# Patient Record
Sex: Male | Born: 1940 | Race: White | Hispanic: No | Smoking: Current every day smoker
Health system: Southern US, Community
[De-identification: ages and names within clinical notes are randomized; demographics above are authoritative.]

## PROBLEM LIST (undated history)

## (undated) DIAGNOSIS — K5790 Diverticulosis of intestine, part unspecified, without perforation or abscess without bleeding: Secondary | ICD-10-CM

## (undated) DIAGNOSIS — I10 Essential (primary) hypertension: Secondary | ICD-10-CM

## (undated) DIAGNOSIS — I509 Heart failure, unspecified: Secondary | ICD-10-CM

## (undated) DIAGNOSIS — I714 Abdominal aortic aneurysm, without rupture: Secondary | ICD-10-CM

## (undated) DIAGNOSIS — I2510 Atherosclerotic heart disease of native coronary artery without angina pectoris: Secondary | ICD-10-CM

## (undated) DIAGNOSIS — E785 Hyperlipidemia, unspecified: Secondary | ICD-10-CM

## (undated) DIAGNOSIS — J439 Emphysema, unspecified: Secondary | ICD-10-CM

## (undated) DIAGNOSIS — J1282 Pneumonia due to coronavirus disease 2019: Secondary | ICD-10-CM

## (undated) DIAGNOSIS — G4733 Obstructive sleep apnea (adult) (pediatric): Secondary | ICD-10-CM

## (undated) DIAGNOSIS — N281 Cyst of kidney, acquired: Secondary | ICD-10-CM

## (undated) DIAGNOSIS — S81802A Unspecified open wound, left lower leg, initial encounter: Secondary | ICD-10-CM

## (undated) DIAGNOSIS — I700 Atherosclerosis of aorta: Secondary | ICD-10-CM

## (undated) DIAGNOSIS — I4891 Unspecified atrial fibrillation: Secondary | ICD-10-CM

## (undated) DIAGNOSIS — I739 Peripheral vascular disease, unspecified: Secondary | ICD-10-CM

## (undated) DIAGNOSIS — J449 Chronic obstructive pulmonary disease, unspecified: Secondary | ICD-10-CM

## (undated) DIAGNOSIS — K760 Fatty (change of) liver, not elsewhere classified: Secondary | ICD-10-CM

## (undated) DIAGNOSIS — I517 Cardiomegaly: Secondary | ICD-10-CM

## (undated) DIAGNOSIS — N189 Chronic kidney disease, unspecified: Secondary | ICD-10-CM

## (undated) DIAGNOSIS — E669 Obesity, unspecified: Secondary | ICD-10-CM

## (undated) DIAGNOSIS — I7143 Infrarenal abdominal aortic aneurysm, without rupture: Secondary | ICD-10-CM

## (undated) DIAGNOSIS — C801 Malignant (primary) neoplasm, unspecified: Secondary | ICD-10-CM

## (undated) DIAGNOSIS — Z7901 Long term (current) use of anticoagulants: Secondary | ICD-10-CM

## (undated) HISTORY — DX: Unspecified atrial fibrillation: I48.91

## (undated) HISTORY — PX: APPENDECTOMY: SHX54

## (undated) HISTORY — DX: Heart failure, unspecified: I50.9

## (undated) HISTORY — DX: Obstructive sleep apnea (adult) (pediatric): G47.33

## (undated) HISTORY — DX: Essential (primary) hypertension: I10

## (undated) HISTORY — DX: Pneumonia due to coronavirus disease 2019: J12.82

## (undated) HISTORY — DX: Emphysema, unspecified: J43.9

## (undated) HISTORY — DX: Obesity, unspecified: E66.9

## (undated) HISTORY — DX: Unspecified open wound, left lower leg, initial encounter: S81.802A

## (undated) HISTORY — PX: OTHER SURGICAL HISTORY: SHX169

---

## 2014-08-02 ENCOUNTER — Ambulatory Visit: Payer: Self-pay | Admitting: Podiatry

## 2014-08-04 ENCOUNTER — Ambulatory Visit (INDEPENDENT_AMBULATORY_CARE_PROVIDER_SITE_OTHER): Payer: Medicare Other

## 2014-08-04 ENCOUNTER — Ambulatory Visit (INDEPENDENT_AMBULATORY_CARE_PROVIDER_SITE_OTHER): Payer: Medicare Other | Admitting: Podiatry

## 2014-08-04 ENCOUNTER — Encounter: Payer: Self-pay | Admitting: Podiatry

## 2014-08-04 VITALS — BP 132/61 | HR 70 | Resp 18 | Ht 74.0 in | Wt 300.0 lb

## 2014-08-04 DIAGNOSIS — M2041 Other hammer toe(s) (acquired), right foot: Secondary | ICD-10-CM

## 2014-08-04 DIAGNOSIS — Q828 Other specified congenital malformations of skin: Secondary | ICD-10-CM

## 2014-08-04 DIAGNOSIS — M2040 Other hammer toe(s) (acquired), unspecified foot: Secondary | ICD-10-CM

## 2014-08-04 NOTE — Progress Notes (Signed)
   Subjective:    Patient ID: Stanley Marks, male    DOB: 07/05/1941, 73 y.o.   MRN: [id number]  HPI Comments: Callus on the bottom of the right foot, it has been there for years. It is has been cut on several occasions. It is very sore and would like it cut out for good . Right plantar 4th met.       Review of Systems  Cardiovascular: Positive for leg swelling.  All other systems reviewed and are negative.      Objective:   Physical Exam: I have reviewed his past medical history medications allergies surgeries social history and review of systems. Pulses are strongly palpable bilateral. Neurologic sensorium is intact per since once the monofilament. Bilateral pitting edema is noted. Deep tendon reflexes are intact and muscle strength is 5 over 5 dorsiflexors plantar flexors inverters everters all intrinsic musculature is intact. Orthopedic evaluation demonstrates all joints distal to the ankle a full range of motion without crepitation. All flexible hammertoe deformities bilateral. Radiographic evaluation doesn't demonstrate any type of osseous abnormalities here. Cutaneous evaluation does demonstrate a reactive porokeratosis sub-fourth metatarsal head right foot. Whitley painful palpation this is not verrucoid nature.        Assessment & Plan:  Assessment: Porokeratosis solitary right foot.  Plan: Manual debridement as well as chemical debridement followup with him in 6 weeks

## 2014-08-24 DIAGNOSIS — C4491 Basal cell carcinoma of skin, unspecified: Secondary | ICD-10-CM

## 2014-08-24 HISTORY — DX: Basal cell carcinoma of skin, unspecified: C44.91

## 2014-09-15 ENCOUNTER — Encounter: Payer: Self-pay | Admitting: Podiatry

## 2014-09-15 ENCOUNTER — Ambulatory Visit (INDEPENDENT_AMBULATORY_CARE_PROVIDER_SITE_OTHER): Payer: Medicare Other | Admitting: Podiatry

## 2014-09-15 VITALS — BP 120/60 | HR 70 | Resp 16

## 2014-09-15 DIAGNOSIS — D2370 Other benign neoplasm of skin of unspecified lower limb, including hip: Secondary | ICD-10-CM

## 2014-09-15 DIAGNOSIS — Q828 Other specified congenital malformations of skin: Secondary | ICD-10-CM

## 2014-09-15 NOTE — Progress Notes (Signed)
He presents today for followup of a porokeratotic lesion plantar aspect of the right foot.  Objective: Vital signs are stable he is alert and oriented x3. Pulses are palpable. Porokeratotic lesion plantar aspect fourth metatarsal head of the right foot.  Assessment: Porokeratosis right foot.  Plan: Mechanical debridement and then we applied salicylic acid under occlusion for chemical debridement I will followup with him in a few weeks.

## 2014-11-15 ENCOUNTER — Ambulatory Visit (INDEPENDENT_AMBULATORY_CARE_PROVIDER_SITE_OTHER): Payer: Medicare Other | Admitting: Podiatry

## 2014-11-15 VITALS — BP 96/63 | HR 100 | Resp 16

## 2014-11-15 DIAGNOSIS — Q828 Other specified congenital malformations of skin: Secondary | ICD-10-CM

## 2014-11-15 DIAGNOSIS — D239 Other benign neoplasm of skin, unspecified: Secondary | ICD-10-CM

## 2014-11-15 NOTE — Progress Notes (Signed)
He presents today with chief complaint of painful lesion plantar aspect of the right foot. He states this seems to be getting much better.  Objective: Vital signs are stable he is alert and oriented 3. No erythema or edema cellulitis drainage or odor. Reactive hyperkeratosis of third metatarsophalangeal joint appears to be resolving nicely. No signs of infection.  Assessment: Well-healing porokeratotic lesion plantar aspect right foot.  Plan: Debrided reactive hyperkeratosis mechanically today also applied under occlusion salicylic acid to be left on for 3 days without washing off and then wash off thoroughly should signs of infection develop he will notify us immediately otherwise I will follow-up with him in 6-8 weeks.

## 2014-12-08 DIAGNOSIS — Z85828 Personal history of other malignant neoplasm of skin: Secondary | ICD-10-CM | POA: Insufficient documentation

## 2015-01-17 ENCOUNTER — Encounter: Payer: Self-pay | Admitting: Podiatry

## 2015-01-17 ENCOUNTER — Ambulatory Visit (INDEPENDENT_AMBULATORY_CARE_PROVIDER_SITE_OTHER): Payer: Medicare Other | Admitting: Podiatry

## 2015-01-17 VITALS — BP 144/86 | HR 70 | Resp 12

## 2015-01-17 DIAGNOSIS — Q828 Other specified congenital malformations of skin: Secondary | ICD-10-CM | POA: Diagnosis not present

## 2015-01-17 DIAGNOSIS — M2041 Other hammer toe(s) (acquired), right foot: Secondary | ICD-10-CM | POA: Diagnosis not present

## 2015-01-17 DIAGNOSIS — C4492 Squamous cell carcinoma of skin, unspecified: Secondary | ICD-10-CM

## 2015-01-17 HISTORY — DX: Squamous cell carcinoma of skin, unspecified: C44.92

## 2015-01-17 NOTE — Progress Notes (Signed)
He presents today for follow-up of the painful porokeratosis plantar aspect of the forefoot right. He states it is doing much better.  Objective: Vital signs are stable he is alert and oriented 3. Pulses are palpable. Porokeratotic lesion appears to be healing appropriately. Mild reactive hyperkeratosis.  Assessment: Porokeratosis plantar aspect right foot.  Plan: Debridement of reactive hyperkeratosis with debridement as well as chemical destruction he was given both oral and written home-going instructions for the care of this chemical application I will follow-up with him in a few months if necessary.

## 2015-08-06 ENCOUNTER — Emergency Department
Admission: EM | Admit: 2015-08-06 | Discharge: 2015-08-07 | Disposition: A | Discharge status: Home / Self Care | Payer: Medicare Other | Attending: Emergency Medicine | Admitting: Emergency Medicine

## 2015-08-06 ENCOUNTER — Encounter: Payer: Self-pay | Admitting: Emergency Medicine

## 2015-08-06 DIAGNOSIS — I10 Essential (primary) hypertension: Secondary | ICD-10-CM | POA: Insufficient documentation

## 2015-08-06 DIAGNOSIS — R2243 Localized swelling, mass and lump, lower limb, bilateral: Secondary | ICD-10-CM | POA: Diagnosis present

## 2015-08-06 DIAGNOSIS — R238 Other skin changes: Secondary | ICD-10-CM

## 2015-08-06 DIAGNOSIS — E119 Type 2 diabetes mellitus without complications: Secondary | ICD-10-CM | POA: Insufficient documentation

## 2015-08-06 DIAGNOSIS — L909 Atrophic disorder of skin, unspecified: Secondary | ICD-10-CM

## 2015-08-06 DIAGNOSIS — Z720 Tobacco use: Secondary | ICD-10-CM | POA: Insufficient documentation

## 2015-08-06 DIAGNOSIS — Z79899 Other long term (current) drug therapy: Secondary | ICD-10-CM | POA: Diagnosis not present

## 2015-08-06 DIAGNOSIS — I878 Other specified disorders of veins: Secondary | ICD-10-CM | POA: Insufficient documentation

## 2015-08-06 HISTORY — DX: Peripheral vascular disease, unspecified: I73.9

## 2015-08-06 NOTE — ED Notes (Signed)
Patient presents to the ED with left leg pain and weeping blisters on lower left leg.  Patient has a dressing covering blisters at this time.  Patient denies any injury to left leg.

## 2015-08-07 ENCOUNTER — Encounter: Payer: Self-pay | Admitting: Emergency Medicine

## 2015-08-07 DIAGNOSIS — I878 Other specified disorders of veins: Secondary | ICD-10-CM | POA: Diagnosis not present

## 2015-08-07 LAB — CBC WITH DIFFERENTIAL/PLATELET
BASOS ABS: 0.1 10*3/uL (ref 0–0.1)
Basophils Relative: 1 %
EOS PCT: 2 %
Eosinophils Absolute: 0.2 10*3/uL (ref 0–0.7)
HCT: 45.8 % (ref 40.0–52.0)
Hemoglobin: 15.1 g/dL (ref 13.0–18.0)
LYMPHS ABS: 1.5 10*3/uL (ref 1.0–3.6)
LYMPHS PCT: 15 %
MCH: 28.7 pg (ref 26.0–34.0)
MCHC: 32.9 g/dL (ref 32.0–36.0)
MCV: 87.3 fL (ref 80.0–100.0)
MONO ABS: 0.5 10*3/uL (ref 0.2–1.0)
Monocytes Relative: 6 %
Neutro Abs: 7.5 10*3/uL — ABNORMAL HIGH (ref 1.4–6.5)
Neutrophils Relative %: 76 %
PLATELETS: 181 10*3/uL (ref 150–440)
RBC: 5.25 MIL/uL (ref 4.40–5.90)
RDW: 16 % — AB (ref 11.5–14.5)
WBC: 9.8 10*3/uL (ref 3.8–10.6)

## 2015-08-07 LAB — COMPREHENSIVE METABOLIC PANEL
ALBUMIN: 3.4 g/dL — AB (ref 3.5–5.0)
ALT: 23 U/L (ref 17–63)
AST: 23 U/L (ref 15–41)
Alkaline Phosphatase: 65 U/L (ref 38–126)
Anion gap: 9 (ref 5–15)
BILIRUBIN TOTAL: 0.7 mg/dL (ref 0.3–1.2)
BUN: 18 mg/dL (ref 6–20)
CO2: 29 mmol/L (ref 22–32)
CREATININE: 1.17 mg/dL (ref 0.61–1.24)
Calcium: 8.9 mg/dL (ref 8.9–10.3)
Chloride: 104 mmol/L (ref 101–111)
GFR calc Af Amer: >60 mL/min (ref >60)
GLUCOSE: 115 mg/dL — AB (ref 65–99)
POTASSIUM: 2.8 mmol/L — AB (ref 3.5–5.1)
Sodium: 142 mmol/L (ref 135–145)
TOTAL PROTEIN: 6.6 g/dL (ref 6.5–8.1)

## 2015-08-07 MED ORDER — POTASSIUM CHLORIDE CRYS ER 20 MEQ PO TBCR
EXTENDED_RELEASE_TABLET | ORAL | Status: AC
  Administered 2015-08-07: 40 meq via ORAL
  Filled 2015-08-07: qty 2

## 2015-08-07 MED ORDER — ZINC OXIDE 12.8 % EX OINT
TOPICAL_OINTMENT | CUTANEOUS | Status: DC | PRN
  Filled 2015-08-07: qty 56.7

## 2015-08-07 MED ORDER — POTASSIUM CHLORIDE CRYS ER 20 MEQ PO TBCR
40.0000 meq | EXTENDED_RELEASE_TABLET | Freq: Once | ORAL | Status: AC
  Administered 2015-08-07: 40 meq via ORAL

## 2015-08-07 NOTE — ED Notes (Signed)
Pt and spouse verbalize understanding of una boot care and follow up.

## 2015-08-07 NOTE — ED Provider Notes (Signed)
[facility] Emergency Department Provider Note     ____________________________________________  Time seen: Approximately 12:55 AM  I have reviewed the triage vital signs and the nursing notes.   HISTORY  Chief Complaint Leg Pain    HPI Stanley Marks is a 74 y.o. male who reports his legs have been swelling for many years. Left leg began to have skin breakdown oozing couple days ago. His wife wrapped it up after covering with Vaseline. He reports is somewhat painful. He's been raising his legs. But he's run out of his Prilosec lactone and Bumex. Does not have anybody to get them refilled. Patient sees Dr. Brynda Greathouse and will follow-up with Dr. Brynda Greathouse.   Past Medical History  Diagnosis Date  . Hypertension   . Emphysema of lung   . Diabetes mellitus without complication   . PVD (peripheral vascular disease)     There are no active problems to display for this patient.   Past Surgical History  Procedure Laterality Date  . Arm surgery    . Appendectomy      Current Outpatient Rx  Name  Route  Sig  Dispense  Refill  . lisinopril (PRINIVIL,ZESTRIL) 10 MG tablet   Oral   Take 10 mg by mouth daily.         . metolazone (ZAROXOLYN) 5 MG tablet   Oral   Take 5 mg by mouth daily.         Marland Kitchen spironolactone (ALDACTONE) 25 MG tablet   Oral   Take 25 mg by mouth daily.         . bumetanide (BUMEX) 1 MG tablet   Oral   Take 1 mg by mouth daily.           Allergies Review of patient's allergies indicates no known allergies.  History reviewed. No pertinent family history.  Social History Social History  Substance Use Topics  . Smoking status: Current Every Day Smoker -- 0.00 packs/day  . Smokeless tobacco: Never Used  . Alcohol Use: No    Review of Systems Constitutional: No fever/chills Eyes: No visual changes. ENT: No sore throat. Cardiovascular: Denies chest pain. Respiratory: Denies shortness of breath. Gastrointestinal: No  abdominal pain.  No nausea, no vomiting.  No diarrhea.  No constipation. Genitourinary: Negative for dysuria. Musculoskeletal: Negative for back pain. Skin: Venous stasis changes in both legs Neurological: Negative for headaches, focal weakness or numbness.  10-point ROS otherwise negative.  ____________________________________________   PHYSICAL EXAM:  VITAL SIGNS: ED Triage Vitals  Enc Vitals Group     BP 08/06/15 2311 114/66 mmHg     Pulse Rate 08/06/15 2311 74     Resp 08/06/15 2311 24     Temp 08/06/15 2311 97.8 F (36.6 C)     Temp Source 08/06/15 2311 Oral     SpO2 08/06/15 2311 96 %     Weight 08/06/15 2311 320 lb (145.151 kg)     Height 08/06/15 2311 6\' 1"  (1.854 m)     Head Cir --      Peak Flow --      Pain Score 08/06/15 2312 4     Pain Loc --      Pain Edu? --      Excl. in [place]? --     Constitutional: Alert and oriented. Well appearing and in no acute distress. Eyes: Conjunctivae are normal. PERRL. EOMI. Head: Atraumatic. Nose: No congestion/rhinnorhea. Mouth/Throat: Mucous membranes are moist.  Oropharynx non-erythematous. Neck: No stridor. }Cardiovascular:  Normal rate, regular rhythm. Grossly normal heart sounds.  Good peripheral circulation. Respiratory: Normal respiratory effort.  No retractions. Lungs CTAB. Gastrointestinal: Soft and nontender. No distention. No abdominal bruits. No CVA tenderness. Musculoskeletal: No lower extremity tenderness nor edema.  No joint effusions. Neurologic:  Normal speech and language. No gross focal neurologic deficits are appreciated. No gait instability. Skin:  Venous stasis changes in both legs. There is breakdown of the skin with oozing of serous serous fluid from multiple spots on the left leg. There is no cellulitis leg is not warm or or redder than the other leg. Psychiatric: Mood and affect are normal. Speech and behavior are normal.  ____________________________________________   LABS (all labs ordered are  listed, but only abnormal results are displayed)  Labs Reviewed  COMPREHENSIVE METABOLIC PANEL - Abnormal; Notable for the following:    Potassium 2.8 (*)    Glucose, Bld 115 (*)    Albumin 3.4 (*)    All other components within normal limits  CBC WITH DIFFERENTIAL/PLATELET - Abnormal; Notable for the following:    RDW 16.0 (*)    Neutro Abs 7.5 (*)    All other components within normal limits   ____________________________________________  EKG   ____________________________________________  RADIOLOGY   ____________________________________________   PROCEDURES  Zinc oxide and Unna boot applied to the patient's left leg by the nurse and covered with a Ace wrap. Patient will continue to keep the leg elevated. Patient given potassium for his low potassium and his blood work. His medications were refilled. ____________________________________________   INITIAL IMPRESSION / ASSESSMENT AND PLAN / ED COURSE  Pertinent labs & imaging results that were available during my care of the patient were reviewed by me and considered in my medical decision making (see chart for details).   ____________________________________________   FINAL CLINICAL IMPRESSION(S) / ED DIAGNOSES  Final diagnoses:  Skin breakdown      Nena Polio, MD 08/07/15 [phone]

## 2015-08-07 NOTE — Discharge Instructions (Signed)
Continue to keep your leg elevated. Wear the [employer]. Follow-up with Dr. Brynda Greathouse and approximately one week. Never to come back if she develops a fever or worse leg pain. Workup there is any foul odor from the leg under the [employer]. I did not see any infection at the present time but is possible that this could develop. Start taking your Bumex and spironolactone. Be sure to follow-up with Dr. Brynda Greathouse for these as well. Her potassium was low today. Times the Bumex can give you a low potassium. This parenteral lactone exposed to help with this but it has not at present. I would eat some oranges or bananas for the next couple days to make sure he potassium stays up. Alternatively you could drink a candidate for 2-3 days of the green low-sodium V8. This has a good bit of potassium in it. I would not do this for more than 3 days. I would make sure that she follow with Dr. Brynda Greathouse and make sure that he checks potassium make sure it has not gone to high or too low. Please feel free to show him this note so he understands what I done.

## 2015-08-17 ENCOUNTER — Encounter: Payer: Medicare Other | Attending: Surgery | Admitting: Surgery

## 2015-08-17 DIAGNOSIS — I872 Venous insufficiency (chronic) (peripheral): Secondary | ICD-10-CM | POA: Diagnosis not present

## 2015-08-17 DIAGNOSIS — L97221 Non-pressure chronic ulcer of left calf limited to breakdown of skin: Secondary | ICD-10-CM | POA: Diagnosis present

## 2015-08-17 DIAGNOSIS — J449 Chronic obstructive pulmonary disease, unspecified: Secondary | ICD-10-CM | POA: Diagnosis not present

## 2015-08-17 DIAGNOSIS — I83222 Varicose veins of left lower extremity with both ulcer of calf and inflammation: Secondary | ICD-10-CM | POA: Diagnosis not present

## 2015-08-17 DIAGNOSIS — F17200 Nicotine dependence, unspecified, uncomplicated: Secondary | ICD-10-CM | POA: Diagnosis not present

## 2015-08-17 DIAGNOSIS — M868X6 Other osteomyelitis, lower leg: Secondary | ICD-10-CM | POA: Diagnosis not present

## 2015-08-18 NOTE — Progress Notes (Signed)
Stanley Marks, Stanley Marks ([id number]) Visit Report for 08/17/2015 Chief Complaint Document Details Patient Name: Stanley Marks, Stanley Marks Date of Service: 08/17/2015 10:15 AM Medical Record Number: [id number] Patient Account Number: [id number] Date of Birth/Sex: 1941/12/07 (74 y.o. Male) Treating RN: Cornell Barman Primary Care Physician: Nicky Pugh Other Clinician: Referring Physician: Nicky Pugh Treating Physician/Extender: BURNS III, Charlean Sanfilippo in Treatment: 0 Information Obtained from: Patient Chief Complaint Left calf ulcerations. Electronic Signature(s) Signed: 08/17/2015 4:02:17 PM By: Loletha Grayer MD Entered By: Loletha Grayer on 08/17/2015 13:41:13 Stanley Marks ([id number]) -------------------------------------------------------------------------------- HPI Details Patient Name: Stanley Marks Date of Service: 08/17/2015 10:15 AM Medical Record Number: [id number] Patient Account Number: [id number] Date of Birth/Sex: [birth year] (74 y.o. Male) Treating RN: Cornell Barman Primary Care Physician: Nicky Pugh Other Clinician: Referring Physician: Nicky Pugh Treating Physician/Extender: BURNS III, WALTER Weeks in Treatment: 0 History of Present Illness HPI Description: Pleasant 74 year old with history of chronic venous insufficiency and hypertension. No History of diabetes or peripheral arterial disease. He says that he stopped taking his "fluid pill" recently so that he wouldn't have to urinate as often. He subsequently developed bilateral lower extremity swelling and left calf ulcerations in late August 2016. Status post bilateral lower extremity endovenous ablation over 5 years ago in [place]. He does not regularly wear compression stockings. Denies any history of DVT. He is ambulating per his baseline. No claudication or rest pain. Not performing any dressing changes. No antibiotics. No fever or chills. Minimal drainage. Electronic Signature(s) Signed: 08/17/2015 4:02:17 PM By: Loletha Grayer MD Entered By: Loletha Grayer on 08/17/2015 13:43:54 Stanley Marks ([id number]) -------------------------------------------------------------------------------- Physical Exam Details Patient Name: Stanley Marks Date of Service: 08/17/2015 10:15 AM Medical Record Number: [id number] Patient Account Number: [id number] Date of Birth/Sex: [birth year] (74 y.o. Male) Treating RN: Cornell Barman Primary Care Physician: Nicky Pugh Other Clinician: Referring Physician: Nicky Pugh Treating Physician/Extender: BURNS III, WALTER Weeks in Treatment: 0 Constitutional . Pulse regular. Respirations normal and unlabored. Afebrile. Marland Kitchen Respiratory WNL. No retractions.. Cardiovascular Pedal Pulses WNL. Integumentary (Hair, Skin) .Marland Kitchen Neurological Sensation normal to touch, pin,and vibration. Psychiatric Judgement and insight Intact.. Oriented times 3.. No evidence of depression, anxiety, or agitation.. Notes Left calf ulcerations. Full-thickness. Biofilm wiped free with gauze. Healthy underlying, bleeding granulation tissue. No debridement required. Minimal surrounding erythema. 1+ pitting edema. 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 Palpable DP bilaterally. Left ABI not obtained secondary to painful ulcerations. Right ABI 1.37. Electronic Signature(s) Signed: 08/17/2015 4:02:17 PM By: Loletha Grayer MD Entered By: Loletha Grayer on 08/17/2015 13:45:26 Stanley Marks ([id number]) -------------------------------------------------------------------------------- Physician Orders Details Patient Name: Stanley Marks Date of Service: 08/17/2015 10:15 AM Medical Record Number: [id number] Patient Account Number: [id number] Date of Birth/Sex: Jun 18, 1941 (74 y.o. Male) Treating RN: Cornell Barman Primary Care Physician: Nicky Pugh Other Clinician: Referring Physician: Nicky Pugh Treating Physician/Extender: BURNS III, Charlean Sanfilippo in Treatment: 0 Verbal / Phone Orders:  Yes Clinician: Cornell Barman Read Back and Verified: Yes Diagnosis Coding Wound Cleansing Wound #1 Left,Anterior Lower Leg o Clean wound with Normal Saline. Wound #2 Left,Posterior Lower Leg o Clean wound with Normal Saline. Anesthetic Wound #1 Left,Anterior Lower Leg o Topical Lidocaine 4% cream applied to wound bed prior to debridement Wound #2 Left,Posterior Lower Leg o Topical Lidocaine 4% cream applied to wound bed prior to debridement Primary Wound Dressing Wound #1 Left,Anterior Lower Leg o Prisma Ag Wound #2 Left,Posterior Lower Leg o Prisma Ag Secondary Dressing Wound #1 Left,Anterior Lower Leg o ABD pad Wound #2 Left,Posterior Lower Leg o  ABD pad Dressing Change Frequency Wound #1 Left,Anterior Lower Leg o Change dressing every week Wound #2 Left,Posterior Lower Leg o Change dressing every week o Other: - Nurse visit Friday or Monday Stanley Marks, Stanley Marks ([id number]) Follow-up Appointments Wound #1 Left,Anterior Lower Leg o Return Appointment in 1 week. Wound #2 Left,Posterior Lower Leg o Return Appointment in 1 week. Edema Control Wound #1 Left,Anterior Lower Leg o 2 Layer Lite Compression System - Left Lower Extremity Wound #2 Left,Posterior Lower Leg o 2 Layer Lite Compression System - Left Lower Extremity Electronic Signature(s) Signed: 08/17/2015 4:02:17 PM By: Loletha Grayer MD Signed: 08/17/2015 4:22:57 PM By: Gretta Cool RN, BSN, Kim RN, BSN Entered By: Gretta Cool, RN, BSN, Kim on 08/17/2015 11:12:41 Stanley Marks ([id number]) -------------------------------------------------------------------------------- Problem List Details Patient Name: Stanley Marks Date of Service: 08/17/2015 10:15 AM Medical Record Number: [id number] Patient Account Number: [id number] Date of Birth/Sex: [birth year] (74 y.o. 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 Male) Treating RN: Cornell Barman Primary Care Physician: Nicky Pugh Other Clinician: Referring Physician: Nicky Pugh Treating Physician/Extender: BURNS III, Charlean Sanfilippo in Treatment: 0 Subjective Chief Complaint Information obtained from Patient Left calf ulcerations. History of Present Illness (HPI) Pleasant 74 year old with history of chronic venous insufficiency and hypertension. No History of diabetes or peripheral arterial disease. He says that he stopped taking his "fluid pill" recently so that he wouldn't have to urinate as often. He subsequently developed bilateral lower extremity swelling and left calf ulcerations in late August 2016. Status post bilateral lower extremity endovenous ablation over 5 years ago in [place]. He does not regularly wear compression stockings. Denies any history of DVT. He is ambulating per his baseline. No claudication or rest pain. Not performing any dressing changes. No antibiotics. No fever or chills. Minimal drainage. Wound History Patient presents with 2 open wounds that have been present for approximately August 27. Patient has been treating wounds in the  following manner: vasaline, peroxide. Laboratory tests have not been performed in the last month. Patient reportedly has not tested positive for an antibiotic resistant organism. Patient reportedly has not tested positive for osteomyelitis. Patient reportedly has had testing performed to evaluate circulation in the legs. Patient History Information obtained from Patient. Allergies No Known Drug Allergies Family History Heart Disease - Mother, Siblings, Hypertension - Mother, Siblings, No family history of Cancer, Diabetes, Kidney Disease, Lung Disease, Seizures, Stroke, Thyroid Problems. Social History Current every day smoker, Marital Status - Married, Alcohol Use - Never, Drug Use - No History, Caffeine Use - Daily. Medical History HIAWATHA, MERRIOTT ([id number]) Eyes Patient has history of Cataracts Denies history of Glaucoma, Optic Neuritis Ear/Nose/Mouth/Throat Denies history of Chronic sinus problems/congestion Hematologic/Lymphatic Denies history of Anemia, Hemophilia, Human Immunodeficiency Virus, Lymphedema, Sickle Cell Disease Respiratory Patient has history of Chronic Obstructive Pulmonary Disease (COPD) Denies history of Aspiration, Asthma, Pneumothorax, Sleep Apnea, Tuberculosis Cardiovascular Patient has history of Peripheral Venous  Disease Denies history of Angina, Arrhythmia, Congestive Heart Failure, Coronary Artery Disease, Deep Vein Thrombosis, Hypertension, Hypotension, Myocardial Infarction, Peripheral Arterial Disease, Phlebitis, Vasculitis Gastrointestinal Denies history of Cirrhosis , Colitis, Crohn s Endocrine Denies history of Type I Diabetes Genitourinary Denies history of End Stage Renal Disease Immunological Denies history of Lupus Erythematosus, Raynaud s, Scleroderma Integumentary (Skin) Denies history of History of Burn, History of pressure wounds Musculoskeletal Patient has history of Osteomyelitis - right shin Denies history of Gout, Rheumatoid  Arthritis, Osteoarthritis Neurologic Denies history of Dementia, Neuropathy, Quadriplegia, Paraplegia, Seizure Disorder Oncologic Denies history of Received Chemotherapy, Received Radiation Psychiatric Denies history of Anorexia/bulimia, Confinement Anxiety Medical And Surgical History Notes Constitutional Symptoms (General Health) Fluid swelling; HTN medicarion in past; appendicitis; hepatitis Oncologic Skin Cancer Review of Systems (ROS) Constitutional Symptoms (General Health) The patient has no complaints or symptoms. Eyes The patient has no complaints or symptoms. Ear/Nose/Mouth/Throat The patient has no complaints or symptoms. Hematologic/Lymphatic The patient has no complaints or symptoms. Respiratory Stanley Marks, Stanley Marks ([id number]) Complains or has symptoms of Shortness of Breath. Cardiovascular The patient has no complaints or symptoms. Gastrointestinal The patient has no complaints or symptoms. Endocrine The patient has no complaints or symptoms. Genitourinary The patient has no complaints or symptoms. Immunological The patient has no complaints or symptoms. Integumentary (Skin) Complains or has symptoms of Wounds, Bleeding or bruising tendency, Swelling. Denies complaints or symptoms of Breakdown. Musculoskeletal The patient has no complaints or symptoms. Neurologic The patient has no complaints or symptoms. Oncologic The patient has no complaints or symptoms. Psychiatric The patient has no complaints or symptoms. Objective Constitutional Pulse regular. Respirations normal and unlabored. Afebrile. Vitals Time Taken: 10:24 AM, Height: 73 in, Source: Stated, Weight: 314.5 lbs, Source: Measured, BMI: 41.5, Temperature: 97.7 F, Pulse: 64 bpm, Respiratory Rate: 18 breaths/min, Blood Pressure: 146/77 mmHg. Respiratory WNL. No retractions.. Cardiovascular Pedal Pulses WNL. Neurological Sensation normal to touch, pin,and vibration. Psychiatric Judgement and  insight Intact.. Oriented times 3.. No evidence of depression, anxiety, or agitation.Stanley Marks, Stanley Marks ([id number]) General Notes: Left calf ulcerations. Full-thickness. Biofilm wiped free with gauze. Healthy underlying, bleeding granulation tissue. No debridement required. Minimal surrounding erythema. 1+ pitting edema. 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 Palpable DP bilaterally. Left ABI not obtained secondary to painful ulcerations. Right ABI 1.37. Integumentary (Hair, Skin) Wound #1 status is Open. Original cause of wound was Blister. The wound is located on the Left,Anterior Lower Leg. The wound measures 3cm length x 4cm width x 0.1cm depth; 9.425cm^2 area and 0.942cm^3 volume. The wound is limited to skin breakdown. There is a small amount of serous drainage noted. The wound margin is indistinct and nonvisible. There is medium (34-66%) red granulation within the wound bed. There is a small (1-33%) amount of necrotic tissue within the wound bed including Eschar. The periwound skin appearance exhibited: Moist. The periwound skin appearance did not exhibit: Callus, Crepitus, Excoriation, Fluctuance, Friable, Induration, Localized Edema, Rash, Scarring, Dry/Scaly, Maceration, Atrophie Blanche, Cyanosis, Ecchymosis, Hemosiderin Staining, Mottled, Pallor, Rubor, Erythema. Wound #2 status is Open. Original cause of wound was Blister. The wound is located on the Left,Posterior Lower Leg. The wound measures 4.5cm length x 3.5cm width x 0.1cm depth; 12.37cm^2 area and 1.237cm^3 volume. The wound is limited to skin breakdown. There is a large amount of serous drainage noted. The wound margin is indistinct and nonvisible. There is small (1-33%) red granulation within the wound bed. There is no necrotic tissue within the wound bed. The periwound skin appearance exhibited: Moist. The  periwound skin appearance did not exhibit: Callus, Crepitus, Excoriation, Fluctuance,  Friable, Induration, Localized Edema, Rash, Scarring, Dry/Scaly, Maceration, Atrophie Blanche, Cyanosis, Ecchymosis, Hemosiderin Staining, Mottled, Pallor, Rubor, Erythema. 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 Plan Wound Cleansing: Wound #1 Left,Anterior Lower Leg: Stanley Marks, Stanley Marks ([id number]) Clean wound with Normal Saline. Wound #2 Left,Posterior Lower Leg: Clean wound with Normal Saline. Anesthetic: Wound #1 Left,Anterior Lower Leg: Topical Lidocaine 4% cream applied to wound bed prior to debridement Wound #2 Left,Posterior Lower Leg: Topical Lidocaine 4% cream applied to wound bed prior to debridement Primary Wound Dressing: Wound #1 Left,Anterior Lower Leg: Prisma Ag Wound #2 Left,Posterior Lower Leg: Prisma Ag Secondary Dressing: Wound #1 Left,Anterior Lower Leg: ABD pad Wound #2 Left,Posterior Lower Leg: ABD pad Dressing Change Frequency: Wound #1 Left,Anterior Lower Leg: Change dressing every week Wound #2 Left,Posterior Lower Leg: Change dressing every week Other: - Nurse visit Friday or Monday Follow-up Appointments: Wound #1 Left,Anterior Lower Leg: Return Appointment in 1 week. Wound #2 Left,Posterior Lower Leg: Return Appointment in 1 week. Edema Control: Wound #1 Left,Anterior Lower Leg: 2 Layer Lite Compression System - Left Lower Extremity Wound #2 Left,Posterior Lower Leg: 2 Layer Lite Compression System - Left Lower Extremity Prisma. 2 layer Coban light compression bandage. Return to clinic in 3-4 days. Electronic Signature(s) Signed: 08/17/2015 4:02:17 PM By: Loletha Grayer MD Entered By: Loletha Grayer on 08/17/2015 13:47:35 Stanley Marks ([id number]) -------------------------------------------------------------------------------- ROS/PFSH  Details Patient Name: Stanley Marks Date of Service: 08/17/2015 10:15 AM Medical Record Number: [id number] Patient Account Number: [id number] Date of Birth/Sex: 02-06-1941 (74 y.o. Male) Treating RN: Cornell Barman Primary Care Physician: Nicky Pugh Other Clinician: Referring Physician: Nicky Pugh Treating Physician/Extender: BURNS III, WALTER Weeks in Treatment: 0 Label Progress Note Print Version as History and Physical for this encounter Information Obtained From Patient Wound History Do you currently have one or more open woundso Yes How many open wounds do you currently haveo 2 Approximately how long have you had your woundso August 27 How have you been treating your wound(s) until nowo vasaline, peroxide Has your wound(s) ever healed and then re-openedo No Have you had any lab work done in the past montho No Have you tested positive for an antibiotic resistant organism (MRSA, VRE)o No Have you tested positive for osteomyelitis (bone infection)o No Have you had any tests for circulation on your legso Yes Who ordered the testo 6-7 years ago Where was the test doneo [place] Respiratory Complaints and Symptoms: Positive for: Shortness of Breath Medical History: Positive for: Chronic Obstructive Pulmonary Disease (COPD) Negative for: Aspiration; Asthma; Pneumothorax; Sleep Apnea; Tuberculosis Gastrointestinal Complaints and Symptoms: No Complaints or Symptoms Complaints and Symptoms: Negative for: Frequent diarrhea; Nausea; Vomiting Medical History: Negative for: Cirrhosis ; Colitis; Crohnos Endocrine Complaints and Symptoms: No Complaints or Symptoms Complaints and Symptoms: Stanley Marks, Stanley Marks ([id number]) Negative for: Hepatitis; Thyroid disease; Polydypsia (Excessive Thirst) Medical History: Negative for: Type I Diabetes Genitourinary Complaints and Symptoms: No Complaints or Symptoms Complaints and Symptoms: Negative for: Kidney failure/ Dialysis;  Incontinence/dribbling Medical History: Negative for: End Stage Renal Disease Immunological Complaints and Symptoms: No Complaints or Symptoms Complaints and Symptoms: Negative for: Hives; Itching Medical History: Negative for: Lupus Erythematosus; Raynaudos; Scleroderma Integumentary (Skin) Complaints and Symptoms: Positive for: Wounds; Bleeding or bruising tendency; Swelling Negative for: Breakdown Medical History: Negative for: History of Burn; History of  pressure wounds Constitutional Symptoms (General Health) Complaints and Symptoms: No Complaints or Symptoms Medical History: Past Medical History Notes: Fluid swelling; HTN medicarion in past; appendicitis; hepatitis Eyes Complaints and Symptoms: No Complaints or Symptoms Medical History: Positive for: Cataracts Stanley Marks, Stanley Marks ([id number]) Negative for: Glaucoma; Optic Neuritis Ear/Nose/Mouth/Throat Complaints and Symptoms: No Complaints or Symptoms Medical History: Negative for: Chronic sinus problems/congestion Hematologic/Lymphatic Complaints and Symptoms: No Complaints or Symptoms Medical History: Negative for: Anemia; Hemophilia; Human Immunodeficiency Virus; Lymphedema; Sickle Cell Disease Cardiovascular Complaints and Symptoms: No Complaints or Symptoms Medical History: Positive for: Peripheral Venous Disease Negative for: Angina; Arrhythmia; Congestive Heart Failure; Coronary Artery Disease; Deep Vein Thrombosis; Hypertension; Hypotension; Myocardial Infarction; Peripheral Arterial Disease; Phlebitis; Vasculitis Musculoskeletal Complaints and Symptoms: No Complaints or Symptoms Medical History: Positive for: Osteomyelitis - right shin Negative for: Gout; Rheumatoid Arthritis; Osteoarthritis Neurologic Complaints and Symptoms: No Complaints or Symptoms Medical History: Negative for: Dementia; Neuropathy; Quadriplegia; Paraplegia; Seizure Disorder Oncologic Complaints and Symptoms: No Complaints or  Symptoms Medical HistoryMarland Kitchen Stanley Marks, Stanley Marks ([id number]) Negative for: Received Chemotherapy; Received Radiation Past Medical History Notes: Skin Cancer Psychiatric Complaints and Symptoms: No Complaints or Symptoms Medical History: Negative for: Anorexia/bulimia; Confinement Anxiety HBO Extended History Items Eyes: Cataracts Family and Social History Cancer: No; Diabetes: No; Heart Disease: Yes - Mother, Siblings; Hypertension: Yes - Mother, Siblings; Kidney Disease: No; Lung Disease: No; Seizures: No; Stroke: No; Thyroid Problems: No; Current every day smoker; Marital Status - Married; Alcohol Use: Never; Drug Use: No History; Caffeine Use: Daily; Advanced Directives: No; Patient does not want information on Advanced Directives; Do not resuscitate: No; Living Will: No; Medical Power of Attorney: No Physician Affirmation I have reviewed and agree with the above information. Electronic Signature(s) Signed: 08/17/2015 4:02:17 PM By: Loletha Grayer MD Signed: 08/17/2015 4:22:57 PM By: Gretta Cool RN, BSN, Kim RN, BSN Entered By: Loletha Grayer on 08/17/2015 13:47:17 Stanley Marks ([id number]) -------------------------------------------------------------------------------- SuperBill Details Patient Name: Stanley Marks Date of Service: 08/17/2015 Medical Record Number: [id number] Patient Account Number: [id number] Date of Birth/Sex: Oct 16, 1941 (74 y.o. 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 I87.2 Venous insufficiency (chronic) (peripheral) Facility Procedures CPT4: Description Modifier Quantity Code [id number] - WOUND CARE VISIT-LEV 3 EST PT 1 CPT4: [id number] (Facility Use Only) [id number] - [facility] LWR LT 1 LEG Physician Procedures CPT4: Description Modifier Quantity Code [id number] WC PHYS LEVEL 3 o NEW PT 1 ICD-10 Description Diagnosis 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

## 2015-08-18 NOTE — Progress Notes (Signed)
DONTEA, CORLEW ([id number]) Visit Report for 08/17/2015 Abuse/Suicide Risk Screen Details Patient Name: Stanley Marks, Stanley Marks Date of Service: 08/17/2015 10:15 AM Medical Record Number: [id number] Patient Account Number: [id number] Date of Birth/Sex: [birth year] (74 y.o. Male) Treating RN: Cornell Barman Primary Care Physician: Nicky Pugh Other Clinician: Referring Physician: Nicky Pugh Treating Physician/Extender: BURNS III, WALTER Weeks in Treatment: 0 Abuse/Suicide Risk Screen Items Answer ABUSE/SUICIDE RISK SCREEN: Has anyone close to you tried to hurt or harm you recentlyo No Do you feel uncomfortable with anyone in your familyo No Has anyone forced you do things that you didnot want to doo No Do you have any thoughts of harming yourselfo No Patient displays signs or symptoms of abuse and/or neglect. No Electronic Signature(s) Signed: 08/17/2015 4:22:57 PM By: Gretta Cool, RN, BSN, Kim RN, BSN Entered By: Gretta Cool, RN, BSN, Kim on 08/17/2015 10:46:32 Stanley Marks ([id number]) -------------------------------------------------------------------------------- Activities of Daily Living Details Patient Name: Stanley Marks Date of Service: 08/17/2015 10:15 AM Medical Record Number: [id number] Patient Account Number: [id number] Date of Birth/Sex: 1941-03-08 (74 y.o. Male) Treating RN: Cornell Barman Primary Care Physician: Nicky Pugh Other Clinician: Referring Physician: Nicky Pugh Treating Physician/Extender: BURNS III, WALTER Weeks in Treatment: 0 Activities of Daily Living Items Answer Activities of Daily Living (Please select one for each item) Drive Automobile Not Able Take Medications Completely Able Use Telephone Completely Able Care for Appearance Completely Able Use Toilet Completely Able Bath / Shower Completely Able Dress Self Completely Able Feed Self Completely Able Walk Completely Able Get In / Out Bed Completely Able Housework Completely Able Prepare Meals Completely  [place] for Self Completely Able Electronic Signature(s) Signed: 08/17/2015 4:22:57 PM By: Gretta Cool, RN, BSN, Kim RN, BSN Entered By: Gretta Cool, RN, BSN, Kim on 08/17/2015 10:46:50 Stanley Marks ([id number]) -------------------------------------------------------------------------------- Education Assessment Details Patient Name: Stanley Marks Date of Service: 08/17/2015 10:15 AM Medical Record Number: [id number] Patient Account Number: [id number] Date of Birth/Sex: 1941/11/25 (74 y.o. Male) Treating RN: Cornell Barman Primary Care Physician: Nicky Pugh Other Clinician: Referring Physician: Nicky Pugh Treating Physician/Extender: BURNS III, Charlean Sanfilippo in Treatment: 0 Learning Preferences/Education Level/Primary Language Highest Education Level: High School Preferred Language: English Cognitive Barrier Assessment/Beliefs Language Barrier: No Translator Needed: No Memory Deficit: No Emotional Barrier: No Cultural/Religious Beliefs Affecting Medical No Care: Physical Barrier Assessment Impaired Vision: No Impaired Hearing: No Decreased Hand dexterity: No Knowledge/Comprehension Assessment Knowledge Level: High Comprehension Level: High Ability to understand written High instructions: Ability to understand verbal High instructions: Motivation Assessment Anxiety Level: Calm Cooperation: Cooperative Education Importance: Acknowledges Need Interest in Health Problems: Asks Questions Perception: Coherent Willingness to Engage in Self- High Management Activities: Readiness to Engage in Self- High Management Activities: Electronic Signature(s) Signed: 08/17/2015 4:22:57 PM By: Gretta Cool, RN, BSN, Kim RN, BSN [place], [place] ([id number]) Entered By: Gretta Cool, RN, BSN, Kim on 08/17/2015 10:47:23 Stanley Marks ([id number]) -------------------------------------------------------------------------------- Fall Risk Assessment Details Patient Name: Stanley Marks Date of Service: 08/17/2015 10:15 AM Medical Record Number: [id number] Patient Account Number: [id number] Date of Birth/Sex: [birth year] (74 y.o. Male) Treating RN: Cornell Barman Primary Care Physician: Nicky Pugh Other Clinician: Referring Physician: Nicky Pugh Treating Physician/Extender: BURNS III, Charlean Sanfilippo in Treatment: 0 Fall Risk Assessment Items FALL RISK ASSESSMENT: History of falling - immediate or within 3 months 0 No Secondary diagnosis 0 No Ambulatory aid None/bed rest/wheelchair/nurse 0 Yes Crutches/cane/walker 0 No Furniture 0 No IV Access/Saline Lock 0 No Gait/Training Normal/bed rest/immobile 0 Yes Weak 0 No Impaired 0 No Mental Status Oriented to own ability 0 Yes  Electronic Signature(s) Signed: 08/17/2015 4:22:57 PM By: Gretta Cool, RN, BSN, Kim RN, BSN Entered By: Gretta Cool, RN, BSN, Kim on 08/17/2015 10:47:37 Stanley Marks ([id number]) -------------------------------------------------------------------------------- Nutrition Risk Assessment Details Patient Name: Stanley Marks Date of Service: 08/17/2015 10:15 AM Medical Record Number: [id number] Patient Account Number: [id number] Date of Birth/Sex: 12-09-1941 (74 y.o. Male) Treating RN: Cornell Barman Primary Care Physician: Nicky Pugh Other Clinician: Referring Physician: Nicky Pugh Treating Physician/Extender: BURNS III, WALTER Weeks in Treatment: 0 Height (in): 73 Weight (lbs): 314.5 Body Mass Index (BMI): 41.5 Nutrition Risk Assessment Items NUTRITION RISK SCREEN: I have an illness or condition that made me change the kind and/or 0 No amount of food I eat I eat fewer than two meals per day 0 No I eat few fruits and vegetables, or milk products 0 No I have three or more drinks of beer, liquor or wine almost every day 0 No I have tooth or mouth problems that make it hard for me to eat 0 No I don't always have enough money to buy the food I need 0 No I eat alone most of the time 0 No I take  three or more different prescribed or over-the-counter drugs a 0 No day Without wanting to, I have lost or gained 10 pounds in the last six 0 No months I am not always physically able to shop, cook and/or feed myself 0 No Nutrition Protocols Good Risk Protocol 0 No interventions needed Moderate Risk Protocol Electronic Signature(s) Signed: 08/17/2015 4:22:57 PM By: Gretta Cool, RN, BSN, Kim RN, BSN Entered By: Gretta Cool, RN, BSN, Kim on 08/17/2015 10:47:45

## 2015-08-18 NOTE — Progress Notes (Signed)
Stanley Marks, Stanley Marks ([id number]) Visit Report for 08/17/2015 Allergy List Details Patient Name: Stanley Marks, Stanley Marks Date of Service: 08/17/2015 10:15 AM Medical Record Number: [id number] Patient Account Number: [id number] Date of Birth/Sex: 14-Oct-1941 (74 y.o. Male) Treating RN: Stanley Marks Primary Care Physician: Stanley Marks Other Clinician: Referring Physician: Nicky Marks Treating Physician/Extender: Stanley Marks in Treatment: 0 Allergies Active Allergies No Known Drug Allergies Allergy Notes Electronic Signature(s) Signed: 08/17/2015 4:22:57 PM By: Stanley Cool, RN, BSN, Kim RN, BSN Entered By: Stanley Cool, RN, BSN, Stanley Marks on 08/17/2015 10:40:49 Stanley Marks ([id number]) -------------------------------------------------------------------------------- Arrival Information Details Patient Name: Stanley Marks Date of Service: 08/17/2015 10:15 AM Medical Record Number: [id number] Patient Account Number: [id number] Date of Birth/Sex: Aug 21, 1941 (74 y.o. Male) Treating RN: Stanley Marks Primary Care Physician: Stanley Marks Other Clinician: Referring Physician: Nicky Marks Treating Physician/Extender: Stanley Marks in Treatment: 0 Visit Information Patient Arrived: Ambulatory Arrival Time: 10:21 Accompanied By: self Transfer Assistance: None Patient Identification Verified: Yes Secondary Verification Process Yes Completed: Patient Has Alerts: Yes Patient Alerts: Patient on Blood Thinner Electronic Signature(s) Signed: 08/17/2015 4:22:57 PM By: Stanley Cool, RN, BSN, Kim RN, BSN Entered By: Stanley Cool, RN, BSN, Stanley Marks on 08/17/2015 10:24:02 Stanley Marks ([id number]) -------------------------------------------------------------------------------- Clinic Level of Care Assessment Details Patient Name: Stanley Marks Date of Service: 08/17/2015 10:15 AM Medical Record Number: [id number] Patient Account Number: [id number] Date of Birth/Sex: 1941-09-17 (74 y.o. Male) Treating RN: Stanley Marks Primary Care  Physician: Stanley Marks Other Clinician: Referring Physician: Nicky Marks Treating Physician/Extender: Stanley Marks in Treatment: 0 Clinic Level of Care Assessment Items TOOL 1 Quantity Score []  - Use when EandM and Procedure is performed on INITIAL visit 0 ASSESSMENTS - Nursing Assessment / Reassessment X - General Physical Exam (combine w/ comprehensive assessment (listed just 1 20 below) when performed on new pt. evals) X - Comprehensive Assessment (HX, ROS, Risk Assessments, Wounds Hx, etc.) 1 25 ASSESSMENTS - Wound and Skin Assessment / Reassessment []  - Dermatologic / Skin Assessment (not related to wound area) 0 ASSESSMENTS - Ostomy and/or Continence Assessment and Care []  - Incontinence Assessment and Management 0 []  - Ostomy Care Assessment and Management (repouching, etc.) 0 PROCESS - Coordination of Care X - Simple Patient / Family Education for ongoing care 1 15 []  - Complex (extensive) Patient / Family Education for ongoing care 0 X - Staff obtains Programmer, systems, Records, Test Results / Process Orders 1 10 []  - Staff telephones HHA, Nursing Homes / Clarify orders / etc 0 []  - Routine Transfer to another Facility (non-emergent condition) 0 []  - Routine Hospital Admission (non-emergent condition) 0 X - New Admissions / Biomedical engineer / Ordering NPWT, Apligraf, etc. 1 15 []  - Emergency Hospital Admission (emergent condition) 0 PROCESS - Special Needs []  - Pediatric / Minor Patient Management 0 []  - Isolation Patient Management 0 Stanley Marks ([id number]) []  - Hearing / Language / Visual special needs 0 []  - Assessment of Community assistance (transportation, D/C planning, etc.) 0 []  - Additional assistance / Altered mentation 0 []  - Support Surface(s) Assessment (bed, cushion, seat, etc.) 0 INTERVENTIONS - Miscellaneous []  - External ear exam 0 []  - Patient Transfer (multiple staff / Civil Service fast streamer / Similar devices) 0 []  - Simple Staple / Suture  removal (25 or less) 0 []  - Complex Staple / Suture removal (26 or more) 0 []  - Hypo/Hyperglycemic Management (do not check if billed separately) 0 X - Ankle / Brachial Index (ABI) - do not check if billed separately 1 15 Has the patient been seen  at the hospital within the last three years: Yes Total Score: 100 Level Of Care: New/Established - Level 3 Electronic Signature(s) Signed: 08/17/2015 4:22:57 PM By: Stanley Cool, RN, BSN, Kim RN, BSN Entered By: Stanley Cool, RN, BSN, Stanley Marks on 08/17/2015 16:14:46 Stanley Marks ([id number]) -------------------------------------------------------------------------------- Encounter Discharge Information Details Patient Name: Stanley Marks Date of Service: 08/17/2015 10:15 AM Medical Record Number: [id number] Patient Account Number: [id number] Date of Birth/Sex: 15-Apr-1941 (74 y.o. Male) Treating RN: Stanley Marks Primary Care Physician: Stanley Marks Other Clinician: Referring Physician: Nicky Marks Treating Physician/Extender: Stanley Marks in Treatment: 0 Encounter Discharge Information Items Discharge Pain Level: 0 Discharge Condition: Stable Ambulatory Status: Ambulatory Discharge Destination: Home Transportation: Private Auto Accompanied By: self Schedule Follow-up Appointment: Yes Medication Reconciliation completed and provided to Patient/Care Yes Stanley Marks: Provided on Clinical Summary of Care: 08/17/2015 Form Type Recipient Paper Patient JB Electronic Signature(s) Signed: 08/17/2015 4:22:57 PM By: Stanley Cool, RN, BSN, Kim RN, BSN Previous Signature: 08/17/2015 11:11:22 AM Version By: Stanley Marks Entered By: Stanley Cool RN, BSN, Stanley Marks on 08/17/2015 11:19:40 Stanley Marks ([id number]) -------------------------------------------------------------------------------- Lower Extremity Assessment Details Patient Name: Stanley Marks Date of Service: 08/17/2015 10:15 AM Medical Record Number: [id number] Patient Account Number: [id number] Date of Birth/Sex:  [birth year] (74 y.o. Male) Treating RN: Stanley Marks Primary Care Physician: Stanley Marks Other Clinician: Referring Physician: Nicky Marks Treating Physician/Extender: Stanley Marks in Treatment: 0 Edema Assessment Assessed: [Left: No] [Right: No] E[Left: dema] [Right: :] Calf Left: Right: Point of Measurement: 38 cm From Medial Instep 46.2 cm cm Ankle Left: Right: Point of Measurement: 15 cm From Medial Instep 27.6 cm cm Vascular Assessment Pulses: Posterior Tibial Palpable: [Left:Yes] [Right:Yes] Dorsalis Pedis Palpable: [Left:Yes] [Right:Yes] Extremity colors, hair growth, and conditions: Extremity Color: [Left:Hyperpigmented] [Right:Hyperpigmented] Hair Growth on Extremity: [Left:No] [Right:No] Temperature of Extremity: [Left:Warm] [Right:Warm] Capillary Refill: [Left:< 3 seconds] [Right:< 3 seconds] Blood Pressure: Brachial: [Right:130] Dorsalis Pedis: [Left:Dorsalis Pedis: 176] Ankle: Posterior Tibial: [Left:Posterior Tibial: 675] [Right:1.37] Toe Nail Assessment Left: Right: Thick: Yes Yes Discolored: Yes Yes Deformed: No No Improper Length and Hygiene: No No Electronic Signature(sMEER, REINDL ([id number]) Signed: 08/17/2015 4:22:57 PM By: Stanley Cool, RN, BSN, Kim RN, BSN Entered By: Stanley Cool, RN, BSN, Stanley Marks on 08/17/2015 10:36:53 Stanley Marks ([id number]) -------------------------------------------------------------------------------- Multi Wound Chart Details Patient Name: Stanley Marks Date of Service: 08/17/2015 10:15 AM Medical Record Number: [id number] Patient Account Number: [id number] Date of Birth/Sex: Sep 09, 1941 (74 y.o. Male) Treating RN: Stanley Marks Primary Care Physician: Stanley Marks Other Clinician: Referring Physician: Nicky Marks Treating Physician/Extender: Stanley III, WALTER Weeks in Treatment: 0 Vital Signs Height(in): 73 Pulse(bpm): 64 Weight(lbs): 314.5 Blood Pressure 146/77 (mmHg): Body Mass Index(BMI): 41 Temperature(F):  97.7 Respiratory Rate 18 (breaths/min): Photos: [1:No Photos] [2:No Photos] [N/A:N/A] Wound Location: [1:Left Lower Leg - Anterior] [2:Left Lower Leg - Posterior] [N/A:N/A] Wounding Event: [1:Blister] [2:Blister] [N/A:N/A] Primary Etiology: [1:Venous Leg Ulcer] [2:Venous Leg Ulcer] [N/A:N/A] Date Acquired: [1:08/06/2015] [2:08/06/2015] [N/A:N/A] Weeks of Treatment: [1:0] [2:0] [N/A:N/A] Wound Status: [1:Open] [2:Open] [N/A:N/A] Measurements L x W x D 3x4x0.1 [2:4.5x3.5x0.1] [N/A:N/A] (cm) Area (cm) : [1:9.425] [2:12.37] [N/A:N/A] Volume (cm) : [1:0.942] [2:1.237] [N/A:N/A] % Reduction in Area: [1:0.00%] [2:-12.50%] [N/A:N/A] % Reduction in Volume: 0.00% [2:-12.50%] [N/A:N/A] Classification: [1:Partial Thickness] [2:Partial Thickness] [N/A:N/A] Exudate Amount: [1:Small] [2:Large] [N/A:N/A] Exudate Type: [1:Serous] [2:Serous] [N/A:N/A] Exudate Color: [1:amber] [2:amber] [N/A:N/A] Wound Margin: [1:Indistinct, nonvisible] [2:Indistinct, nonvisible] [N/A:N/A] Granulation Amount: [1:Medium (34-66%)] [2:Small (1-33%)] [N/A:N/A] Granulation Quality: [1:Red] [2:Red] [N/A:N/A] Necrotic Amount: [1:Small (1-33%)] [2:None Present (0%)] [N/A:N/A] Necrotic Tissue: [1:Eschar] [2:N/A] [N/A:N/A] Exposed Structures: [1:Fascia: No  Fat: No Tendon: No Muscle: No Joint: No Bone: No Limited to Skin Breakdown] [2:Fascia: No Fat: No Tendon: No Muscle: No Joint: No Bone: No Limited to Skin Breakdown] [N/A:N/A] Periwound Skin Texture: Edema: No Edema: No N/A Excoriation: No Excoriation: No Induration: No Induration: No Callus: No Callus: No Crepitus: No Crepitus: No Fluctuance: No Fluctuance: No Friable: No Friable: No Rash: No Rash: No Scarring: No Scarring: No Periwound Skin Moist: Yes Moist: Yes N/A Moisture: Maceration: No Maceration: No Dry/Scaly: No Dry/Scaly: No Periwound Skin Color: Atrophie Blanche: No Atrophie Blanche: No N/A Cyanosis: No Cyanosis: No Ecchymosis:  No Ecchymosis: No Erythema: No Erythema: No Hemosiderin Staining: No Hemosiderin Staining: No Mottled: No Mottled: No Pallor: No Pallor: No Rubor: No Rubor: No Tenderness on No No N/A Palpation: Wound Preparation: Ulcer Cleansing: Ulcer Cleansing: N/A Rinsed/Irrigated with Rinsed/Irrigated with Saline Saline Topical Anesthetic Topical Anesthetic Applied: Other: lidociane Applied: None 4% Treatment Notes Electronic Signature(s) Signed: 08/17/2015 4:22:57 PM By: Stanley Cool, RN, BSN, Kim RN, BSN Entered By: Stanley Cool, RN, BSN, Stanley Marks on 08/17/2015 10:48:56 Stanley Marks ([id number]) -------------------------------------------------------------------------------- Multi-Disciplinary Care Plan Details Patient Name: Stanley Marks Date of Service: 08/17/2015 10:15 AM Medical Record Number: [id number] Patient Account Number: [id number] Date of Birth/Sex: [birth year] (74 y.o. Male) Treating RN: Stanley Marks Primary Care Physician: Stanley Marks Other Clinician: Referring Physician: Nicky Marks Treating Physician/Extender: Stanley Marks in Treatment: 0 Active Inactive Orientation to the Wound Care Program Nursing Diagnoses: Knowledge deficit related to the wound healing center program Goals: Patient/caregiver will verbalize understanding of the [place] Program Date Initiated: 08/17/2015 Goal Status: Active Interventions: Provide education on orientation to the wound center Notes: Venous Leg Ulcer Nursing Diagnoses: Actual venous Insuffiency (use after diagnosis is confirmed) Goals: Patient will maintain optimal edema control Date Initiated: 08/17/2015 Goal Status: Active Interventions: Assess peripheral edema status every visit. Notes: Wound/Skin Impairment Nursing Diagnoses: Knowledge deficit related to smoking impact on wound healing Goals: Patient will demonstrate a reduced rate of smoking or cessation of smoking Date Initiated: 08/17/2015 ALIEU, FINNIGAN  ([id number]) Goal Status: Active Ulcer/skin breakdown will heal within 14 weeks Date Initiated: 08/17/2015 Goal Status: Active Interventions: Provide education on smoking Treatment Activities: Skin care regimen initiated : 08/17/2015 Notes: Electronic Signature(s) Signed: 08/17/2015 4:22:57 PM By: Stanley Cool, RN, BSN, Kim RN, BSN Entered By: Stanley Cool, RN, BSN, Stanley Marks on 08/17/2015 10:48:46 Stanley Marks ([id number]) -------------------------------------------------------------------------------- Pain Assessment Details Patient Name: Stanley Marks Date of Service: 08/17/2015 10:15 AM Medical Record Number: [id number] Patient Account Number: [id number] Date of Birth/Sex: 1940-12-25 (74 y.o. Male) Treating RN: Stanley Marks Primary Care Physician: Stanley Marks Other Clinician: Referring Physician: Nicky Marks Treating Physician/Extender: Stanley III, WALTER Weeks in Treatment: 0 Active Problems Location of Pain Severity and Description of Pain Patient Has Paino No Site Locations Pain Management and Medication Current Pain Management: Electronic Signature(s) Signed: 08/17/2015 4:22:57 PM By: Stanley Cool, RN, BSN, Kim RN, BSN Entered By: Stanley Cool, RN, BSN, Stanley Marks on 08/17/2015 10:24:10 Stanley Marks ([id number]) -------------------------------------------------------------------------------- Patient/Caregiver Education Details Patient Name: Stanley Marks Date of Service: 08/17/2015 10:15 AM Medical Record Number: [id number] Patient Account Number: [id number] Date of Birth/Gender: 1941/07/24 (74 y.o. Male) Treating RN: Stanley Marks Primary Care Physician: Stanley Marks Other Clinician: Referring Physician: Nicky Marks Treating Physician/Extender: Stanley Marks in Treatment: 0 Education Assessment Education Provided To: Patient Education Topics Provided Venous: Handouts: Controlling Swelling with Multilayered Compression Wraps Methods: Demonstration, Explain/Verbal Responses: State content  correctly Wound/Skin Impairment: Handouts: Caring for Your Ulcer Electronic Signature(s) Signed: 08/17/2015 4:22:57 PM By: Stanley Cool,  RN, BSN, Leisure centre manager, BSN Entered By: Stanley Cool, RN, BSN, Stanley Marks on 08/17/2015 11:20:01 Stanley Marks ([id number]) -------------------------------------------------------------------------------- Wound Assessment Details Patient Name: Stanley Marks Date of Service: 08/17/2015 10:15 AM Medical Record Number: [id number] Patient Account Number: [id number] Date of Birth/Sex: 1941/04/21 (74 y.o. Male) Treating RN: Stanley Marks Primary Care Physician: Stanley Marks Other Clinician: Referring Physician: Nicky Marks Treating Physician/Extender: Stanley III, WALTER Weeks in Treatment: 0 Wound Status Wound Number: 1 Primary Etiology: Venous Leg Ulcer Wound Location: Left Lower Leg - Anterior Wound Status: Open Wounding Event: Blister Date Acquired: 08/06/2015 Weeks Of Treatment: 0 Clustered Wound: No Photos Photo Uploaded By: Stanley Cool, RN, BSN, Stanley Marks on 08/17/2015 14:00:28 Wound Measurements Length: (cm) 3 Width: (cm) 4 Depth: (cm) 0.1 Area: (cm) 9.425 Volume: (cm) 0.942 % Reduction in Area: 0% % Reduction in Volume: 0% Wound Description Classification: Partial Thickness Wound Margin: Indistinct, nonvisible Exudate Amount: Small Exudate Type: Serous Exudate Color: amber Wound Bed Granulation Amount: Medium (34-66%) Exposed Structure Granulation Quality: Red Fascia Exposed: No Necrotic Amount: Small (1-33%) Fat Layer Exposed: No Necrotic Quality: Eschar Tendon Exposed: No RAHMON, HEIGL ([id number]) Muscle Exposed: No Joint Exposed: No Bone Exposed: No Limited to Skin Breakdown Periwound Skin Texture Texture Color No Abnormalities Noted: No No Abnormalities Noted: No Callus: No Atrophie Blanche: No Crepitus: No Cyanosis: No Excoriation: No Ecchymosis: No Fluctuance: No Erythema: No Friable: No Hemosiderin Staining: No Induration: No Mottled:  No Localized Edema: No Pallor: No Rash: No Rubor: No Scarring: No Moisture No Abnormalities Noted: No Dry / Scaly: No Maceration: No Moist: Yes Wound Preparation Ulcer Cleansing: Rinsed/Irrigated with Saline Topical Anesthetic Applied: Other: lidociane 4%, Treatment Notes Wound #1 (Left, Anterior Lower Leg) 1. Cleansed with: Clean wound with Normal Saline 2. Anesthetic Topical Lidocaine 4% cream to wound bed prior to debridement 4. Dressing Applied: Prisma Ag 5. Secondary Dressing Applied ABD Pad 7. Secured with 2 Layer Lite Compression System - Left Lower Extremity Electronic Signature(s) Signed: 08/17/2015 4:22:57 PM By: Stanley Cool, RN, BSN, Kim RN, BSN Entered By: Stanley Cool, RN, BSN, Stanley Marks on 08/17/2015 10:38:37 Stanley Marks ([id number]) -------------------------------------------------------------------------------- Wound Assessment Details Patient Name: Stanley Marks Date of Service: 08/17/2015 10:15 AM Medical Record Number: [id number] Patient Account Number: [id number] Date of Birth/Sex: [birth year] (74 y.o. Male) Treating RN: Stanley Marks Primary Care Physician: Stanley Marks Other Clinician: Referring Physician: Nicky Marks Treating Physician/Extender: Stanley III, WALTER Weeks in Treatment: 0 Wound Status Wound Number: 2 Primary Etiology: Venous Leg Ulcer Wound Location: Left Lower Leg - Posterior Wound Status: Open Wounding Event: Blister Date Acquired: 08/06/2015 Weeks Of Treatment: 0 Clustered Wound: No Photos Photo Uploaded By: Stanley Cool, RN, BSN, Stanley Marks on 08/17/2015 14:00:28 Wound Measurements Length: (cm) 4.5 Width: (cm) 3.5 Depth: (cm) 0.1 Area: (cm) 12.37 Volume: (cm) 1.237 % Reduction in Area: -12.5% % Reduction in Volume: -12.5% Wound Description Classification: Partial Thickness Wound Margin: Indistinct, nonvisible Exudate Amount: Large Exudate Type: Serous Exudate Color: amber Wound Bed Granulation Amount: Small (1-33%) Exposed  Structure Granulation Quality: Red Fascia Exposed: No Necrotic Amount: None Present (0%) Fat Layer Exposed: No Tendon Exposed: No Fulginiti, Kwaku ([id number]) Muscle Exposed: No Joint Exposed: No Bone Exposed: No Limited to Skin Breakdown Periwound Skin Texture Texture Color No Abnormalities Noted: No No Abnormalities Noted: No Callus: No Atrophie Blanche: No Crepitus: No Cyanosis: No Excoriation: No Ecchymosis: No Fluctuance: No Erythema: No Friable: No Hemosiderin Staining: No Induration: No Mottled: No Localized Edema: No Pallor: No Rash: No Rubor: No Scarring: No Moisture No Abnormalities Noted: No Dry / Scaly: No  Maceration: No Moist: Yes Wound Preparation Ulcer Cleansing: Rinsed/Irrigated with Saline Topical Anesthetic Applied: None Treatment Notes Wound #2 (Left, Posterior Lower Leg) 1. Cleansed with: Clean wound with Normal Saline 2. Anesthetic Topical Lidocaine 4% cream to wound bed prior to debridement 4. Dressing Applied: Prisma Ag 5. Secondary Dressing Applied ABD Pad 7. Secured with 2 Layer Lite Compression System - Left Lower Extremity Electronic Signature(s) Signed: 08/17/2015 4:22:57 PM By: Stanley Cool, RN, BSN, Kim RN, BSN Entered By: Stanley Cool, RN, BSN, Stanley Marks on 08/17/2015 10:40:35 Stanley Marks ([id number]) -------------------------------------------------------------------------------- [place] Details Patient Name: Stanley Marks Date of Service: 08/17/2015 10:15 AM Medical Record Number: [id number] Patient Account Number: [id number] Date of Birth/Sex: 07-08-1941 (74 y.o. Male) Treating RN: Stanley Marks Primary Care Physician: Stanley Marks Other Clinician: Referring Physician: Nicky Marks Treating Physician/Extender: Stanley III, WALTER Weeks in Treatment: 0 Vital Signs Time Taken: 10:24 Temperature (F): 97.7 Height (in): 73 Pulse (bpm): 64 Source: Stated Respiratory Rate (breaths/min): 18 Weight (lbs): 314.5 Blood Pressure (mmHg):  146/77 Source: Measured Reference Range: 80 - 120 mg / dl Body Mass Index (BMI): 41.5 Electronic Signature(s) Signed: 08/17/2015 4:22:57 PM By: Stanley Cool, RN, BSN, Kim RN, BSN Entered By: Stanley Cool, RN, BSN, Stanley Marks on 08/17/2015 10:25:37

## 2015-08-19 DIAGNOSIS — L97221 Non-pressure chronic ulcer of left calf limited to breakdown of skin: Secondary | ICD-10-CM | POA: Diagnosis not present

## 2015-08-20 NOTE — Progress Notes (Signed)
Stanley Marks ([id number]) Visit Report for 08/19/2015 Arrival Information Details Patient Name: Stanley Marks, Stanley Marks Date of Service: 08/19/2015 3:15 PM Medical Record Number: [id number] Patient Account Number: [id number] Date of Birth/Sex: Apr 10, 1941 (74 y.o. Male) Treating RN: Baruch Gouty, RN, BSN, Velva Harman Primary Care Physician: Nicky Pugh Other Clinician: Referring Physician: Nicky Pugh Treating Physician/Extender: Frann Rider in Treatment: 0 Visit Information History Since Last Visit Any new allergies or adverse reactions: No Patient Arrived: Ambulatory Had a fall or experienced change in No Arrival Time: 15:15 activities of daily living that may affect Accompanied By: wife risk of falls: Transfer Assistance: None Signs or symptoms of abuse/neglect since last No Patient Identification Verified: Yes visito Secondary Verification Process Yes Hospitalized since last visit: No Completed: Has Dressing in Place as Prescribed: Yes Patient Has Alerts: Yes Has Compression in Place as Prescribed: Yes Patient Alerts: Patient on Blood Pain Present Now: No Thinner Electronic Signature(s) Signed: 08/19/2015 3:51:56 PM By: Regan Lemming BSN, RN Previous Signature: 08/19/2015 3:26:27 PM Version By: Regan Lemming BSN, RN Entered By: Regan Lemming on 08/19/2015 15:51:56 Stanley Marks ([id number]) -------------------------------------------------------------------------------- Encounter Discharge Information Details Patient Name: Stanley Marks Date of Service: 08/19/2015 3:15 PM Medical Record Number: [id number] Patient Account Number: [id number] Date of Birth/Sex: 1941-08-27 (74 y.o. Male) Treating RN: Baruch Gouty, RN, BSN, Velva Harman Primary Care Physician: Nicky Pugh Other Clinician: Referring Physician: Nicky Pugh Treating Physician/Extender: Frann Rider in Treatment: 0 Encounter Discharge Information Items Discharge Pain Level: 0 Discharge Condition: Stable Ambulatory Status:  Ambulatory Discharge Destination: Home Private Transportation: Auto Accompanied By: wife Schedule Follow-up Appointment: No Medication Reconciliation completed and No provided to Patient/Care Ninah Moccio: Clinical Summary of Care: Electronic Signature(s) Signed: 08/19/2015 3:27:23 PM By: Regan Lemming BSN, RN Entered By: Regan Lemming on 08/19/2015 15:27:23 Stanley Marks ([id number]) -------------------------------------------------------------------------------- Patient/Caregiver Education Details Patient Name: Stanley Marks Date of Service: 08/19/2015 3:15 PM Medical Record Number: [id number] Patient Account Number: [id number] Date of Birth/Gender: [birth year] (74 y.o. Male) Treating RN: Baruch Gouty, RN, BSN, Velva Harman Primary Care Physician: Nicky Pugh Other Clinician: Referring Physician: Nicky Pugh Treating Physician/Extender: Frann Rider in Treatment: 0 Education Assessment Education Provided To: Patient Education Topics Provided Smoking and Wound Healing: Methods: Explain/Verbal Responses: State content correctly Welcome To [facility]: Methods: Explain/Verbal Responses: State content correctly Electronic Signature(s) Signed: 08/19/2015 3:27:10 PM By: Regan Lemming BSN, RN Entered By: Regan Lemming on 08/19/2015 15:27:10

## 2015-08-24 ENCOUNTER — Encounter: Payer: Medicare Other | Admitting: Surgery

## 2015-08-24 DIAGNOSIS — L97221 Non-pressure chronic ulcer of left calf limited to breakdown of skin: Secondary | ICD-10-CM | POA: Diagnosis not present

## 2015-08-25 NOTE — Progress Notes (Signed)
OLLIN, HOCHMUTH ([id number]) Visit Report for 08/24/2015 Arrival Information Details Patient Name: Stanley Marks, Stanley Marks Date of Service: 08/24/2015 11:30 AM Medical Record Number: [id number] Patient Account Number: [id number] Date of Birth/Sex: 28-Dec-1940 (74 y.o. Male) Treating RN: Montey Hora Primary Care Physician: Nicky Pugh Other Clinician: Referring Physician: Nicky Pugh Treating Physician/Extender: BURNS III, Charlean Sanfilippo in Treatment: 1 Visit Information History Since Last Visit Added or deleted any medications: No Patient Arrived: Ambulatory Any new allergies or adverse reactions: No Arrival Time: 12:01 Had a fall or experienced change in No Accompanied By: self activities of daily living that may affect Transfer Assistance: None risk of falls: Patient Identification Verified: Yes Signs or symptoms of abuse/neglect since last No Secondary Verification Process Yes visito Completed: Hospitalized since last visit: No Patient Has Alerts: Yes Pain Present Now: No Patient Alerts: Patient on Blood Thinner Electronic Signature(s) Signed: 08/24/2015 5:43:43 PM By: Montey Hora Entered By: Montey Hora on 08/24/2015 12:01:34 Stanley Marks ([id number]) -------------------------------------------------------------------------------- Encounter Discharge Information Details Patient Name: Stanley Marks Date of Service: 08/24/2015 11:30 AM Medical Record Number: [id number] Patient Account Number: [id number] Date of Birth/Sex: [birth year] (74 y.o. Male) Treating RN: Montey Hora Primary Care Physician: Nicky Pugh Other Clinician: Referring Physician: Nicky Pugh Treating Physician/Extender: BURNS III, Charlean Sanfilippo in Treatment: 1 Encounter Discharge Information Items Discharge Pain Level: 0 Discharge Condition: Stable Ambulatory Status: Ambulatory Discharge Destination: Home Private Transportation: Auto Accompanied By: self Schedule Follow-up Appointment:  Yes Medication Reconciliation completed and No provided to Patient/Care Neno [place]: Clinical Summary of Care: Electronic Signature(s) Signed: 08/24/2015 5:43:43 PM By: Montey Hora Entered By: Montey Hora on 08/24/2015 12:17:06 Stanley Marks ([id number]) -------------------------------------------------------------------------------- Lower Extremity Assessment Details Patient Name: Stanley Marks Date of Service: 08/24/2015 11:30 AM Medical Record Number: [id number] Patient Account Number: [id number] Date of Birth/Sex: 10-10-1941 (74 y.o. Male) Treating RN: Montey Hora Primary Care Physician: Nicky Pugh Other Clinician: Referring Physician: Nicky Pugh Treating Physician/Extender: BURNS III, WALTER Weeks in Treatment: 1 Edema Assessment Assessed: [Left: No] [Right: No] E[Left: dema] [Right: :] Calf Left: Right: Point of Measurement: 38 cm From Medial Instep 44.2 cm cm Ankle Left: Right: Point of Measurement: 15 cm From Medial Instep 28 cm cm Vascular Assessment Pulses: Posterior Tibial Dorsalis Pedis Palpable: [Left:Yes] Extremity colors, hair growth, and conditions: Extremity Color: [Left:Hyperpigmented] Hair Growth on Extremity: [Left:No] Temperature of Extremity: [Left:Warm] Capillary Refill: [Left:< 3 seconds] Toe Nail Assessment Left: Right: Thick: Yes Discolored: Yes Deformed: No Improper Length and Hygiene: No Electronic Signature(s) Signed: 08/24/2015 5:43:43 PM By: Montey Hora Entered By: Montey Hora on 08/24/2015 12:07:29 Stanley Marks ([id number]) -------------------------------------------------------------------------------- Multi Wound Chart Details Patient Name: Stanley Marks Date of Service: 08/24/2015 11:30 AM Medical Record Number: [id number] Patient Account Number: [id number] Date of Birth/Sex: Jan 20, 1941 (74 y.o. Male) Treating RN: Montey Hora Primary Care Physician: Nicky Pugh Other Clinician: Referring Physician: Nicky Pugh Treating Physician/Extender: BURNS III, WALTER Weeks in Treatment: 1 Vital Signs Height(in): 73 Pulse(bpm): 40 Weight(lbs): 314.5 Blood Pressure 153/84 (mmHg): Body Mass Index(BMI): 41 Temperature(F): 98.1 Respiratory Rate 18 (breaths/min): Photos: [1:No Photos] [2:No Photos] [N/A:N/A] Wound Location: [1:Left Lower Leg - Anterior] [2:Left, Posterior Lower Leg] [N/A:N/A] Wounding Event: [1:Blister] [2:Blister] [N/A:N/A] Primary Etiology: [1:Venous Leg Ulcer] [2:Venous Leg Ulcer] [N/A:N/A] Comorbid History: [1:Cataracts, Chronic Obstructive Pulmonary Disease (COPD), Peripheral Venous Disease, Osteomyelitis] [2:N/A] [N/A:N/A] Date Acquired: [1:08/06/2015] [2:08/06/2015] [N/A:N/A] Weeks of Treatment: [1:1] [2:1] [N/A:N/A] Wound Status: [1:Open] [2:Open] [N/A:N/A] Measurements L x W x D 1.1x1x0.1 [2:0x0x0] [N/A:N/A] (cm) Area (cm) : [1:0.864] [2:0] [N/A:N/A] Volume (cm) : [1:0.086] [2:0] [N/A:N/A] %  Reduction in Area: [1:90.80%] [2:100.00%] [N/A:N/A] % Reduction in Volume: 90.90% [2:100.00%] [N/A:N/A] Classification: [1:Partial Thickness] [2:Partial Thickness] [N/A:N/A] Exudate Amount: [1:Small] [2:N/A] [N/A:N/A] Exudate Type: [1:Serous] [2:N/A] [N/A:N/A] Exudate Color: [1:amber] [2:N/A] [N/A:N/A] Wound Margin: [1:Indistinct, nonvisible] [2:N/A] [N/A:N/A] Granulation Amount: [1:Large (67-100%)] [2:N/A] [N/A:N/A] Granulation Quality: [1:Red] [2:N/A] [N/A:N/A] Necrotic Amount: [1:None Present (0%)] [2:N/A] [N/A:N/A] Exposed Structures: [1:Fascia: No Fat: No Tendon: No Muscle: No] [2:N/A] [N/A:N/A] Joint: No Bone: No Limited to Skin Breakdown Epithelialization: Small (1-33%) N/A N/A Periwound Skin Texture: Edema: No No Abnormalities Noted N/A Excoriation: No Induration: No Callus: No Crepitus: No Fluctuance: No Friable: No Rash: No Scarring: No Periwound Skin Moist: Yes No Abnormalities Noted N/A Moisture: Maceration: No Dry/Scaly: No Periwound Skin Color:  Atrophie Blanche: No No Abnormalities Noted N/A Cyanosis: No Ecchymosis: No Erythema: No Hemosiderin Staining: No Mottled: No Pallor: No Rubor: No Tenderness on Yes No N/A Palpation: Wound Preparation: Ulcer Cleansing: Other: N/A N/A soap and water Topical Anesthetic Applied: None Treatment Notes Electronic Signature(s) Signed: 08/24/2015 5:43:43 PM By: Montey Hora Entered By: Montey Hora on 08/24/2015 12:15:32 Stanley Marks ([id number]) -------------------------------------------------------------------------------- Multi-Disciplinary Care Plan Details Patient Name: Stanley Marks Date of Service: 08/24/2015 11:30 AM Medical Record Number: [id number] Patient Account Number: [id number] Date of Birth/Sex: 1941/11/18 (74 y.o. Male) Treating RN: Montey Hora Primary Care Physician: Nicky Pugh Other Clinician: Referring Physician: Nicky Pugh Treating Physician/Extender: BURNS III, WALTER Weeks in Treatment: 1 Active Inactive Orientation to the Wound Care Program Nursing Diagnoses: Knowledge deficit related to the wound healing center program Goals: Patient/caregiver will verbalize understanding of the [facility] [place] [facility] Date Initiated: 08/17/2015 Goal Status: Active Interventions: Provide education on orientation to the wound center Notes: Venous Leg Ulcer Nursing Diagnoses: Actual venous Insuffiency (use after diagnosis is confirmed) Goals: Patient will maintain optimal edema control Date Initiated: 08/17/2015 Goal Status: Active Interventions: Assess peripheral edema status every visit. Notes: Wound/Skin Impairment Nursing Diagnoses: Knowledge deficit related to smoking impact on wound healing Goals: Patient will demonstrate a reduced rate of smoking or cessation of smoking Date Initiated: 08/17/2015 CAYDAN, MCTAVISH ([id number]) Goal Status: Active Ulcer/skin breakdown will heal within 14 weeks Date Initiated: 08/17/2015 Goal Status:  Active Interventions: Provide education on smoking Treatment Activities: Skin care regimen initiated : 08/24/2015 Notes: Electronic Signature(s) Signed: 08/24/2015 5:43:43 PM By: Montey Hora Entered By: Montey Hora on 08/24/2015 12:15:20 Stanley Marks ([id number]) -------------------------------------------------------------------------------- Patient/Caregiver Education Details Patient Name: Stanley Marks Date of Service: 08/24/2015 11:30 AM Medical Record Number: [id number] Patient Account Number: [id number] Date of Birth/Gender: 01/17/1941 (74 y.o. Male) Treating RN: Montey Hora Primary Care Physician: Nicky Pugh Other Clinician: Referring Physician: Nicky Pugh Treating Physician/Extender: BURNS III, Charlean Sanfilippo in Treatment: 1 Education Assessment Education Provided To: Patient Education Topics Provided Venous: Handouts: Controlling Swelling with Multilayered Compression Wraps Methods: Printed Responses: State content correctly Electronic Signature(s) Signed: 08/24/2015 5:43:43 PM By: Montey Hora Entered By: Montey Hora on 08/24/2015 12:17:26 Stanley Marks ([id number]) -------------------------------------------------------------------------------- Wound Assessment Details Patient Name: Stanley Marks Date of Service: 08/24/2015 11:30 AM Medical Record Number: [id number] Patient Account Number: [id number] Date of Birth/Sex: 07-06-1941 (74 y.o. Male) Treating RN: Montey Hora Primary Care Physician: Nicky Pugh Other Clinician: Referring Physician: Nicky Pugh Treating Physician/Extender: BURNS III, WALTER Weeks in Treatment: 1 Wound Status Wound Number: 1 Primary Venous Leg Ulcer Etiology: Wound Location: Left Lower Leg - Anterior Wound Open Wounding Event: Blister Status: Date Acquired: 08/06/2015 Comorbid Cataracts, Chronic Obstructive Weeks Of Treatment: 1 History: Pulmonary Disease (COPD), Peripheral Clustered Wound: No Venous  Disease, Osteomyelitis Photos Photo  Uploaded By: Montey Hora on 08/24/2015 12:28:23 Wound Measurements Length: (cm) 1.1 Width: (cm) 1 Depth: (cm) 0.1 Area: (cm) 0.864 Volume: (cm) 0.086 % Reduction in Area: 90.8% % Reduction in Volume: 90.9% Epithelialization: Small (1-33%) Tunneling: No Undermining: No Wound Description Classification: Partial Thickness Wound Margin: Indistinct, nonvisible Exudate Amount: Small Exudate Type: Serous Exudate Color: amber Wound Bed Granulation Amount: Large (67-100%) Exposed Structure Granulation Quality: Red Fascia Exposed: No Necrotic Amount: None Present (0%) Fat Layer Exposed: No Tendon Exposed: No DOC, MANDALA ([id number]) Muscle Exposed: No Joint Exposed: No Bone Exposed: No Limited to Skin Breakdown Periwound Skin Texture Texture Color No Abnormalities Noted: No No Abnormalities Noted: No Callus: No Atrophie Blanche: No Crepitus: No Cyanosis: No Excoriation: No Ecchymosis: No Fluctuance: No Erythema: No Friable: No Hemosiderin Staining: No Induration: No Mottled: No Localized Edema: No Pallor: No Rash: No Rubor: No Scarring: No Temperature / Pain Moisture Tenderness on Palpation: Yes No Abnormalities Noted: No Dry / Scaly: No Maceration: No Moist: Yes Wound Preparation Ulcer Cleansing: Other: soap and water, Topical Anesthetic Applied: None Treatment Notes Wound #1 (Left, Anterior Lower Leg) 1. Cleansed with: Cleanse wound with antibacterial soap and water 2. Anesthetic Topical Lidocaine 4% cream to wound bed prior to debridement 4. Dressing Applied: Prisma Ag 5. Secondary Dressing Applied ABD Pad 7. Secured with 2 Layer Lite Compression System - Left Lower Extremity Electronic Signature(s) Signed: 08/24/2015 5:43:43 PM By: Montey Hora Entered By: Montey Hora on 08/24/2015 12:15:11 Stanley Marks  ([id number]) -------------------------------------------------------------------------------- Wound Assessment Details Patient Name: Stanley Marks Date of Service: 08/24/2015 11:30 AM Medical Record Number: [id number] Patient Account Number: [id number] Date of Birth/Sex: Aug 28, 1941 (74 y.o. Male) Treating RN: Montey Hora Primary Care Physician: Nicky Pugh Other Clinician: Referring Physician: Nicky Pugh Treating Physician/Extender: BURNS III, WALTER Weeks in Treatment: 1 Wound Status Wound Number: 2 Primary Etiology: Venous Leg Ulcer Wound Location: Left, Posterior Lower Leg Wound Status: Open Wounding Event: Blister Date Acquired: 08/06/2015 Weeks Of Treatment: 1 Clustered Wound: No Photos Photo Uploaded By: Montey Hora on 08/24/2015 12:28:24 Wound Measurements Length: (cm) 0 % Reduction Width: (cm) 0 % Reduction Depth: (cm) 0 Area: (cm) 0 Volume: (cm) 0 in Area: 100% in Volume: 100% Wound Description Classification: Partial Thickness Periwound Skin Texture Texture Color No Abnormalities Noted: No No Abnormalities Noted: No Moisture No Abnormalities Noted: No Electronic Signature(s) Signed: 08/24/2015 5:43:43 PM By: Josetta Huddle ([id number]) Entered By: Montey Hora on 08/24/2015 12:14:26 Stanley Marks ([id number]) -------------------------------------------------------------------------------- Vitals Details Patient Name: Stanley Marks Date of Service: 08/24/2015 11:30 AM Medical Record Number: [id number] Patient Account Number: [id number] Date of Birth/Sex: [birth year] (74 y.o. Male) Treating RN: Montey Hora Primary Care Physician: Nicky Pugh Other Clinician: Referring Physician: Nicky Pugh Treating Physician/Extender: BURNS III, WALTER Weeks in Treatment: 1 Vital Signs Time Taken: 12:03 Temperature (F): 98.1 Height (in): 73 Pulse (bpm): 40 Weight (lbs): 314.5 Respiratory Rate (breaths/min): 18 Body Mass Index  (BMI): 41.5 Blood Pressure (mmHg): 153/84 Reference Range: 80 - 120 mg / dl Electronic Signature(s) Signed: 08/24/2015 5:43:43 PM By: Montey Hora Entered By: Montey Hora on 08/24/2015 12:04:09

## 2015-08-25 NOTE — Progress Notes (Signed)
TOMOKI, LUCKEN ([id number]) Visit Report for 08/24/2015 Chief Complaint Document Details Patient Name: Stanley Marks, Stanley Marks Date of Service: 08/24/2015 11:30 AM Medical Record Number: [id number] Patient Account Number: [id number] Date of Birth/Sex: 29-Aug-1941 (74 y.o. Male) Treating RN: Baruch Gouty, RN, BSN, Velva Harman Primary Care Physician: Nicky Pugh Other Clinician: Referring Physician: Nicky Pugh Treating Physician/Extender: BURNS III, Charlean Sanfilippo in Treatment: 1 Information Obtained from: Patient Chief Complaint Left calf ulcerations. Electronic Signature(s) Signed: 08/24/2015 4:56:52 PM By: Loletha Grayer MD Entered By: Loletha Grayer on 08/24/2015 13:04:56 Stanley Marks ([id number]) -------------------------------------------------------------------------------- HPI Details Patient Name: Stanley Marks Date of Service: 08/24/2015 11:30 AM Medical Record Number: [id number] Patient Account Number: [id number] Date of Birth/Sex: 1941/08/21 (74 y.o. Male) Treating RN: Baruch Gouty, RN, BSN, Velva Harman Primary Care Physician: Nicky Pugh Other Clinician: Referring Physician: Nicky Pugh Treating Physician/Extender: BURNS III, WALTER Weeks in Treatment: 1 History of Present Illness HPI Description: Pleasant 74 year old with history of chronic venous insufficiency and hypertension. No History of diabetes or peripheral arterial disease. He says that he stopped taking his "fluid pill" recently so that he wouldn't have to urinate as often. He subsequently developed bilateral lower extremity swelling and left calf ulcerations in late August 2016. Status post bilateral lower extremity endovenous ablation over 5 years ago in [place]. He does not regularly wear compression stockings. Denies any history of DVT. He is ambulating per his baseline. No claudication or rest pain. Tolerating 3 layer compression bandage with Prisma. He returns to clinic for follow-up and is without complaints. No significant  pain. No fever or chills. Minimal drainage. Electronic Signature(s) Signed: 08/24/2015 4:56:52 PM By: Loletha Grayer MD Entered By: Loletha Grayer on 08/24/2015 13:05:52 Stanley Marks ([id number]) -------------------------------------------------------------------------------- Physical Exam Details Patient Name: Stanley Marks Date of Service: 08/24/2015 11:30 AM Medical Record Number: [id number] Patient Account Number: [id number] Date of Birth/Sex: 06-14-1941 (74 y.o. Male) Treating RN: Baruch Gouty, RN, BSN, Velva Harman Primary Care Physician: Nicky Pugh Other Clinician: Referring Physician: Nicky Pugh Treating Physician/Extender: BURNS III, WALTER Weeks in Treatment: 1 Constitutional . Pulse regular. Respirations normal and unlabored. Afebrile. Marland Kitchen Respiratory WNL. No retractions.. Cardiovascular Pedal Pulses WNL. Integumentary (Hair, Skin) .Marland Kitchen Neurological Sensation normal to touch, pin,and vibration. Psychiatric Judgement and insight Intact.. Oriented times 3.. No evidence of depression, anxiety, or agitation.. Notes Left anterior calf ulceration. Full-thickness. Biofilm wiped free with gauze. Healthy underlying, bleeding granulation tissue. No debridement required. Minimal surrounding erythema. 1+ pitting edema. 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 Palpable DP bilaterally. Left ABI not obtained secondary to painful ulcerations. Right ABI 1.37. Left posterior calf ulceration is healed. Electronic Signature(s) Signed: 08/24/2015 4:56:52 PM By: Loletha Grayer MD Entered By: Loletha Grayer on 08/24/2015 13:06:56 Stanley Marks ([id number]) -------------------------------------------------------------------------------- Physician Orders Details Patient Name: Stanley Marks Date of Service: 08/24/2015 11:30 AM Medical Record Number: [id number] Patient Account Number: [id number] Date of Birth/Sex: 24-Dec-1940 (74 y.o. Male) Treating RN: Montey Hora Primary  Care Physician: Nicky Pugh Other Clinician: Referring Physician: Nicky Pugh Treating Physician/Extender: BURNS III, WALTER Weeks in Treatment: 1 Verbal / Phone Orders: No Diagnosis Coding Wound Cleansing Wound #1 Left,Anterior Lower Leg o Clean wound with Normal Saline. Anesthetic Wound #1 Left,Anterior Lower Leg o Topical Lidocaine 4% cream applied to wound bed prior to debridement Primary Wound Dressing Wound #1 Left,Anterior Lower Leg o Prisma Ag Secondary Dressing Wound #1 Left,Anterior Lower Leg o ABD pad Dressing Change Frequency Wound #1 Left,Anterior Lower Leg o Change dressing every week Follow-up Appointments Wound #1 Left,Anterior Lower Leg o Return Appointment in  1 week. Edema Control Wound #1 Left,Anterior Lower Leg o 2 Layer Lite Compression System - Left Lower Extremity Electronic Signature(s) Signed: 08/24/2015 4:56:52 PM By: Loletha Grayer MD Signed: 08/24/2015 5:43:43 PM By: Montey Hora Entered By: Montey Hora on 08/24/2015 12:16:02 Stanley Marks ([id number]) Stanley Marks, Stanley Marks ([id number]) -------------------------------------------------------------------------------- Problem List Details Patient Name: Stanley Marks Date of Service: 08/24/2015 11:30 AM Medical Record Number: [id number] Patient Account Number: [id number] Date of Birth/Sex: Jul 27, 1941 (74 y.o. 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 Male) Treating RN: Baruch Gouty, RN, BSN, Velva Harman Primary Care Physician: Nicky Pugh Other Clinician: Referring Physician: Nicky Pugh Treating Physician/Extender: BURNS III, Charlean Sanfilippo in Treatment: 1 Subjective Chief Complaint Information obtained from Patient Left calf ulcerations. History of Present Illness (HPI) Pleasant 74 year old with history of chronic venous insufficiency and hypertension. No History of diabetes or peripheral arterial disease. He says that he stopped taking his "fluid pill" recently so that he wouldn't have to urinate as often. He subsequently developed bilateral lower extremity swelling and left calf ulcerations in late August 2016. Status post bilateral lower extremity endovenous ablation over 5 years ago in [place]. He does not regularly wear compression stockings. Denies any history of DVT. He is ambulating per his baseline. No claudication or rest pain. Tolerating 3 layer compression bandage with Prisma. He returns to clinic for follow-up and is without complaints. No significant pain. No fever or chills. Minimal drainage. Objective Constitutional Pulse regular. Respirations normal and unlabored. Afebrile. Vitals Time Taken: 12:03 PM, Height: 73 in, Weight: 314.5 lbs, BMI: 41.5, Temperature: 98.1 F, Pulse: 40 bpm, Respiratory Rate: 18 breaths/min, Blood Pressure: 153/84 mmHg. Respiratory WNL. No retractions.. Cardiovascular Pedal Pulses WNL. Neurological Sensation normal to touch, pin,and vibration. Stanley Marks, Stanley Marks ([id number]) Psychiatric Judgement and insight Intact.. Oriented times 3.. No evidence of depression,  anxiety, or agitation.. General Notes: Left anterior calf ulceration. Full-thickness. Biofilm wiped free with gauze. Healthy underlying, bleeding granulation tissue. No debridement required. Minimal surrounding erythema. 1+ pitting edema. 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 Palpable DP bilaterally. Left ABI not obtained secondary to painful ulcerations. Right ABI 1.37. Left posterior calf ulceration is healed. Integumentary (Hair, Skin) Wound #1 status is Open. Original cause of wound was Blister. The wound is located on the Left,Anterior Lower Leg. The wound measures 1.1cm length x 1cm width x 0.1cm depth; 0.864cm^2 area and 0.086cm^3 volume. The wound is limited to skin breakdown. There is no tunneling or undermining noted. There is a small amount of serous drainage noted. The wound margin is indistinct and nonvisible. There is large (67-100%) red granulation within the wound bed. There is no necrotic tissue within the wound bed. The periwound skin appearance exhibited: Moist. The periwound skin appearance did not exhibit: Callus, Crepitus, Excoriation, Fluctuance, Friable, Induration, Localized Edema, Rash, Scarring, Dry/Scaly, Maceration, Atrophie Blanche, Cyanosis, Ecchymosis, Hemosiderin Staining, Mottled, Pallor, Rubor, Erythema. The periwound has tenderness on palpation.  Wound #2 status is Open. Original cause of wound was Blister. The wound is located on the Left,Posterior Lower Leg. The wound measures 0cm length x 0cm width x 0cm depth; 0cm^2 area and 0cm^3 volume. 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 Plan Wound Cleansing: Wound #1 Left,Anterior Lower Leg: Clean wound with Normal Saline. Stanley Marks, Stanley Marks ([id number]) Anesthetic: Wound #1 Left,Anterior Lower Leg: Topical Lidocaine 4% cream  applied to wound bed prior to debridement Primary Wound Dressing: Wound #1 Left,Anterior Lower Leg: Prisma Ag Secondary Dressing: Wound #1 Left,Anterior Lower Leg: ABD pad Dressing Change Frequency: Wound #1 Left,Anterior Lower Leg: Change dressing every week Follow-up Appointments: Wound #1 Left,Anterior Lower Leg: Return Appointment in 1 week. Edema Control: Wound #1 Left,Anterior Lower Leg: 2 Layer Lite Compression System - Left Lower Extremity Prisma. 3 layer elastic compression bandage. Electronic Signature(s) Signed: 08/24/2015 4:56:52 PM By: Loletha Grayer MD Entered By: Loletha Grayer on 08/24/2015 13:07:19 Stanley Marks ([id number]) -------------------------------------------------------------------------------- SuperBill Details Patient Name: Stanley Marks Date of Service: 08/24/2015 Medical Record Number: [id number] Patient Account Number: [id number] Date of Birth/Sex: 1941-04-16 (74 y.o. 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

## 2015-08-31 ENCOUNTER — Encounter: Payer: Medicare Other | Admitting: Surgery

## 2015-08-31 DIAGNOSIS — L97221 Non-pressure chronic ulcer of left calf limited to breakdown of skin: Secondary | ICD-10-CM | POA: Diagnosis not present

## 2015-09-01 NOTE — Progress Notes (Signed)
NEPHI, SAVAGE ([id number]) Visit Report for 08/31/2015 Arrival Information Details Patient Name: Stanley, Marks Date of Service: 08/31/2015 10:15 AM Medical Record Number: [id number] Patient Account Number: [id number] Date of Birth/Sex: 11-Jan-1941 (74 y.o. Male) Treating RN: Cornell Barman Primary Care Physician: Nicky Pugh Other Clinician: Referring Physician: Nicky Pugh Treating Physician/Extender: BURNS III, Charlean Sanfilippo in Treatment: 2 Visit Information History Since Last Visit Added or deleted any medications: No Patient Arrived: Ambulatory Any new allergies or adverse reactions: No Arrival Time: 10:14 Had a fall or experienced change in No Accompanied By: wife activities of daily living that may affect Transfer Assistance: None risk of falls: Patient Identification Verified: Yes Signs or symptoms of abuse/neglect since last No Secondary Verification Process Yes visito Completed: Hospitalized since last visit: No Patient Has Alerts: Yes Has Dressing in Place as Prescribed: Yes Patient Alerts: Patient on Blood Pain Present Now: No Thinner Electronic Signature(s) Signed: 08/31/2015 5:10:46 PM By: Gretta Cool, RN, BSN, Kim RN, BSN Entered By: Gretta Cool, RN, BSN, Kim on 08/31/2015 10:14:27 Stanley Marks ([id number]) -------------------------------------------------------------------------------- Clinic Level of Care Assessment Details Patient Name: Stanley Marks Date of Service: 08/31/2015 10:15 AM Medical Record Number: [id number] Patient Account Number: [id number] Date of Birth/Sex: 02-24-1941 (74 y.o. Male) Treating RN: Baruch Gouty, RN, BSN, Administrator, sports Primary Care Physician: Nicky Pugh Other Clinician: Referring Physician: Nicky Pugh Treating Physician/Extender: BURNS III, WALTER Weeks in Treatment: 2 Clinic Level of Care Assessment Items TOOL 4 Quantity Score []  - Use when only an EandM is performed on FOLLOW-UP visit 0 ASSESSMENTS - Nursing Assessment / Reassessment X -  Reassessment of Co-morbidities (includes updates in patient status) 1 10 X - Reassessment of Adherence to Treatment Plan 1 5 ASSESSMENTS - Wound and Skin Assessment / Reassessment X - Simple Wound Assessment / Reassessment - one wound 1 5 []  - Complex Wound Assessment / Reassessment - multiple wounds 0 []  - Dermatologic / Skin Assessment (not related to wound area) 0 ASSESSMENTS - Focused Assessment []  - Circumferential Edema Measurements - multi extremities 0 []  - Nutritional Assessment / Counseling / Intervention 0 X - Lower Extremity Assessment (monofilament, tuning fork, pulses) 1 5 []  - Peripheral Arterial Disease Assessment (using hand held doppler) 0 ASSESSMENTS - Ostomy and/or Continence Assessment and Care []  - Incontinence Assessment and Management 0 []  - Ostomy Care Assessment and Management (repouching, etc.) 0 PROCESS - Coordination of Care X - Simple Patient / Family Education for ongoing care 1 15 []  - Complex (extensive) Patient / Family Education for ongoing care 0 []  - Staff obtains Programmer, systems, Records, Test Results / Process Orders 0 []  - Staff telephones HHA, Nursing Homes / Clarify orders / etc 0 []  - Routine Transfer to another Facility (non-emergent condition) 0 Stanley Marks, Stanley Marks ([id number]) []  - Routine Hospital Admission (non-emergent condition) 0 []  - New Admissions / Biomedical engineer / Ordering NPWT, Apligraf, etc. 0 []  - Emergency Hospital Admission (emergent condition) 0 []  - Simple Discharge Coordination 0 []  - Complex (extensive) Discharge Coordination 0 PROCESS - Special Needs []  - Pediatric / Minor Patient Management 0 []  - Isolation Patient Management 0 []  - Hearing / Language / Visual special needs 0 []  - Assessment of Community assistance (transportation, D/C planning, etc.) 0 []  - Additional assistance / Altered mentation 0 []  - Support Surface(s) Assessment (bed, cushion, seat, etc.) 0 INTERVENTIONS - Wound Cleansing / Measurement X - Simple  Wound Cleansing - one wound 1 5 []  - Complex Wound Cleansing - multiple wounds 0 X - Wound Imaging (photographs - any number of  wounds) 1 5 []  - Wound Tracing (instead of photographs) 0 X - Simple Wound Measurement - one wound 1 5 []  - Complex Wound Measurement - multiple wounds 0 INTERVENTIONS - Wound Dressings X - Small Wound Dressing one or multiple wounds 1 10 []  - Medium Wound Dressing one or multiple wounds 0 []  - Large Wound Dressing one or multiple wounds 0 []  - Application of Medications - topical 0 []  - Application of Medications - injection 0 INTERVENTIONS - Miscellaneous []  - External ear exam 0 Stanley Marks, Stanley Marks ([id number]) []  - Specimen Collection (cultures, biopsies, blood, body fluids, etc.) 0 []  - Specimen(s) / Culture(s) sent or taken to Lab for analysis 0 []  - Patient Transfer (multiple staff / Harrel Lemon Lift / Similar devices) 0 []  - Simple Staple / Suture removal (25 or less) 0 []  - Complex Staple / Suture removal (26 or more) 0 []  - Hypo / Hyperglycemic Management (close monitor of Blood Glucose) 0 []  - Ankle / Brachial Index (ABI) - do not check if billed separately 0 X - Vital Signs 1 5 Has the patient been seen at the hospital within the last three years: Yes Total Score: 70 Level Of Care: New/Established - Level 2 Electronic Signature(s) Signed: 08/31/2015 10:42:09 AM By: Regan Lemming BSN, RN Entered By: Regan Lemming on 08/31/2015 10:42:09 Stanley Marks ([id number]) -------------------------------------------------------------------------------- Encounter Discharge Information Details Patient Name: Stanley Marks Date of Service: 08/31/2015 10:15 AM Medical Record Number: [id number] Patient Account Number: [id number] Date of Birth/Sex: 1941/01/13 (74 y.o. Male) Treating RN: Cornell Barman Primary Care Physician: Nicky Pugh Other Clinician: Referring Physician: Nicky Pugh Treating Physician/Extender: BURNS III, Charlean Sanfilippo in Treatment: 2 Encounter Discharge  Information Items Discharge Pain Level: 0 Discharge Condition: Stable Ambulatory Status: Ambulatory Discharge Destination: Home Transportation: Private Auto Accompanied By: wife Schedule Follow-up Appointment: Yes Medication Reconciliation completed and provided to Patient/Care Yes Provider: Provided on Clinical Summary of Care: 08/31/2015 Form Type Recipient Paper Patient JB Electronic Signature(s) Signed: 08/31/2015 10:43:02 AM By: Ruthine Dose Entered By: Ruthine Dose on 08/31/2015 10:43:02 Stanley Marks ([id number]) -------------------------------------------------------------------------------- General Visit Notes Details Patient Name: Stanley Marks Date of Service: 08/31/2015 10:15 AM Medical Record Number: [id number] Patient Account Number: [id number] Date of Birth/Sex: [birth year] (74 y.o. Male) Treating RN: Cornell Barman Primary Care Physician: Nicky Pugh Other Clinician: Referring Physician: Nicky Pugh Treating Physician/Extender: BURNS III, WALTER Weeks in Treatment: 2 Notes Patient cam in today with two cuts in his wraps at the ankle. He states it was too tight. Patient instructed in the future to unwrap the outer layer of wrap instead of cutting holes in the wrap. Patient states he understands and agrees. Electronic Signature(s) Signed: 08/31/2015 5:10:46 PM By: Gretta Cool, RN, BSN, Kim RN, BSN Entered By: Gretta Cool, RN, BSN, Kim on 08/31/2015 10:52:55 Stanley Marks ([id number]) -------------------------------------------------------------------------------- Lower Extremity Assessment Details Patient Name: Stanley Marks Date of Service: 08/31/2015 10:15 AM Medical Record Number: [id number] Patient Account Number: [id number] Date of Birth/Sex: May 14, 1941 (74 y.o. Male) Treating RN: Cornell Barman Primary Care Physician: Nicky Pugh Other Clinician: Referring Physician: Nicky Pugh Treating Physician/Extender: BURNS III, WALTER Weeks in Treatment: 2 Edema  Assessment Assessed: [Left: No] [Right: No] E[Left: dema] [Right: :] Calf Left: Right: Point of Measurement: 38 cm From Medial Instep 44 cm cm Ankle Left: Right: Point of Measurement: 15 cm From Medial Instep 27.5 cm cm Vascular Assessment Pulses: Posterior Tibial Dorsalis Pedis Palpable: [Left:Yes] Extremity colors, hair growth, and conditions: Extremity Color: [Left:Hyperpigmented] Hair Growth on Extremity: [Left:No] Temperature of Extremity: [Left:Warm] Capillary Refill: [  Left:< 3 seconds] Toe Nail Assessment Left: Right: Thick: Yes Discolored: Yes Deformed: No Improper Length and Hygiene: No Electronic Signature(s) Signed: 08/31/2015 5:10:46 PM By: Gretta Cool, RN, BSN, Kim RN, BSN Entered By: Gretta Cool, RN, BSN, Kim on 08/31/2015 10:21:59 Stanley Marks ([id number]) -------------------------------------------------------------------------------- Multi Wound Chart Details Patient Name: Stanley Marks Date of Service: 08/31/2015 10:15 AM Medical Record Number: [id number] Patient Account Number: [id number] Date of Birth/Sex: December 04, 1941 (74 y.o. Male) Treating RN: Baruch Gouty, RN, BSN, Velva Harman Primary Care Physician: Nicky Pugh Other Clinician: Referring Physician: Nicky Pugh Treating Physician/Extender: BURNS III, WALTER Weeks in Treatment: 2 Vital Signs Height(in): 73 Pulse(bpm): 63 Weight(lbs): 314.5 Blood Pressure 137/78 (mmHg): Body Mass Index(BMI): 41 Temperature(F): 97.5 Respiratory Rate 18 (breaths/min): Photos: [1:No Photos] [N/A:N/A] Wound Location: [1:Left Lower Leg - Anterior] [N/A:N/A] Wounding Event: [1:Blister] [N/A:N/A] Primary Etiology: [1:Venous Leg Ulcer] [N/A:N/A] Comorbid History: [1:Cataracts, Chronic Obstructive Pulmonary Disease (COPD), Peripheral Venous Disease, Osteomyelitis] [N/A:N/A] Date Acquired: [1:08/06/2015] [N/A:N/A] Weeks of Treatment: [1:2] [N/A:N/A] Wound Status: [1:Open] [N/A:N/A] Measurements L x W x D 1x0.6x0.1 [N/A:N/A] (cm) Area  (cm) : [1:0.471] [N/A:N/A] Volume (cm) : [1:0.047] [N/A:N/A] % Reduction in Area: [1:95.00%] [N/A:N/A] % Reduction in Volume: 95.00% [N/A:N/A] Classification: [1:Partial Thickness] [N/A:N/A] Exudate Amount: [1:Small] [N/A:N/A] Exudate Type: [1:Serous] [N/A:N/A] Exudate Color: [1:amber] [N/A:N/A] Wound Margin: [1:Indistinct, nonvisible] [N/A:N/A] Granulation Amount: [1:None Present (0%)] [N/A:N/A] Necrotic Amount: [1:Large (67-100%)] [N/A:N/A] Necrotic Tissue: [1:Eschar] [N/A:N/A] Exposed Structures: [1:Fascia: No Fat: No Tendon: No Muscle: No] [N/A:N/A] Joint: No Bone: No Limited to Skin Breakdown Epithelialization: Small (1-33%) N/A N/A Periwound Skin Texture: Edema: No N/A N/A Excoriation: No Induration: No Callus: No Crepitus: No Fluctuance: No Friable: No Rash: No Scarring: No Periwound Skin Dry/Scaly: Yes N/A N/A Moisture: Maceration: No Moist: No Periwound Skin Color: Atrophie Blanche: No N/A N/A Cyanosis: No Ecchymosis: No Erythema: No Hemosiderin Staining: No Mottled: No Pallor: No Rubor: No Tenderness on Yes N/A N/A Palpation: Wound Preparation: Ulcer Cleansing: Other: N/A N/A soap and water Topical Anesthetic Applied: Other: lidocaine 4% Treatment Notes Electronic Signature(s) Signed: 08/31/2015 10:28:06 AM By: Regan Lemming BSN, RN Entered By: Regan Lemming on 08/31/2015 10:28:06 Stanley Marks ([id number]) -------------------------------------------------------------------------------- Long Details Patient Name: Stanley Marks Date of Service: 08/31/2015 10:15 AM Medical Record Number: [id number] Patient Account Number: [id number] Date of Birth/Sex: [birth year] (74 y.o. Male) Treating RN: Baruch Gouty, RN, BSN, Velva Harman Primary Care Physician: Nicky Pugh Other Clinician: Referring Physician: Nicky Pugh Treating Physician/Extender: BURNS III, Charlean Sanfilippo in Treatment: 2 Active Inactive Orientation to the Wound Care  Program Nursing Diagnoses: Knowledge deficit related to the wound healing center program Goals: Patient/caregiver will verbalize understanding of the [place] Program Date Initiated: 08/17/2015 Goal Status: Active Interventions: Provide education on orientation to the wound center Notes: Venous Leg Ulcer Nursing Diagnoses: Actual venous Insuffiency (use after diagnosis is confirmed) Goals: Patient will maintain optimal edema control Date Initiated: 08/17/2015 Goal Status: Active Interventions: Assess peripheral edema status every visit. Notes: Wound/Skin Impairment Nursing Diagnoses: Knowledge deficit related to smoking impact on wound healing Goals: Patient will demonstrate a reduced rate of smoking or cessation of smoking Date Initiated: 08/17/2015 Stanley Marks, Stanley Marks ([id number]) Goal Status: Active Ulcer/skin breakdown will heal within 14 weeks Date Initiated: 08/17/2015 Goal Status: Active Interventions: Provide education on smoking Treatment Activities: Skin care regimen initiated : 08/31/2015 Notes: Electronic Signature(s) Signed: 08/31/2015 10:27:58 AM By: Regan Lemming BSN, RN Entered By: Regan Lemming on 08/31/2015 10:27:57 Stanley Marks ([id number]) -------------------------------------------------------------------------------- Pain Assessment Details Patient Name: Stanley Marks Date of Service: 08/31/2015 10:15 AM Medical Record  Number: [id number] Patient Account Number: [id number] Date of Birth/Sex: 02/02/1941 (74 y.o. Male) Treating RN: Cornell Barman Primary Care Physician: Nicky Pugh Other Clinician: Referring Physician: Nicky Pugh Treating Physician/Extender: BURNS III, Charlean Sanfilippo in Treatment: 2 Active Problems Location of Pain Severity and Description of Pain Patient Has Paino Yes Site Locations Pain Management and Medication Current Pain Management: Notes pain in ankle Electronic Signature(s) Signed: 08/31/2015 5:10:46 PM By: Gretta Cool, RN, BSN,  Kim RN, BSN Entered By: Gretta Cool, RN, BSN, Kim on 08/31/2015 10:14:58 Stanley Marks ([id number]) -------------------------------------------------------------------------------- Patient/Caregiver Education Details Patient Name: Stanley Marks Date of Service: 08/31/2015 10:15 AM Medical Record Number: [id number] Patient Account Number: [id number] Date of Birth/Gender: 03-11-1941 (74 y.o. Male) Treating RN: Cornell Barman Primary Care Physician: Nicky Pugh Other Clinician: Referring Physician: Nicky Pugh Treating Physician/Extender: BURNS III, Charlean Sanfilippo in Treatment: 2 Education Assessment Education Provided To: Patient Education Topics Provided Wound/Skin Impairment: Handouts: Caring for Your Ulcer, Other: wound care as prescribed Methods: Demonstration, Explain/Verbal Responses: State content correctly Electronic Signature(s) Signed: 08/31/2015 5:10:46 PM By: Gretta Cool, RN, BSN, Kim RN, BSN Entered By: Gretta Cool, RN, BSN, Kim on 08/31/2015 10:41:46 Stanley Marks ([id number]) -------------------------------------------------------------------------------- Wound Assessment Details Patient Name: Stanley Marks Date of Service: 08/31/2015 10:15 AM Medical Record Number: [id number] Patient Account Number: [id number] Date of Birth/Sex: 1941/03/18 (74 y.o. Male) Treating RN: Cornell Barman Primary Care Physician: Nicky Pugh Other Clinician: Referring Physician: Nicky Pugh Treating Physician/Extender: BURNS III, WALTER Weeks in Treatment: 2 Wound Status Wound Number: 1 Primary Venous Leg Ulcer Etiology: Wound Location: Left Lower Leg - Anterior Wound Open Wounding Event: Blister Status: Date Acquired: 08/06/2015 Comorbid Cataracts, Chronic Obstructive Weeks Of Treatment: 2 History: Pulmonary Disease (COPD), Peripheral Clustered Wound: No Venous Disease, Osteomyelitis Photos Photo Uploaded By: Gretta Cool, RN, BSN, Kim on 08/31/2015 11:26:15 Wound Measurements Length: (cm) 1 Width:  (cm) 0.6 Depth: (cm) 0.1 Area: (cm) 0.471 Volume: (cm) 0.047 % Reduction in Area: 95% % Reduction in Volume: 95% Epithelialization: Small (1-33%) Wound Description Classification: Partial Thickness Wound Margin: Indistinct, nonvisible Exudate Amount: Small Exudate Type: Serous Exudate Color: amber Wound Bed Granulation Amount: None Present (0%) Exposed Structure Necrotic Amount: Large (67-100%) Fascia Exposed: No Necrotic Quality: Eschar Fat Layer Exposed: No Tendon Exposed: No Stanley Marks, Stanley Marks ([id number]) Muscle Exposed: No Joint Exposed: No Bone Exposed: No Limited to Skin Breakdown Periwound Skin Texture Texture Color No Abnormalities Noted: No No Abnormalities Noted: No Callus: No Atrophie Blanche: No Crepitus: No Cyanosis: No Excoriation: No Ecchymosis: No Fluctuance: No Erythema: No Friable: No Hemosiderin Staining: No Induration: No Mottled: No Localized Edema: No Pallor: No Rash: No Rubor: No Scarring: No Temperature / Pain Moisture Tenderness on Palpation: Yes No Abnormalities Noted: No Dry / Scaly: Yes Maceration: No Moist: No Wound Preparation Ulcer Cleansing: Other: soap and water, Topical Anesthetic Applied: Other: lidocaine 4%, Treatment Notes Wound #1 (Left, Anterior Lower Leg) 1. Cleansed with: Clean wound with Normal Saline 2. Anesthetic Topical Lidocaine 4% cream to wound bed prior to debridement 4. Dressing Applied: Other dressing (specify in notes) 7. Secured with Tubigrip Notes Neosporin, adhesive bandage, tubigrip Electronic Signature(s) Signed: 08/31/2015 5:10:46 PM By: Gretta Cool, RN, BSN, Kim RN, BSN Entered By: Gretta Cool, RN, BSN, Kim on 08/31/2015 10:22:46 Stanley Marks ([id number]) -------------------------------------------------------------------------------- Vitals Details Patient Name: Stanley Marks Date of Service: 08/31/2015 10:15 AM Medical Record Number: [id number] Patient Account Number: [id number] Date of  Birth/Sex: Dec 07, 1941 (74 y.o. Male) Treating RN: Cornell Barman Primary Care Physician: Nicky Pugh Other Clinician: Referring Physician: Nicky Pugh Treating Physician/Extender: BURNS III, Charlean Sanfilippo  in Treatment: 2 Vital Signs Time Taken: 10:14 Temperature (F): 97.5 Height (in): 73 Pulse (bpm): 63 Weight (lbs): 314.5 Respiratory Rate (breaths/min): 18 Body Mass Index (BMI): 41.5 Blood Pressure (mmHg): 137/78 Reference Range: 80 - 120 mg / dl Electronic Signature(s) Signed: 08/31/2015 5:10:46 PM By: Gretta Cool, RN, BSN, Kim RN, BSN Entered By: Gretta Cool, RN, BSN, Kim on 08/31/2015 10:15:15

## 2015-09-01 NOTE — Progress Notes (Signed)
Stanley Marks, Stanley Marks ([id number]) Visit Report for 08/31/2015 Chief Complaint Document Details Patient Name: MACALLAN, ORD Date of Service: 08/31/2015 10:15 AM Medical Record Number: [id number] Patient Account Number: [id number] Date of Birth/Sex: [birth year] (74 y.o. Male) Treating RN: Cornell Barman Primary Care Physician: Nicky Pugh Other Clinician: Referring Physician: Nicky Pugh Treating Physician/Extender: BURNS III, Charlean Sanfilippo in Treatment: 2 Information Obtained from: Patient Chief Complaint Left calf ulcerations. Electronic Signature(s) Signed: 08/31/2015 2:28:31 PM By: Loletha Grayer MD Entered By: Loletha Grayer on 08/31/2015 11:27:26 Stanley Marks ([id number]) -------------------------------------------------------------------------------- HPI Details Patient Name: Stanley Marks Date of Service: 08/31/2015 10:15 AM Medical Record Number: [id number] Patient Account Number: [id number] Date of Birth/Sex: 06-24-1941 (74 y.o. Male) Treating RN: Cornell Barman Primary Care Physician: Nicky Pugh Other Clinician: Referring Physician: Nicky Pugh Treating Physician/Extender: BURNS III, Semaj Coburn Weeks in Treatment: 2 History of Present Illness HPI Description: Pleasant 74 year old with history of chronic venous insufficiency and hypertension. No History of diabetes or peripheral arterial disease. He says that he stopped taking his "fluid pill" recently so that he wouldn't have to urinate as often. He subsequently developed bilateral lower extremity swelling and left calf ulcerations in late August 2016. Status post bilateral lower extremity endovenous ablation over 5 years ago in [place]. He does not regularly wear compression stockings. Denies any history of DVT. He is ambulating per his baseline. No claudication or rest pain. Tolerating 3 layer compression bandage with Prisma. He returns to clinic for follow-up and is without complaints. No significant pain. No fever or  chills. Minimal drainage. Does not want to continue wearing compression bandages. Electronic Signature(s) Signed: 08/31/2015 2:28:31 PM By: Loletha Grayer MD Entered By: Loletha Grayer on 08/31/2015 11:28:19 Stanley Marks ([id number]) -------------------------------------------------------------------------------- Physical Exam Details Patient Name: Stanley Marks Date of Service: 08/31/2015 10:15 AM Medical Record Number: [id number] Patient Account Number: [id number] Date of Birth/Sex: 1941/05/15 (74 y.o. Male) Treating RN: Cornell Barman Primary Care Physician: Nicky Pugh Other Clinician: Referring Physician: Nicky Pugh Treating Physician/Extender: BURNS III, Reginold Beale Weeks in Treatment: 2 Constitutional . Pulse regular. Respirations normal and unlabored. Afebrile. . Notes Left anterior calf ulceration improved. Almost healed. Superficial dry adherent scab. No drainage. No debridement performed. No cellulitis. 1+ pitting edema. 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 Palpable DP bilaterally. Left ABI not obtained secondary to painful ulcerations. Right ABI 1.37. Left posterior calf ulceration is healed. Electronic Signature(s) Signed: 08/31/2015 2:28:31 PM By: Loletha Grayer MD Entered By: Loletha Grayer on 08/31/2015 11:29:41 Stanley Marks ([id number]) -------------------------------------------------------------------------------- Physician Orders Details Patient Name: Stanley Marks Date of Service: 08/31/2015 10:15 AM Medical Record Number: [id number] Patient Account Number: [id number] Date of Birth/Sex: 03/27/1941 (74 y.o. Male) Treating RN: Baruch Gouty, RN, BSN, Velva Harman Primary Care Physician: Nicky Pugh Other Clinician: Referring Physician: Nicky Pugh Treating Physician/Extender: BURNS III, Charlean Sanfilippo in Treatment: 2 Verbal / Phone Orders: Yes Clinician: Afful, RN, BSN, Rita Read Back and Verified: Yes Diagnosis Coding Wound Cleansing Wound  #1 Left,Anterior Lower Leg o Clean wound with Normal Saline. Anesthetic Wound #1 Left,Anterior Lower Leg o Topical Lidocaine 4% cream applied to wound bed prior to debridement Primary Wound Dressing Wound #1 Left,Anterior Lower Leg o Other: - Neosporin Secondary Dressing Wound #1 Left,Anterior Lower Leg o Gauze and Kerlix/Conform Dressing Change Frequency Wound #1 Left,Anterior Lower Leg o Change dressing every week Follow-up Appointments Wound #1 Left,Anterior Lower Leg o Return Appointment in 1 week. Edema Control Wound #1 Left,Anterior Lower Leg o Tubigrip o Other: - order Juzo Electronic Signature(s) Signed: 08/31/2015 2:28:31  PM By: Loletha Grayer MD Signed: 08/31/2015 4:45:20 PM By: Regan Lemming BSN, RN Entered By: Regan Lemming on 08/31/2015 10:33:43 Stanley Marks, Stanley Marks ([id number]) Stanley Marks, Stanley Marks ([id number]) -------------------------------------------------------------------------------- Problem List Details Patient Name: Stanley Marks Date of Service: 08/31/2015 10:15 AM Medical Record Number: [id number] Patient Account Number: [id number] Date of Birth/Sex: [birth year] (74 y.o. 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 Male) Treating RN: Cornell Barman Primary Care Physician: Nicky Pugh Other Clinician: Referring Physician: Nicky Pugh Treating Physician/Extender: BURNS III, Charlean Sanfilippo in Treatment: 2 Subjective Chief Complaint Information obtained from Patient Left calf ulcerations. History of Present Illness (HPI) Pleasant 73 year old with history of chronic venous insufficiency and hypertension. No History of diabetes or peripheral arterial disease. He says that he stopped taking his "fluid pill" recently so that he wouldn't have to urinate as often. He subsequently developed bilateral lower extremity swelling and left calf ulcerations in late August 2016. Status post bilateral lower extremity endovenous ablation over 5 years ago in [place]. He does not regularly wear compression stockings. Denies any history of DVT. He is ambulating per his baseline. No claudication or rest pain. Tolerating 3 layer compression bandage with Prisma. He returns to clinic for follow-up and is without complaints. No significant pain. No fever or chills. Minimal drainage. Does not want to continue wearing compression bandages. Objective Constitutional Pulse regular. Respirations normal and unlabored. Afebrile. Vitals Time Taken: 10:14 AM, Height: 73 in, Weight: 314.5 lbs, BMI: 41.5, Temperature: 97.5 F, Pulse: 63 bpm, Respiratory Rate: 18 breaths/min, Blood Pressure: 137/78 mmHg. General Notes: Left anterior calf ulceration improved. Almost healed. Superficial dry adherent scab. No drainage. No debridement performed. No cellulitis. 1+ pitting edema. 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 Palpable DP bilaterally. Left ABI not obtained secondary to painful ulcerations. Right ABI 1.37. Left posterior calf ulceration is healed. Integumentary (Hair, Skin) Wound #1 status is Open.  Original cause of wound was Blister. The wound is located on the Stanley Marks, Stanley Marks ([id number]) Lower Leg. The wound measures 1cm length x 0.6cm width x 0.1cm depth; 0.471cm^2 area and 0.047cm^3 volume. The wound is limited to skin breakdown. There is a small amount of serous drainage noted. The wound margin is indistinct and nonvisible. There is no granulation within the wound bed. There is a large (67-100%) amount of necrotic tissue within the wound bed including Eschar. The periwound skin appearance exhibited: Dry/Scaly. The periwound skin appearance did not exhibit: Callus, Crepitus, Excoriation, Fluctuance, Friable, Induration, Localized Edema, Rash, Scarring, Maceration, Moist, Atrophie Blanche, Cyanosis, Ecchymosis, Hemosiderin Staining, Mottled, Pallor, Rubor, Erythema. The periwound has tenderness on palpation. 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 Plan Wound Cleansing: Wound #1 Left,Anterior Lower Leg: Clean wound with Normal Saline. Anesthetic: Wound #1 Left,Anterior Lower Leg: Topical Lidocaine 4% cream applied  to wound bed prior to debridement Primary Wound Dressing: Wound #1 Left,Anterior Lower Leg: Other: - Neosporin Secondary Dressing: Wound #1 Left,Anterior Lower Leg: Gauze and Kerlix/Conform Dressing Change Frequency: Wound #1 Left,Anterior Lower Leg: Change dressing every week Follow-up Appointments: Wound #1 Left,Anterior Lower Leg: Return Appointment in 1 week. Edema Control: Wound #1 Left,Anterior Lower Leg: Stanley Marks, Stanley Marks ([id number]) Tubigrip Other: - order Juzo Neosporin. Patient wishes to discontinue compression bandages. Says that he cannot tolerate compression stockings. He was given a Tubigrip for edema control. We will order him a juxtalite compression  garment. Electronic Signature(s) Signed: 08/31/2015 2:28:31 PM By: Loletha Grayer MD Entered By: Loletha Grayer on 08/31/2015 11:30:30 Stanley Marks ([id number]) -------------------------------------------------------------------------------- SuperBill Details Patient Name: Stanley Marks Date of Service: 08/31/2015 Medical Record Number: [id number] Patient Account Number: [id number] Date of Birth/Sex: 28-Sep-1941 (74 y.o. 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

## 2015-09-07 ENCOUNTER — Encounter: Payer: Medicare Other | Admitting: Surgery

## 2015-09-07 DIAGNOSIS — L97221 Non-pressure chronic ulcer of left calf limited to breakdown of skin: Secondary | ICD-10-CM | POA: Diagnosis not present

## 2015-09-08 NOTE — Progress Notes (Signed)
Stanley Marks, Stanley Marks ([id number]) Visit Report for 09/07/2015 Chief Complaint Document Details Patient Name: Stanley Marks, Stanley Marks Date of Service: 09/07/2015 10:00 AM Medical Record Number: [id number] Patient Account Number: [id number] Date of Birth/Sex: 1941-03-23 (74 y.o. Male) Treating RN: Montey Hora Primary Care Physician: Nicky Pugh Other Clinician: Referring Physician: Nicky Pugh Treating Physician/Extender: BURNS III, Charlean Sanfilippo in Treatment: 3 Information Obtained from: Patient Chief Complaint Left calf ulcerations. Electronic Signature(s) Signed: 09/07/2015 3:24:31 PM By: Loletha Grayer MD Entered By: Loletha Grayer on 09/07/2015 13:32:12 Stanley Marks ([id number]) -------------------------------------------------------------------------------- HPI Details Patient Name: Stanley Marks Date of Service: 09/07/2015 10:00 AM Medical Record Number: [id number] Patient Account Number: [id number] Date of Birth/Sex: June 09, 1941 (74 y.o. Male) Treating RN: Montey Hora Primary Care Physician: Nicky Pugh Other Clinician: Referring Physician: Nicky Pugh Treating Physician/Extender: BURNS III, WALTER Weeks in Treatment: 3 History of Present Illness HPI Description: Pleasant 74 year old with history of chronic venous insufficiency and hypertension. No History of diabetes or peripheral arterial disease. He says that he stopped taking his "fluid pill" so that he wouldn't have to urinate as often. He subsequently developed bilateral lower extremity swelling and left calf ulcerations in late August 2016. Status post bilateral lower extremity endovenous ablation over 5 years ago in [place]. He does not regularly wear compression stockings. Denies any history of DVT. He is ambulating per his baseline. No claudication or rest pain. Tolerated 3 layer compression bandage with Prisma with significant improvement. Recently declined compression bandages and has been using a Tubigrip  for edema control. He returns to clinic for follow-up and is without complaints. No significant pain. No fever or chills. No drainage. Electronic Signature(s) Signed: 09/07/2015 3:24:31 PM By: Loletha Grayer MD Entered By: Loletha Grayer on 09/07/2015 13:33:31 Stanley Marks ([id number]) -------------------------------------------------------------------------------- Physical Exam Details Patient Name: Stanley Marks Date of Service: 09/07/2015 10:00 AM Medical Record Number: [id number] Patient Account Number: [id number] Date of Birth/Sex: 1941-06-07 (74 y.o. Male) Treating RN: Montey Hora Primary Care Physician: Nicky Pugh Other Clinician: Referring Physician: Nicky Pugh Treating Physician/Extender: BURNS III, WALTER Weeks in Treatment: 3 Constitutional . Pulse regular. Respirations normal and unlabored. Afebrile. . Notes Left calf ulcerations completely re-epithelialized. No drainage. No evidence for infection. 1+ pitting edema. Multiple varicosities. Palpable DP. Electronic Signature(s) Signed: 09/07/2015 3:24:31 PM By: Loletha Grayer MD Entered By: Loletha Grayer on 09/07/2015 13:34:09 Stanley Marks ([id number]) -------------------------------------------------------------------------------- Physician Orders Details Patient Name: Stanley Marks Date of Service: 09/07/2015 10:00 AM Medical Record Number: [id number] Patient Account Number: [id number] Date of Birth/Sex: 07-14-1941 (74 y.o. Male) Treating RN: Baruch Gouty, RN, BSN, Velva Harman Primary Care Physician: Nicky Pugh Other Clinician: Referring Physician: Nicky Pugh Treating Physician/Extender: BURNS III, Charlean Sanfilippo in Treatment: 3 Verbal / Phone Orders: Yes Clinician: Afful, RN, BSN, Velva Harman Read Back and Verified: Yes Diagnosis Coding Discharge From [facility] o Discharge from [place] Signature(s) Signed: 09/07/2015 3:24:31 PM By: Loletha Grayer  MD Signed: 09/07/2015 4:41:51 PM By: Regan Lemming BSN, RN Entered By: Regan Lemming on 09/07/2015 10:31:41 Stanley Marks ([id number]) -------------------------------------------------------------------------------- Problem List Details Patient Name: Stanley Marks Date of Service: 09/07/2015 10:00 AM Medical Record Number: [id number] Patient Account Number: [id number] Date of Birth/Sex: 01/29/1941 (74 y.o. 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 Male) Treating RN: Montey Hora Primary Care Physician: Nicky Pugh Other Clinician: Referring Physician: Nicky Pugh Treating Physician/Extender: BURNS III, Charlean Sanfilippo in Treatment: 3 Subjective Chief Complaint Information obtained from Patient Left calf ulcerations. History of Present Illness (HPI) Pleasant 74 year old with history of chronic venous insufficiency and hypertension. No History of diabetes or peripheral arterial disease. He says that he stopped taking his "fluid pill" so that he wouldn't have to urinate as often. He subsequently developed bilateral lower extremity  swelling and left calf ulcerations in late August 2016. Status post bilateral lower extremity endovenous ablation over 5 years ago in [place]. He does not regularly wear compression stockings. Denies any history of DVT. He is ambulating per his baseline. No claudication or rest pain. Tolerated 3 layer compression bandage with Prisma with significant improvement. Recently declined compression bandages and has been using a Tubigrip for edema control. He returns to clinic for follow-up and is without complaints. No significant pain. No fever or chills. No drainage. Objective Constitutional Pulse regular. Respirations normal and unlabored. Afebrile. Vitals Time Taken: 10:22 AM, Height: 73 in, Weight: 314.5 lbs, BMI: 41.5, Temperature: 98.1 F, Pulse: 54 bpm, Respiratory Rate: 20 breaths/min, Blood Pressure: 155/80 mmHg. General Notes: Left calf ulcerations completely re-epithelialized. No drainage. No evidence for infection. 1+ pitting edema. Multiple varicosities. Palpable DP. Integumentary (Hair, Skin) Wound #1 status is Open. Original cause of wound was Blister. The wound is located on the [place], Jasyn ([id number]) Lower Leg. The wound measures 0cm length x 0cm width x 0cm depth; 0cm^2 area and 0cm^3 volume. 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 Plan Discharge From [facility]: Discharge from [facility] - Completed Treatment I recommended that he continue to use compression stockings or Tubigrip for edema control on a daily basis. Frequent leg elevation and ambulation also recommended. I encouraged him to call with any questions or concerns. Otherwise, we will plan on seeing him back in the wound clinic on a when necessary basis. Electronic Signature(s) Signed: 09/07/2015 3:24:31 PM  By: Loletha Grayer MD Entered By: Loletha Grayer on 09/07/2015 13:35:42 Stanley Marks ([id number]) -------------------------------------------------------------------------------- SuperBill Details Patient Name: Stanley Marks Date of Service: 09/07/2015 Medical Record Number: [id number] Patient Account Number: [id number] Date of Birth/Sex: 1941/06/17 (74 y.o. 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

## 2015-09-08 NOTE — Progress Notes (Signed)
KYRIAKOS, BABLER ([id number]) Visit Report for 09/07/2015 Arrival Information Details Patient Name: Stanley Marks, Stanley Marks Date of Service: 09/07/2015 10:00 AM Medical Record Number: [id number] Patient Account Number: [id number] Date of Birth/Sex: 05-06-1941 (74 y.o. Male) Treating RN: Montey Hora Primary Care Physician: Nicky Pugh Other Clinician: Referring Physician: Nicky Pugh Treating Physician/Extender: BURNS III, Charlean Sanfilippo in Treatment: 3 Visit Information History Since Last Visit Added or deleted any medications: No Patient Arrived: Ambulatory Any new allergies or adverse reactions: No Arrival Time: 10:21 Had a fall or experienced change in No Accompanied By: self activities of daily living that may affect Transfer Assistance: None risk of falls: Patient Identification Verified: Yes Signs or symptoms of abuse/neglect since last No Secondary Verification Process Yes visito Completed: Hospitalized since last visit: No Patient Has Alerts: Yes Pain Present Now: No Patient Alerts: Patient on Blood Thinner Electronic Signature(s) Signed: 09/07/2015 4:49:25 PM By: Montey Hora Entered By: Montey Hora on 09/07/2015 10:22:49 Stanley Marks ([id number]) -------------------------------------------------------------------------------- Clinic Level of Care Assessment Details Patient Name: Stanley Marks Date of Service: 09/07/2015 10:00 AM Medical Record Number: [id number] Patient Account Number: [id number] Date of Birth/Sex: 06/06/1941 (74 y.o. Male) Treating RN: Baruch Gouty, RN, BSN, [place] Primary Care Physician: Nicky Pugh Other Clinician: Referring Physician: Nicky Pugh Treating Physician/Extender: BURNS III, WALTER Weeks in Treatment: 3 Clinic Level of Care Assessment Items TOOL 4 Quantity Score []  - Use when only an EandM is performed on FOLLOW-UP visit 0 ASSESSMENTS - Nursing Assessment / Reassessment X - Reassessment of Co-morbidities (includes updates in patient  status) 1 10 X - Reassessment of Adherence to Treatment Plan 1 5 ASSESSMENTS - Wound and Skin Assessment / Reassessment []  - Simple Wound Assessment / Reassessment - one wound 0 []  - Complex Wound Assessment / Reassessment - multiple wounds 0 []  - Dermatologic / Skin Assessment (not related to wound area) 0 ASSESSMENTS - Focused Assessment []  - Circumferential Edema Measurements - multi extremities 0 []  - Nutritional Assessment / Counseling / Intervention 0 X - Lower Extremity Assessment (monofilament, tuning fork, pulses) 1 5 []  - Peripheral Arterial Disease Assessment (using hand held doppler) 0 ASSESSMENTS - Ostomy and/or Continence Assessment and Care []  - Incontinence Assessment and Management 0 []  - Ostomy Care Assessment and Management (repouching, etc.) 0 PROCESS - Coordination of Care X - Simple Patient / Family Education for ongoing care 1 15 []  - Complex (extensive) Patient / Family Education for ongoing care 0 []  - Staff obtains Programmer, systems, Records, Test Results / Process Orders 0 []  - Staff telephones HHA, Nursing Homes / Clarify orders / etc 0 []  - Routine Transfer to another Facility (non-emergent condition) 0 Stanley Marks, Stanley Marks ([id number]) []  - Routine Hospital Admission (non-emergent condition) 0 []  - New Admissions / Biomedical engineer / Ordering NPWT, Apligraf, etc. 0 []  - Emergency Hospital Admission (emergent condition) 0 X - Simple Discharge Coordination 1 10 []  - Complex (extensive) Discharge Coordination 0 PROCESS - Special Needs []  - Pediatric / Minor Patient Management 0 []  - Isolation Patient Management 0 []  - Hearing / Language / Visual special needs 0 []  - Assessment of Community assistance (transportation, D/C planning, etc.) 0 []  - Additional assistance / Altered mentation 0 []  - Support Surface(s) Assessment (bed, cushion, seat, etc.) 0 INTERVENTIONS - Wound Cleansing / Measurement []  - Simple Wound Cleansing - one wound 0 []  - Complex Wound  Cleansing - multiple wounds 0 X - Wound Imaging (photographs - any number of wounds) 1 5 []  - Wound Tracing (instead of photographs) 0 []  - Simple  Wound Measurement - one wound 0 []  - Complex Wound Measurement - multiple wounds 0 INTERVENTIONS - Wound Dressings []  - Small Wound Dressing one or multiple wounds 0 []  - Medium Wound Dressing one or multiple wounds 0 []  - Large Wound Dressing one or multiple wounds 0 []  - Application of Medications - topical 0 []  - Application of Medications - injection 0 INTERVENTIONS - Miscellaneous []  - External ear exam 0 Stanley Marks, Stanley Marks ([id number]) []  - Specimen Collection (cultures, biopsies, blood, body fluids, etc.) 0 []  - Specimen(s) / Culture(s) sent or taken to Lab for analysis 0 []  - Patient Transfer (multiple staff / Harrel Lemon Lift / Similar devices) 0 []  - Simple Staple / Suture removal (25 or less) 0 []  - Complex Staple / Suture removal (26 or more) 0 []  - Hypo / Hyperglycemic Management (close monitor of Blood Glucose) 0 []  - Ankle / Brachial Index (ABI) - do not check if billed separately 0 X - Vital Signs 1 5 Has the patient been seen at the hospital within the last three years: Yes Total Score: 55 Level Of Care: New/Established - Level 2 Electronic Signature(s) Signed: 09/07/2015 11:00:39 AM By: Regan Lemming BSN, RN Entered By: Regan Lemming on 09/07/2015 11:00:38 Stanley Marks ([id number]) -------------------------------------------------------------------------------- Encounter Discharge Information Details Patient Name: Stanley Marks Date of Service: 09/07/2015 10:00 AM Medical Record Number: [id number] Patient Account Number: [id number] Date of Birth/Sex: 1941/01/15 (74 y.o. Male) Treating RN: Montey Hora Primary Care Physician: Nicky Pugh Other Clinician: Referring Physician: Nicky Pugh Treating Physician/Extender: BURNS III, Charlean Sanfilippo in Treatment: 3 Encounter Discharge Information Items Discharge Pain Level:  0 Discharge Condition: Stable Ambulatory Status: Ambulatory Discharge Destination: Home Transportation: Private Auto Accompanied By: self Schedule Follow-up Appointment: No Medication Reconciliation completed and provided to Patient/Care No Provider: Provided on Clinical Summary of Care: 09/07/2015 Form Type Recipient Paper Patient JB Electronic Signature(s) Signed: 09/07/2015 10:36:29 AM By: Ruthine Dose Entered By: Ruthine Dose on 09/07/2015 10:36:28 Stanley Marks ([id number]) -------------------------------------------------------------------------------- Lower Extremity Assessment Details Patient Name: Stanley Marks Date of Service: 09/07/2015 10:00 AM Medical Record Number: [id number] Patient Account Number: [id number] Date of Birth/Sex: November 05, 1941 (74 y.o. Male) Treating RN: Montey Hora Primary Care Physician: Nicky Pugh Other Clinician: Referring Physician: Nicky Pugh Treating Physician/Extender: BURNS III, WALTER Weeks in Treatment: 3 Edema Assessment Assessed: [Left: No] [Right: No] Edema: [Left: Ye] [Right: s] Calf Left: Right: Point of Measurement: 38 cm From Medial Instep 46 cm cm Ankle Left: Right: Point of Measurement: 15 cm From Medial Instep 30.2 cm cm Vascular Assessment Pulses: Posterior Tibial Dorsalis Pedis Palpable: [Left:Yes] Extremity colors, hair growth, and conditions: Extremity Color: [Left:Hyperpigmented] Hair Growth on Extremity: [Left:No] Temperature of Extremity: [Left:Warm] Capillary Refill: [Left:< 3 seconds] Toe Nail Assessment Left: Right: Thick: Yes Discolored: Yes Deformed: No Improper Length and Hygiene: No Electronic Signature(s) Signed: 09/07/2015 4:49:25 PM By: Montey Hora Entered By: Montey Hora on 09/07/2015 10:26:47 Stanley Marks ([id number]) -------------------------------------------------------------------------------- Multi-Disciplinary Care Plan Details Patient Name: Stanley Marks Date of  Service: 09/07/2015 10:00 AM Medical Record Number: [id number] Patient Account Number: [id number] Date of Birth/Sex: 04-Jun-1941 (74 y.o. Male) Treating RN: Baruch Gouty, RN, BSN, Velva Harman Primary Care Physician: Nicky Pugh Other Clinician: Referring Physician: Nicky Pugh Treating Physician/Extender: BURNS III, Charlean Sanfilippo in Treatment: 3 Active Inactive Electronic Signature(s) Signed: 09/07/2015 4:41:51 PM By: Regan Lemming BSN, RN Entered By: Regan Lemming on 09/07/2015 10:30:31 Stanley Marks ([id number]) -------------------------------------------------------------------------------- Patient/Caregiver Education Details Patient Name: Stanley Marks Date of Service: 09/07/2015 10:00 AM Medical Record Number: [id number] Patient Account Number: [id number]  Date of Birth/Gender: [birth year] (74 y.o. Male) Treating RN: Montey Hora Primary Care Physician: Nicky Pugh Other Clinician: Referring Physician: Nicky Pugh Treating Physician/Extender: BURNS III, Charlean Sanfilippo in Treatment: 3 Education Assessment Education Provided To: Patient Education Topics Provided Venous: Handouts: Other: continue with compression hose Methods: Explain/Verbal Responses: State content correctly Electronic Signature(s) Signed: 09/07/2015 4:49:25 PM By: Montey Hora Entered By: Montey Hora on 09/07/2015 10:35:29 Stanley Marks ([id number]) -------------------------------------------------------------------------------- Wound Assessment Details Patient Name: Stanley Marks Date of Service: 09/07/2015 10:00 AM Medical Record Number: [id number] Patient Account Number: [id number] Date of Birth/Sex: [birth year] (74 y.o. Male) Treating RN: Montey Hora Primary Care Physician: Nicky Pugh Other Clinician: Referring Physician: Nicky Pugh Treating Physician/Extender: BURNS III, WALTER Weeks in Treatment: 3 Wound Status Wound Number: 1 Primary Etiology: Venous Leg Ulcer Wound Location: Left, Anterior  Lower Leg Wound Status: Open Wounding Event: Blister Date Acquired: 08/06/2015 Weeks Of Treatment: 3 Clustered Wound: No Photos Photo Uploaded By: Montey Hora on 09/07/2015 10:43:15 Wound Measurements Length: (cm) 0 % Reduction i Width: (cm) 0 % Reduction i Depth: (cm) 0 Area: (cm) 0 Volume: (cm) 0 n Area: 100% n Volume: 100% Wound Description Classification: Partial Thickness Periwound Skin Texture Texture Color No Abnormalities Noted: No No Abnormalities Noted: No Moisture No Abnormalities Noted: No Electronic Signature(s) Signed: 09/07/2015 4:49:25 PM By: Josetta Huddle ([id number]) Entered By: Montey Hora on 09/07/2015 10:27:00 Stanley Marks ([id number]) -------------------------------------------------------------------------------- Vitals Details Patient Name: Stanley Marks Date of Service: 09/07/2015 10:00 AM Medical Record Number: [id number] Patient Account Number: [id number] Date of Birth/Sex: July 25, 1941 (74 y.o. Male) Treating RN: Montey Hora Primary Care Physician: Nicky Pugh Other Clinician: Referring Physician: Nicky Pugh Treating Physician/Extender: BURNS III, WALTER Weeks in Treatment: 3 Vital Signs Time Taken: 10:22 Temperature (F): 98.1 Height (in): 73 Pulse (bpm): 54 Weight (lbs): 314.5 Respiratory Rate (breaths/min): 20 Body Mass Index (BMI): 41.5 Blood Pressure (mmHg): 155/80 Reference Range: 80 - 120 mg / dl Electronic Signature(s) Signed: 09/07/2015 4:49:25 PM By: Montey Hora Entered By: Montey Hora on 09/07/2015 10:24:35

## 2016-03-01 DIAGNOSIS — G4733 Obstructive sleep apnea (adult) (pediatric): Secondary | ICD-10-CM | POA: Insufficient documentation

## 2016-04-05 ENCOUNTER — Ambulatory Visit: Admission: RE | Admit: 2016-04-05 | Payer: Medicare Other | Source: Ambulatory Visit | Admitting: Gastroenterology

## 2016-04-05 ENCOUNTER — Encounter: Admission: RE | Payer: Self-pay | Source: Ambulatory Visit

## 2016-04-05 SURGERY — COLONOSCOPY WITH PROPOFOL
Anesthesia: General

## 2016-11-20 ENCOUNTER — Ambulatory Visit: Payer: Medicare Other | Admitting: Anesthesiology

## 2016-11-20 ENCOUNTER — Ambulatory Visit
Admission: RE | Admit: 2016-11-20 | Discharge: 2016-11-20 | Disposition: A | Discharge status: Home / Self Care | Payer: Medicare Other | Source: Ambulatory Visit | Attending: Cardiovascular Disease | Admitting: Cardiovascular Disease

## 2016-11-20 ENCOUNTER — Encounter
Admission: RE | Disposition: A | Discharge status: Home / Self Care | Payer: Self-pay | Source: Ambulatory Visit | Attending: Cardiovascular Disease

## 2016-11-20 ENCOUNTER — Encounter: Payer: Self-pay | Admitting: *Deleted

## 2016-11-20 DIAGNOSIS — I2510 Atherosclerotic heart disease of native coronary artery without angina pectoris: Secondary | ICD-10-CM | POA: Diagnosis not present

## 2016-11-20 DIAGNOSIS — I110 Hypertensive heart disease with heart failure: Secondary | ICD-10-CM | POA: Diagnosis not present

## 2016-11-20 DIAGNOSIS — J449 Chronic obstructive pulmonary disease, unspecified: Secondary | ICD-10-CM | POA: Diagnosis not present

## 2016-11-20 DIAGNOSIS — I359 Nonrheumatic aortic valve disorder, unspecified: Secondary | ICD-10-CM | POA: Insufficient documentation

## 2016-11-20 DIAGNOSIS — R001 Bradycardia, unspecified: Secondary | ICD-10-CM | POA: Insufficient documentation

## 2016-11-20 DIAGNOSIS — I509 Heart failure, unspecified: Secondary | ICD-10-CM | POA: Diagnosis not present

## 2016-11-20 DIAGNOSIS — Z8249 Family history of ischemic heart disease and other diseases of the circulatory system: Secondary | ICD-10-CM | POA: Diagnosis not present

## 2016-11-20 DIAGNOSIS — I739 Peripheral vascular disease, unspecified: Secondary | ICD-10-CM | POA: Diagnosis not present

## 2016-11-20 DIAGNOSIS — Z79899 Other long term (current) drug therapy: Secondary | ICD-10-CM | POA: Insufficient documentation

## 2016-11-20 DIAGNOSIS — F17210 Nicotine dependence, cigarettes, uncomplicated: Secondary | ICD-10-CM | POA: Diagnosis not present

## 2016-11-20 DIAGNOSIS — Z7901 Long term (current) use of anticoagulants: Secondary | ICD-10-CM | POA: Insufficient documentation

## 2016-11-20 DIAGNOSIS — I4891 Unspecified atrial fibrillation: Secondary | ICD-10-CM | POA: Diagnosis not present

## 2016-11-20 DIAGNOSIS — Z7951 Long term (current) use of inhaled steroids: Secondary | ICD-10-CM | POA: Insufficient documentation

## 2016-11-20 DIAGNOSIS — G4733 Obstructive sleep apnea (adult) (pediatric): Secondary | ICD-10-CM | POA: Diagnosis not present

## 2016-11-20 HISTORY — PX: ELECTROPHYSIOLOGIC STUDY: SHX172A

## 2016-11-20 HISTORY — DX: Malignant (primary) neoplasm, unspecified: C80.1

## 2016-11-20 SURGERY — CARDIOVERSION (CATH LAB)
Anesthesia: General

## 2016-11-20 MED ORDER — SODIUM CHLORIDE 0.9 % IV SOLN
INTRAVENOUS | Status: DC
Start: 2016-11-20 — End: 2016-11-20
  Administered 2016-11-20: 07:00:00 via INTRAVENOUS

## 2016-11-20 MED ORDER — LIDOCAINE HCL (CARDIAC) 20 MG/ML IV SOLN
INTRAVENOUS | Status: DC | PRN
  Administered 2016-11-20: 20 mg via INTRAVENOUS

## 2016-11-20 MED ORDER — FENTANYL CITRATE (PF) 100 MCG/2ML IJ SOLN
INTRAMUSCULAR | Status: DC | PRN
  Administered 2016-11-20: 50 ug via INTRAVENOUS

## 2016-11-20 MED ORDER — MIDAZOLAM HCL 2 MG/2ML IJ SOLN
INTRAMUSCULAR | Status: DC | PRN
  Administered 2016-11-20: 1 mg via INTRAVENOUS

## 2016-11-20 MED ORDER — PROPOFOL 10 MG/ML IV BOLUS
INTRAVENOUS | Status: DC | PRN
  Administered 2016-11-20: 40 mg via INTRAVENOUS

## 2016-11-20 NOTE — Anesthesia Procedure Notes (Signed)
Date/Time: 11/20/2016 7:30 AM Performed by: Allean Found Pre-anesthesia Checklist: Patient identified, Emergency Drugs available, Suction available, Patient being monitored and Timeout performed Patient Re-evaluated:Patient Re-evaluated prior to inductionOxygen Delivery Method: Nasal cannula Preoxygenation: Pre-oxygenation with 100% oxygen Intubation Type: IV induction

## 2016-11-20 NOTE — Transfer of Care (Signed)
Immediate Anesthesia Transfer of Care Note  Patient: Stanley Marks  Procedure(s) Performed: Procedure(s): CARDIOVERSION (N/A)  Patient Location: PACU and Cath Lab  Anesthesia Type:General  Level of Consciousness: awake  Airway & Oxygen Therapy: Patient Spontanous Breathing and Patient connected to nasal cannula oxygen  Post-op Assessment: Report given to RN and Post -op Vital signs reviewed and stable  Post vital signs: Reviewed and stable  Last Vitals:  Vitals:   11/20/16 0649  Temp: 36.6 C  11/20/2016  0748 116/76 51 16   Last Pain:  Vitals:   11/20/16 0649  TempSrc: Oral         Complications: No apparent anesthesia complications

## 2016-11-20 NOTE — Discharge Instructions (Signed)
Electrical Cardioversion, Care After °This sheet gives you information about how to care for yourself after your procedure. Your health care provider may also give you more specific instructions. If you have problems or questions, contact your health care provider. °What can I expect after the procedure? °After the procedure, it is common to have: °· Some redness on the skin where the shocks were given. °Follow these instructions at home: °· Do not drive for 24 hours if you were given a medicine to help you relax (sedative). °· Take over-the-counter and prescription medicines only as told by your health care provider. °· Ask your health care provider how to check your pulse. Check it often. °· Rest for 48 hours after the procedure or as told by your health care provider. °· Avoid or limit your caffeine use as told by your health care provider. °Contact a health care provider if: °· You feel like your heart is beating too quickly or your pulse is not regular. °· You have a serious muscle cramp that does not go away. °Get help right away if: °· You have discomfort in your chest. °· You are dizzy or you feel faint. °· You have trouble breathing or you are short of breath. °· Your speech is slurred. °· You have trouble moving an arm or leg on one side of your body. °· Your fingers or toes turn cold or blue. °This information is not intended to replace advice given to you by your health care provider. Make sure you discuss any questions you have with your health care provider. °Document Released: 09/16/2013 Document Revised: 06/29/2016 Document Reviewed: 06/01/2016 °Elsevier Interactive Patient Education © 2017 [employer] ° °

## 2016-11-20 NOTE — Op Note (Signed)
  NAMELucah Marks   MRN: [id number] DOB:  May 13, 1941   ADMIT DATE: 11/20/2016  Procedure: Electrical Cardioversion Indications:  Atrial Fibrillation  Procedure Details:    Time Out: Verified patient identification, verified procedure, site/side was marked, verified correct patient position, special equipment/implants available, medications/allergies/relevent history reviewed, required imaging and test results available.    Patient placed on cardiac monitor, pulse oximetry, supplemental oxygen as necessary.  Sedation given:  Pacer pads placed   Cardioverted . Patient converted with 200 J successfully Cardioverted at 200 J  Evaluation: Findings: Post procedure EKG shows: Sinus bradycardia Complications: None Patient did well.     Stanley Marks, M.D. [facility]   11/20/2016 8:08 AM

## 2016-11-20 NOTE — Anesthesia Preprocedure Evaluation (Signed)
Anesthesia Evaluation  Patient identified by MRN, date of birth, ID band Patient awake    Reviewed: Allergy & Precautions, NPO status , Patient's Chart, lab work & pertinent test results  History of Anesthesia Complications Negative for: history of anesthetic complications  Airway Mallampati: III       Dental  (+) Chipped, Missing, Poor Dentition   Pulmonary COPD, Current Smoker,           Cardiovascular hypertension, Pt. on medications and Pt. on home beta blockers + Peripheral Vascular Disease  + dysrhythmias Atrial Fibrillation      Neuro/Psych negative neurological ROS     GI/Hepatic negative GI ROS, Neg liver ROS,   Endo/Other  negative endocrine ROS  Renal/GU negative Renal ROS     Musculoskeletal   Abdominal   Peds  Hematology   Anesthesia Other Findings   Reproductive/Obstetrics                             Anesthesia Physical Anesthesia Plan  ASA: III  Anesthesia Plan: General   Post-op Pain Management:    Induction: Intravenous  Airway Management Planned: Nasal Cannula  Additional Equipment:   Intra-op Plan:   Post-operative Plan:   Informed Consent: I have reviewed the patients History and Physical, chart, labs and discussed the procedure including the risks, benefits and alternatives for the proposed anesthesia with the patient or authorized representative who has indicated his/her understanding and acceptance.     Plan Discussed with:   Anesthesia Plan Comments:         Anesthesia Quick Evaluation

## 2016-11-20 NOTE — Anesthesia Postprocedure Evaluation (Signed)
Anesthesia Post Note  Patient: Stanley Marks  Procedure(s) Performed: Procedure(s) (LRB): CARDIOVERSION (N/A)  Patient location during evaluation: Other Anesthesia Type: General Level of consciousness: awake and alert Pain management: pain level controlled Vital Signs Assessment: post-procedure vital signs reviewed and stable Respiratory status: spontaneous breathing and respiratory function stable Cardiovascular status: stable Anesthetic complications: no    Last Vitals:  Vitals:   11/20/16 0805 11/20/16 0817  BP:  104/64  Pulse: (!) 55 (!) 49  Resp: 19 (!) 23  Temp:      Last Pain:  Vitals:   11/20/16 0649  TempSrc: Oral                 KEPHART,WILLIAM K

## 2016-12-14 ENCOUNTER — Encounter: Payer: Self-pay | Admitting: Cardiovascular Disease

## 2017-01-24 ENCOUNTER — Encounter: Payer: Self-pay | Admitting: Emergency Medicine

## 2017-01-24 ENCOUNTER — Inpatient Hospital Stay
Admission: EM | Admit: 2017-01-24 | Discharge: 2017-01-28 | DRG: 193 | Disposition: A | Discharge status: Home / Self Care | Payer: Medicare Other | Attending: Internal Medicine | Admitting: Internal Medicine

## 2017-01-24 ENCOUNTER — Emergency Department: Payer: Medicare Other

## 2017-01-24 DIAGNOSIS — Z79899 Other long term (current) drug therapy: Secondary | ICD-10-CM

## 2017-01-24 DIAGNOSIS — J9610 Chronic respiratory failure, unspecified whether with hypoxia or hypercapnia: Secondary | ICD-10-CM | POA: Diagnosis present

## 2017-01-24 DIAGNOSIS — R001 Bradycardia, unspecified: Secondary | ICD-10-CM | POA: Diagnosis not present

## 2017-01-24 DIAGNOSIS — I878 Other specified disorders of veins: Secondary | ICD-10-CM | POA: Diagnosis present

## 2017-01-24 DIAGNOSIS — R0600 Dyspnea, unspecified: Secondary | ICD-10-CM

## 2017-01-24 DIAGNOSIS — I110 Hypertensive heart disease with heart failure: Secondary | ICD-10-CM | POA: Diagnosis present

## 2017-01-24 DIAGNOSIS — J440 Chronic obstructive pulmonary disease with acute lower respiratory infection: Secondary | ICD-10-CM | POA: Diagnosis present

## 2017-01-24 DIAGNOSIS — J439 Emphysema, unspecified: Secondary | ICD-10-CM

## 2017-01-24 DIAGNOSIS — Z8249 Family history of ischemic heart disease and other diseases of the circulatory system: Secondary | ICD-10-CM

## 2017-01-24 DIAGNOSIS — F17210 Nicotine dependence, cigarettes, uncomplicated: Secondary | ICD-10-CM | POA: Diagnosis present

## 2017-01-24 DIAGNOSIS — M6281 Muscle weakness (generalized): Secondary | ICD-10-CM

## 2017-01-24 DIAGNOSIS — J441 Chronic obstructive pulmonary disease with (acute) exacerbation: Secondary | ICD-10-CM | POA: Diagnosis present

## 2017-01-24 DIAGNOSIS — J189 Pneumonia, unspecified organism: Secondary | ICD-10-CM

## 2017-01-24 DIAGNOSIS — J9621 Acute and chronic respiratory failure with hypoxia: Secondary | ICD-10-CM | POA: Diagnosis present

## 2017-01-24 DIAGNOSIS — J4489 Other specified chronic obstructive pulmonary disease: Secondary | ICD-10-CM

## 2017-01-24 DIAGNOSIS — Z716 Tobacco abuse counseling: Secondary | ICD-10-CM

## 2017-01-24 DIAGNOSIS — Z9119 Patient's noncompliance with other medical treatment and regimen: Secondary | ICD-10-CM

## 2017-01-24 DIAGNOSIS — G4733 Obstructive sleep apnea (adult) (pediatric): Secondary | ICD-10-CM | POA: Diagnosis present

## 2017-01-24 DIAGNOSIS — J101 Influenza due to other identified influenza virus with other respiratory manifestations: Secondary | ICD-10-CM

## 2017-01-24 DIAGNOSIS — J1000 Influenza due to other identified influenza virus with unspecified type of pneumonia: Secondary | ICD-10-CM | POA: Diagnosis not present

## 2017-01-24 DIAGNOSIS — D72829 Elevated white blood cell count, unspecified: Secondary | ICD-10-CM

## 2017-01-24 DIAGNOSIS — Z91199 Patient's noncompliance with other medical treatment and regimen due to unspecified reason: Secondary | ICD-10-CM

## 2017-01-24 DIAGNOSIS — I5022 Chronic systolic (congestive) heart failure: Secondary | ICD-10-CM

## 2017-01-24 DIAGNOSIS — Z7901 Long term (current) use of anticoagulants: Secondary | ICD-10-CM

## 2017-01-24 DIAGNOSIS — I4891 Unspecified atrial fibrillation: Secondary | ICD-10-CM | POA: Diagnosis present

## 2017-01-24 DIAGNOSIS — I429 Cardiomyopathy, unspecified: Secondary | ICD-10-CM | POA: Diagnosis present

## 2017-01-24 DIAGNOSIS — Z85828 Personal history of other malignant neoplasm of skin: Secondary | ICD-10-CM

## 2017-01-24 DIAGNOSIS — I739 Peripheral vascular disease, unspecified: Secondary | ICD-10-CM | POA: Diagnosis present

## 2017-01-24 LAB — CBC
HCT: 48.4 % (ref 40.0–52.0)
Hemoglobin: 16 g/dL (ref 13.0–18.0)
MCH: 29.4 pg (ref 26.0–34.0)
MCHC: 33.2 g/dL (ref 32.0–36.0)
MCV: 88.6 fL (ref 80.0–100.0)
PLATELETS: 172 10*3/uL (ref 150–440)
RBC: 5.46 MIL/uL (ref 4.40–5.90)
RDW: 15.6 % — AB (ref 11.5–14.5)
WBC: 16.2 10*3/uL — AB (ref 3.8–10.6)

## 2017-01-24 LAB — COMPREHENSIVE METABOLIC PANEL
ALBUMIN: 3.7 g/dL (ref 3.5–5.0)
ALT: 49 U/L (ref 17–63)
AST: 36 U/L (ref 15–41)
Alkaline Phosphatase: 94 U/L (ref 38–126)
Anion gap: 8 (ref 5–15)
BUN: 16 mg/dL (ref 6–20)
CHLORIDE: 104 mmol/L (ref 101–111)
CO2: 25 mmol/L (ref 22–32)
Calcium: 8.5 mg/dL — ABNORMAL LOW (ref 8.9–10.3)
Creatinine, Ser: 1.07 mg/dL (ref 0.61–1.24)
GFR calc Af Amer: >60 mL/min (ref >60)
GLUCOSE: 144 mg/dL — AB (ref 65–99)
POTASSIUM: 3.9 mmol/L (ref 3.5–5.1)
Sodium: 137 mmol/L (ref 135–145)
Total Bilirubin: 1.7 mg/dL — ABNORMAL HIGH (ref 0.3–1.2)
Total Protein: 7.8 g/dL (ref 6.5–8.1)

## 2017-01-24 LAB — INFLUENZA PANEL BY PCR (TYPE A & B)
INFLBPCR: NEGATIVE
Influenza A By PCR: POSITIVE — AB

## 2017-01-24 LAB — BRAIN NATRIURETIC PEPTIDE: B NATRIURETIC PEPTIDE 5: 130 pg/mL — AB (ref 0.0–100.0)

## 2017-01-24 MED ORDER — IPRATROPIUM-ALBUTEROL 0.5-2.5 (3) MG/3ML IN SOLN
3.0000 mL | Freq: Once | RESPIRATORY_TRACT | Status: AC
  Administered 2017-01-24: 3 mL via RESPIRATORY_TRACT
  Filled 2017-01-24: qty 3

## 2017-01-24 MED ORDER — OSELTAMIVIR PHOSPHATE 75 MG PO CAPS
75.0000 mg | ORAL_CAPSULE | Freq: Once | ORAL | Status: AC
  Administered 2017-01-24: 75 mg via ORAL
  Filled 2017-01-24: qty 1

## 2017-01-24 MED ORDER — METHYLPREDNISOLONE SODIUM SUCC 125 MG IJ SOLR
125.0000 mg | Freq: Once | INTRAMUSCULAR | Status: AC
  Administered 2017-01-24: 125 mg via INTRAVENOUS
  Filled 2017-01-24: qty 2

## 2017-01-24 NOTE — ED Triage Notes (Signed)
Pt presents with shortness of breath started last night. Per ed tech pt o2 on room air was 87%. Pt placed on 2 liters of oxygen and now sats are 90%. Pt denies any pain.

## 2017-01-24 NOTE — ED Notes (Signed)
Pt's o2sat noted to be 88% on 3L, increased to 4L.   

## 2017-01-24 NOTE — H&P (Signed)
History and Physical   [facility] - [place] @ [facility] Admission History and Physical [employer], D.O.    Patient Name: Stanley Marks MR#: [id number] Date of Birth: 18-Oct-1941 Date of Admission: 01/24/2017  Referring MD/NP/PA: Dr. Marcelene Butte Primary Care Physician: Marden Noble, MD Outpatient Specialists: Dr. Mitchel Honour  Patient coming from: Home  Chief Complaint: SOB  HPI: Stanley Marks is a 76 y.o. male with a known history of COPD, hypertension, peripheral vascular disease, atrial fibrillation status post cardioversion 12/17  was in a usual state of health until about 5 days ago when he describes worsening cough productive of clear to yellow sputum associated with shortness of breath, generalized weakness and fatigue..   Otherwise there has been no change in status. Patient has been taking medication as prescribed and there has been no recent change in medication or diet.  No recent antibiotics.  There has been no recent illness, hospitalizations, travel or sick contacts.    Patient denies fevers/chills, weakness, dizziness, chest pain, shortness of breath, N/V/C/D, abdominal pain, dysuria/frequency, changes in mental status.   ED Course: Patient was found to be hypoxic on arrival. He tested positive for influenza A, received DuoNeb, Solu-Medrol and Tamiflu with significant improvement in his symptoms however he remains hypoxic despite 2-3 L of oxygen via nasal. cannula  Review of Systems:  CONSTITUTIONAL: No fever/chills, weakness, weight gain/loss, headache. positive generalized fatigue EYES: No blurry or double vision. ENT: No tinnitus, postnasal drip, redness or soreness of the oropharynx. RESPIRATORY: Positive cough, dyspnea, wheeze.  No hemoptysis.  CARDIOVASCULAR: No chest pain, palpitations, syncope, orthopnea. No lower extremity edema.  GASTROINTESTINAL: No nausea, vomiting, abdominal pain, diarrhea, constipation.  No hematemesis, melena or  hematochezia. GENITOURINARY: No dysuria, frequency, hematuria. ENDOCRINE: No polyuria or nocturia. No heat or cold intolerance. HEMATOLOGY: No anemia, bruising, bleeding. INTEGUMENTARY: No rashes, ulcers, lesions. MUSCULOSKELETAL: No arthritis, gout, dyspnea. NEUROLOGIC: No numbness, tingling, ataxia, seizure-type activity, weakness. PSYCHIATRIC: No anxiety, depression, insomnia.   Past Medical History:  Diagnosis Date  . Cancer ([place])    skin  . Emphysema of lung ([place])   . Hypertension   . PVD (peripheral vascular disease) ([place])     Past Surgical History:  Procedure Laterality Date  . APPENDECTOMY    . arm surgery    . ELECTROPHYSIOLOGIC STUDY N/A 11/20/2016   Procedure: CARDIOVERSION;  Surgeon: Dionisio David, MD;  Location: [facility] ORS;  Service: Cardiovascular;  Laterality: N/A;     reports that he has been smoking.  He has been smoking about 0.00 packs per day. He has never used smokeless tobacco. He reports that he does not drink alcohol. His drug history is not on file.  No Known Allergies  Family history of mother with heart disease, father with COPD and sister with heart disease Family history has been reviewed and confirmed with patient.   Prior to Admission medications   Medication Sig Start Date End Date Taking? Authorizing Provider  digoxin (LANOXIN) 0.125 MG tablet Take 0.125 mg by mouth daily.    Historical Provider, MD  metoprolol (LOPRESSOR) 100 MG tablet Take 100 mg by mouth 2 (two) times daily.    Historical Provider, MD  Rivaroxaban (XARELTO) 15 MG TABS tablet Take 15 mg by mouth daily with supper.    Historical Provider, MD  sacubitril-valsartan (ENTRESTO) 49-51 MG Take 1 tablet by mouth 2 (two) times daily.    Historical Provider, MD  spironolactone (ALDACTONE) 25 MG tablet Take 25 mg by mouth daily.  Historical Provider, MD  torsemide (DEMADEX) 20 MG tablet Take 20 mg by mouth daily.    Historical Provider, MD    Physical Exam: Vitals:   01/24/17  2258 01/24/17 2300 01/24/17 2346 01/24/17 2355  BP: 119/67 124/69  124/75  Pulse: 84 82 93 92  Resp: 20   20  Temp:      TempSrc:      SpO2: 94%  (!) 88% 90%  Weight:      Height:        GENERAL: 76 y.o.-year-old White male patient, well-developed, well-nourished lying in the bed in no acute distress.  Pleasant and cooperative.   HEENT: Head atraumatic, normocephalic. Pupils equal, round, reactive to light and accommodation. No scleral icterus. Extraocular muscles intact. Nares are patent. Oropharynx is clear. Mucus membranes moist. NECK: Supple, full range of motion. No JVD, no bruit heard. No thyroid enlargement, no tenderness, no cervical lymphadenopathy. CHEST: Diminished air movement with bibasilar rhonchi. Slight expiratory wheezes diffusely. No use of accessory muscles of respiration.  No reproducible chest wall tenderness.  CARDIOVASCULAR: S1, S2 normal. No murmurs, rubs, or gallops. Cap refill <2 seconds. Pulses intact distally.  ABDOMEN: Soft, nondistended, nontender. No rebound, guarding, rigidity. Normoactive bowel sounds present in all four quadrants. No organomegaly or mass. EXTREMITIES: Positive changes of chronic vascular insufficiency bilateral lower extremities. No calf tenderness or Homan's sign.  NEUROLOGIC: The patient is alert and oriented x 3. Cranial nerves II through XII are grossly intact with no focal sensorimotor deficit. Muscle strength 5/5 in all extremities. Sensation intact. Gait not checked.   Labs on Admission:  CBC:  Recent Labs Lab 01/24/17 1737  WBC 16.2*  HGB 16.0  HCT 48.4  MCV 88.6  PLT [id number]   Basic Metabolic Panel:  Recent Labs Lab 01/24/17 1737  NA 137  K 3.9  CL 104  CO2 25  GLUCOSE 144*  BUN 16  CREATININE 1.07  CALCIUM 8.5*   GFR: Estimated Creatinine Clearance: 88.3 mL/min (by C-G formula based on SCr of 1.07 mg/dL). Liver Function Tests:  Recent Labs Lab 01/24/17 1737  AST 36  ALT 49  ALKPHOS 94  BILITOT 1.7*   PROT 7.8  ALBUMIN 3.7   No results for input(s): LIPASE, AMYLASE in the last 168 hours. No results for input(s): AMMONIA in the last 168 hours. Coagulation Profile: No results for input(s): INR, PROTIME in the last 168 hours. Cardiac Enzymes: No results for input(s): CKTOTAL, CKMB, CKMBINDEX, TROPONINI in the last 168 hours. BNP (last 3 results) No results for input(s): PROBNP in the last 8760 hours. HbA1C: No results for input(s): HGBA1C in the last 72 hours. CBG: No results for input(s): GLUCAP in the last 168 hours. Lipid Profile: No results for input(s): CHOL, HDL, LDLCALC, TRIG, CHOLHDL, LDLDIRECT in the last 72 hours. Thyroid Function Tests: No results for input(s): TSH, T4TOTAL, FREET4, T3FREE, THYROIDAB in the last 72 hours. Anemia Panel: No results for input(s): VITAMINB12, FOLATE, FERRITIN, TIBC, IRON, RETICCTPCT in the last 72 hours. Urine analysis: No results found for: COLORURINE, APPEARANCEUR, LABSPEC, PHURINE, GLUCOSEU, HGBUR, BILIRUBINUR, KETONESUR, PROTEINUR, UROBILINOGEN, NITRITE, LEUKOCYTESUR Sepsis Labs: @LABRCNTIP (procalcitonin:4,lacticidven:4) )No results found for this or any previous visit (from the past 240 hour(s)).   Radiological Exams on Admission: Dg Chest 2 View  Result Date: 01/24/2017 CLINICAL DATA:  Onset of shortness of breath last night, history hypertension, emphysema, smoker EXAM: CHEST  2 VIEW COMPARISON:  None FINDINGS: Normal heart size and pulmonary vascularity. Atherosclerotic calcification and tortuosity of thoracic  aorta. Lungs mildly hyperinflated with minimal central peribronchial thickening. No acute infiltrate, pleural effusion, or pneumothorax. Bones demineralized. IMPRESSION: COPD changes without acute infiltrate. Aortic atherosclerosis. Electronically Signed   By: Lavonia Dana M.D.   On: 01/24/2017 18:07    EKG: This tachycardia at 104 bpm with normal axis, PVCs and nonspecific ST-T wave changes.   Assessment/Plan Active  Problems:   Acute respiratory failure with hypoxia [facility])    This is a 76 y.o. male with a history of COPD, hypertension, peripheral vascular disease, atrial fibrillation status post cardioversion now being admitted with:  1. Acute hypoxic respiratory failure secondary to influenza A -Admit to observation -O2 and Med-Neb therapy as needed -Tamiflu -Follow up sputum cultures -Repeat labs in a.m.  2. History of COPD, with mild acute exacerbation -Continue Spiriva -Continue tapering IV steroids initiated in the emergency department - Nebulizers, O2 therapy and expectorants as needed.  - Continuous pulse oximetry - Consider pulmonary consult if not improving.   3. History of hypertension -Continue Lopressor and Entresto  4. History of atrial fibrillation status post cardioversion -Continue digoxin and Xarelto  5. 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 History of obstructive sleep apnea -CPAP  7. History of peripheral vascular disease with venous stasis ulcers  Admission status: Observation IV Fluids: Hep-Lock Diet/Nutrition: Heart healthy Consults called: None  DVT Px: Xarelto SCDs and early ambulation. Code Status: Full Code  Disposition Plan: To home in less than 24 hours   All the records are reviewed and case discussed with ED provider. Management plans discussed with the patient and/or family who express understanding and agree with plan of care.  Haileyann Staiger D.O. on 01/24/2017 at 11:58 PM Between 7am to 6pm - Pager - [phone] After 6pm go to [email or web address] - [employer] [facility]  Hospitalists Office [phone] CC: Primary care physician; Marden Noble, MD   01/24/2017, 11:58 PM

## 2017-01-24 NOTE — ED Provider Notes (Addendum)
Time Seen: Approximately 1854  I have reviewed the triage notes  Chief Complaint: Shortness of Breath   History of Present Illness: Stanley Marks is a 76 y.o. male who has a history of emphysema, peripheral vascular disease, etc. The patient states he's had increasing shortness of breath and a  cough with clear sputum now for the last 5 days. He denies any chest pain. He denies any change in his peripheral vascular disease versus his legs are both chronically dusky. Early on any home supplemental oxygen therapy. He states he is not on any DuoNeb so at home.  Patient is extremely poor historian. Past Medical History:  Diagnosis Date  . Cancer ([place])    skin  . Emphysema of lung ([place])   . Hypertension   . PVD (peripheral vascular disease) ([place])     There are no active problems to display for this patient.   Past Surgical History:  Procedure Laterality Date  . APPENDECTOMY    . arm surgery    . ELECTROPHYSIOLOGIC STUDY N/A 11/20/2016   Procedure: CARDIOVERSION;  Surgeon: Dionisio David, MD;  Location: [facility] ORS;  Service: Cardiovascular;  Laterality: N/A;    Past Surgical History:  Procedure Laterality Date  . APPENDECTOMY    . arm surgery    . ELECTROPHYSIOLOGIC STUDY N/A 11/20/2016   Procedure: CARDIOVERSION;  Surgeon: Dionisio David, MD;  Location: [facility] ORS;  Service: Cardiovascular;  Laterality: N/A;    Current Outpatient Rx  . Order #: [id number] Class: Historical Med  . Order #: [id number] Class: Historical Med  . Order #: [id number] Class: Historical Med  . Order #: [id number] Class: Historical Med  . Order #: [id number] Class: Historical Med  . Order #: [id number] Class: Historical Med    Allergies:  Patient has no known allergies.  Family History: No family history on file.  Social History: Social History  Substance Use Topics  . Smoking status: Current Every Day Smoker    Packs/day: 0.00  . Smokeless tobacco: Never Used  . Alcohol use No     Review of  Systems:   10 point review of systems was performed and was otherwise negative:  Constitutional: No fever Eyes: No visual disturbances ENT: No sore throat, ear pain Cardiac: No chest pain Respiratory: Shortness of breath and no audible wheezing or stridor at home Abdomen: No abdominal pain, no vomiting, No diarrhea Endocrine: No weight loss, No night sweats Extremities: No peripheral edema, cyanosis Skin: No rashes, easy bruising Neurologic: No focal weakness, trouble with speech or swollowing Urologic: No dysuria, Hematuria, or urinary frequency   Physical Exam:  ED Triage Vitals [01/24/17 1722]  Enc Vitals Group     BP 138/70     Pulse Rate (!) 103     Resp (!) 28     Temp 98.6 F (37 C)     Temp Source Oral     SpO2 (!) 87 %     Weight 295 lb (133.8 kg)     Height 6\' 1"  (1.854 m)     Head Circumference      Peak Flow      Pain Score      Pain Loc      Pain Edu?      Excl. in [place]?     General: Awake , Alert , and Oriented times 3; GCS 15 Head: Normal cephalic , atraumatic Eyes: Pupils equal , round, reactive to light Nose/Throat: No nasal drainage, patent upper airway without erythema or exudate.  Neck: Supple, Full  range of motion, No anterior adenopathy or palpable thyroid masses Lungs: Diminished breath sounds auscultated bilaterally at the bases with some rhonchi and no obvious wheezes noted.  Heart: Regular rate, regular rhythm without murmurs , gallops , or rubs Abdomen: Soft, non tender without rebound, guarding , or rigidity; bowel sounds positive and symmetric in all 4 quadrants. No organomegaly .        Extremities: Dusky appearance to both lower extremities that is chronic in nature  Neurologic: normal ambulation, Motor symmetric without deficits, sensory intact Skin: warm, dry, no rashes   Labs:   All laboratory work was reviewed including any pertinent negatives or positives listed below:  Labs Reviewed  CBC - Abnormal; Notable for the following:        Result Value   WBC 16.2 (*)    RDW 15.6 (*)    All other components within normal limits  COMPREHENSIVE METABOLIC PANEL - Abnormal; Notable for the following:    Glucose, Bld 144 (*)    Calcium 8.5 (*)    Total Bilirubin 1.7 (*)    All other components within normal limits  BRAIN NATRIURETIC PEPTIDE  INFLUENZA PANEL BY PCR (TYPE A & B)    EKG: ED ECG REPORT I, Daymon Larsen, the attending physician, personally viewed and interpreted this ECG.  Date: 01/24/2017 EKG Time: *1736 Rate: 104 Rhythm: normal sinus rhythm with occasional PVCs QRS Axis: normal Intervals: normal ST/T Wave abnormalities: Nonspecific ST-T wave abnormality Conduction Disturbances: none Narrative Interpretation: unremarkable No acute ischemic changes are noted   Radiology:  "Dg Chest 2 View  Result Date: 01/24/2017 CLINICAL DATA:  Onset of shortness of breath last night, history hypertension, emphysema, smoker EXAM: CHEST  2 VIEW COMPARISON:  None FINDINGS: Normal heart size and pulmonary vascularity. Atherosclerotic calcification and tortuosity of thoracic aorta. Lungs mildly hyperinflated with minimal central peribronchial thickening. No acute infiltrate, pleural effusion, or pneumothorax. Bones demineralized. IMPRESSION: COPD changes without acute infiltrate. Aortic atherosclerosis. Electronically Signed   By: Lavonia Dana M.D.   On: 01/24/2017 18:07  "  I personally reviewed the radiologic studies    ED Course:  Patient's influenza test was positive for influenza A and with his history of emphysema and presenting with hypoxia and shortness of breath I felt he required further inpatient observation. The patient's chest x-ray shows no findings of post-influenza pneumonia at this time. He was given a DuoNeb breathing treatment and IV steroids for bronchospasm.     Assessment: * Acute influenza Acute exacerbation of chronic obstructive pulmonary disease     Plan: * Inpatient  management           Daymon Larsen, MD 01/24/17 Singer Quigley, MD 01/24/17 2328

## 2017-01-25 LAB — CBC
HCT: 45.8 % (ref 40.0–52.0)
Hemoglobin: 15.8 g/dL (ref 13.0–18.0)
MCH: 30.5 pg (ref 26.0–34.0)
MCHC: 34.6 g/dL (ref 32.0–36.0)
MCV: 88.3 fL (ref 80.0–100.0)
PLATELETS: 170 10*3/uL (ref 150–440)
RBC: 5.19 MIL/uL (ref 4.40–5.90)
RDW: 15.5 % — AB (ref 11.5–14.5)
WBC: 11.8 10*3/uL — ABNORMAL HIGH (ref 3.8–10.6)

## 2017-01-25 LAB — BASIC METABOLIC PANEL
ANION GAP: 7 (ref 5–15)
BUN: 19 mg/dL (ref 6–20)
CALCIUM: 8.6 mg/dL — AB (ref 8.9–10.3)
CHLORIDE: 104 mmol/L (ref 101–111)
CO2: 27 mmol/L (ref 22–32)
CREATININE: 1.2 mg/dL (ref 0.61–1.24)
GFR, EST NON AFRICAN AMERICAN: 58 mL/min — AB (ref >60)
Glucose, Bld: 185 mg/dL — ABNORMAL HIGH (ref 65–99)
POTASSIUM: 4.1 mmol/L (ref 3.5–5.1)
SODIUM: 138 mmol/L (ref 135–145)

## 2017-01-25 MED ORDER — RIVAROXABAN 15 MG PO TABS: 15.0000 mg | ORAL_TABLET | Freq: Every day | ORAL | Status: DC

## 2017-01-25 MED ORDER — SODIUM CHLORIDE 0.9 % IV SOLN
INTRAVENOUS | Status: DC
  Administered 2017-01-25: 02:00:00 via INTRAVENOUS

## 2017-01-25 MED ORDER — IPRATROPIUM BROMIDE 0.02 % IN SOLN: 0.5000 mg | Freq: Four times a day (QID) | RESPIRATORY_TRACT | Status: DC | PRN

## 2017-01-25 MED ORDER — METHYLPREDNISOLONE SODIUM SUCC 125 MG IJ SOLR
60.0000 mg | Freq: Four times a day (QID) | INTRAMUSCULAR | Status: DC
  Administered 2017-01-25 – 2017-01-26 (×7): 60 mg via INTRAVENOUS
  Filled 2017-01-25 (×7): qty 2

## 2017-01-25 MED ORDER — ALBUTEROL SULFATE (2.5 MG/3ML) 0.083% IN NEBU
2.5000 mg | INHALATION_SOLUTION | Freq: Four times a day (QID) | RESPIRATORY_TRACT | Status: DC | PRN
  Administered 2017-01-25: 2.5 mg via RESPIRATORY_TRACT

## 2017-01-25 MED ORDER — DIGOXIN 125 MCG PO TABS
0.1250 mg | ORAL_TABLET | Freq: Every day | ORAL | Status: DC
Start: 2017-01-25 — End: 2017-01-28
  Administered 2017-01-25 – 2017-01-28 (×3): 0.125 mg via ORAL
  Filled 2017-01-25 (×5): qty 1

## 2017-01-25 MED ORDER — ALBUTEROL SULFATE (2.5 MG/3ML) 0.083% IN NEBU
INHALATION_SOLUTION | RESPIRATORY_TRACT | Status: AC
  Administered 2017-01-25: 2.5 mg via RESPIRATORY_TRACT
  Filled 2017-01-25: qty 3

## 2017-01-25 MED ORDER — METOPROLOL TARTRATE 50 MG PO TABS
100.0000 mg | ORAL_TABLET | Freq: Two times a day (BID) | ORAL | Status: DC
  Administered 2017-01-25 (×3): 100 mg via ORAL
  Filled 2017-01-25 (×4): qty 2

## 2017-01-25 MED ORDER — BUDESONIDE 0.25 MG/2ML IN SUSP
0.2500 mg | Freq: Two times a day (BID) | RESPIRATORY_TRACT | Status: DC
  Administered 2017-01-25 – 2017-01-28 (×6): 0.25 mg via RESPIRATORY_TRACT
  Filled 2017-01-25 (×7): qty 2

## 2017-01-25 MED ORDER — TORSEMIDE 20 MG PO TABS
20.0000 mg | ORAL_TABLET | Freq: Every day | ORAL | Status: DC
  Administered 2017-01-25 – 2017-01-28 (×4): 20 mg via ORAL
  Filled 2017-01-25 (×4): qty 1

## 2017-01-25 MED ORDER — SENNOSIDES-DOCUSATE SODIUM 8.6-50 MG PO TABS: 1.0000 | ORAL_TABLET | Freq: Every evening | ORAL | Status: DC | PRN

## 2017-01-25 MED ORDER — ACETAMINOPHEN 325 MG PO TABS: 650.0000 mg | ORAL_TABLET | Freq: Four times a day (QID) | ORAL | Status: DC | PRN

## 2017-01-25 MED ORDER — RIVAROXABAN 20 MG PO TABS
20.0000 mg | ORAL_TABLET | Freq: Every day | ORAL | Status: DC
  Administered 2017-01-25 – 2017-01-27 (×3): 20 mg via ORAL
  Filled 2017-01-25 (×3): qty 1

## 2017-01-25 MED ORDER — ONDANSETRON HCL 4 MG PO TABS: 4.0000 mg | ORAL_TABLET | Freq: Four times a day (QID) | ORAL | Status: DC | PRN

## 2017-01-25 MED ORDER — MAGNESIUM CITRATE PO SOLN: 1.0000 | Freq: Once | ORAL | Status: DC | PRN

## 2017-01-25 MED ORDER — SPIRONOLACTONE 25 MG PO TABS
25.0000 mg | ORAL_TABLET | Freq: Every day | ORAL | Status: DC
  Administered 2017-01-25 – 2017-01-28 (×4): 25 mg via ORAL
  Filled 2017-01-25 (×4): qty 1

## 2017-01-25 MED ORDER — GUAIFENESIN ER 600 MG PO TB12
600.0000 mg | ORAL_TABLET | Freq: Two times a day (BID) | ORAL | Status: DC
  Administered 2017-01-25 – 2017-01-28 (×7): 600 mg via ORAL
  Filled 2017-01-25 (×7): qty 1

## 2017-01-25 MED ORDER — OXYCODONE HCL 5 MG PO TABS: 5.0000 mg | ORAL_TABLET | ORAL | Status: DC | PRN

## 2017-01-25 MED ORDER — ACETAMINOPHEN 650 MG RE SUPP: 650.0000 mg | Freq: Four times a day (QID) | RECTAL | Status: DC | PRN

## 2017-01-25 MED ORDER — OSELTAMIVIR PHOSPHATE 75 MG PO CAPS
75.0000 mg | ORAL_CAPSULE | Freq: Two times a day (BID) | ORAL | Status: DC
  Administered 2017-01-25 – 2017-01-28 (×7): 75 mg via ORAL
  Filled 2017-01-25 (×7): qty 1

## 2017-01-25 MED ORDER — BISACODYL 5 MG PO TBEC: 5.0000 mg | DELAYED_RELEASE_TABLET | Freq: Every day | ORAL | Status: DC | PRN

## 2017-01-25 MED ORDER — SACUBITRIL-VALSARTAN 49-51 MG PO TABS
1.0000 | ORAL_TABLET | Freq: Two times a day (BID) | ORAL | Status: DC
  Administered 2017-01-25 – 2017-01-28 (×7): 1 via ORAL
  Filled 2017-01-25 (×9): qty 1

## 2017-01-25 MED ORDER — ONDANSETRON HCL 4 MG/2ML IJ SOLN: 4.0000 mg | Freq: Four times a day (QID) | INTRAMUSCULAR | Status: DC | PRN

## 2017-01-25 NOTE — Progress Notes (Signed)
Shift assessment completed at 0715.pt denied pain, is alert and oriented, in no distress. o2 on at [facility], pt denied feeling sob, lungs clear. Hr is regular at auscultation, abdomen is soft, bs hyperactive, audible without auscultation. Pt denied difficulty voiding, is able to ambulatd independently to the bathroom with steady gait. Bilat lower legs are discolored and some varicosity is noted when scd's are removed. PIV #20 intact to rac with ns infusing at 58mls/hr,site is free of redness and swelling. Pt has call bell in reach. Productive cough noted, pt has some nasal congestion. Dr. Posey Pronto in on rounds at 1125.

## 2017-01-25 NOTE — Plan of Care (Signed)
Problem: Bowel/Gastric: Goal: Will not experience complications related to bowel motility Outcome: Progressing Pt is progressing toward goals, has remained free of falls/injury this shift. o2 titrated to [facility], pt is sometimes sob with ambulation to and from bathroom, recovers quickly.

## 2017-01-25 NOTE — Progress Notes (Addendum)
[place] at [facility]                                                                                                                                                                                  Patient Demographics   Stanley Marks, is a 76 y.o. male, DOB - 09/29/1941, [id number]  Admit date - 01/24/2017   Admitting Physician Harvie Bridge, DO  Outpatient Primary MD for the patient is Marden Noble, MD   LOS - 0  Subjective: Patient admitted with shortness of breath cough from the flu He complains of cough and congestion and shortness of breath improved with oxygen His sats drop when he is not on oxygen therapy.    Review of Systems:   CONSTITUTIONAL: No documented fever. No fatigue, weakness. No weight gain, no weight loss.  EYES: No blurry or double vision.  ENT: No tinnitus. No postnasal drip. No redness of the oropharynx.  RESPIRATORY: Positive cough, positive wheeze, no hemoptysis. Positive dyspnea.  CARDIOVASCULAR: No chest pain. No orthopnea. No palpitations. No syncope.  GASTROINTESTINAL: No nausea, no vomiting or diarrhea. No abdominal pain. No melena or hematochezia.  GENITOURINARY: No dysuria or hematuria.  ENDOCRINE: No polyuria or nocturia. No heat or cold intolerance.  HEMATOLOGY: No anemia. No bruising. No bleeding.  INTEGUMENTARY: No rashes. No lesions.  MUSCULOSKELETAL: No arthritis. No swelling. No gout.  NEUROLOGIC: No numbness, tingling, or ataxia. No seizure-type activity.  PSYCHIATRIC: No anxiety. No insomnia. No ADD.    Vitals:   Vitals:   01/25/17 0006 01/25/17 0112 01/25/17 0442 01/25/17 0821  BP: 106/64 129/64 135/64 127/81  Pulse: 89 89 73 (!) 58  Resp: 18 18 19 18   Temp:  97.5 F (36.4 C) 97.5 F (36.4 C)   TempSrc:  Oral Oral   SpO2: 93% 94% 93% 94%  Weight:      Height:        Wt Readings from Last 3 Encounters:  01/24/17 295 lb (133.8 kg)  08/07/15 (!) 318 lb 6.4 oz (144.4 kg)  08/04/14  300 lb (136.1 kg)    No intake or output data in the 24 hours ending 01/25/17 1427  Physical Exam:   GENERAL: Pleasant-appearing in no apparent distress.  HEAD, EYES, EARS, NOSE AND THROAT: Atraumatic, normocephalic. Extraocular muscles are intact. Pupils equal and reactive to light. Sclerae anicteric. No conjunctival injection. No oro-pharyngeal erythema.  NECK: Supple. There is no jugular venous distention. No bruits, no lymphadenopathy, no thyromegaly.  HEART: Regular rate and rhythm,. No murmurs, no rubs, no clicks.  LUNGS: Occasional wheezing. No rales or rhonchi. No wheezes.  ABDOMEN: Soft, flat, nontender, nondistended. Has good bowel  sounds. No hepatosplenomegaly appreciated.  EXTREMITIES: No evidence of any cyanosis, clubbing, or peripheral edema.  +2 pedal and radial pulses bilaterally.  NEUROLOGIC: The patient is alert, awake, and oriented x3 with no focal motor or sensory deficits appreciated bilaterally.  SKIN: Moist and warm with no rashes appreciated.  Psych: Not anxious, depressed LN: No inguinal LN enlargement    Antibiotics   Anti-infectives    Start     Dose/Rate Route Frequency Ordered Stop   01/25/17 1000  oseltamivir (TAMIFLU) capsule 75 mg     75 mg Oral 2 times daily 01/25/17 0105 01/30/17 0959   01/24/17 2200  oseltamivir (TAMIFLU) capsule 75 mg     75 mg Oral  Once 01/24/17 2146 01/24/17 2256      Medications   Scheduled Meds: . budesonide (PULMICORT) nebulizer solution  0.25 mg Nebulization BID  . digoxin  0.125 mg Oral Daily  . guaiFENesin  600 mg Oral BID  . methylPREDNISolone (SOLU-MEDROL) injection  60 mg Intravenous Q6H  . metoprolol  100 mg Oral BID  . oseltamivir  75 mg Oral BID  . Rivaroxaban  20 mg Oral Q supper  . sacubitril-valsartan  1 tablet Oral BID  . spironolactone  25 mg Oral Daily  . torsemide  20 mg Oral Daily   Continuous Infusions: PRN Meds:.acetaminophen **OR** acetaminophen, albuterol, bisacodyl, ipratropium, magnesium  citrate, ondansetron **OR** ondansetron (ZOFRAN) IV, oxyCODONE, senna-docusate   Data Review:   Micro Results No results found for this or any previous visit (from the past 240 hour(s)).  Radiology Reports Dg Chest 2 View  Result Date: 01/24/2017 CLINICAL DATA:  Onset of shortness of breath last night, history hypertension, emphysema, smoker EXAM: CHEST  2 VIEW COMPARISON:  None FINDINGS: Normal heart size and pulmonary vascularity. Atherosclerotic calcification and tortuosity of thoracic aorta. Lungs mildly hyperinflated with minimal central peribronchial thickening. No acute infiltrate, pleural effusion, or pneumothorax. Bones demineralized. IMPRESSION: COPD changes without acute infiltrate. Aortic atherosclerosis. Electronically Signed   By: Lavonia Dana M.D.   On: 01/24/2017 18:07     CBC  Recent Labs Lab 01/24/17 1737 01/25/17 0550  WBC 16.2* 11.8*  HGB 16.0 15.8  HCT 48.4 45.8  PLT 172 170  MCV 88.6 88.3  MCH 29.4 30.5  MCHC 33.2 34.6  RDW 15.6* 15.5*    Chemistries   Recent Labs Lab 01/24/17 1737 01/25/17 0550  NA 137 138  K 3.9 4.1  CL 104 104  CO2 25 27  GLUCOSE 144* 185*  BUN 16 19  CREATININE 1.07 1.20  CALCIUM 8.5* 8.6*  AST 36  --   ALT 49  --   ALKPHOS 94  --   BILITOT 1.7*  --    ------------------------------------------------------------------------------------------------------------------ estimated creatinine clearance is 78.7 mL/min (by C-G formula based on SCr of 1.2 mg/dL). ------------------------------------------------------------------------------------------------------------------ No results for input(s): HGBA1C in the last 72 hours. ------------------------------------------------------------------------------------------------------------------ No results for input(s): CHOL, HDL, LDLCALC, TRIG, CHOLHDL, LDLDIRECT in the last 72  hours. ------------------------------------------------------------------------------------------------------------------ No results for input(s): TSH, T4TOTAL, T3FREE, THYROIDAB in the last 72 hours.  Invalid input(s): FREET3 ------------------------------------------------------------------------------------------------------------------ No results for input(s): VITAMINB12, FOLATE, FERRITIN, TIBC, IRON, RETICCTPCT in the last 72 hours.  Coagulation profile No results for input(s): INR, PROTIME in the last 168 hours.  No results for input(s): DDIMER in the last 72 hours.  Cardiac Enzymes No results for input(s): CKMB, TROPONINI, MYOGLOBIN in the last 168 hours.  Invalid input(s): CK ------------------------------------------------------------------------------------------------------------------ Invalid input(s): [place]  This is a 76 y.o. male with a history of COPD, hypertension, peripheral vascular disease, atrial fibrillation status post cardioversion now being admitted with:  1. Acute hypoxic respiratory failure secondary to influenza A -Continue O2 --Nebulizer therapy every 6 hours -Tamiflu -Follow up sputum cultures   2. Acute on chronic COPD exasperation -Continue Spiriva -Continue tapering IV steroids  - Nebulizers, O2 therapy and expectorants as needed.  - I will add Mucinex and cough syrup to his regimen   3. Essential hypertension -Continue Lopressor and Entresto  4. History of atrial fibrillation status post cardioversion -Continue digoxin and Xarelto  5. 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 History of obstructive sleep apnea -CPAP at bedtime  7. History of peripheral vascular disease with venous stasis   8, nicotine addition smoking cessation provided, 5min spent, recommend pt stop smoking     Code  Status Orders        Start     Ordered   01/25/17 0106  Full code  Continuous     01/25/17 0105    Code Status History    Date Active Date Inactive Code Status Order ID Comments User Context   This patient has a current code status but no historical code status.           Consults  none   DVT Prophylaxis  Lovenox    Lab Results  Component Value Date   PLT 170 01/25/2017     Time Spent in minutes   88min  Greater than 50% of time spent in care coordination and counseling patient regarding the condition and plan of care.   Dustin Flock M.D on 01/25/2017 at 2:27 PM  Between 7am to 6pm - Pager - [phone]  After 6pm go to [email or web address] - password [email or web address] [place]   Office  [phone]

## 2017-01-25 NOTE — Care Management Obs Status (Signed)
[facility] NOTIFICATION   Patient Details  Name: Stanley Marks MRN: [id number] Date of Birth: 09-Jun-1941   Medicare Observation Status Notification Given:  Yes    Jolly Mango, RN 01/25/2017, 3:51 PM

## 2017-01-25 NOTE — Evaluation (Signed)
Physical Therapy Evaluation Patient Details Name: Stanley Marks MRN: [id number] DOB: 11/01/1941 Today's Date: 01/25/2017   History of Present Illness  76 y.o. male with a known history of COPD, hypertension, peripheral vascular disease, atrial fibrillation status post cardioversion 12/17    Clinical Impression  Pt did well with ambulation in the room and had good safety and confidence.  He did have a drop in O2 (on room air, per pt request) to the mid 80s with ~75 ft of walking w/o AD, but did not c/o fatigue. Pt eager to go home and fortunately does not require further PT intervention as he was completely uninterested in further PT f/u.  Pt safe to go home from a mobility, safety stand-point, PT orders will be completed.     Follow Up Recommendations No PT follow up    Equipment Recommendations  None recommended by PT    Recommendations for Other Services       Precautions / Restrictions Restrictions Weight Bearing Restrictions: No      Mobility  Bed Mobility Overal bed mobility: Independent                Transfers Overall transfer level: Independent Equipment used: None             General transfer comment: Pt is able to rise to standing w/o assist  Ambulation/Gait Ambulation/Gait assistance: Supervision Ambulation Distance (Feet): 80 Feet Assistive device: None       General Gait Details: Pt walks with good confidence and safety and though he had some minimal fatigue (drop in O2 from low 90s to mid 80s) he did not report excessive fatigue and stated that he felt near his baseline.   Stairs            Wheelchair Mobility    Modified Rankin (Stroke Patients Only)       Balance Overall balance assessment: Independent                                           Pertinent Vitals/Pain Pain Assessment: No/denies pain    Home Living Family/patient expects to be discharged to:: Private residence Living Arrangements:  Spouse/significant other;Other relatives Available Help at Discharge: Family   Home Access: Level entry       Home Equipment: None      Prior Function Level of Independence: Independent               Hand Dominance        Extremity/Trunk Assessment   Upper Extremity Assessment Upper Extremity Assessment: Overall WFL for tasks assessed    Lower Extremity Assessment Lower Extremity Assessment: Overall WFL for tasks assessed       Communication   Communication: No difficulties  Cognition Arousal/Alertness: Awake/alert Behavior During Therapy: WFL for tasks assessed/performed Overall Cognitive Status: Within Functional Limits for tasks assessed                      General Comments      Exercises     Assessment/Plan    PT Assessment Patent does not need any further PT services  PT Problem List            PT Treatment Interventions      PT Goals (Current goals can be found in the Care Plan section)  Acute Rehab PT Goals Patient Stated Goal: go home today  PT Goal Formulation: With patient/family    Frequency     Barriers to discharge        Co-evaluation               End of Session Equipment Utilized During Treatment: Gait belt Activity Tolerance: Patient tolerated treatment well Patient left: in bed;with call bell/phone within reach      Functional Assessment Tool Used: clinical judgement Functional Limitation: Mobility: Walking and moving around Mobility: Walking and Moving Around Current Status [phone]): 0 percent impaired, limited or restricted Mobility: Walking and Moving Around Goal Status [phone]): 0 percent impaired, limited or restricted Mobility: Walking and Moving Around Discharge Status [phone]): 0 percent impaired, limited or restricted    Time: [id number] PT Time Calculation (min) (ACUTE ONLY): 15 min   Charges:   PT Evaluation $PT Eval Low Complexity: 1 Procedure     PT G Codes:   PT G-Codes **NOT FOR  INPATIENT CLASS** Functional Assessment Tool Used: clinical judgement Functional Limitation: Mobility: Walking and moving around Mobility: Walking and Moving Around Current Status [id number]): 0 percent impaired, limited or restricted Mobility: Walking and Moving Around Goal Status [id number]): 0 percent impaired, limited or restricted Mobility: Walking and Moving Around Discharge Status [id number]): 0 percent impaired, limited or restricted    Kreg Shropshire, DPT 01/25/2017, 10:44 AM

## 2017-01-26 ENCOUNTER — Observation Stay: Payer: Medicare Other

## 2017-01-26 DIAGNOSIS — I5022 Chronic systolic (congestive) heart failure: Secondary | ICD-10-CM | POA: Diagnosis present

## 2017-01-26 DIAGNOSIS — J1000 Influenza due to other identified influenza virus with unspecified type of pneumonia: Secondary | ICD-10-CM | POA: Diagnosis present

## 2017-01-26 DIAGNOSIS — I4891 Unspecified atrial fibrillation: Secondary | ICD-10-CM | POA: Diagnosis present

## 2017-01-26 DIAGNOSIS — J9610 Chronic respiratory failure, unspecified whether with hypoxia or hypercapnia: Secondary | ICD-10-CM | POA: Diagnosis present

## 2017-01-26 DIAGNOSIS — Z8249 Family history of ischemic heart disease and other diseases of the circulatory system: Secondary | ICD-10-CM | POA: Diagnosis not present

## 2017-01-26 DIAGNOSIS — R001 Bradycardia, unspecified: Secondary | ICD-10-CM | POA: Diagnosis not present

## 2017-01-26 DIAGNOSIS — F17210 Nicotine dependence, cigarettes, uncomplicated: Secondary | ICD-10-CM | POA: Diagnosis present

## 2017-01-26 DIAGNOSIS — Z9119 Patient's noncompliance with other medical treatment and regimen: Secondary | ICD-10-CM | POA: Diagnosis not present

## 2017-01-26 DIAGNOSIS — J440 Chronic obstructive pulmonary disease with acute lower respiratory infection: Secondary | ICD-10-CM | POA: Diagnosis present

## 2017-01-26 DIAGNOSIS — Z79899 Other long term (current) drug therapy: Secondary | ICD-10-CM | POA: Diagnosis not present

## 2017-01-26 DIAGNOSIS — J101 Influenza due to other identified influenza virus with other respiratory manifestations: Secondary | ICD-10-CM | POA: Diagnosis present

## 2017-01-26 DIAGNOSIS — I739 Peripheral vascular disease, unspecified: Secondary | ICD-10-CM | POA: Diagnosis present

## 2017-01-26 DIAGNOSIS — I878 Other specified disorders of veins: Secondary | ICD-10-CM | POA: Diagnosis present

## 2017-01-26 DIAGNOSIS — J9621 Acute and chronic respiratory failure with hypoxia: Secondary | ICD-10-CM | POA: Diagnosis present

## 2017-01-26 DIAGNOSIS — Z85828 Personal history of other malignant neoplasm of skin: Secondary | ICD-10-CM | POA: Diagnosis not present

## 2017-01-26 DIAGNOSIS — I110 Hypertensive heart disease with heart failure: Secondary | ICD-10-CM | POA: Diagnosis present

## 2017-01-26 DIAGNOSIS — G4733 Obstructive sleep apnea (adult) (pediatric): Secondary | ICD-10-CM | POA: Diagnosis present

## 2017-01-26 DIAGNOSIS — J441 Chronic obstructive pulmonary disease with (acute) exacerbation: Secondary | ICD-10-CM | POA: Diagnosis present

## 2017-01-26 DIAGNOSIS — Z7901 Long term (current) use of anticoagulants: Secondary | ICD-10-CM | POA: Diagnosis not present

## 2017-01-26 DIAGNOSIS — I429 Cardiomyopathy, unspecified: Secondary | ICD-10-CM | POA: Diagnosis present

## 2017-01-26 MED ORDER — IPRATROPIUM-ALBUTEROL 0.5-2.5 (3) MG/3ML IN SOLN
3.0000 mL | RESPIRATORY_TRACT | Status: DC
  Administered 2017-01-26 – 2017-01-27 (×6): 3 mL via RESPIRATORY_TRACT
  Filled 2017-01-26 (×6): qty 3

## 2017-01-26 MED ORDER — IPRATROPIUM BROMIDE 0.02 % IN SOLN: 0.5000 mg | RESPIRATORY_TRACT | Status: DC

## 2017-01-26 MED ORDER — ALBUTEROL SULFATE (2.5 MG/3ML) 0.083% IN NEBU: 2.5000 mg | INHALATION_SOLUTION | RESPIRATORY_TRACT | Status: DC

## 2017-01-26 MED ORDER — IPRATROPIUM-ALBUTEROL 0.5-2.5 (3) MG/3ML IN SOLN
3.0000 mL | RESPIRATORY_TRACT | Status: DC | PRN
Start: 2017-01-26 — End: 2017-01-26

## 2017-01-26 MED ORDER — METOPROLOL TARTRATE 50 MG PO TABS
50.0000 mg | ORAL_TABLET | Freq: Two times a day (BID) | ORAL | Status: DC
  Administered 2017-01-26 – 2017-01-28 (×3): 50 mg via ORAL
  Filled 2017-01-26 (×4): qty 1

## 2017-01-26 MED ORDER — METHYLPREDNISOLONE SODIUM SUCC 125 MG IJ SOLR
60.0000 mg | Freq: Two times a day (BID) | INTRAMUSCULAR | Status: DC
Start: 2017-01-27 — End: 2017-01-28
  Administered 2017-01-27 – 2017-01-28 (×3): 60 mg via INTRAVENOUS
  Filled 2017-01-26 (×3): qty 2

## 2017-01-26 NOTE — Progress Notes (Signed)
Patient a&o, vss. Droplet precautions maintained. No complaints at this time. Continue to monitor.

## 2017-01-26 NOTE — Progress Notes (Signed)
[place] at [facility]                                                                                                                                                                                  Patient Demographics   Regory Cluney, is a 76 y.o. male, DOB - [birth year], [id number]  Admit date - 01/24/2017   Admitting Physician Harvie Bridge, DO  Outpatient Primary MD for the patient is Marden Noble, MD   LOS - 0  Subjective: Patient admitted with shortness of breath cough from the flu He complains of cough and congestion and shortness of breath improved with oxygen His sats drop when he is not on oxygen therapy. Continues to have intermittent cough with no significant sputum production, some wheezing, less prominent than it was yesterday. Remains on 2 L of oxygen through nasal cannula, not on oxygen at home    Review of Systems:   CONSTITUTIONAL: No documented fever. No fatigue, weakness. No weight gain, no weight loss.  EYES: No blurry or double vision.  ENT: No tinnitus. No postnasal drip. No redness of the oropharynx.  RESPIRATORY: Positive cough, some wheeze, no hemoptysis. Positive dyspnea.  CARDIOVASCULAR: No chest pain. No orthopnea. No palpitations. No syncope.  GASTROINTESTINAL: No nausea, no vomiting or diarrhea. No abdominal pain. No melena or hematochezia.  GENITOURINARY: No dysuria or hematuria.  ENDOCRINE: No polyuria or nocturia. No heat or cold intolerance.  HEMATOLOGY: No anemia. No bruising. No bleeding.  INTEGUMENTARY: No rashes. No lesions.  MUSCULOSKELETAL: No arthritis. No swelling. No gout.  NEUROLOGIC: No numbness, tingling, or ataxia. No seizure-type activity.  PSYCHIATRIC: No anxiety. No insomnia. No ADD.    Vitals:   Vitals:   01/26/17 0425 01/26/17 0809 01/26/17 0816 01/26/17 0855  BP: 99/63 116/60    Pulse: (!) 49 (!) 47  (!) 54  Resp: 16 20    Temp: 97.1 F (36.2 C) 98.6 F (37 C)    TempSrc:       SpO2: 91% 92% 92%   Weight:      Height:        Wt Readings from Last 3 Encounters:  01/24/17 133.8 kg (295 lb)  08/07/15 (!) 144.4 kg (318 lb 6.4 oz)  08/04/14 136.1 kg (300 lb)     Intake/Output Summary (Last 24 hours) at 01/26/17 1333 Last data filed at 01/26/17 0749  Gross per 24 hour  Intake                0 ml  Output                0 ml  Net  0 ml    Physical Exam:   GENERAL: Pleasant-appearing in no apparent distress, Intermittent pursed lip breathing, with exertion and long speech.  HEAD, EYES, EARS, NOSE AND THROAT: Atraumatic, normocephalic. Extraocular muscles are intact. Pupils equal and reactive to light. Sclerae anicteric. No conjunctival injection. No oro-pharyngeal erythema.  NECK: Supple. There is no jugular venous distention. No bruits, no lymphadenopathy, no thyromegaly.  HEART: Regular rate and rhythm,. No murmurs, no rubs, no clicks.  LUNGS: Occasional wheezing. No rales or rhonchi. No wheezes. Markedly diminished breath sounds bilaterally ABDOMEN: Soft, flat, nontender, nondistended. Has good bowel sounds. No hepatosplenomegaly appreciated.  EXTREMITIES: No evidence of any cyanosis, clubbing, or peripheral edema.  +2 pedal and radial pulses bilaterally.  NEUROLOGIC: The patient is alert, awake, and oriented x3 with no focal motor or sensory deficits appreciated bilaterally.  SKIN: Moist and warm with no rashes appreciated.  Psych: Not anxious, depressed LN: No inguinal LN enlargement    Antibiotics   Anti-infectives    Start     Dose/Rate Route Frequency Ordered Stop   01/25/17 1000  oseltamivir (TAMIFLU) capsule 75 mg     75 mg Oral 2 times daily 01/25/17 0105 01/30/17 0959   01/24/17 2200  oseltamivir (TAMIFLU) capsule 75 mg     75 mg Oral  Once 01/24/17 2146 01/24/17 2256      Medications   Scheduled Meds: . budesonide (PULMICORT) nebulizer solution  0.25 mg Nebulization BID  . digoxin  0.125 mg Oral Daily  . guaiFENesin   600 mg Oral BID  . methylPREDNISolone (SOLU-MEDROL) injection  60 mg Intravenous Q6H  . metoprolol  100 mg Oral BID  . oseltamivir  75 mg Oral BID  . Rivaroxaban  20 mg Oral Q supper  . sacubitril-valsartan  1 tablet Oral BID  . spironolactone  25 mg Oral Daily  . torsemide  20 mg Oral Daily   Continuous Infusions: PRN Meds:.acetaminophen **OR** acetaminophen, bisacodyl, ipratropium-albuterol, magnesium citrate, ondansetron **OR** ondansetron (ZOFRAN) IV, oxyCODONE, senna-docusate   Data Review:   Micro Results No results found for this or any previous visit (from the past 240 hour(s)).  Radiology Reports Dg Chest 2 View  Result Date: 01/24/2017 CLINICAL DATA:  Onset of shortness of breath last night, history hypertension, emphysema, smoker EXAM: CHEST  2 VIEW COMPARISON:  None FINDINGS: Normal heart size and pulmonary vascularity. Atherosclerotic calcification and tortuosity of thoracic aorta. Lungs mildly hyperinflated with minimal central peribronchial thickening. No acute infiltrate, pleural effusion, or pneumothorax. Bones demineralized. IMPRESSION: COPD changes without acute infiltrate. Aortic atherosclerosis. Electronically Signed   By: Lavonia Dana M.D.   On: 01/24/2017 18:07     CBC  Recent Labs Lab 01/24/17 1737 01/25/17 0550  WBC 16.2* 11.8*  HGB 16.0 15.8  HCT 48.4 45.8  PLT 172 170  MCV 88.6 88.3  MCH 29.4 30.5  MCHC 33.2 34.6  RDW 15.6* 15.5*    Chemistries   Recent Labs Lab 01/24/17 1737 01/25/17 0550  NA 137 138  K 3.9 4.1  CL 104 104  CO2 25 27  GLUCOSE 144* 185*  BUN 16 19  CREATININE 1.07 1.20  CALCIUM 8.5* 8.6*  AST 36  --   ALT 49  --   ALKPHOS 94  --   BILITOT 1.7*  --    ------------------------------------------------------------------------------------------------------------------ estimated creatinine clearance is 78.7 mL/min (by C-G formula based on SCr of 1.2  mg/dL). ------------------------------------------------------------------------------------------------------------------ No results for input(s): HGBA1C in the last 72 hours. ------------------------------------------------------------------------------------------------------------------ No results for input(s):  CHOL, HDL, LDLCALC, TRIG, CHOLHDL, LDLDIRECT in the last 72 hours. ------------------------------------------------------------------------------------------------------------------ No results for input(s): TSH, T4TOTAL, T3FREE, THYROIDAB in the last 72 hours.  Invalid input(s): FREET3 ------------------------------------------------------------------------------------------------------------------ No results for input(s): VITAMINB12, FOLATE, FERRITIN, TIBC, IRON, RETICCTPCT in the last 72 hours.  Coagulation profile No results for input(s): INR, PROTIME in the last 168 hours.  No results for input(s): DDIMER in the last 72 hours.  Cardiac Enzymes No results for input(s): CKMB, TROPONINI, MYOGLOBIN in the last 168 hours.  Invalid input(s): CK ------------------------------------------------------------------------------------------------------------------ Invalid input(s): [place]   This is a 76 y.o. male with a history of COPD, hypertension, peripheral vascular disease, atrial fibrillation status post cardioversion now being admitted with:  1. Acute on chronic, likely hypoxic respiratory failure secondary to influenza A -Continue O2 --Nebulizer therapy every 4 hours, adding Pulmicort -Tamiflu -Follow up sputum cultures   2. Acute on chronic COPD exasperation -Continue duo nebs, Pulmicort, intravenous steroids -Continue tapering IV steroids  - Nebulizers, O2 therapy and expectorants as needed.    Follow Clinically, weaning off oxygen as tolerated   3. Essential hypertension -Continue Lopressor and Entresto, decrease Lopressor dose since  patient's heart rate ranges in high 40s to 50s  4. History of atrial fibrillation status post cardioversion -Continue digoxin and Xarelto  5. 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 History of obstructive sleep apnea -CPAP at bedtime  7. History of peripheral vascular disease with venous stasis   8, nicotine addition smoking cessation provided, 3min spent, recommend pt stop smoking. I discussed again today  9. Leukocytosis, improving with therapy  10 influenza A, continue Tamiflu to complete course    Code Status Orders        Start     Ordered   01/25/17 0106  Full code  Continuous     01/25/17 0105    Code Status History    Date Active Date Inactive Code Status Order ID Comments User Context   This patient has a current code status but no historical code status.           Consults  none   DVT Prophylaxis  Lovenox    Lab Results  Component Value Date   PLT 170 01/25/2017     Time Spent in minutes   40 min Dc planning was discussed, patient was advised that unless he is diagnosed with chronic respiratory failure, we are not going to be discharging him on oxygen therapy, all questions were answered  Mauriana Dann M.D on 01/26/2017 at 1:33 PM  Between 7am to 6pm - Pager - [phone]  After 6pm go to [email or web address] - password [email or web address] [facility]   Office  [phone]

## 2017-01-26 NOTE — Progress Notes (Signed)
SATURATION QUALIFICATIONS: (This note is used to comply with regulatory documentation for home oxygen)  Patient Saturations on Oxygen 1 Liter = 91%  Patient Saturations on Room Air at Rest = 91%   Patient Saturations on Room Air while Ambulating = 86%  Patient Saturations on 1 Liters of oxygen while Ambulating = 88%  Patient Saturations on 2 Liters of oxygen while Ambulating = 92%

## 2017-01-27 LAB — PROCALCITONIN: Procalcitonin: 0.19 ng/mL

## 2017-01-27 LAB — DIGOXIN LEVEL: Digoxin Level: <0.2 ng/mL — ABNORMAL LOW (ref 0.8–2.0)

## 2017-01-27 MED ORDER — IPRATROPIUM-ALBUTEROL 0.5-2.5 (3) MG/3ML IN SOLN
3.0000 mL | Freq: Four times a day (QID) | RESPIRATORY_TRACT | Status: DC
  Administered 2017-01-28: 3 mL via RESPIRATORY_TRACT
  Filled 2017-01-27: qty 3

## 2017-01-27 MED ORDER — IPRATROPIUM-ALBUTEROL 0.5-2.5 (3) MG/3ML IN SOLN: 3.0000 mL | RESPIRATORY_TRACT | Status: DC | PRN

## 2017-01-27 MED ORDER — LEVOFLOXACIN IN D5W 750 MG/150ML IV SOLN
750.0000 mg | Freq: Every day | INTRAVENOUS | Status: DC
  Administered 2017-01-27 – 2017-01-28 (×2): 750 mg via INTRAVENOUS
  Filled 2017-01-27 (×2): qty 150

## 2017-01-27 NOTE — Progress Notes (Signed)
Digoxin level 0.2, Per Dr Ether Griffins, ok to give Digoxin for today.

## 2017-01-27 NOTE — Progress Notes (Signed)
Patient resting in bed. Digoxin given. Oxygen via nasal cannula at 2 liters. No complaints of pain or distress at the moment. Continue to monitor.

## 2017-01-27 NOTE — Progress Notes (Signed)
[facility]                                                                                                                                                                                  Patient Demographics   Stanley Marks, is a 76 y.o. male, DOB - 1941-05-17, HX:[id number]  Admit date - 01/24/2017   Admitting Physician Harvie Bridge, DO  Outpatient Primary MD for the patient is Marden Noble, MD    Subjective: Patient admitted with shortness of breath cough from the flu He complains of cough and congestion and shortness of breath improved with oxygen His sats drop when he is not on oxygen therapy. Continues to have intermittent cough with no significant sputum production, No more wheezing. Remains on 2 L of oxygen through nasal cannula, not on oxygen at home. CT of the chest revealed pneumonia, now initiated on levofloxacin intravenously. Feels overall better, stronger, wants to go home. CT of the chest revealed emphysema, patient qualified for oxygen therapy at home    Review of Systems:   CONSTITUTIONAL: No documented fever. No fatigue, weakness. No weight gain, no weight loss.  EYES: No blurry or double vision.  ENT: No tinnitus. No postnasal drip. No redness of the oropharynx.  RESPIRATORY: Positive cough, some wheeze, no hemoptysis. Positive dyspnea.  CARDIOVASCULAR: No chest pain. No orthopnea. No palpitations. No syncope.  GASTROINTESTINAL: No nausea, no vomiting or diarrhea. No abdominal pain. No melena or hematochezia.  GENITOURINARY: No dysuria or hematuria.  ENDOCRINE: No polyuria or nocturia. No heat or cold intolerance.  HEMATOLOGY: No anemia. No bruising. No bleeding.  INTEGUMENTARY: No rashes. No lesions.  MUSCULOSKELETAL: No arthritis. No swelling. No gout.  NEUROLOGIC: No numbness, tingling, or ataxia. No seizure-type activity.  PSYCHIATRIC: No anxiety. No insomnia. No ADD.    Vitals:   Vitals:   01/27/17 0533  01/27/17 0745 01/27/17 0831 01/27/17 1156  BP: (!) 110/55 (!) 104/48    Pulse: (!) 52 (!) 51    Resp: 18 18    Temp: 97.3 F (36.3 C) 97.5 F (36.4 C)    TempSrc: Oral Oral    SpO2: 94% 90% 90% 91%  Weight:      Height:        Wt Readings from Last 3 Encounters:  01/24/17 133.8 kg (295 lb)  08/07/15 (!) 144.4 kg (318 lb 6.4 oz)  08/04/14 136.1 kg (300 lb)     Intake/Output Summary (Last 24 hours) at 01/27/17 1246 Last data filed at 01/27/17 0721  Gross per 24 hour  Intake              480 ml  Output                0 ml  Net              480 ml    Physical Exam:   GENERAL: Pleasant-appearing in no apparent distress, Intermittent pursed lip breathing, with exertion and long speech.  HEAD, EYES, EARS, NOSE AND THROAT: Atraumatic, normocephalic. Extraocular muscles are intact. Pupils equal and reactive to light. Sclerae anicteric. No conjunctival injection. No oro-pharyngeal erythema.  NECK: Supple. There is no jugular venous distention. No bruits, no lymphadenopathy, no thyromegaly.  HEART: Regular rate and rhythm,. No murmurs, no rubs, no clicks.  LUNGS: Occasional Crackles. No rales or rhonchi. No wheezes. Some diminished breath sounds bilaterally, especially posteriorly, better than yesterday, intermittent wheezes ABDOMEN: Soft, flat, nontender, nondistended. Has good bowel sounds. No hepatosplenomegaly appreciated.  EXTREMITIES: No evidence of any cyanosis, clubbing, or peripheral edema.  +2 pedal and radial pulses bilaterally.  NEUROLOGIC: The patient is alert, awake, and oriented x3 with no focal motor or sensory deficits appreciated bilaterally.  SKIN: Moist and warm with no rashes appreciated.  Psych: Not anxious, depressed LN: No inguinal LN enlargement    Antibiotics   Anti-infectives    Start     Dose/Rate Route Frequency Ordered Stop   01/27/17 0830  levofloxacin (LEVAQUIN) IVPB 750 mg     750 mg 100 mL/hr over 90 Minutes Intravenous Daily 01/27/17 0734      01/25/17 1000  oseltamivir (TAMIFLU) capsule 75 mg     75 mg Oral 2 times daily 01/25/17 0105 01/30/17 0959   01/24/17 2200  oseltamivir (TAMIFLU) capsule 75 mg     75 mg Oral  Once 01/24/17 2146 01/24/17 2256      Medications   Scheduled Meds: . budesonide (PULMICORT) nebulizer solution  0.25 mg Nebulization BID  . digoxin  0.125 mg Oral Daily  . guaiFENesin  600 mg Oral BID  . ipratropium-albuterol  3 mL Nebulization Q4H  . levofloxacin (LEVAQUIN) IV  750 mg Intravenous Daily  . methylPREDNISolone (SOLU-MEDROL) injection  60 mg Intravenous Q12H  . metoprolol  50 mg Oral BID  . oseltamivir  75 mg Oral BID  . Rivaroxaban  20 mg Oral Q supper  . sacubitril-valsartan  1 tablet Oral BID  . spironolactone  25 mg Oral Daily  . torsemide  20 mg Oral Daily   Continuous Infusions: PRN Meds:.acetaminophen **OR** acetaminophen, bisacodyl, magnesium citrate, ondansetron **OR** ondansetron (ZOFRAN) IV, oxyCODONE, senna-docusate   Data Review:   Micro Results No results found for this or any previous visit (from the past 240 hour(s)).  Radiology Reports Dg Chest 2 View  Result Date: 01/24/2017 CLINICAL DATA:  Onset of shortness of breath last night, history hypertension, emphysema, smoker EXAM: CHEST  2 VIEW COMPARISON:  None FINDINGS: Normal heart size and pulmonary vascularity. Atherosclerotic calcification and tortuosity of thoracic aorta. Lungs mildly hyperinflated with minimal central peribronchial thickening. No acute infiltrate, pleural effusion, or pneumothorax. Bones demineralized. IMPRESSION: COPD changes without acute infiltrate. Aortic atherosclerosis. Electronically Signed   By: Lavonia Dana M.D.   On: 01/24/2017 18:07   Ct Chest Wo Contrast  Result Date: 01/26/2017 CLINICAL DATA:  Patient with cough and shortness of breath. Decreased oxygen saturations. EXAM: CT CHEST WITHOUT CONTRAST TECHNIQUE: Multidetector CT imaging of the chest was performed following the standard protocol  without IV contrast. COMPARISON:  Chest radiograph 01/24/2017. FINDINGS: Cardiovascular: Heart is mildly enlarged. Dense coronary arterial vascular calcifications. No pericardial effusion. Aortic  atherosclerosis. Mediastinum/Nodes: No axillary adenopathy. Multiple prominent mildly enlarged mediastinal lymph nodes are demonstrated including a 9 mm right peritracheal lymph node (image 73; series 2) and a 14 mm subcarinal lymph node (image 86; series 2). The esophagus is normal in appearance. Lungs/Pleura: Central airways are patent. Patchy ground-glass and consolidative opacities within the left lower lobe. 11 mm calcified granuloma right lower lobe. Two adjacent 2 mm nodules right upper lobe (image 59; series 3). Centrilobular emphysematous changes. Patchy ground-glass opacities within the right middle lobe (image 119; series 3). No pleural effusion or pneumothorax. Upper Abdomen: Liver is diffusely low in attenuation. No acute process. Musculoskeletal: Thoracic spine degenerative changes. No aggressive or acute appearing osseous lesions. IMPRESSION: Subpleural ground-glass and consolidative opacities within the left lower lobe which may represent pneumonia in the appropriate clinical setting. Additionally there patchy ground-glass opacities within the right middle lobe which may be secondary to an infectious or inflammatory process. Given the multitude of findings, recommend follow-up chest CT in 3 months to assess for interval improvement/resolution. Emphysematous change. Aortic atherosclerosis. Coronary artery atherosclerosis. Mild mediastinal adenopathy, likely reactive in etiology. Recommend attention on followup. Hepatic steatosis. Electronically Signed   By: Lovey Newcomer M.D.   On: 01/26/2017 15:52     CBC  Recent Labs Lab 01/24/17 1737 01/25/17 0550  WBC 16.2* 11.8*  HGB 16.0 15.8  HCT 48.4 45.8  PLT 172 170  MCV 88.6 88.3  MCH 29.4 30.5  MCHC 33.2 34.6  RDW 15.6* 15.5*    Chemistries    Recent Labs Lab 01/24/17 1737 01/25/17 0550  NA 137 138  K 3.9 4.1  CL 104 104  CO2 25 27  GLUCOSE 144* 185*  BUN 16 19  CREATININE 1.07 1.20  CALCIUM 8.5* 8.6*  AST 36  --   ALT 49  --   ALKPHOS 94  --   BILITOT 1.7*  --    ------------------------------------------------------------------------------------------------------------------ estimated creatinine clearance is 78.7 mL/min (by C-G formula based on SCr of 1.2 mg/dL). ------------------------------------------------------------------------------------------------------------------ No results for input(s): HGBA1C in the last 72 hours. ------------------------------------------------------------------------------------------------------------------ No results for input(s): CHOL, HDL, LDLCALC, TRIG, CHOLHDL, LDLDIRECT in the last 72 hours. ------------------------------------------------------------------------------------------------------------------ No results for input(s): TSH, T4TOTAL, T3FREE, THYROIDAB in the last 72 hours.  Invalid input(s): FREET3 ------------------------------------------------------------------------------------------------------------------ No results for input(s): VITAMINB12, FOLATE, FERRITIN, TIBC, IRON, RETICCTPCT in the last 72 hours.  Coagulation profile No results for input(s): INR, PROTIME in the last 168 hours.  No results for input(s): DDIMER in the last 72 hours.  Cardiac Enzymes No results for input(s): CKMB, TROPONINI, MYOGLOBIN in the last 168 hours.  Invalid input(s): CK ------------------------------------------------------------------------------------------------------------------ Invalid input(s): [place]   This is a 76 y.o. male with a history of COPD, hypertension, peripheral vascular disease, atrial fibrillation status post cardioversion now being admitted with:  1. Acute on chronic, likely hypoxic respiratory failure secondary to influenza  A, Bilateral pneumonia, now on levofloxacin, qualify for home oxygen therapy. Continue O2, Nebulizer therapy every 4 hours, Pulmicort, Tamiflu. Sputum cultures are not reported    2. Acute on chronic COPD exacerbation -Continue duo nebs, Pulmicort, intravenous steroids -Continue tapering steroids  - Nebulizers, O2 therapy and expectorants as needed.    Some improved clinically , weaning off oxygen as tolerated   3. Essential hypertension -Continue Lopressor and Entresto, now on decreased Lopressor dose since patient's heart rate ranged in high 40s to 50s, heart rate remains slow in 50s, may need to decrease even lower  4.  History of atrial fibrillation status post cardioversion -Continue digoxin and Xarelto  5. 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 History of obstructive sleep apnea -CPAP at bedtime  7. History of peripheral vascular disease with venous stasis   8, nicotine addition smoking cessation provided, 32min spent, recommend pt stop smoking. I discussed again today, refuses to stop smoking  9. Leukocytosis, improving with therapy  10 influenza A, continue Tamiflu to complete course    Code Status Orders        Start     Ordered   01/25/17 0106  Full code  Continuous     01/25/17 0105    Code Status History    Date Active Date Inactive Code Status Order ID Comments User Context   This patient has a current code status but no historical code status.           Consults  none   DVT Prophylaxis  Lovenox    Lab Results  Component Value Date   PLT 170 01/25/2017     Time Spent in minutes   40 min Dc planning was discussed, Echo abuse issues were discussed, patient voiced understanding. However, refuses to quit smoking  Lacreshia Bondarenko M.D on 01/27/2017 at 12:46 PM  Between 7am to 6pm - Pager - [phone]  After 6pm go to  [email or web address] - password [email or web address] [facility]   Office  [phone]

## 2017-01-27 NOTE — Progress Notes (Signed)
BP 104/48, HR 51. Digoxin and metoprolol held this am. Dr Ether Griffins notified, ordered digoxin levels. If levels are normal, ok to give digoxin this am.

## 2017-01-28 DIAGNOSIS — J189 Pneumonia, unspecified organism: Secondary | ICD-10-CM

## 2017-01-28 DIAGNOSIS — J4489 Other specified chronic obstructive pulmonary disease: Secondary | ICD-10-CM

## 2017-01-28 DIAGNOSIS — J441 Chronic obstructive pulmonary disease with (acute) exacerbation: Secondary | ICD-10-CM

## 2017-01-28 DIAGNOSIS — I5022 Chronic systolic (congestive) heart failure: Secondary | ICD-10-CM

## 2017-01-28 DIAGNOSIS — J101 Influenza due to other identified influenza virus with other respiratory manifestations: Secondary | ICD-10-CM

## 2017-01-28 DIAGNOSIS — R001 Bradycardia, unspecified: Secondary | ICD-10-CM

## 2017-01-28 DIAGNOSIS — Z9119 Patient's noncompliance with other medical treatment and regimen: Secondary | ICD-10-CM

## 2017-01-28 DIAGNOSIS — Z716 Tobacco abuse counseling: Secondary | ICD-10-CM

## 2017-01-28 DIAGNOSIS — D72829 Elevated white blood cell count, unspecified: Secondary | ICD-10-CM

## 2017-01-28 DIAGNOSIS — Z91199 Patient's noncompliance with other medical treatment and regimen due to unspecified reason: Secondary | ICD-10-CM

## 2017-01-28 DIAGNOSIS — J439 Emphysema, unspecified: Secondary | ICD-10-CM

## 2017-01-28 HISTORY — DX: Pneumonia, unspecified organism: J18.9

## 2017-01-28 HISTORY — DX: Chronic obstructive pulmonary disease with (acute) exacerbation: J44.1

## 2017-01-28 MED ORDER — OSELTAMIVIR PHOSPHATE 75 MG PO CAPS: 75.0000 mg | ORAL_CAPSULE | Freq: Two times a day (BID) | ORAL | 0 refills | Status: DC

## 2017-01-28 MED ORDER — LEVOFLOXACIN 750 MG PO TABS: 750.0000 mg | ORAL_TABLET | Freq: Every day | ORAL | 0 refills | Status: DC

## 2017-01-28 MED ORDER — PREDNISONE 10 MG (21) PO TBPK: 10.0000 mg | ORAL_TABLET | Freq: Every day | ORAL | 0 refills | Status: DC

## 2017-01-28 MED ORDER — IPRATROPIUM-ALBUTEROL 0.5-2.5 (3) MG/3ML IN SOLN: 3.0000 mL | Freq: Three times a day (TID) | RESPIRATORY_TRACT | Status: DC

## 2017-01-28 MED ORDER — METOPROLOL TARTRATE 50 MG PO TABS: 50.0000 mg | ORAL_TABLET | Freq: Two times a day (BID) | ORAL | 6 refills | Status: DC

## 2017-01-28 MED ORDER — NICOTINE 21 MG/24HR TD PT24: 21.0000 mg | MEDICATED_PATCH | Freq: Every day | TRANSDERMAL | 0 refills | Status: DC

## 2017-01-28 MED ORDER — NICOTINE 21 MG/24HR TD PT24: 21.0000 mg | MEDICATED_PATCH | Freq: Every day | TRANSDERMAL | Status: DC

## 2017-01-28 NOTE — Care Management (Signed)
Spoke with attending and the plan is for patient to be discharged home today with home O2 and a nebulizer machine. Would benefit from nursing. RNCM to speak with patient to complete full assessment. Requested new qualifying O2  sats from primary nurse

## 2017-01-28 NOTE — Discharge Summary (Signed)
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 Cancer ([place])    skin  . Emphysema of lung ([place])   . Hypertension   . PVD (peripheral vascular disease) ([place])     .pro HOSPITAL COURSE:   The patient is a 76 year old Caucasian male with past medical history significant for history of ongoing tobacco abuse, COPD, emphysema, essential hypertension, peripheral vascular disease, chronic systolic CHF, who presented to the hospital with complaints of worsening productive cough of clear to yellow sputum, shortness of breath, generalized weakness and fatigue. On arrival to the hospital. Patient's oxygen saturations were 88% on room air. His initial chest x-ray revealed COPD with no acute changes. Patient was initiated on steroids, antibiotics, nebulizing therapy, and slowly improved. He underwent CT scan of the chest without contrast , revealing left lower lobe and right middle lobe groundglass opacity, concerning for pneumonia. The patient was initiated on antibiotic levofloxacin and his oxygenation improved. By the day of discharge, patient's oxygen saturations were good on room air on exertion, he did not qualify for oxygen therapy at home. He refused to  even consider oxygen therapy, he refused nebulizing therapy and nebulizing machine. He stated that he was going to smoke as much as he wants to and is not going to be compliant with our requests for medications/nebulizing therapy. We will felt that the patient is stable to be discharged home today Discussion by problem: #1. Acute on chronic respiratory failure with hypoxia due to influenza A. Bilateral pneumonia, patient is to continue steroid therapy, he was weaned off oxygen and his oxygen saturations remained stable at around 90% on room air on exertion. Patient was advised to continue nebulizing therapy, however, refused, patient is to continue Tamiflu for the next 3 days to complete course. Sputum cultures were not obtained. The patient has clinically improved #2. COPD exacerbation, patient refused on nebs as above, he is to continue tapering steroids, he is off oxygen now, continue expectorants. Improved clinically #3 influenza A, continue Tamiflu for the next 3 days to complete course #4. Pneumonia, continue levofloxacin to complete course, sputum cultures were not obtained #5. Essential hypertension, continue Lopressor and Entresto , now on decrease Lopressor dose due to bradycardia, heart rate ranging between 40s to 50s in the hospital, follow-up with primary care physician and decrease beta blocker even lower if remains bradycardic #6. Atrial fibrillation, status post cardioversion, continue digoxin and Xarelto #7. History of cardiomyopathy with chronic systolic CHF and ejection fraction of around 29 percent, continue spironolactone, torsemide, Entresto 8. Tobacco abuse counseling, discussed this patient numerous times, he refused to quit smoking #9 medical noncompliance, patient refused nebulizing therapy, nebulizing machine, advised to follow-up with primary care physician for further recommendations  DISCHARGE CONDITIONS:   Stable  CONSULTS OBTAINED:    DRUG ALLERGIES:  No Known  Allergies  DISCHARGE MEDICATIONS:   Discharge Medication List as of 01/28/2017 10:35 AM    START taking these medications   Details  levofloxacin (LEVAQUIN) 750 MG tablet Take 1 tablet (750 mg total) by mouth daily., Starting Mon 01/28/2017, Normal    nicotine (NICODERM CQ - DOSED IN MG/24 HOURS) 21 mg/24hr patch Place 1 patch (21 mg total) onto the skin daily., Starting Mon 01/28/2017, Normal    oseltamivir (TAMIFLU) 75 MG capsule Take 1 capsule (75 mg total) by mouth 2 (two) times daily., Starting Mon 01/28/2017, Normal    predniSONE (STERAPRED UNI-PAK 21 TAB) 10 MG (21) TBPK tablet Take 1 tablet (10 mg total) by mouth daily. Please take 6 pills in the morning on the day 1, then taper by one pill daily until finished, thank you, Starting Mon 01/28/2017, Normal      CONTINUE these medications which have CHANGED   Details  metoprolol (LOPRESSOR) 50 MG tablet Take 1 tablet (50 mg total) by mouth 2 (two) times daily., Starting Mon 01/28/2017, Normal      CONTINUE these medications which have NOT CHANGED   Details  digoxin (LANOXIN) 0.125 MG tablet Take 0.125 mg by mouth daily., Historical Med    Rivaroxaban (XARELTO) 15 MG TABS tablet Take 15 mg by mouth daily with supper., Historical Med    sacubitril-valsartan (ENTRESTO) 49-51 MG Take 1 tablet by mouth 2 (two) times daily., Historical Med    spironolactone (ALDACTONE) 25 MG tablet Take 25 mg by mouth daily., Historical Med    torsemide (DEMADEX) 20 MG tablet Take 20 mg by mouth daily., Historical Med         DISCHARGE INSTRUCTIONS:    The patient is to follow-up with primary care physician within one week after discharge  If you experience worsening of your admission symptoms, develop shortness of breath, life threatening emergency, suicidal or homicidal thoughts you must seek medical attention immediately by calling 911 or calling your MD immediately  if symptoms less severe.  You Must read complete instructions/literature  along with all the possible adverse reactions/side effects for all the Medicines you take and that have been prescribed to you. Take any new Medicines after you have completely understood and accept all the possible adverse reactions/side effects.   Please note  You were cared for by a hospitalist during your hospital stay. If you have any questions about your discharge medications or the care you received while you were in the hospital after you are discharged, you can call the unit and asked to speak with the hospitalist on call if the hospitalist that took care of you is not available. Once you are discharged, your primary care physician will handle any further medical issues. Please note that NO REFILLS for any discharge medications will be authorized once you are discharged, as it is imperative that you return to your primary care physician (or establish a relationship with a primary care physician if you do not have one) for your aftercare needs so that they can reassess your need for medications and monitor your lab values.    Today   CHIEF COMPLAINT:   Chief Complaint  Patient presents with  . Shortness of Breath    HISTORY OF PRESENT ILLNESS:  Clayton Dentremont  is a 76 y.o. male with a known history of ongoing tobacco abuse, COPD, emphysema, essential hypertension, peripheral vascular disease, chronic systolic CHF, who  presented to the hospital with complaints of worsening productive cough of clear to yellow sputum, shortness of breath, generalized weakness and fatigue. On arrival to the hospital. Patient's oxygen saturations were 88% on room air. His initial chest x-ray revealed COPD with no acute changes. Patient was initiated on steroids, antibiotics, nebulizing therapy, and slowly improved. He underwent CT scan of the chest without contrast , revealing left lower lobe and right middle lobe groundglass opacity, concerning for pneumonia. The patient was initiated on antibiotic levofloxacin  and his oxygenation improved. By the day of discharge, patient's oxygen saturations were good on room air on exertion, he did not qualify for oxygen therapy at home. He refused to even consider oxygen therapy, he refused nebulizing therapy and nebulizing machine. He stated that he was going to smoke as much as he wants to and is not going to be compliant with our requests for medications/nebulizing therapy. We will felt that the patient is stable to be discharged home today Discussion by problem: #1. Acute on chronic respiratory failure with hypoxia due to influenza A. Bilateral pneumonia, patient is to continue steroid therapy, he was weaned off oxygen and his oxygen saturations remained stable at around 90% on room air on exertion. Patient was advised to continue nebulizing therapy, however, refused, patient is to continue Tamiflu for the next 3 days to complete course. Sputum cultures were not obtained. The patient has clinically improved #2. COPD exacerbation, patient refused on nebs as above, he is to continue tapering steroids, he is off oxygen now, continue expectorants. Improved clinically #3 influenza A, continue Tamiflu for the next 3 days to complete course #4. Pneumonia, continue levofloxacin to complete course, sputum cultures were not obtained #5. Essential hypertension, continue Lopressor and Entresto , now on decrease Lopressor dose due to bradycardia, heart rate ranging between 40s to 50s in the hospital, follow-up with primary care physician and decrease beta blocker even lower if remains bradycardic #6. Atrial fibrillation, status post cardioversion, continue digoxin and Xarelto #7. History of cardiomyopathy with chronic systolic CHF and ejection fraction of around 29 percent, continue spironolactone, torsemide, Entresto 8. Tobacco abuse counseling, discussed this patient numerous times, he refused to quit smoking #9 medical noncompliance, patient refused nebulizing therapy, nebulizing  machine, advised to follow-up with primary care physician for further recommendations    VITAL SIGNS:  Blood pressure 128/63, pulse 88, temperature 98 F (36.7 C), resp. rate 18, height 6\' 1"  (1.854 m), weight 133.8 kg (295 lb), SpO2 91 %.  I/O:   Intake/Output Summary (Last 24 hours) at 01/28/17 1348 Last data filed at 01/28/17 0400  Gross per 24 hour  Intake              630 ml  Output                0 ml  Net              630 ml    PHYSICAL EXAMINATION:  GENERAL:  76 y.o.-year-old patient lying in the bed with no acute distress.  EYES: Pupils equal, round, reactive to light and accommodation. No scleral icterus. Extraocular muscles intact.  HEENT: Head atraumatic, normocephalic. Oropharynx and nasopharynx clear.  NECK:  Supple, no jugular venous distention. No thyroid enlargement, no tenderness.  LUNGS: Normal breath sounds bilaterally, no wheezing, rales,rhonchi or crepitation. No use of accessory muscles of respiration.  CARDIOVASCULAR: S1, S2 normal. No murmurs, rubs, or gallops.  ABDOMEN: Soft, non-tender, non-distended. Bowel sounds present. No organomegaly or mass.  EXTREMITIES: No pedal edema, cyanosis, or clubbing.  NEUROLOGIC: Cranial nerves II through XII are intact. Muscle strength 5/5 in all extremities. Sensation intact. Gait not checked.  PSYCHIATRIC: The patient is alert and oriented x 3.  SKIN: No obvious rash, lesion, or ulcer.   DATA REVIEW:   CBC  Recent Labs Lab 01/25/17 0550  WBC 11.8*  HGB 15.8  HCT 45.8  PLT 170    Chemistries   Recent Labs Lab 01/24/17 1737 01/25/17 0550  NA 137 138  K 3.9 4.1  CL 104 104  CO2 25 27  GLUCOSE 144* 185*  BUN 16 19  CREATININE 1.07 1.20  CALCIUM 8.5* 8.6*  AST 36  --   ALT 49  --   ALKPHOS 94  --   BILITOT 1.7*  --     Cardiac Enzymes No results for input(s): TROPONINI in the last 168 hours.  Microbiology Results  No results found for this or any previous visit.  RADIOLOGY:  Ct Chest Wo  Contrast  Result Date: 01/26/2017 CLINICAL DATA:  Patient with cough and shortness of breath. Decreased oxygen saturations. EXAM: CT CHEST WITHOUT CONTRAST TECHNIQUE: Multidetector CT imaging of the chest was performed following the standard protocol without IV contrast. COMPARISON:  Chest radiograph 01/24/2017. FINDINGS: Cardiovascular: Heart is mildly enlarged. Dense coronary arterial vascular calcifications. No pericardial effusion. Aortic atherosclerosis. Mediastinum/Nodes: No axillary adenopathy. Multiple prominent mildly enlarged mediastinal lymph nodes are demonstrated including a 9 mm right peritracheal lymph node (image 73; series 2) and a 14 mm subcarinal lymph node (image 86; series 2). The esophagus is normal in appearance. Lungs/Pleura: Central airways are patent. Patchy ground-glass and consolidative opacities within the left lower lobe. 11 mm calcified granuloma right lower lobe. Two adjacent 2 mm nodules right upper lobe (image 59; series 3). Centrilobular emphysematous changes. Patchy ground-glass opacities within the right middle lobe (image 119; series 3). No pleural effusion or pneumothorax. Upper Abdomen: Liver is diffusely low in attenuation. No acute process. Musculoskeletal: Thoracic spine degenerative changes. No aggressive or acute appearing osseous lesions. IMPRESSION: Subpleural ground-glass and consolidative opacities within the left lower lobe which may represent pneumonia in the appropriate clinical setting. Additionally there patchy ground-glass opacities within the right middle lobe which may be secondary to an infectious or inflammatory process. Given the multitude of findings, recommend follow-up chest CT in 3 months to assess for interval improvement/resolution. Emphysematous change. Aortic atherosclerosis. Coronary artery atherosclerosis. Mild mediastinal adenopathy, likely reactive in etiology. Recommend attention on followup. Hepatic steatosis. Electronically Signed   By:  Lovey Newcomer M.D.   On: 01/26/2017 15:52    EKG:   Orders placed or performed during the hospital encounter of 01/24/17  . ED EKG  . ED EKG  . EKG 12-Lead  . EKG      Management plans discussed with the patient, family and they are in agreement.  CODE STATUS:     Code Status Orders        Start     Ordered   01/25/17 0106  Full code  Continuous     01/25/17 0105    Code Status History    Date Active Date Inactive Code Status Order ID Comments User Context   This patient has a current code status but no historical code status.      TOTAL TIME TAKING CARE OF THIS PATIENT: 40 minutes.    Theodoro Grist M.D on 01/28/2017 at 1:48 PM  Between 7am to 6pm - Pager - [phone]  After  6pm go to [email or web address] - password [email or web address] [facility]  Office  [phone]  CC: Primary care physician; Marden Noble, MD

## 2017-01-28 NOTE — Progress Notes (Signed)
Patient is alert and oriented. No complaints of pain at this time. Discharge instructions given to patient. AVS and hard scripted reviewed with patient and patient verbalized understanding of all discharge instructions. Vital signs stable. IV discontinued. Patient called family and awaiting transport from them to home.

## 2017-01-28 NOTE — Progress Notes (Signed)
Patient is on room air. O2 sats remaining 95%. No complaints of shortness of breath. States he is "breathing better and does not need oxygen."

## 2017-01-28 NOTE — Care Management Note (Signed)
Case Management Note  Patient Details  Name: Stanley Marks MRN: [id number] Date of Birth: August 16, 1941  Subjective/Objective:  Met with patient at bedside. Primary nurse completed qualifying sats while CM in room. Patient did not qualify for home O2. He also refused any home O2 or a nebulizer machine. He states all he needs are his Marlboro cigarettes. He refused a nurse stating " I dont like any one in my house". " I just want to go home by 11am." Updated attending.                  Action/Plan: Home with self care after patient refused any services or DME.   Expected Discharge Date:    2/19/218              Expected Discharge Plan:  Home/Self Care  In-House Referral:     Discharge planning Services  CM Consult  Post Acute Care Choice:    Choice offered to:  Patient  DME Arranged:    DME Agency:     HH Arranged:    [place] Agency:     Status of Service:  Completed, signed off  If discussed at [employer] of Stay Meetings, dates discussed:    Additional Comments:  Jolly Mango, RN 01/28/2017, 9:57 AM

## 2017-01-28 NOTE — Progress Notes (Signed)
SATURATION QUALIFICATIONS: (This note is used to comply with regulatory documentation for home oxygen)  Patient Saturations on Room Air at Rest = 94%  Patient Saturations on Room Air while Ambulating = 92%  

## 2017-01-28 NOTE — Care Management Important Message (Signed)
Important Message  Patient Details  Name: Stanley Marks MRN: [id number] Date of Birth: [birth year]   Medicare Important Message Given:  Yes Copy of initial IM given   Jolly Mango, RN 01/28/2017, 11:05 AM

## 2018-10-29 ENCOUNTER — Ambulatory Visit: Payer: Medicare Other | Admitting: Podiatry

## 2019-02-09 ENCOUNTER — Encounter: Payer: Self-pay | Admitting: Podiatry

## 2019-02-09 ENCOUNTER — Ambulatory Visit (INDEPENDENT_AMBULATORY_CARE_PROVIDER_SITE_OTHER): Payer: Medicare Other | Admitting: Podiatry

## 2019-02-09 DIAGNOSIS — Q828 Other specified congenital malformations of skin: Secondary | ICD-10-CM

## 2019-02-09 DIAGNOSIS — B351 Tinea unguium: Secondary | ICD-10-CM | POA: Diagnosis not present

## 2019-02-09 DIAGNOSIS — M79676 Pain in unspecified toe(s): Secondary | ICD-10-CM | POA: Diagnosis not present

## 2019-02-10 NOTE — Progress Notes (Signed)
He presents today chief complaint of painful porokeratotic lesion plantar aspect of the right foot.  And elongated painful nails.  Objective: Vital signs are stable is alert and oriented x3 pulses are palpable.  Toenails are grossly elongated thickened dystrophic painful and mycotic.  One single reactive hyperkeratotic lesion sub-fourth metatarsal head of the right foot.  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  Plan: Debridement of painful porokeratotic lesion and then placed salicylic acid under occlusion to be washed off in 3 days.  He understands this is amenable to it.  Also went ahead and debrided all reactive hyperkeratotic tissue as well as nails.

## 2019-03-05 ENCOUNTER — Emergency Department
Admission: EM | Admit: 2019-03-05 | Discharge: 2019-03-05 | Discharge status: Against Medical Advice | Payer: Medicare Other | Attending: Emergency Medicine | Admitting: Emergency Medicine

## 2019-03-05 ENCOUNTER — Emergency Department: Payer: Medicare Other

## 2019-03-05 ENCOUNTER — Encounter: Payer: Self-pay | Admitting: Emergency Medicine

## 2019-03-05 ENCOUNTER — Other Ambulatory Visit: Payer: Self-pay

## 2019-03-05 DIAGNOSIS — J449 Chronic obstructive pulmonary disease, unspecified: Secondary | ICD-10-CM | POA: Insufficient documentation

## 2019-03-05 DIAGNOSIS — R072 Precordial pain: Secondary | ICD-10-CM | POA: Diagnosis not present

## 2019-03-05 DIAGNOSIS — I4891 Unspecified atrial fibrillation: Secondary | ICD-10-CM

## 2019-03-05 DIAGNOSIS — Z79899 Other long term (current) drug therapy: Secondary | ICD-10-CM | POA: Diagnosis not present

## 2019-03-05 DIAGNOSIS — Z5320 Procedure and treatment not carried out because of patient's decision for unspecified reasons: Secondary | ICD-10-CM | POA: Insufficient documentation

## 2019-03-05 DIAGNOSIS — I110 Hypertensive heart disease with heart failure: Secondary | ICD-10-CM | POA: Insufficient documentation

## 2019-03-05 DIAGNOSIS — R079 Chest pain, unspecified: Secondary | ICD-10-CM | POA: Diagnosis present

## 2019-03-05 DIAGNOSIS — Z7901 Long term (current) use of anticoagulants: Secondary | ICD-10-CM | POA: Insufficient documentation

## 2019-03-05 DIAGNOSIS — I5022 Chronic systolic (congestive) heart failure: Secondary | ICD-10-CM | POA: Insufficient documentation

## 2019-03-05 DIAGNOSIS — F17210 Nicotine dependence, cigarettes, uncomplicated: Secondary | ICD-10-CM | POA: Insufficient documentation

## 2019-03-05 DIAGNOSIS — I480 Paroxysmal atrial fibrillation: Secondary | ICD-10-CM | POA: Diagnosis not present

## 2019-03-05 LAB — BASIC METABOLIC PANEL
Anion gap: 8 (ref 5–15)
BUN: 12 mg/dL (ref 8–23)
CALCIUM: 8.7 mg/dL — AB (ref 8.9–10.3)
CO2: 25 mmol/L (ref 22–32)
CREATININE: 1.19 mg/dL (ref 0.61–1.24)
Chloride: 103 mmol/L (ref 98–111)
GFR calc Af Amer: >60 mL/min (ref >60)
GFR, EST NON AFRICAN AMERICAN: 59 mL/min — AB (ref >60)
Glucose, Bld: 143 mg/dL — ABNORMAL HIGH (ref 70–99)
Potassium: 3.7 mmol/L (ref 3.5–5.1)
SODIUM: 136 mmol/L (ref 135–145)

## 2019-03-05 LAB — CBC
HCT: 48.8 % (ref 39.0–52.0)
HEMOGLOBIN: 15.5 g/dL (ref 13.0–17.0)
MCH: 27.9 pg (ref 26.0–34.0)
MCHC: 31.8 g/dL (ref 30.0–36.0)
MCV: 87.8 fL (ref 80.0–100.0)
PLATELETS: 220 10*3/uL (ref 150–400)
RBC: 5.56 MIL/uL (ref 4.22–5.81)
RDW: 15.9 % — AB (ref 11.5–15.5)
WBC: 12.6 10*3/uL — AB (ref 4.0–10.5)
nRBC: 0 % (ref 0.0–0.2)

## 2019-03-05 LAB — TROPONIN I

## 2019-03-05 LAB — MAGNESIUM: Magnesium: 2 mg/dL (ref 1.7–2.4)

## 2019-03-05 MED ORDER — SODIUM CHLORIDE 0.9 % IV BOLUS
500.0000 mL | Freq: Once | INTRAVENOUS | Status: AC
  Administered 2019-03-05: 500 mL via INTRAVENOUS

## 2019-03-05 MED ORDER — SODIUM CHLORIDE 0.9 % IV BOLUS
1000.0000 mL | Freq: Once | INTRAVENOUS | Status: DC
Start: 2019-03-05 — End: 2019-03-05

## 2019-03-05 MED ORDER — IOPAMIDOL (ISOVUE-370) INJECTION 76%
100.0000 mL | Freq: Once | INTRAVENOUS | Status: AC | PRN
  Administered 2019-03-05: 100 mL via INTRAVENOUS

## 2019-03-05 MED ORDER — NITROGLYCERIN 0.4 MG SL SUBL
0.4000 mg | SUBLINGUAL_TABLET | SUBLINGUAL | Status: DC | PRN
  Administered 2019-03-05: 0.4 mg via SUBLINGUAL
  Filled 2019-03-05: qty 1

## 2019-03-05 MED ORDER — DILTIAZEM HCL 25 MG/5ML IV SOLN
20.0000 mg | Freq: Once | INTRAVENOUS | Status: AC
  Administered 2019-03-05: 20 mg via INTRAVENOUS
  Filled 2019-03-05: qty 5

## 2019-03-05 NOTE — ED Provider Notes (Signed)
[facility] Emergency Department Provider Note  ____________________________________________  Time seen: Approximately 7:00 PM  I have reviewed the triage vital signs and the nursing notes.   HISTORY  Chief Complaint Chest Pain    HPI Stanley Marks is a 78 y.o. male with a history of emphysema hypertension PVD  and atrial fibrillation who comes the ED today complaining of right side chest pain, started last night, continuous, no aggravating or alleviating factors, feels sharp and heavy.  Nonradiating.  Not pleuritic, not exertional.  No shortness of breath cough vomiting or diaphoresis.  Reports compliance with his medications.  Cardiologist is [employer]. Reports pain is currently about 7/10.    Past Medical History:  Diagnosis Date  . Cancer ([place])    skin  . Emphysema of lung ([place])   . Hypertension   . PVD (peripheral vascular disease) [facility])      Patient Active Problem List   Diagnosis Date Noted  . Influenza A 01/28/2017  . Pneumonia 01/28/2017  . COPD exacerbation ([place]) 01/28/2017  . Tobacco abuse counseling 01/28/2017  . Patient's noncompliance with other medical treatment and regimen 01/28/2017  . Emphysema of lung ([place]) 01/28/2017  . Leukocytosis 01/28/2017  . Chronic systolic CHF (congestive heart failure) ([place]) 01/28/2017  . Bradycardia 01/28/2017  . Acute and chronic respiratory failure with hypoxia ([place]) 01/26/2017  . Obstructive sleep apnea syndrome 03/01/2016  . Personal history of other malignant neoplasm of skin 12/08/2014     Past Surgical History:  Procedure Laterality Date  . APPENDECTOMY    . arm surgery    . ELECTROPHYSIOLOGIC STUDY N/A 11/20/2016   Procedure: CARDIOVERSION;  Surgeon: Dionisio David, MD;  Location: [facility] ORS;  Service: Cardiovascular;  Laterality: N/A;     Prior to Admission medications   Medication Sig Start Date End Date Taking? Authorizing Provider  sacubitril-valsartan (ENTRESTO) 49-51 MG  Take 1 tablet by mouth 2 (two) times daily.   Yes [provider]  torsemide (DEMADEX) 20 MG tablet Take 20 mg by mouth daily.   Yes [provider]  budesonide-formoterol (SYMBICORT) 160-4.5 MCG/ACT inhaler Inhale 1 puff into the lungs.     [provider]  digoxin (LANOXIN) 0.125 MG tablet Take 0.125 mg by mouth daily.    [provider]  metolazone (ZAROXOLYN) 5 MG tablet Take by mouth.    [provider]  Rivaroxaban (XARELTO) 15 MG TABS tablet Take 15 mg by mouth daily with supper.    [provider]  spironolactone (ALDACTONE) 25 MG tablet Take 25 mg by mouth daily.    [provider]  TOPROL XL 25 MG 24 hr tablet  12/19/18   [provider]     Allergies Lipitor [atorvastatin calcium]   No family history on file.  Social History Social History   Tobacco Use  . Smoking status: Current Every Day Smoker    Packs/day: 0.00  . Smokeless tobacco: Never Used  Substance Use Topics  . Alcohol use: No  . Drug use: Not on file    Review of Systems  Constitutional:   No fever or chills.  ENT:   No sore throat. No rhinorrhea. Cardiovascular: Positive as above chest pain without syncope. Respiratory:   No dyspnea or cough. Gastrointestinal:   Negative for abdominal pain, vomiting and diarrhea.  Musculoskeletal:   Negative for focal pain or swelling All other systems reviewed and are negative except as documented above in ROS and HPI.  ____________________________________________   PHYSICAL EXAM:  VITAL SIGNS: ED Triage Vitals [03/05/19 1651]  Enc Vitals Group     BP 112/72     Pulse Rate 85     Resp 18     Temp 98 F (36.7 C)     Temp Source Oral     SpO2 92 %     Weight 295 lb (133.8 kg)     Height 6\' 2"  (1.88 m)     Head Circumference      Peak Flow      Pain Score 7     Pain Loc      Pain Edu?      Excl. in [place]?     Vital signs reviewed, nursing assessments reviewed.   Constitutional:    Alert and oriented.  Ill-appearing. Eyes:   Conjunctivae are normal. EOMI. PERRL. ENT      Head:   Normocephalic and atraumatic.      Nose:   No congestion/rhinnorhea.       Mouth/Throat:   Dry mucous membranes, no pharyngeal erythema. No peritonsillar mass.       Neck:   No meningismus. Full ROM. Hematological/Lymphatic/Immunilogical:   No cervical lymphadenopathy. Cardiovascular:   Irregular rhythm, tachycardia heart rate 1 30-1 50. Symmetric bilateral radial and DP pulses.  No murmurs. Cap refill less than 2 seconds. Respiratory:   Normal respiratory effort without tachypnea/retractions. Breath sounds are clear and equal bilaterally. No wheezes/rales/rhonchi. Gastrointestinal:   Soft with generalized abdominal pain, nonfocal. Non distended. There is no CVA tenderness.  No rebound, rigidity, or guarding.  No hernias Musculoskeletal:   Normal range of motion in all extremities. No joint effusions.  No lower extremity tenderness.  No edema. Neurologic:   Normal speech and language.  Motor grossly intact. No acute focal neurologic deficits are appreciated.  Skin:    Skin is warm, dry and intact. No rash noted.  No petechiae, purpura, or bullae.  ____________________________________________    LABS (pertinent positives/negatives) (all labs ordered are listed, but only abnormal results are displayed) Labs Reviewed  BASIC METABOLIC PANEL - Abnormal; Notable for the following components:      Result Value   Glucose, Bld 143 (*)    Calcium 8.7 (*)    GFR calc non Af Amer 59 (*)    All other components within normal limits  CBC - Abnormal; Notable for the following components:   WBC 12.6 (*)    RDW 15.9 (*)    All other components within normal limits  TROPONIN I  MAGNESIUM   ____________________________________________   EKG  Interpreted by me Atrial fibrillation with rapid ventricular response, rate of 150.  Normal axis and intervals.  Normal QRS ST segments and T  waves.  ____________________________________________    [id number]  Dg Chest Portable 1 View  Result Date: 03/05/2019 CLINICAL DATA:  Chest pain EXAM: PORTABLE CHEST 1 VIEW COMPARISON:  01/24/2017 FINDINGS: Cardiac shadow is mildly prominent but accentuated by the portable technique. Aortic calcifications are again seen. The lungs are well aerated bilaterally. Mild bronchitic changes are noted without focal confluent infiltrate. No effusion is seen. No bony abnormality is noted. IMPRESSION: Changes of bronchitis. Electronically Signed   By: Inez Catalina M.D.   On: 03/05/2019 18:35    ____________________________________________   PROCEDURES .Critical Care Performed by: Carrie Mew, MD Authorized by: Carrie Mew, MD   Critical care provider statement:    Critical care time (minutes):  35   Critical care time was exclusive of:  Separately billable procedures and  treating other patients   Critical care was necessary to treat or prevent imminent or life-threatening deterioration of the following conditions:  Cardiac failure   Critical care was time spent personally by me on the following activities:  Development of treatment plan with patient or surrogate, discussions with consultants, evaluation of patient's response to treatment, examination of patient, obtaining history from patient or surrogate, ordering and performing treatments and interventions, ordering and review of laboratory studies, ordering and review of radiographic studies, pulse oximetry, re-evaluation of patient's condition and review of old charts    ____________________________________________  DIFFERENTIAL DIAGNOSIS   A. fib with RVR, electrolyte disturbance, dehydration, non-STEMI, intra-abdominal pathology (such as bowel obstruction biliary disease pancreatitis or enteritis),  pneumonia  CLINICAL IMPRESSION / ASSESSMENT AND PLAN / ED COURSE  Medications ordered in the ED: Medications  nitroGLYCERIN  (NITROSTAT) SL tablet 0.4 mg (0.4 mg Sublingual Given 03/05/19 1847)  sodium chloride 0.9 % bolus 500 mL (500 mLs Intravenous New Bag/Given 03/05/19 1850)    Pertinent labs & imaging results that were available during my care of the patient were reviewed by me and considered in my medical decision making (see chart for details).    Patient presents with chest pain and A. fib with rapid ventricular response.  Will give gentle IV fluids with a 500 mL bolus for now as a trial to see if this improves his heart rate, nitroglycerin for his chest pain.  EKG shows A. fib with RVR without ischemic changes, but I am worried about non-STEMI or rate related demand ischemia.  Doubt PE given his Xarelto use.  Labs are unremarkable, troponin negative, electrolytes normal.  I will obtain CT scans of chest abdomen pelvis for further evaluation given his severe comorbidities, chest pain, and abdominal tenderness.  ----------------------------------------- 9:18 PM on 03/05/2019 -----------------------------------------  CT scan unremarkable.  Patient reports the pain is better, heart rate is improved to about 1 10-1 20.  Patient sitting upright on the side of the bed and wants to go home.  I advised him that I am worried that his heart rate is not yet adequately controlled, and his chest pain is concerning that he may have a heart attack which could cause heart failure or death.  However he insists that he will go home, does not want to be hospitalized tonight, will follow up with his cardiologist tomorrow.  He has medical decision-making capacity, not depressed or suicidal or delusional or hallucinating.  He understands the risks and verbalizes a willingness to accept that risk and had a strong preference for being at home and avoiding hospitalization at this time.  Advised him he is always welcome to return to the ED if he changes his mind or has new concerns.  Recommended he call 911 if he decides to come back.      ____________________________________________   FINAL CLINICAL IMPRESSION(S) / ED DIAGNOSES    Final diagnoses:  Atrial fibrillation with rapid ventricular response (HCC)  Precordial pain     ED Discharge Orders    None      Portions of this note were generated with dragon dictation software. Dictation errors may occur despite best attempts at proofreading.   Carrie Mew, MD 03/05/19 2119

## 2019-03-05 NOTE — Discharge Instructions (Signed)
Return to the ER if you have new concerns, worsening chest pain, or change your mind.  We are worried that your chest pain and racing heart rate may cause heart failure or a heart attack which could be life-threatening.

## 2019-03-05 NOTE — ED Notes (Signed)
Pt placed on Zoll monitor.

## 2019-03-05 NOTE — ED Triage Notes (Signed)
Pt arrives with complaints of sharp upper chest pain that started last night. Pt states the pain feels sharp.

## 2019-03-26 ENCOUNTER — Ambulatory Visit: Payer: Medicare Other | Admitting: Podiatry

## 2019-04-01 ENCOUNTER — Ambulatory Visit: Payer: Medicare Other | Admitting: Podiatry

## 2019-05-11 DIAGNOSIS — I2510 Atherosclerotic heart disease of native coronary artery without angina pectoris: Secondary | ICD-10-CM

## 2019-05-11 DIAGNOSIS — N183 Chronic kidney disease, stage 3 unspecified: Secondary | ICD-10-CM | POA: Insufficient documentation

## 2019-05-11 DIAGNOSIS — Z7901 Long term (current) use of anticoagulants: Secondary | ICD-10-CM | POA: Insufficient documentation

## 2019-05-11 DIAGNOSIS — I509 Heart failure, unspecified: Secondary | ICD-10-CM | POA: Insufficient documentation

## 2019-05-11 DIAGNOSIS — N189 Chronic kidney disease, unspecified: Secondary | ICD-10-CM | POA: Insufficient documentation

## 2019-05-11 DIAGNOSIS — E782 Mixed hyperlipidemia: Secondary | ICD-10-CM | POA: Insufficient documentation

## 2019-05-11 HISTORY — DX: Atherosclerotic heart disease of native coronary artery without angina pectoris: I25.10

## 2019-10-05 ENCOUNTER — Other Ambulatory Visit: Payer: Self-pay

## 2019-10-05 ENCOUNTER — Emergency Department: Payer: Medicare Other

## 2019-10-05 ENCOUNTER — Emergency Department
Admission: EM | Admit: 2019-10-05 | Discharge: 2019-10-05 | Disposition: A | Discharge status: Home / Self Care | Payer: Medicare Other | Attending: Emergency Medicine | Admitting: Emergency Medicine

## 2019-10-05 DIAGNOSIS — Z85828 Personal history of other malignant neoplasm of skin: Secondary | ICD-10-CM | POA: Insufficient documentation

## 2019-10-05 DIAGNOSIS — Y999 Unspecified external cause status: Secondary | ICD-10-CM | POA: Diagnosis not present

## 2019-10-05 DIAGNOSIS — W19XXXA Unspecified fall, initial encounter: Secondary | ICD-10-CM | POA: Insufficient documentation

## 2019-10-05 DIAGNOSIS — F17210 Nicotine dependence, cigarettes, uncomplicated: Secondary | ICD-10-CM | POA: Diagnosis not present

## 2019-10-05 DIAGNOSIS — I110 Hypertensive heart disease with heart failure: Secondary | ICD-10-CM | POA: Insufficient documentation

## 2019-10-05 DIAGNOSIS — S81811A Laceration without foreign body, right lower leg, initial encounter: Secondary | ICD-10-CM | POA: Insufficient documentation

## 2019-10-05 DIAGNOSIS — S81801A Unspecified open wound, right lower leg, initial encounter: Secondary | ICD-10-CM

## 2019-10-05 DIAGNOSIS — I5022 Chronic systolic (congestive) heart failure: Secondary | ICD-10-CM | POA: Insufficient documentation

## 2019-10-05 DIAGNOSIS — Z7901 Long term (current) use of anticoagulants: Secondary | ICD-10-CM | POA: Diagnosis not present

## 2019-10-05 DIAGNOSIS — Y92019 Unspecified place in single-family (private) house as the place of occurrence of the external cause: Secondary | ICD-10-CM | POA: Insufficient documentation

## 2019-10-05 DIAGNOSIS — J449 Chronic obstructive pulmonary disease, unspecified: Secondary | ICD-10-CM | POA: Insufficient documentation

## 2019-10-05 DIAGNOSIS — Z79899 Other long term (current) drug therapy: Secondary | ICD-10-CM | POA: Diagnosis not present

## 2019-10-05 DIAGNOSIS — Y9301 Activity, walking, marching and hiking: Secondary | ICD-10-CM | POA: Insufficient documentation

## 2019-10-05 LAB — MAGNESIUM: Magnesium: 2.7 mg/dL — ABNORMAL HIGH (ref 1.7–2.4)

## 2019-10-05 LAB — CBC WITH DIFFERENTIAL/PLATELET
Abs Immature Granulocytes: 0.04 10*3/uL (ref 0.00–0.07)
Basophils Absolute: 0.1 10*3/uL (ref 0.0–0.1)
Basophils Relative: 1 %
Eosinophils Absolute: 0.2 10*3/uL (ref 0.0–0.5)
Eosinophils Relative: 2 %
HCT: 55.3 % — ABNORMAL HIGH (ref 39.0–52.0)
Hemoglobin: 17.5 g/dL — ABNORMAL HIGH (ref 13.0–17.0)
Immature Granulocytes: 0 %
Lymphocytes Relative: 13 %
Lymphs Abs: 1.2 10*3/uL (ref 0.7–4.0)
MCH: 30 pg (ref 26.0–34.0)
MCHC: 31.6 g/dL (ref 30.0–36.0)
MCV: 94.7 fL (ref 80.0–100.0)
Monocytes Absolute: 0.6 10*3/uL (ref 0.1–1.0)
Monocytes Relative: 6 %
Neutro Abs: 7.3 10*3/uL (ref 1.7–7.7)
Neutrophils Relative %: 78 %
Platelets: 215 10*3/uL (ref 150–400)
RBC: 5.84 MIL/uL — ABNORMAL HIGH (ref 4.22–5.81)
RDW: 16.6 % — ABNORMAL HIGH (ref 11.5–15.5)
WBC: 9.3 10*3/uL (ref 4.0–10.5)
nRBC: 0 % (ref 0.0–0.2)

## 2019-10-05 LAB — COMPREHENSIVE METABOLIC PANEL
ALT: 14 U/L (ref 0–44)
AST: 17 U/L (ref 15–41)
Albumin: 3.7 g/dL (ref 3.5–5.0)
Alkaline Phosphatase: 70 U/L (ref 38–126)
Anion gap: 9 (ref 5–15)
BUN: 24 mg/dL — ABNORMAL HIGH (ref 8–23)
CO2: 28 mmol/L (ref 22–32)
Calcium: 8.8 mg/dL — ABNORMAL LOW (ref 8.9–10.3)
Chloride: 104 mmol/L (ref 98–111)
Creatinine, Ser: 1.48 mg/dL — ABNORMAL HIGH (ref 0.61–1.24)
GFR calc Af Amer: 53 mL/min — ABNORMAL LOW (ref >60)
GFR calc non Af Amer: 45 mL/min — ABNORMAL LOW (ref >60)
Glucose, Bld: 124 mg/dL — ABNORMAL HIGH (ref 70–99)
Potassium: 4.4 mmol/L (ref 3.5–5.1)
Sodium: 141 mmol/L (ref 135–145)
Total Bilirubin: 0.9 mg/dL (ref 0.3–1.2)
Total Protein: 7.2 g/dL (ref 6.5–8.1)

## 2019-10-05 MED ORDER — DOUBLE ANTIBIOTIC 500-10000 UNIT/GM EX OINT
TOPICAL_OINTMENT | Freq: Two times a day (BID) | CUTANEOUS | Status: DC
  Administered 2019-10-05: 17:00:00 via TOPICAL
  Filled 2019-10-05: qty 28.4

## 2019-10-05 NOTE — ED Triage Notes (Signed)
Pt to ED from home c/o fall this past Friday or Saturday injuring right lower leg. States has hx swelling to legs and gets blisters, falling today opened up one on right leg, dressed at home.  Pt takes BP medicine at home and had this morning.

## 2019-10-05 NOTE — ED Notes (Signed)
Patient is outside in wheelchair with family.

## 2019-10-05 NOTE — ED Triage Notes (Signed)
[place] about a week ago.  Wound to left lower leg

## 2019-10-05 NOTE — ED Provider Notes (Signed)
[facility] Emergency Department Provider Note   ____________________________________________   I have reviewed the triage vital signs and the nursing notes.   HISTORY  Chief Complaint Fall, Leg Injury, and Wound Check   History limited by: Not Limited   HPI Stanley Marks is a 78 y.o. male who presents to the emergency department today because of concerns for a wound to his right shin.  The patient states that he fell either 2 or 3 days ago.  He states that he does fall occasionally when he gets weak.  He states he does have pain to that leg.  He denies any fevers, nausea or vomiting.   Records reviewed. Per medical record review patient has a history of hypertension,, peripheral vascular disease.  Past Medical History:  Diagnosis Date  . Cancer ([place])    skin  . Emphysema of lung ([place])   . Hypertension   . PVD (peripheral vascular disease) [facility])     Patient Active Problem List   Diagnosis Date Noted  . Influenza A 01/28/2017  . Pneumonia 01/28/2017  . COPD exacerbation ([place]) 01/28/2017  . Tobacco abuse counseling 01/28/2017  . Patient's noncompliance with other medical treatment and regimen 01/28/2017  . Emphysema of lung ([place]) 01/28/2017  . Leukocytosis 01/28/2017  . Chronic systolic CHF (congestive heart failure) ([place]) 01/28/2017  . Bradycardia 01/28/2017  . Acute and chronic respiratory failure with hypoxia ([place]) 01/26/2017  . Obstructive sleep apnea syndrome 03/01/2016  . Personal history of other malignant neoplasm of skin 12/08/2014    Past Surgical History:  Procedure Laterality Date  . APPENDECTOMY    . arm surgery    . ELECTROPHYSIOLOGIC STUDY N/A 11/20/2016   Procedure: CARDIOVERSION;  Surgeon: Dionisio David, MD;  Location: [facility] ORS;  Service: Cardiovascular;  Laterality: N/A;    Prior to Admission medications   Medication Sig Start Date End Date Taking? Authorizing Provider  budesonide-formoterol (SYMBICORT) 160-4.5 MCG/ACT  inhaler Inhale 1 puff into the lungs.     [provider]  digoxin (LANOXIN) 0.125 MG tablet Take 0.125 mg by mouth daily.    [provider]  metolazone (ZAROXOLYN) 5 MG tablet Take by mouth.    [provider]  Rivaroxaban (XARELTO) 15 MG TABS tablet Take 15 mg by mouth daily with supper.    [provider]  sacubitril-valsartan (ENTRESTO) 49-51 MG Take 1 tablet by mouth 2 (two) times daily.    [provider]  spironolactone (ALDACTONE) 25 MG tablet Take 25 mg by mouth daily.    [provider]  TOPROL XL 25 MG 24 hr tablet  12/19/18   [provider]  torsemide (DEMADEX) 20 MG tablet Take 20 mg by mouth daily.    [provider]    Allergies Lipitor [atorvastatin calcium]  No family history on file.  Social History Social History   Tobacco Use  . Smoking status: Current Every Day Smoker    Packs/day: 0.00  . Smokeless tobacco: Never Used  Substance Use Topics  . Alcohol use: No  . Drug use: Not on file    Review of Systems Constitutional: No fever/chills Eyes: No visual changes. ENT: No sore throat. Cardiovascular: Denies chest pain. Respiratory: Denies shortness of breath. Gastrointestinal: No abdominal pain.  No nausea, no vomiting.  No diarrhea.   Genitourinary: Negative for dysuria. Musculoskeletal: Positive for right leg pain.  Skin: Positive for wound to right leg.  Neurological: Negative for headaches, focal weakness or numbness.  ____________________________________________  PHYSICAL EXAM:  VITAL SIGNS: ED Triage Vitals  Enc Vitals Group     BP 10/05/19 1332 91/61     Pulse Rate 10/05/19 1332 75     Resp 10/05/19 1332 18     Temp 10/05/19 1332 97.8 F (36.6 C)     Temp Source 10/05/19 1332 Oral     SpO2 10/05/19 1332 96 %     Weight 10/05/19 1333 300 lb (136.1 kg)     Height 10/05/19 1333 6\' 2"  (1.88 m)     Head Circumference --      Peak Flow --      Pain Score 10/05/19 1333  7   Constitutional: Alert and oriented.  Eyes: Conjunctivae are normal.  ENT      Head: Normocephalic and atraumatic.      Nose: No congestion/rhinnorhea.      Mouth/Throat: Mucous membranes are moist.      Neck: No stridor. Hematological/Lymphatic/Immunilogical: No cervical lymphadenopathy. Cardiovascular: Normal rate, regular rhythm.  No murmurs, rubs, or gallops.  Respiratory: Normal respiratory effort without tachypnea nor retractions. Breath sounds are clear and equal bilaterally. No wheezes/rales/rhonchi. Gastrointestinal: Soft and non tender. No rebound. No guarding.  Genitourinary: Deferred Musculoskeletal: Bilateral lower extremity edema.  Neurologic:  Normal speech and language. No gross focal neurologic deficits are appreciated.  Skin:  4 cm diameter wound to right shin. No erythema appreciated, chronic skin changes noted.  Psychiatric: Mood and affect are normal. Speech and behavior are normal. Patient exhibits appropriate insight and judgment.  ____________________________________________    LABS (pertinent positives/negatives)  Magnesium 2.7 CBC wbc 9.3, hgb 17.5, plt 215 CMP na 141, k 4.4, glu 124, bun 24, cr 1.48  ____________________________________________   EKG  None  ____________________________________________    RADIOLOGY  Right tib/fib No acute osseous abnormality  ____________________________________________   PROCEDURES  Procedures  ____________________________________________   INITIAL IMPRESSION / ASSESSMENT AND PLAN / ED COURSE  Pertinent labs & imaging results that were available during my care of the patient were reviewed by me and considered in my medical decision making (see chart for details).   Patient presented to the emergency department today because of concern for wound to his right leg. On exam no evidence of associated infection. No leukocytosis or fever. Will plan on having nurse dress it and will give patient wound  care center follow up. Patients creatinine was elevated. Did discuss this with the patient. Do wonder if he has been over diuresed. Discussed this with the patient. Did offer to give some iv fluids. Patient however declined. Discussed importance of having primary care recheck blood work.   ____________________________________________   FINAL CLINICAL IMPRESSION(S) / ED DIAGNOSES  Final diagnoses:  Wound of right lower extremity, initial encounter     Note: This dictation was prepared with Dragon dictation. Any transcriptional errors that result from this process are unintentional     Nance Pear, MD 10/05/19 [phone]

## 2019-10-05 NOTE — Discharge Instructions (Addendum)
Please seek medical attention for any high fevers, chest pain, shortness of breath, change in behavior, persistent vomiting, bloody stool or any other new or concerning symptoms.  

## 2019-10-14 ENCOUNTER — Encounter: Payer: Medicare Other | Attending: Internal Medicine | Admitting: Internal Medicine

## 2019-10-14 ENCOUNTER — Other Ambulatory Visit: Payer: Self-pay

## 2019-10-14 DIAGNOSIS — I110 Hypertensive heart disease with heart failure: Secondary | ICD-10-CM | POA: Insufficient documentation

## 2019-10-14 DIAGNOSIS — I509 Heart failure, unspecified: Secondary | ICD-10-CM | POA: Insufficient documentation

## 2019-10-14 DIAGNOSIS — D7212 Drug rash with eosinophilia and systemic symptoms syndrome: Secondary | ICD-10-CM | POA: Diagnosis not present

## 2019-10-14 DIAGNOSIS — L97811 Non-pressure chronic ulcer of other part of right lower leg limited to breakdown of skin: Secondary | ICD-10-CM | POA: Diagnosis not present

## 2019-10-14 DIAGNOSIS — I872 Venous insufficiency (chronic) (peripheral): Secondary | ICD-10-CM | POA: Diagnosis not present

## 2019-10-14 DIAGNOSIS — J449 Chronic obstructive pulmonary disease, unspecified: Secondary | ICD-10-CM | POA: Diagnosis not present

## 2019-10-14 DIAGNOSIS — F17200 Nicotine dependence, unspecified, uncomplicated: Secondary | ICD-10-CM | POA: Diagnosis not present

## 2019-10-15 NOTE — Progress Notes (Signed)
MURRAY, DAMEWOOD ([id number]) Visit Report for 10/14/2019 Abuse/Suicide Risk Screen Details Patient Name: Stanley Marks, Stanley Marks Date of Service: 10/14/2019 9:45 AM Medical Record Number: [id number] Patient Account Number: [id number] Date of Birth/Sex: 1941-03-25 (78 y.o. M) Treating RN: Harold Barban Primary Care Stanley Marks: Lamonte Sakai Other Clinician: Referring Jilliann Subramanian: Nance Pear Treating Tehani Mersman/Extender: Tito Dine in Treatment: 0 Abuse/Suicide Risk Screen Items Answer ABUSE RISK SCREEN: Has anyone close to you tried to hurt or harm you recentlyo No Do you feel uncomfortable with anyone in your familyo No Has anyone forced you do things that you didnot want to doo No Electronic Signature(s) Signed: 10/15/2019 4:15:06 PM By: Harold Barban Entered By: Harold Barban on 10/14/2019 09:57:08 Stanley Marks ([id number]) -------------------------------------------------------------------------------- Activities of Daily Living Details Patient Name: Stanley Marks Date of Service: 10/14/2019 9:45 AM Medical Record Number: [id number] Patient Account Number: [id number] Date of Birth/Sex: Nov 22, 1941 (78 y.o. M) Treating RN: Harold Barban Primary Care Laryssa Hassing: Lamonte Sakai Other Clinician: Referring Everlynn Sagun: Nance Pear Treating Ivie Maese/Extender: Tito Dine in Treatment: 0 Activities of Daily Living Items Answer Activities of Daily Living (Please select one for each item) Drive Automobile Completely Able Take Medications Completely Able Use Telephone Completely Able Care for Appearance Completely Able Use Toilet Completely Able Bath / Shower Completely Able Dress Self Completely Able Feed Self Completely Able Walk Completely Able Get In / Out Bed Completely Able Housework Completely Able Prepare Meals Completely Able Handle Money Completely Able Shop for Self Completely Able Electronic Signature(s) Signed: 10/15/2019 4:15:06 PM By: Harold Barban Entered By: Harold Barban on 10/14/2019 09:57:18 Stanley Marks ([id number]) -------------------------------------------------------------------------------- Education Screening Details Patient Name: Stanley Marks Date of Service: 10/14/2019 9:45 AM Medical Record Number: [id number] Patient Account Number: [id number] Date of Birth/Sex: 25-Oct-1941 (78 y.o. M) Treating RN: Harold Barban Primary Care Corben Auzenne: Lamonte Sakai Other Clinician: Referring Kierston Plasencia: Nance Pear Treating Ashleynicole Mcclees/Extender: Tito Dine in Treatment: 0 Primary Learner Assessed: Patient Learning Preferences/Education Level/Primary Language Learning Preference: Explanation Highest Education Level: High School Preferred Language: English Cognitive Barrier Language Barrier: No Translator Needed: No Memory Deficit: No Emotional Barrier: No Cultural/Religious Beliefs Affecting Medical Care: No Physical Barrier Impaired Vision: No Impaired Hearing: No Decreased Hand dexterity: No Knowledge/Comprehension Knowledge Level: High Comprehension Level: High Ability to understand written High instructions: Ability to understand verbal High instructions: Motivation Anxiety Level: Calm Cooperation: Cooperative Education Importance: Acknowledges Need Interest in Health Problems: Asks Questions Perception: Coherent Willingness to Engage in Self- High Management Activities: Readiness to Engage in Self- High Management Activities: Electronic Signature(s) Signed: 10/15/2019 4:15:06 PM By: Harold Barban Entered By: Harold Barban on 10/14/2019 09:57:40 Stanley Marks ([id number]) -------------------------------------------------------------------------------- Fall Risk Assessment Details Patient Name: Stanley Marks Date of Service: 10/14/2019 9:45 AM Medical Record Number: [id number] Patient Account Number: [id number] Date of Birth/Sex: 21-Apr-1941 (78 y.o. M) Treating RN: Harold Barban Primary Care Luba Matzen: Lamonte Sakai Other Clinician: Referring Patience Nuzzo: Nance Pear Treating Mykal Batiz/Extender: Tito Dine in Treatment: 0 Fall Risk Assessment Items Have you had 2 or more falls in the last 12 monthso 0 Yes Have you had any fall that resulted in injury in the last 12 monthso 0 No FALLS RISK SCREEN History of falling - immediate or within 3 months 25 Yes Secondary diagnosis (Do you have 2 or more medical diagnoseso) 0 No Ambulatory aid None/bed rest/wheelchair/nurse 0 No Crutches/cane/walker 0 No Furniture 0 No Intravenous therapy Access/Saline/Heparin Lock 0 No Gait/Transferring Normal/ bed rest/ wheelchair 0 No Weak (short steps with or without shuffle, stooped but able  to lift head while 0 No walking, may seek support from furniture) Impaired (short steps with shuffle, may have difficulty arising from chair, head 0 No down, impaired balance) Mental Status Oriented to own ability 0 Yes Electronic Signature(s) Signed: 10/15/2019 4:15:06 PM By: Harold Barban Entered By: Harold Barban on 10/14/2019 09:57:56 Stanley Marks ([id number]) -------------------------------------------------------------------------------- Foot Assessment Details Patient Name: Stanley Marks Date of Service: 10/14/2019 9:45 AM Medical Record Number: [id number] Patient Account Number: [id number] Date of Birth/Sex: [birth year] (78 y.o. M) Treating RN: Harold Barban Primary Care Alyn Jurney: Lamonte Sakai Other Clinician: Referring Koral Thaden: Nance Pear Treating Jarvis Knodel/Extender: Tito Dine in Treatment: 0 Foot Assessment Items Site Locations + = Sensation present, - = Sensation absent, C = Callus, U = Ulcer R = Redness, W = Warmth, M = Maceration, PU = Pre-ulcerative lesion F = Fissure, S = Swelling, D = Dryness Assessment Right: Left: Other Deformity: No No Prior Foot Ulcer: No No Prior Amputation: No No Charcot Joint: No  No Ambulatory Status: Ambulatory Without Help Gait: Steady Electronic Signature(s) Signed: 10/15/2019 4:15:06 PM By: Harold Barban Entered By: Harold Barban on 10/14/2019 10:03:05 Stanley Marks ([id number]) -------------------------------------------------------------------------------- Nutrition Risk Screening Details Patient Name: Stanley Marks Date of Service: 10/14/2019 9:45 AM Medical Record Number: [id number] Patient Account Number: [id number] Date of Birth/Sex: 10-19-1941 (78 y.o. M) Treating RN: Harold Barban Primary Care Geraldo Haris: Lamonte Sakai Other Clinician: Referring Lamichael Youkhana: Nance Pear Treating Keldan Eplin/Extender: Tito Dine in Treatment: 0 Height (in): 74 Weight (lbs): 300 Body Mass Index (BMI): 38.5 Nutrition Risk Screening Items Score Screening NUTRITION RISK SCREEN: I have an illness or condition that made me change the kind and/or amount of 0 No food I eat I eat fewer than two meals per day 0 No I eat few fruits and vegetables, or milk products 0 No I have three or more drinks of beer, liquor or wine almost every day 0 No I have tooth or mouth problems that make it hard for me to eat 0 No I don't always have enough money to buy the food I need 0 No I eat alone most of the time 0 No I take three or more different prescribed or over-the-counter drugs a day 1 Yes Without wanting to, I have lost or gained 10 pounds in the last six months 0 No I am not always physically able to shop, cook and/or feed myself 0 No Nutrition Protocols Good Risk Protocol Moderate Risk Protocol High Risk Proctocol Risk Level: Good Risk Score: 1 Electronic Signature(s) Signed: 10/15/2019 4:15:06 PM By: Harold Barban Entered By: Harold Barban on 10/14/2019 09:58:03

## 2019-10-16 NOTE — Progress Notes (Signed)
YOREL, OBARR ([id number]) Visit Report for 10/14/2019 Chief Complaint Document Details Patient Name: Stanley Marks, Stanley Marks Date of Service: 10/14/2019 9:45 AM Medical Record Number: [id number] Patient Account Number: [id number] Date of Birth/Sex: 1941/05/12 (78 y.o. M) Treating RN: Cornell Barman Primary Care Provider: Lamonte Sakai Other Clinician: Referring Provider: Nance Pear Treating Provider/Extender: Tito Dine in Treatment: 0 Information Obtained from: Patient Chief Complaint Left calf ulcerations. 10/14/2019; patient is here for review of of a wound on the right anterior mid tibia Electronic Signature(s) Signed: 10/16/2019 7:47:21 AM By: Linton Ham MD Entered By: Linton Ham on 10/14/2019 10:38:56 Stanley Marks ([id number]) -------------------------------------------------------------------------------- HPI Details Patient Name: Stanley Marks Date of Service: 10/14/2019 9:45 AM Medical Record Number: [id number] Patient Account Number: [id number] Date of Birth/Sex: Nov 06, 1941 (78 y.o. M) Treating RN: Cornell Barman Primary Care Provider: Lamonte Sakai Other Clinician: Referring Provider: Nance Pear Treating Provider/Extender: Tito Dine in Treatment: 0 History of Present Illness HPI Description: Pleasant 78 year old with history of chronic venous insufficiency and hypertension. No History of diabetes or peripheral arterial disease. He says that he stopped taking his "fluid pill" so that he wouldn't have to urinate as often. He subsequently developed bilateral lower extremity swelling and left calf ulcerations in late August 2016. Status post bilateral lower extremity endovenous ablation over 5 years ago in [place]. He does not regularly wear compression stockings. Denies any history of DVT. He is ambulating per his baseline. No claudication or rest pain. Tolerated 3 layer compression bandage with Prisma with significant improvement. Recently  declined compression bandages and has been using a Tubigrip for edema control. He returns to clinic for follow-up and is without complaints. No significant pain. No fever or chills. No drainage. READMISSION 10/14/2019 This is a 78 year old man who is in this clinic for years ago with a venous insufficiency wound on the left leg. He was discharged with compression stockings but he does not wear them. He states he developed a blister on the right anterior tibial area in October. He was in the ER on 10/26 after falling and noted to have a wound on his right leg/shin although the patient states the blister was already present. He was felt possibly to be mildly dehydrated he declined IV fluid. X-ray did not show any osseous abnormalities however he was noted to have severe right knee osteoarthritis. The patient has known chronic venous insufficiency. He states he had ablations in his bilateral lower legs many years ago. This seems well verified from the history noted above. He is not a diabetic and does not have known PAD. Past medical history; no diabetes, atrial fibrillation on Xarelto, history of basal cell cancer, COPD continued smoker, hypertension, peripheral vascular disease. ABIs in our clinic were 1.36 on the right and 1.24 on the left Electronic Signature(s) Signed: 10/16/2019 7:47:21 AM By: Linton Ham MD Entered By: Linton Ham on 10/14/2019 10:44:19 Stanley Marks ([id number]) -------------------------------------------------------------------------------- Physical Exam Details Patient Name: Stanley Marks Date of Service: 10/14/2019 9:45 AM Medical Record Number: [id number] Patient Account Number: [id number] Date of Birth/Sex: 1941-05-31 (78 y.o. M) Treating RN: Cornell Barman Primary Care Provider: Lamonte Sakai Other Clinician: Referring Provider: Nance Pear Treating Provider/Extender: Tito Dine in Treatment: 0 Constitutional Sitting or standing Blood  Pressure is within target range for patient.. Pulse regular and within target range for patient.Marland Kitchen Respirations regular, non-labored and within target range.. Temperature is normal and within the target range for the patient.Marland Kitchen appears in no distress. Eyes Conjunctivae clear. No discharge. Respiratory Respiratory effort is  easy and symmetric bilaterally. Rate is normal at rest and on room air.. Markedly decreased air entry bilaterally mildly prolonged expiratory phase work of breathing appears normal. Cardiovascular Femoral pulses palpable on the right. Pedal pulses palpable and strong bilaterally.. Normal edema. Lymphatic Palpable in the popliteal or inguinal area. Integumentary (Hair, Skin) Sniffing and hemosiderin deposition. Psychiatric No evidence of depression, anxiety, or agitation. Calm, cooperative, and communicative. Appropriate interactions and affect.. Notes Wound exam; hearing questions on the right anterior tibia superficial wound with debris washed off with gauze and saline. The surface of this looks quite healthy. He has severe venous hypertension with hemosiderin deposition widely in his feet up to the upper tibia area. Not much in the way of uncontrolled edema Electronic Signature(s) Signed: 10/16/2019 7:47:21 AM By: Linton Ham MD Entered By: Linton Ham on 10/14/2019 10:46:35 Stanley Marks ([id number]) -------------------------------------------------------------------------------- Physician Orders Details Patient Name: Stanley Marks Date of Service: 10/14/2019 9:45 AM Medical Record Number: [id number] Patient Account Number: [id number] Date of Birth/Sex: August 22, 1941 (78 y.o. M) Treating RN: Cornell Barman Primary Care Provider: Lamonte Sakai Other Clinician: Referring Provider: Nance Pear Treating Provider/Extender: Tito Dine in Treatment: 0 Verbal / Phone Orders: No Diagnosis Coding Wound Cleansing Wound #3 Right,Anterior Lower Leg o  Cleanse wound with mild soap and water o May shower with protection. - DO not get dressing wet o No tub bath. Anesthetic (add to Medication List) Wound #3 Right,Anterior Lower Leg o Topical Lidocaine 4% cream applied to wound bed prior to debridement (In Clinic Only). Skin Barriers/Peri-Wound Care Wound #3 Right,Anterior Lower Leg o Barrier cream Primary Wound Dressing Wound #3 Right,Anterior Lower Leg o Hydrafera Blue Ready Transfer Secondary Dressing Wound #3 Right,Anterior Lower Leg o Dry Gauze Dressing Change Frequency Wound #3 Right,Anterior Lower Leg o Change dressing every week o Other: - additional changes if needed Follow-up Appointments Wound #3 Right,Anterior Lower Leg o Return Appointment in 1 week. o Nurse Visit as needed - Patient to call if wrap slides down leg. Edema Control Wound #3 Right,Anterior Lower Leg o 3 Layer Compression System - Right Lower Extremity - unna to anchor Additional Orders / Instructions Wound #3 Right,Anterior Lower Leg o Stop Smoking o Increase protein intake. RODRICK, PRIESTLY ([id number]) Electronic Signature(s) Signed: 10/15/2019 5:19:38 PM By: Gretta Cool, BSN, RN, CWS, Kim RN, BSN Signed: 10/16/2019 7:47:21 AM By: Linton Ham MD Entered By: Gretta Cool, BSN, RN, CWS, Kim on 10/14/2019 10:28:09 BRISYN, KAZEE ([id number]) -------------------------------------------------------------------------------- Problem List Details Patient Name: Stanley Marks Date of Service: 10/14/2019 9:45 AM Medical Record Number: [id number] Patient Account Number: [id number] Date of Birth/Sex: 26-Jul-1941 (78 y.o. M) Treating RN: Cornell Barman Primary Care Provider: Lamonte Sakai Other Clinician: Referring Provider: Nance Pear Treating Provider/Extender: Tito Dine in Treatment: 0 Active Problems ICD-10 Evaluated Encounter Code Description Active Date Today Diagnosis L97.811 Non-pressure chronic ulcer of other part of  right lower leg 10/14/2019 No Yes limited to breakdown of skin I87.331 Chronic venous hypertension (idiopathic) with ulcer and 10/14/2019 No Yes inflammation of right lower extremity Inactive Problems Resolved Problems Electronic Signature(s) Signed: 10/16/2019 7:47:21 AM By: Linton Ham MD Entered By: Linton Ham on 10/14/2019 10:38:25 Stanley Marks ([id number]) -------------------------------------------------------------------------------- Progress Note/History and Physical Details Patient Name: Stanley Marks Date of Service: 10/14/2019 9:45 AM Medical Record Number: [id number] Patient Account Number: [id number] Date of Birth/Sex: 11/09/1941 (78 y.o. M) Treating RN: Cornell Barman Primary Care Provider: Lamonte Sakai Other Clinician: Referring Provider: Nance Pear Treating Provider/Extender: Tito Dine in Treatment: 0 Subjective Chief Complaint Information  obtained from Patient Left calf ulcerations. 10/14/2019; patient is here for review of of a wound on the right anterior mid tibia History of Present Illness (HPI) Pleasant 78 year old with history of chronic venous insufficiency and hypertension. No History of diabetes or peripheral arterial disease. He says that he stopped taking his "fluid pill" so that he wouldn't have to urinate as often. He subsequently developed bilateral lower extremity swelling and left calf ulcerations in late August 2016. Status post bilateral lower extremity endovenous ablation over 5 years ago in [place]. He does not regularly wear compression stockings. Denies any history of DVT. He is ambulating per his baseline. No claudication or rest pain. Tolerated 3 layer compression bandage with Prisma with significant improvement. Recently declined compression bandages and has been using a Tubigrip for edema control. He returns to clinic for follow-up and is without complaints. No significant pain. No fever or chills. No  drainage. READMISSION 10/14/2019 This is a 78 year old man who is in this clinic for years ago with a venous insufficiency wound on the left leg. He was discharged with compression stockings but he does not wear them. He states he developed a blister on the right anterior tibial area in October. He was in the ER on 10/26 after falling and noted to have a wound on his right leg/shin although the patient states the blister was already present. He was felt possibly to be mildly dehydrated he declined IV fluid. X-ray did not show any osseous abnormalities however he was noted to have severe right knee osteoarthritis. The patient has known chronic venous insufficiency. He states he had ablations in his bilateral lower legs many years ago. This seems well verified from the history noted above. He is not a diabetic and does not have known PAD. Past medical history; no diabetes, atrial fibrillation on Xarelto, history of basal cell cancer, COPD continued smoker, hypertension, peripheral vascular disease. ABIs in our clinic were 1.36 on the right and 1.24 on the left Patient History Information obtained from Patient. Allergies No Known Drug Allergies Family History Heart Disease - Mother,Siblings, Hypertension - Mother,Siblings, No family history of Cancer, Diabetes, Kidney Disease, Lung Disease, Seizures, Stroke, Thyroid Problems. Social History Current every day smoker, Marital Status - Married, Alcohol Use - Never, Drug Use - No History, Caffeine Use - Daily. SENDER, STARR ([id number]) Medical History Eyes Patient has history of Cataracts Denies history of Glaucoma, Optic Neuritis Ear/Nose/Mouth/Throat Denies history of Chronic sinus problems/congestion Hematologic/Lymphatic Denies history of Anemia, Hemophilia, Human Immunodeficiency Virus, Lymphedema, Sickle Cell Disease Respiratory Patient has history of Chronic Obstructive Pulmonary Disease (COPD) Denies history of Aspiration, Asthma,  Pneumothorax, Sleep Apnea, Tuberculosis Cardiovascular Patient has history of Arrhythmia - Bradycardia, Congestive Heart Failure, Hypertension, Peripheral Venous Disease Denies history of Angina, Coronary Artery Disease, Deep Vein Thrombosis, Hypotension, Myocardial Infarction, Peripheral Arterial Disease, Phlebitis, Vasculitis Gastrointestinal Denies history of Cirrhosis , Colitis, Crohn s Endocrine Denies history of Type I Diabetes, Type II Diabetes Genitourinary Denies history of End Stage Renal Disease Immunological Denies history of Lupus Erythematosus, Raynaud s, Scleroderma Integumentary (Skin) Denies history of History of Burn, History of pressure wounds Musculoskeletal Patient has history of Osteomyelitis - right shin Denies history of Gout, Rheumatoid Arthritis, Osteoarthritis Neurologic Denies history of Dementia, Neuropathy, Quadriplegia, Paraplegia, Seizure Disorder Oncologic Denies history of Received Chemotherapy, Received Radiation Psychiatric Denies history of Anorexia/bulimia, Confinement Anxiety Medical And Surgical History Notes Constitutional Symptoms (General Health) Fluid swelling; HTN medicarion in past; appendicitis; hepatitis Oncologic Skin Cancer Review of Systems (ROS) Endocrine  Denies complaints or symptoms of Hepatitis, Thyroid disease, Polydypsia (Excessive Thirst). Genitourinary Denies complaints or symptoms of Kidney failure/ Dialysis, Incontinence/dribbling. Immunological Denies complaints or symptoms of Hives, Itching. Integumentary (Skin) Complains or has symptoms of Wounds - Right Anterior Lower Leg, Swelling. Denies complaints or symptoms of Bleeding or bruising tendency, Breakdown. Neurologic Denies complaints or symptoms of Numbness/parasthesias, Focal/Weakness. Psychiatric Denies complaints or symptoms of Anxiety, Claustrophobia. MICHAL, BENESH ([id number]) Objective Constitutional Sitting or standing Blood Pressure is within  target range for patient.. Pulse regular and within target range for patient.Marland Kitchen Respirations regular, non-labored and within target range.. Temperature is normal and within the target range for the patient.Marland Kitchen appears in no distress. Vitals Time Taken: 9:45 AM, Height: 74 in, Source: Stated, Weight: 300 lbs, Source: Stated, BMI: 38.5, Temperature: 98.0 F, Pulse: 74 bpm, Respiratory Rate: 20 breaths/min, Blood Pressure: 110/66 mmHg. Eyes Conjunctivae clear. No discharge. Respiratory Respiratory effort is easy and symmetric bilaterally. Rate is normal at rest and on room air.. Markedly decreased air entry bilaterally mildly prolonged expiratory phase work of breathing appears normal. Cardiovascular Femoral pulses palpable on the right. Pedal pulses palpable and strong bilaterally.. Normal edema. Lymphatic Palpable in the popliteal or inguinal area. Psychiatric No evidence of depression, anxiety, or agitation. Calm, cooperative, and communicative. Appropriate interactions and affect.. General Notes: Wound exam; hearing questions on the right anterior tibia superficial wound with debris washed off with gauze and saline. The surface of this looks quite healthy. He has severe venous hypertension with hemosiderin deposition widely in his feet up to the upper tibia area. Not much in the way of uncontrolled edema Integumentary (Hair, Skin) Sniffing and hemosiderin deposition. Wound #3 status is Open. Original cause of wound was Gradually Appeared. The wound is located on the Right,Anterior Lower Leg. The wound measures 4.7cm length x 3.5cm width x 0.1cm depth; 12.92cm^2 area and 1.292cm^3 volume. There is Fat Layer (Subcutaneous Tissue) Exposed exposed. There is no tunneling or undermining noted. There is a large amount of serosanguineous drainage noted. The wound margin is flat and intact. There is medium (34-66%) pink, pale granulation within the wound bed. There is a medium (34-66%) amount of  necrotic tissue within the wound bed including Eschar and Adherent Slough. 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 There was a Three Layer Compression Therapy Procedure with a pre-treatment ABI of 1.4 by Cornell Barman, RN. Post procedure Diagnosis Wound #3: Same as Pre-Procedure Plan Wound Cleansing: Wound #3 Right,Anterior Lower Leg: Cleanse wound with mild soap and water May shower with protection. - DO not get dressing wet No tub bath. Anesthetic (add to Medication List): Wound #3 Right,Anterior Lower Leg: Topical Lidocaine 4% cream applied to wound bed prior to debridement (In Clinic Only). Skin Barriers/Peri-Wound Care: Wound #3 Right,Anterior Lower Leg: Barrier cream Primary Wound Dressing: Wound #3 Right,Anterior Lower Leg: Hydrafera Blue Ready Transfer Secondary Dressing: Wound #3 Right,Anterior Lower Leg: Dry Gauze Dressing Change Frequency: Wound #3 Right,Anterior Lower Leg: Change dressing every week Other: - additional changes if needed Follow-up Appointments: Wound #3 Right,Anterior Lower Leg: Return Appointment in 1 week. Nurse Visit as needed - Patient to call if wrap slides down leg. Edema Control: Wound #3 Right,Anterior Lower Leg: 3 Layer Compression System - Right  Lower Extremity - unna to anchor Additional Orders / Instructions: Wound #3 Right,Anterior Lower Leg: Stop Smoking Increase protein intake. 1. Hydrofera Blue ABD under 3 layer compression 2. I do not think will have much trouble healing this wound over CERGIO, LANDAU ([id number]) 3. I think the patient is unlikely to wear compression stockings he states there "too tight" I  will talk to him about this again next week. 4. Although he had noncompressible ABIs on the right his peripheral pulses are vibrant I do not think there is a worrisome arterial issue at this point Electronic Signature(s) Signed: 10/16/2019 7:47:21 AM By: Linton Ham MD Entered By: Linton Ham on 10/14/2019 10:47:41 Stanley Marks ([id number]) -------------------------------------------------------------------------------- ROS/PFSH Details Patient Name: Stanley Marks Date of Service: 10/14/2019 9:45 AM Medical Record Number: [id number] Patient Account Number: [id number] Date of Birth/Sex: 1941/11/16 (78 y.o. M) Treating RN: Harold Barban Primary Care Provider: Lamonte Sakai Other Clinician: Referring Provider: Nance Pear Treating Provider/Extender: Tito Dine in Treatment: 0 Label Progress Note Print Version as History and Physical for this encounter Information Obtained From Patient Endocrine Complaints and Symptoms: Negative for: Hepatitis; Thyroid disease; Polydypsia (Excessive Thirst) Medical History: Negative for: Type I Diabetes; Type II Diabetes Genitourinary Complaints and Symptoms: Negative for: Kidney failure/ Dialysis; Incontinence/dribbling Medical History: Negative for: End Stage Renal Disease Immunological Complaints and Symptoms: Negative for: Hives; Itching Medical History: Negative for: Lupus Erythematosus; Raynaudos; Scleroderma Integumentary (Skin) Complaints and Symptoms: Positive for: Wounds - Right Anterior Lower Leg; Swelling Negative for: Bleeding or bruising tendency; Breakdown Medical History: Negative for: History of Burn; History of pressure wounds Neurologic Complaints and Symptoms: Negative for: Numbness/parasthesias; Focal/Weakness Medical History: Negative for: Dementia; Neuropathy; Quadriplegia; Paraplegia; Seizure Disorder Psychiatric Complaints and Symptoms: Negative for: Anxiety; Claustrophobia Medical  HistoryRYA, ROEL ([id number]) Negative for: Anorexia/bulimia; Confinement Anxiety Constitutional Symptoms (General Health) Medical History: Past Medical History Notes: Fluid swelling; HTN medicarion in past; appendicitis; hepatitis Eyes Medical History: Positive for: Cataracts Negative for: Glaucoma; Optic Neuritis Ear/Nose/Mouth/Throat Medical History: Negative for: Chronic sinus problems/congestion Hematologic/Lymphatic Medical History: Negative for: Anemia; Hemophilia; Human Immunodeficiency Virus; Lymphedema; Sickle Cell Disease Respiratory Medical History: Positive for: Chronic Obstructive Pulmonary Disease (COPD) Negative for: Aspiration; Asthma; Pneumothorax; Sleep Apnea; Tuberculosis Cardiovascular Medical History: Positive for: Arrhythmia - Bradycardia; Congestive Heart Failure; Hypertension; Peripheral Venous Disease Negative for: Angina; Coronary Artery Disease; Deep Vein Thrombosis; Hypotension; Myocardial Infarction; Peripheral Arterial Disease; Phlebitis; Vasculitis Gastrointestinal Medical History: Negative for: Cirrhosis ; Colitis; Crohnos Musculoskeletal Medical History: Positive for: Osteomyelitis - right shin Negative for: Gout; Rheumatoid Arthritis; Osteoarthritis Oncologic Medical History: Negative for: Received Chemotherapy; Received Radiation Past Medical History Notes: Skin Cancer HBO Extended History Items Eyes: Cataracts JEHAN, HAGEDORN ([id number]) Immunizations Pneumococcal Vaccine: Received Pneumococcal Vaccination: Yes Implantable Devices No devices added Family and Social History Cancer: No; Diabetes: No; Heart Disease: Yes - Mother,Siblings; Hypertension: Yes - Mother,Siblings; Kidney Disease: No; Lung Disease: No; Seizures: No; Stroke: No; Thyroid Problems: No; Current every day smoker; Marital Status - Married; Alcohol Use: Never; Drug Use: No History; Caffeine Use: Daily; Financial Concerns: No; Food, Clothing or Shelter  Needs: No; Support System Lacking: No; Transportation Concerns: No Electronic Signature(s) Signed: 10/15/2019 4:15:06 PM By: Harold Barban Signed: 10/16/2019 7:47:21 AM By: Linton Ham MD Entered By: Harold Barban on 10/14/2019 09:57:00 Stanley Marks ([id number]) -------------------------------------------------------------------------------- SuperBill Details Patient Name: Stanley Marks Date of Service: 10/14/2019 Medical Record Number: [id number] Patient Account Number: [id number] Date of Birth/Sex: 10-03-1941 (78 y.o. M) Treating RN: Cornell Barman Primary Care Provider: Lamonte Sakai Other  Clinician: Referring Provider: Nance Pear Treating Provider/Extender: Tito Dine in Treatment: 0 Diagnosis Coding ICD-10 Codes Code Description L97.811 Non-pressure chronic ulcer of other part of right lower leg limited to breakdown of skin I87.331 Chronic venous hypertension (idiopathic) with ulcer and inflammation of right lower extremity Facility Procedures CPT4 Code Description: [id number] - WOUND CARE VISIT-LEV 3 EST PT Modifier: Quantity: 1 CPT4 Code Description: [id number] (Facility Use Only) [phone] - [facility] COMPRS LWR RT LEG Modifier: Quantity: 1 Physician Procedures CPT4: Description Modifier Quantity Code [id number] - WC PHYS LEVEL 3 - EST PT 1 ICD-10 Diagnosis Description L97.811 Non-pressure chronic ulcer of other part of right lower leg limited to breakdown of skin I87.331 Chronic venous hypertension  (idiopathic) with ulcer and inflammation of right lower extremity Electronic Signature(s) Signed: 10/15/2019 7:56:10 AM By: Gretta Cool, BSN, RN, CWS, Kim RN, BSN Signed: 10/16/2019 7:47:21 AM By: Linton Ham MD Entered By: Gretta Cool, BSN, RN, CWS, Kim on 10/15/2019 07:56:10

## 2019-10-16 NOTE — Progress Notes (Signed)
DATRON, LOPIANO ([id number]) Visit Report for 10/14/2019 Allergy List Details Patient Name: Stanley Marks, Stanley Marks Date of Service: 10/14/2019 9:45 AM Medical Record Number: [id number] Patient Account Number: [id number] Date of Birth/Sex: 1940-12-22 (78 y.o. M) Treating RN: Harold Barban Primary Care Daymeon Fischman: Lamonte Sakai Other Clinician: Referring Bali Lyn: Nance Pear Treating Marianny Goris/Extender: Ricard Dillon Weeks in Treatment: 0 Allergies Active Allergies No Known Drug Allergies Allergy Notes Electronic Signature(s) Signed: 10/15/2019 4:15:06 PM By: Harold Barban Entered By: Harold Barban on 10/14/2019 09:53:55 Stanley Marks ([id number]) -------------------------------------------------------------------------------- Arrival Information Details Patient Name: Stanley Marks Date of Service: 10/14/2019 9:45 AM Medical Record Number: [id number] Patient Account Number: [id number] Date of Birth/Sex: 04/04/1941 (78 y.o. M) Treating RN: Harold Barban Primary Care Maximo Spratling: Lamonte Sakai Other Clinician: Referring Pauletta Pickney: Nance Pear Treating Quinley Nesler/Extender: Tito Dine in Treatment: 0 Visit Information Patient Arrived: Ambulatory Arrival Time: 09:47 Accompanied By: self Transfer Assistance: None Patient Identification Verified: Yes Secondary Verification Process Yes Completed: Patient Has Alerts: Yes Patient Alerts: Patient on Blood Thinner Xarelto History Since Last Visit Added or deleted any medications: No Any new allergies or adverse reactions: No Had a fall or experienced change in activities of daily living that may affect risk of falls: No Signs or symptoms of abuse/neglect since last visito No Hospitalized since last visit: No Has Dressing in Place as Prescribed: Yes Electronic Signature(s) Signed: 10/14/2019 10:05:48 AM By: Montey Hora Entered By: Montey Hora on 10/14/2019 10:05:47 Stanley Marks  ([id number]) -------------------------------------------------------------------------------- Clinic Level of Care Assessment Details Patient Name: Stanley Marks Date of Service: 10/14/2019 9:45 AM Medical Record Number: [id number] Patient Account Number: [id number] Date of Birth/Sex: [birth year] (78 y.o. M) Treating RN: Cornell Barman Primary Care Bevin Mayall: Lamonte Sakai Other Clinician: Referring Suzane Vanderweide: Nance Pear Treating Rosabel Sermeno/Extender: Tito Dine in Treatment: 0 Clinic Level of Care Assessment Items TOOL 1 Quantity Score []  - Use when EandM and Procedure is performed on INITIAL visit 0 ASSESSMENTS - Nursing Assessment / Reassessment X - General Physical Exam (combine w/ comprehensive assessment (listed just below) when 1 20 performed on new pt. evals) X- 1 25 Comprehensive Assessment (HX, ROS, Risk Assessments, Wounds Hx, etc.) ASSESSMENTS - Wound and Skin Assessment / Reassessment []  - Dermatologic / Skin Assessment (not related to wound area) 0 ASSESSMENTS - Ostomy and/or Continence Assessment and Care []  - Incontinence Assessment and Management 0 []  - 0 Ostomy Care Assessment and Management (repouching, etc.) PROCESS - Coordination of Care X - Simple Patient / Family Education for ongoing care 1 15 []  - 0 Complex (extensive) Patient / Family Education for ongoing care []  - 0 Staff obtains Programmer, systems, Records, Test Results / Process Orders []  - 0 Staff telephones HHA, Nursing Homes / Clarify orders / etc []  - 0 Routine Transfer to another Facility (non-emergent condition) []  - 0 Routine Hospital Admission (non-emergent condition) X- 1 15 New Admissions / Biomedical engineer / Ordering NPWT, Apligraf, etc. []  - 0 Emergency Hospital Admission (emergent condition) PROCESS - Special Needs []  - Pediatric / Minor Patient Management 0 []  - 0 Isolation Patient Management []  - 0 Hearing / Language / Visual special needs []  - 0 Assessment of Community  assistance (transportation, D/C planning, etc.) []  - 0 Additional assistance / Altered mentation []  - 0 Support Stanley Marks(s) Assessment (bed, cushion, seat, etc.) Stanley Marks, Stanley Marks ([id number]) INTERVENTIONS - Miscellaneous []  - External ear exam 0 []  - 0 Patient Transfer (multiple staff / Civil Service fast streamer / Similar devices) []  - 0 Simple Staple / Suture removal (25 or less) []  -  0 Complex Staple / Suture removal (26 or more) []  - 0 Hypo/Hyperglycemic Management (do not check if billed separately) X- 1 15 Ankle / Brachial Index (ABI) - do not check if billed separately Has the patient been seen at the hospital within the last three years: Yes Total Score: 90 Level Of Care: New/Established - Level 3 Electronic Signature(s) Signed: 10/15/2019 5:19:38 PM By: Gretta Cool, BSN, RN, CWS, Kim RN, BSN Entered By: Gretta Cool, BSN, RN, CWS, Kim on 10/14/2019 10:22:35 Stanley Marks ([id number]) -------------------------------------------------------------------------------- Compression Therapy Details Patient Name: Stanley Marks Date of Service: 10/14/2019 9:45 AM Medical Record Number: [id number] Patient Account Number: [id number] Date of Birth/Sex: 05-Jul-1941 (78 y.o. M) Treating RN: Cornell Barman Primary Care Kamariah Fruchter: Lamonte Sakai Other Clinician: Referring Chabely Norby: Nance Pear Treating Teyla Skidgel/Extender: Tito Dine in Treatment: 0 Compression Therapy Performed for Wound Assessment: Wound #3 Right,Anterior Lower Leg Performed By: Clinician Cornell Barman, RN Compression Type: Three Layer Pre Treatment ABI: 1.4 Post Procedure Diagnosis Same as Pre-procedure Electronic Signature(s) Signed: 10/15/2019 5:19:38 PM By: Gretta Cool, BSN, RN, CWS, Kim RN, BSN Entered By: Gretta Cool, BSN, RN, CWS, Kim on 10/14/2019 10:19:35 Stanley Marks ([id number]) -------------------------------------------------------------------------------- Encounter Discharge Information Details Patient Name: Stanley Marks Date of  Service: 10/14/2019 9:45 AM Medical Record Number: [id number] Patient Account Number: [id number] Date of Birth/Sex: 1940/12/20 (78 y.o. M) Treating RN: Cornell Barman Primary Care Kenney Going: Lamonte Sakai Other Clinician: Referring Shauntee Karp: Nance Pear Treating Zachory Mangual/Extender: Tito Dine in Treatment: 0 Encounter Discharge Information Items Discharge Condition: Stable Ambulatory Status: Ambulatory Discharge Destination: Home Transportation: Private Auto Accompanied By: self Schedule Follow-up Appointment: Yes Clinical Summary of Care: Electronic Signature(s) Signed: 10/15/2019 5:19:38 PM By: Gretta Cool, BSN, RN, CWS, Kim RN, BSN Entered By: Gretta Cool, BSN, RN, CWS, Kim on 10/14/2019 10:24:25 Stanley Marks ([id number]) -------------------------------------------------------------------------------- Lower Extremity Assessment Details Patient Name: Stanley Marks Date of Service: 10/14/2019 9:45 AM Medical Record Number: [id number] Patient Account Number: [id number] Date of Birth/Sex: 09-26-1941 (78 y.o. M) Treating RN: Harold Barban Primary Care Ronald Vinsant: Lamonte Sakai Other Clinician: Referring Eveleigh Crumpler: Nance Pear Treating Divya Munshi/Extender: Tito Dine in Treatment: 0 Edema Assessment Assessed: [Left: No] [Right: No] Edema: [Left: Yes] [Right: Yes] Calf Left: Right: Point of Measurement: 36 cm From Medial Instep 42 cm 41.5 cm Ankle Left: Right: Point of Measurement: 12 cm From Medial Instep 26.5 cm 26.5 cm Vascular Assessment Pulses: Dorsalis Pedis Palpable: [Left:Yes] [Right:Yes] Doppler Audible: [Left:Yes] [Right:Yes] Posterior Tibial Palpable: [Left:Yes] [Right:Yes] Doppler Audible: [Left:Yes] [Right:Yes] Blood Pressure: Brachial: [Left:110] [Right:110] Dorsalis Pedis: 136 [Left:Dorsalis Pedis: 150] Ankle: Posterior Tibial: 134 [Left:Posterior Tibial: 140 1.24] [Right:1.36] Electronic Signature(s) Signed: 10/15/2019 4:15:06 PM By: Harold Barban Entered By: Harold Barban on 10/14/2019 10:02:44 Stanley Marks ([id number]) -------------------------------------------------------------------------------- Multi Wound Chart Details Patient Name: Stanley Marks Date of Service: 10/14/2019 9:45 AM Medical Record Number: [id number] Patient Account Number: [id number] Date of Birth/Sex: May 31, 1941 (78 y.o. M) Treating RN: Cornell Barman Primary Care Arienne Gartin: Lamonte Sakai Other Clinician: Referring Makinsley Schiavi: Nance Pear Treating Kristopher Delk/Extender: Tito Dine in Treatment: 0 Vital Signs Height(in): 74 Pulse(bpm): 69 Weight(lbs): 300 Blood Pressure(mmHg): 110/66 Body Mass Index(BMI): 39 Temperature(F): 98.0 Respiratory Rate 20 (breaths/min): Photos: [N/A:N/A] Wound Location: Right Lower Leg - Anterior N/A N/A Wounding Event: Gradually Appeared N/A N/A Primary Etiology: Venous Leg Ulcer N/A N/A Date Acquired: 09/30/2019 N/A N/A Weeks of Treatment: 0 N/A N/A Wound Status: Open N/A N/A Measurements L x W x D 4.7x3.5x0.1 N/A N/A (cm) Area (cm) : 12.92 N/A N/A Volume (cm) : 1.292 N/A N/A  Classification: Full Thickness Without N/A N/A Exposed Support Structures Exudate Amount: Large N/A N/A Exudate Type: Serosanguineous N/A N/A Exudate Color: red, brown N/A N/A Wound Margin: Flat and Intact N/A N/A Granulation Amount: Medium (34-66%) N/A N/A Granulation Quality: Pink, Pale N/A N/A Necrotic Amount: Medium (34-66%) N/A N/A Necrotic Tissue: Eschar, Adherent Slough N/A N/A Exposed Structures: Fat Layer (Subcutaneous N/A N/A Tissue) Exposed: Yes Fascia: No Tendon: No Muscle: No Joint: No Bone: No Epithelialization: None N/A N/A Procedures Performed: Compression Therapy N/A N/A Stanley Marks, Stanley Marks ([id number]) Treatment Notes Wound #3 (Right, Anterior Lower Leg) Notes Hydrafera Blue, gauze, 3 Layer Right, Electronic Signature(s) Signed: 10/16/2019 7:47:21 AM By: Linton Ham MD Entered By: Linton Ham on 10/14/2019 10:38:32 Stanley Marks ([id number]) -------------------------------------------------------------------------------- Multi-Disciplinary Care Plan Details Patient Name: Stanley Marks Date of Service: 10/14/2019 9:45 AM Medical Record Number: [id number] Patient Account Number: [id number] Date of Birth/Sex: 06-06-1941 (78 y.o. M) Treating RN: Cornell Barman Primary Care [place] Lesure: Lamonte Sakai Other Clinician: Referring Maddex Garlitz: Nance Pear Treating Alec Mcphee/Extender: Tito Dine in Treatment: 0 Active Inactive Abuse / Safety / Falls / Self Care Management Nursing Diagnoses: Impaired physical mobility Potential for falls Goals: Patient will remain injury free related to falls Date Initiated: 10/14/2019 Target Resolution Date: 10/21/2019 Goal Status: Active Patient/caregiver will verbalize understanding of skin care regimen Date Initiated: 10/14/2019 Target Resolution Date: 10/21/2019 Goal Status: Active Interventions: Assess fall risk on admission and as needed Notes: Orientation to the Wound Care Program Nursing Diagnoses: Knowledge deficit related to the wound healing center program Goals: Patient/caregiver will verbalize understanding of the [facility] Date Initiated: 10/14/2019 Target Resolution Date: 10/15/2019 Goal Status: Active Interventions: Provide education on orientation to the wound center Notes: Venous Leg Ulcer Nursing Diagnoses: Actual venous Insuffiency (use after diagnosis is confirmed) Goals: Patient/caregiver will verbalize understanding of disease process and disease management Date Initiated: 10/14/2019 Target Resolution Date: 10/21/2019 Stanley Marks, Stanley Marks ([id number]) Goal Status: Active Interventions: Assess peripheral edema status every visit. Treatment Activities: Therapeutic compression applied : 10/14/2019 Notes: Wound/Skin Impairment Nursing Diagnoses: Impaired tissue  integrity Goals: Ulcer/skin breakdown will have a volume reduction of 30% by week 4 Date Initiated: 10/14/2019 Target Resolution Date: 11/11/2019 Goal Status: Active Interventions: Assess ulceration(s) every visit Treatment Activities: Skin care regimen initiated : 10/14/2019 Topical wound management initiated : 10/14/2019 Notes: Electronic Signature(s) Signed: 10/15/2019 5:19:38 PM By: Gretta Cool, BSN, RN, CWS, Kim RN, BSN Entered By: Gretta Cool, BSN, RN, CWS, Kim on 10/14/2019 10:18:49 Stanley Marks ([id number]) -------------------------------------------------------------------------------- Pain Assessment Details Patient Name: Stanley Marks Date of Service: 10/14/2019 9:45 AM Medical Record Number: [id number] Patient Account Number: [id number] Date of Birth/Sex: 02/06/1941 (78 y.o. M) Treating RN: Harold Barban Primary Care Ailee Pates: Lamonte Sakai Other Clinician: Referring Rashanda Magloire: Nance Pear Treating Shantay Sonn/Extender: Tito Dine in Treatment: 0 Active Problems Location of Pain Severity and Description of Pain Patient Has Paino Yes Site Locations Rate the pain. Current Pain Level: 6 Pain Management and Medication Current Pain Management: Electronic Signature(s) Signed: 10/15/2019 4:15:06 PM By: Harold Barban Entered By: Harold Barban on 10/14/2019 09:48:25 Stanley Marks ([id number]) -------------------------------------------------------------------------------- Patient/Caregiver Education Details Patient Name: Stanley Marks Date of Service: 10/14/2019 9:45 AM Medical Record Number: [id number] Patient Account Number: [id number] Date of Birth/Gender: 02-04-1941 (78 y.o. M) Treating RN: Cornell Barman Primary Care Physician: Lamonte Sakai Other Clinician: Referring Physician: Nance Pear Treating Physician/Extender: Tito Dine in Treatment: 0 Education Assessment Education Provided To: Patient Education Topics Provided Venous: Handouts:  Controlling Swelling with Multilayered Compression Wraps Methods: Demonstration, Explain/Verbal  Responses: State content correctly Wound/Skin Impairment: Handouts: Caring for Your Ulcer Methods: Demonstration, Explain/Verbal Responses: Return demonstration correctly Electronic Signature(s) Signed: 10/15/2019 5:19:38 PM By: Gretta Cool, BSN, RN, CWS, Kim RN, BSN Entered By: Gretta Cool, BSN, RN, CWS, Kim on 10/14/2019 10:23:12 Stanley Marks ([id number]) -------------------------------------------------------------------------------- Wound Assessment Details Patient Name: Stanley Marks Date of Service: 10/14/2019 9:45 AM Medical Record Number: [id number] Patient Account Number: [id number] Date of Birth/Sex: 02-03-1941 (78 y.o. M) Treating RN: Harold Barban Primary Care Amaar Oshita: Lamonte Sakai Other Clinician: Referring Clarie Camey: Nance Pear Treating Yaeko Fazekas/Extender: Tito Dine in Treatment: 0 Wound Status Wound Number: 3 Primary Etiology: Venous Leg Ulcer Wound Location: Right Lower Leg - Anterior Wound Status: Open Wounding Event: Gradually Appeared Date Acquired: 09/30/2019 Weeks Of Treatment: 0 Clustered Wound: No Photos Wound Measurements Length: (cm) 4.7 Width: (cm) 3.5 Depth: (cm) 0.1 Area: (cm) 12.92 Volume: (cm) 1.292 % Reduction in Area: % Reduction in Volume: Epithelialization: None Tunneling: No Undermining: No Wound Description Full Thickness Without Exposed Support Classification: Structures Wound Margin: Flat and Intact Exudate Large Amount: Exudate Type: Serosanguineous Exudate Color: red, brown Foul Odor After Cleansing: No Slough/Fibrino Yes Wound Bed Granulation Amount: Medium (34-66%) Exposed Structure Granulation Quality: Pink, Pale Fascia Exposed: No Necrotic Amount: Medium (34-66%) Fat Layer (Subcutaneous Tissue) Exposed: Yes Necrotic Quality: Eschar, Adherent Slough Tendon Exposed: No Muscle Exposed: No Joint Exposed:  No Bone Exposed: No Stanley Marks, Stanley Marks ([id number]) Treatment Notes Wound #3 (Right, Anterior Lower Leg) Notes Hydrafera Blue, gauze, 3 Layer Right, Electronic Signature(s) Signed: 10/15/2019 4:15:06 PM By: Harold Barban Entered By: Harold Barban on 10/14/2019 09:53:33 Stanley Marks ([id number]) -------------------------------------------------------------------------------- Vitals Details Patient Name: Stanley Marks Date of Service: 10/14/2019 9:45 AM Medical Record Number: [id number] Patient Account Number: [id number] Date of Birth/Sex: [birth year] (78 y.o. M) Treating RN: Harold Barban Primary Care Valeria Krisko: Lamonte Sakai Other Clinician: Referring Jshon Ibe: Nance Pear Treating Renetta Suman/Extender: Tito Dine in Treatment: 0 Vital Signs Time Taken: 09:45 Temperature (F): 98.0 Height (in): 74 Pulse (bpm): 74 Source: Stated Respiratory Rate (breaths/min): 20 Weight (lbs): 300 Blood Pressure (mmHg): 110/66 Source: Stated Reference Range: 80 - 120 mg / dl Body Mass Index (BMI): 38.5 Electronic Signature(s) Signed: 10/15/2019 4:15:06 PM By: Harold Barban Entered By: Harold Barban on 10/14/2019 09:50:25

## 2019-10-21 ENCOUNTER — Other Ambulatory Visit: Payer: Self-pay

## 2019-10-21 ENCOUNTER — Encounter: Payer: Medicare Other | Admitting: Internal Medicine

## 2019-10-21 DIAGNOSIS — L97811 Non-pressure chronic ulcer of other part of right lower leg limited to breakdown of skin: Secondary | ICD-10-CM | POA: Diagnosis not present

## 2019-10-22 NOTE — Progress Notes (Signed)
FERRIN, CREACH ([id number]) Visit Report for 10/21/2019 Arrival Information Details Patient Name: Stanley Marks, Stanley Marks Date of Service: 10/21/2019 9:15 AM Medical Record Number: [id number] Patient Account Number: [id number] Date of Birth/Sex: July 11, 1941 (78 y.o. M) Treating RN: Army Melia Primary Care Jose Corvin: Lamonte Sakai Other Clinician: Referring Vear Staton: Lamonte Sakai Treating Joelynn Dust/Extender: Tito Dine in Treatment: 1 Visit Information History Since Last Visit Added or deleted any medications: No Patient Arrived: Ambulatory Any new allergies or adverse reactions: No Arrival Time: 09:14 Had a fall or experienced change in No Accompanied By: self activities of daily living that may affect Transfer Assistance: None risk of falls: Patient Identification Verified: Yes Signs or symptoms of abuse/neglect since last visito No Patient Has Alerts: Yes Hospitalized since last visit: No Patient Alerts: Patient on Blood Thinner Has Dressing in Place as Prescribed: Yes Xarelto Pain Present Now: No Electronic Signature(s) Signed: 10/22/2019 3:26:34 PM By: Army Melia Entered By: Army Melia on 10/21/2019 09:15:04 Stanley Marks ([id number]) -------------------------------------------------------------------------------- Clinic Level of Care Assessment Details Patient Name: Stanley Marks Date of Service: 10/21/2019 9:15 AM Medical Record Number: [id number] Patient Account Number: [id number] Date of Birth/Sex: Jul 28, 1941 (78 y.o. M) Treating RN: Cornell Barman Primary Care Yarethzy Croak: Lamonte Sakai Other Clinician: Referring Latina Frank: Lamonte Sakai Treating Matilde Pottenger/Extender: Tito Dine in Treatment: 1 Clinic Level of Care Assessment Items TOOL 4 Quantity Score []  - Use when only an EandM is performed on FOLLOW-UP visit 0 ASSESSMENTS - Nursing Assessment / Reassessment X - Reassessment of Co-morbidities (includes updates in patient status) 1 10 X- 1  5 Reassessment of Adherence to Treatment Plan ASSESSMENTS - Wound and Skin Assessment / Reassessment X - Simple Wound Assessment / Reassessment - one wound 1 5 []  - 0 Complex Wound Assessment / Reassessment - multiple wounds []  - 0 Dermatologic / Skin Assessment (not related to wound area) ASSESSMENTS - Focused Assessment []  - Circumferential Edema Measurements - multi extremities 0 []  - 0 Nutritional Assessment / Counseling / Intervention []  - 0 Lower Extremity Assessment (monofilament, tuning fork, pulses) []  - 0 Peripheral Arterial Disease Assessment (using hand held doppler) ASSESSMENTS - Ostomy and/or Continence Assessment and Care []  - Incontinence Assessment and Management 0 []  - 0 Ostomy Care Assessment and Management (repouching, etc.) PROCESS - Coordination of Care X - Simple Patient / Family Education for ongoing care 1 15 []  - 0 Complex (extensive) Patient / Family Education for ongoing care []  - 0 Staff obtains Programmer, systems, Records, Test Results / Process Orders []  - 0 Staff telephones HHA, Nursing Homes / Clarify orders / etc []  - 0 Routine Transfer to another Facility (non-emergent condition) []  - 0 Routine Hospital Admission (non-emergent condition) []  - 0 New Admissions / Biomedical engineer / Ordering NPWT, Apligraf, etc. []  - 0 Emergency Hospital Admission (emergent condition) X- 1 10 Simple Discharge Coordination Stanley Marks, Stanley Marks ([id number]) []  - 0 Complex (extensive) Discharge Coordination PROCESS - Special Needs []  - Pediatric / Minor Patient Management 0 []  - 0 Isolation Patient Management []  - 0 Hearing / Language / Visual special needs []  - 0 Assessment of Community assistance (transportation, D/C planning, etc.) []  - 0 Additional assistance / Altered mentation []  - 0 Support Surface(s) Assessment (bed, cushion, seat, etc.) INTERVENTIONS - Wound Cleansing / Measurement X - Simple Wound Cleansing - one wound 1 5 []  - 0 Complex Wound  Cleansing - multiple wounds X- 1 5 Wound Imaging (photographs - any number of wounds) []  - 0 Wound Tracing (instead of photographs) X- 1 5 Simple  Wound Measurement - one wound []  - 0 Complex Wound Measurement - multiple wounds INTERVENTIONS - Wound Dressings []  - Small Wound Dressing one or multiple wounds 0 []  - 0 Medium Wound Dressing one or multiple wounds X- 1 20 Large Wound Dressing one or multiple wounds []  - 0 Application of Medications - topical []  - 0 Application of Medications - injection INTERVENTIONS - Miscellaneous []  - External ear exam 0 []  - 0 Specimen Collection (cultures, biopsies, blood, body fluids, etc.) []  - 0 Specimen(s) / Culture(s) sent or taken to Lab for analysis []  - 0 Patient Transfer (multiple staff / Civil Service fast streamer / Similar devices) []  - 0 Simple Staple / Suture removal (25 or less) []  - 0 Complex Staple / Suture removal (26 or more) []  - 0 Hypo / Hyperglycemic Management (close monitor of Blood Glucose) []  - 0 Ankle / Brachial Index (ABI) - do not check if billed separately X- 1 5 Vital Signs Stanley Marks, Stanley Marks ([id number]) Has the patient been seen at the hospital within the last three years: Yes Total Score: 85 Level Of Care: New/Established - Level 3 Electronic Signature(s) Signed: 10/21/2019 5:21:25 PM By: Gretta Cool, BSN, RN, CWS, Kim RN, BSN Entered By: Gretta Cool, BSN, RN, CWS, Kim on 10/21/2019 09:37:56 Stanley Marks ([id number]) -------------------------------------------------------------------------------- Encounter Discharge Information Details Patient Name: Stanley Marks Date of Service: 10/21/2019 9:15 AM Medical Record Number: [id number] Patient Account Number: [id number] Date of Birth/Sex: [birth year] (78 y.o. M) Treating RN: Cornell Barman Primary Care Alphonse Asbridge: Lamonte Sakai Other Clinician: Referring Ilamae Geng: Lamonte Sakai Treating Ame Heagle/Extender: Tito Dine in Treatment: 1 Encounter Discharge Information  Items Discharge Condition: Stable Ambulatory Status: Ambulatory Discharge Destination: Home Transportation: Private Auto Accompanied By: self Schedule Follow-up Appointment: Yes Clinical Summary of Care: Electronic Signature(s) Signed: 10/21/2019 5:21:25 PM By: Gretta Cool, BSN, RN, CWS, Kim RN, BSN Entered By: Gretta Cool, BSN, RN, CWS, Kim on 10/21/2019 09:39:46 Stanley Marks ([id number]) -------------------------------------------------------------------------------- Lower Extremity Assessment Details Patient Name: Stanley Marks Date of Service: 10/21/2019 9:15 AM Medical Record Number: [id number] Patient Account Number: [id number] Date of Birth/Sex: 1940/12/29 (78 y.o. M) Treating RN: Army Melia Primary Care Encarnacion Bole: Lamonte Sakai Other Clinician: Referring Viaan Knippenberg: Lamonte Sakai Treating Keeleigh Terris/Extender: Tito Dine in Treatment: 1 Edema Assessment Assessed: [Left: No] [Right: No] Edema: [Left: N] [Right: o] Calf Left: Right: Point of Measurement: 36 cm From Medial Instep cm 40.5 cm Ankle Left: Right: Point of Measurement: 12 cm From Medial Instep cm 24 cm Vascular Assessment Pulses: Dorsalis Pedis Palpable: [Right:Yes] Electronic Signature(s) Signed: 10/22/2019 3:26:34 PM By: Army Melia Entered By: Army Melia on 10/21/2019 09:25:47 Stanley Marks ([id number]) -------------------------------------------------------------------------------- Multi Wound Chart Details Patient Name: Stanley Marks Date of Service: 10/21/2019 9:15 AM Medical Record Number: [id number] Patient Account Number: [id number] Date of Birth/Sex: 04-17-1941 (78 y.o. 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), Arrhythmia, Congestive Heart Failure, Hypertension, Peripheral Venous Disease, Osteomyelitis Date Acquired: 09/30/2019 N/A N/A Weeks of Treatment: 1 N/A N/A Wound Status: Open N/A N/A Measurements L x W x D 3.9x2x0.1 N/A N/A (cm) Area (cm) : 6.126 N/A N/A Volume (cm) :  0.613 N/A N/A % Reduction in Area: 52.60% N/A N/A % Reduction in Volume: 52.60% N/A N/A Classification: Full Thickness Without N/A N/A Exposed Support Structures Exudate Amount: Large N/A N/A Exudate Type: Serosanguineous N/A N/A Exudate Color: red, brown N/A N/A Wound Margin: Flat and Intact N/A N/A Granulation Amount: Medium (34-66%) N/A N/A Granulation Quality: Pink, Pale N/A N/A Necrotic Amount: Medium (34-66%) N/A N/A Necrotic Tissue: Eschar, Adherent Slough N/A N/A Exposed Structures: N/A N/A Stanley Marks, Stanley Marks ([id number]) Fat Layer (Subcutaneous Tissue) Exposed: Yes Fascia: No Tendon: No Muscle: No Joint: No Bone: No Epithelialization: None N/A N/A Treatment Notes Wound #3 (Right, Anterior Lower Leg) Notes Hydrafera Blue, gauze, unna to anchor, Kerlix and Event organiser) Signed: 10/21/2019 5:41:49 PM By: Linton Ham MD Entered By: Linton Ham on 10/21/2019 09:43:52 Stanley Marks ([id number]) -------------------------------------------------------------------------------- Multi-Disciplinary Care Plan Details Patient Name: Stanley Marks Date of Service: 10/21/2019 9:15 AM Medical Record Number: [id number] Patient Account Number: [id number] Date of Birth/Sex: 03-26-1941 (78 y.o. M) Treating RN: Cornell Barman Primary Care Alaney Witter: Lamonte Sakai Other Clinician: Referring Lissett Favorite: Lamonte Sakai Treating Jasiel Belisle/Extender: Tito Dine in Treatment: 1 Active Inactive Abuse / Safety / Falls / Self Care Management Nursing Diagnoses: Impaired physical mobility Potential for falls Goals: Patient will remain injury free related to falls Date Initiated: 10/14/2019 Target Resolution Date: 10/21/2019 Goal Status: Active Patient/caregiver will verbalize understanding of skin care regimen Date Initiated: 10/14/2019 Target Resolution Date: 10/21/2019 Goal Status: Active Interventions: Assess fall risk on admission and as needed Notes: Orientation to the Wound Care Program Nursing Diagnoses: Knowledge deficit related to the wound healing center program Goals: Patient/caregiver will verbalize understanding of the [place] Date Initiated: 10/14/2019 Target Resolution Date: 10/15/2019 Goal Status: Active Interventions: Provide education on orientation to the wound center Notes: Venous Leg Ulcer Nursing Diagnoses: Actual venous Insuffiency (use after diagnosis is confirmed) Goals: Patient/caregiver will verbalize understanding of disease process and disease management Date Initiated: 10/14/2019 Target Resolution Date: 10/21/2019 Stanley Marks, Stanley Marks ([id number]) Goal Status: Active Interventions: Assess peripheral edema status every visit. Treatment Activities: Therapeutic compression applied : 10/14/2019 Notes: Wound/Skin Impairment Nursing Diagnoses: Impaired tissue integrity Goals: Ulcer/skin breakdown will have a volume reduction of 30% by week 4 Date Initiated: 10/14/2019 Target Resolution Date: 11/11/2019 Goal Status: Active Interventions: Assess ulceration(s) every visit Treatment Activities: Skin care regimen initiated : 10/14/2019 Topical wound management initiated : 10/14/2019 Notes: Electronic Signature(s) Signed: 10/21/2019 5:21:25 PM By: Gretta Cool, BSN, RN, CWS, Kim RN, BSN Entered By: Gretta Cool, BSN, RN, CWS, Kim on 10/21/2019  09:35:21 Stanley Marks ([id number]) -------------------------------------------------------------------------------- Pain Assessment Details Patient Name: Stanley Marks Date of Service: 10/21/2019 9:15 AM Medical Record Number: [id number] Patient Account Number: [id number] Date of Birth/Sex: 1941/06/14 (78 y.o. M) Treating RN: Army Melia Primary Care Ilee Randleman: Lamonte Sakai Other Clinician: Referring Jayde Mcallister: Lamonte Sakai Treating Analiah Drum/Extender: Tito Dine in Treatment: 1 Active Problems Location of Pain Severity and Description of Pain Patient Has Paino No Site Locations Pain Management and Medication Current Pain Management: Electronic Signature(s) Signed: 10/22/2019 3:26:34 PM By: Army Melia Entered By: Army Melia on 10/21/2019 09:16:22 Stanley Marks ([id number]) -------------------------------------------------------------------------------- Patient/Caregiver Education Details Patient Name: Stanley Marks Date of Service: 10/21/2019 9:15 AM Medical Record Number: [id number] Patient Account Number: [id number] Date of Birth/Gender: 1941/11/27 (78 y.o. M) Treating RN: Cornell Barman Primary Care Physician: Lamonte Sakai Other Clinician: Referring Physician: Lamonte Sakai Treating Physician/Extender: Tito Dine in Treatment: 1 Education Assessment Education Provided To: Patient Education Topics Provided Venous: Handouts: Controlling Swelling with  Multilayered Compression Wraps Methods: Demonstration, Explain/Verbal Responses: State content correctly Wound/Skin Impairment: Handouts: Caring for Your Ulcer Methods: Demonstration, Explain/Verbal Responses: Return demonstration correctly Electronic Signature(s) Signed: 10/21/2019 5:21:25 PM By: Gretta Cool, BSN, RN, CWS, Kim RN, BSN Entered By: Gretta Cool, BSN, RN, CWS, Kim on 10/21/2019 09:38:42 Stanley Marks  ([id number]) -------------------------------------------------------------------------------- Wound Assessment Details Patient Name: Stanley Marks Date of Service: 10/21/2019 9:15 AM Medical Record Number: [id number] Patient Account Number: [id number] Date of Birth/Sex: Jul 14, 1941 (78 y.o. M) Treating RN: Army Melia Primary Care Maor Meckel: Lamonte Sakai Other Clinician: Referring Sanaz Scarlett: Lamonte Sakai Treating Aundraya Dripps/Extender: Tito Dine in Treatment: 1 Wound Status Wound Number: 3 Primary Venous Leg Ulcer Etiology: Wound Location: Right Lower Leg - Anterior Wound Open Wounding Event: Gradually Appeared Status: Date Acquired: 09/30/2019 Comorbid Cataracts, Chronic Obstructive Pulmonary Weeks Of Treatment: 1 History: Disease (COPD), Arrhythmia, Congestive Heart Clustered Wound: No Failure, Hypertension, Peripheral Venous Disease, Osteomyelitis Photos Wound Measurements Length: (cm) 3.9 Width: (cm) 2 Depth: (cm) 0.1 Area: (cm) 6.126 Volume: (cm) 0.613 % Reduction in Area: 52.6% % Reduction in Volume: 52.6% Epithelialization: None Tunneling: No Undermining: No Wound Description Full Thickness Without Exposed Support Classification: Structures Wound Margin: Flat and Intact Exudate Large Amount: Exudate Type: Serosanguineous Exudate Color: red, brown Foul Odor After Cleansing: No Slough/Fibrino Yes Wound Bed Granulation Amount: Medium (34-66%) Exposed Structure Granulation Quality: Pink, Pale Fascia Exposed: No Necrotic Amount: Medium (34-66%) Fat Layer (Subcutaneous Tissue) Exposed: Yes Necrotic Quality: Eschar, Adherent Slough Tendon Exposed: No Muscle Exposed: No Joint Exposed: No Bone Exposed: No Stanley Marks, Stanley Marks ([id number]) Treatment Notes Wound #3 (Right, Anterior Lower Leg) Notes Hydrafera Blue, gauze, unna to anchor, Kerlix and coban Electronic Signature(s) Signed: 10/22/2019 3:26:34 PM By: Army Melia Entered By: Army Melia on  10/21/2019 09:24:44 Stanley Marks ([id number]) -------------------------------------------------------------------------------- Vitals Details Patient Name: Stanley Marks Date of Service: 10/21/2019 9:15 AM Medical Record Number: [id number] Patient Account Number: [id number] Date of Birth/Sex: [birth year] (78 y.o. M) Treating RN: Army Melia Primary Care Kenniyah Sasaki: Lamonte Sakai Other Clinician: Referring Canyon Lohr: Lamonte Sakai Treating Kriya Westra/Extender: Tito Dine in Treatment: 1 Vital Signs Time Taken: 09:16 Temperature (F): 98.3 Height (in): 74 Pulse (bpm): 68 Weight (lbs): 300 Respiratory Rate (breaths/min): 16 Body Mass Index (BMI): 38.5 Blood Pressure (mmHg): 146/81 Reference Range: 80 - 120 mg / dl Electronic Signature(s) Signed: 10/22/2019 3:26:34 PM By: Army Melia Entered By: Army Melia on 10/21/2019 09:23:50

## 2019-10-22 NOTE — Progress Notes (Signed)
[name], [name] ([id number]) Visit Report for 10/21/2019 HPI Details Patient Name: Stanley Marks, Stanley Marks Date of Service: 10/21/2019 9:15 AM Medical Record Number: [id number] Patient Account Number: [id number] Date of Birth/Sex: 08-25-1941 (78 y.o. M) Treating RN: Cornell Barman Primary Care Provider: Lamonte Sakai Other Clinician: Referring Provider: Lamonte Sakai Treating Provider/Extender: Tito Dine in Treatment: 1 History of Present Illness HPI Description: Pleasant 78 year old with history of chronic venous insufficiency and hypertension. No History of diabetes or peripheral arterial disease. He says that he stopped taking his "fluid pill" so that he wouldn't have to urinate as often. He subsequently developed bilateral lower extremity swelling and left calf ulcerations in late August 2016. Status post bilateral lower extremity endovenous ablation over 5 years ago in [place]. He does not regularly wear compression stockings. Denies any history of DVT. He is ambulating per his baseline. No claudication or rest pain. Tolerated 3 layer compression bandage with Prisma with significant improvement. Recently declined compression bandages and has been using a Tubigrip for edema control. He returns to clinic for follow-up and is without complaints. No significant pain. No fever or chills. No drainage. READMISSION 10/14/2019 This is a 78 year old man who is in this clinic for years ago with a venous insufficiency wound on the left leg. He was discharged with compression stockings but he does not wear them. He states he developed a blister on the right anterior tibial area in October. He was in the ER on 10/26 after falling and noted to have a wound on his right leg/shin although the patient states the blister was already present. He was felt possibly to be mildly dehydrated he declined IV fluid. X-ray did not show any osseous abnormalities however he was noted to have severe right knee  osteoarthritis. The patient has known chronic venous insufficiency. He states he had ablations in his bilateral lower legs many years ago. This seems well verified from the history noted above. He is not a diabetic and does not have known PAD. Past medical history; no diabetes, atrial fibrillation on Xarelto, history of basal cell cancer, COPD continued smoker, hypertension, peripheral vascular disease. ABIs in our clinic were 1.36 on the right and 1.24 on the left 11/11; patient with a venous insufficiency wound on the right anterior lower leg. Admitted to clinic last week. He complains bitterly about the 3 layer compression we put on him stating it was uncomfortable he could not sleep. He has previously not tolerated stockings very well as well. His ABIs in this clinic were noncompressible however his dorsalis pedis and posterior tibial pulses are easily palpable. He has decent edema control. We used Hydrofera Blue which apparently stuck to the wound. There are good improvements in wound dimensions Electronic Signature(s) Signed: 10/21/2019 5:41:49 PM By: Linton Ham MD Entered By: Linton Ham on 10/21/2019 09:47:48 Stanley Marks ([id number]) -------------------------------------------------------------------------------- Physical Exam Details Patient Name: Stanley Marks Date of Service: 10/21/2019 9:15 AM Medical Record Number: [id number] Patient Account Number: [id number] Date of Birth/Sex: 09-Sep-1941 (78 y.o. M) Treating RN: Cornell Barman Primary Care Provider: Lamonte Sakai Other Clinician: Referring Provider: Lamonte Sakai Treating Provider/Extender: Tito Dine in Treatment: 1 Constitutional Patient is hypertensive.. Pulse regular and within target range for patient.Marland Kitchen Respirations regular, non-labored and within target range.. Temperature is normal and within the target range for the patient.Marland Kitchen appears in no distress. Eyes Conjunctivae clear. No  discharge. Respiratory Respiratory effort is easy and symmetric bilaterally. Rate is normal at rest and on room air.. Cardiovascular Pedal pulses palpable and strong  bilaterally.. Integumentary (Hair, Skin) No erythema around the wound no tenderness. Severe bilateral hemosiderin deposition. Psychiatric No evidence of depression, anxiety, or agitation. Calm, cooperative, and communicative. Appropriate interactions and affect.. Notes Wound exam; area in question is on the right anterior tibia. Superficial smaller no debridement is required. The dimensions are better today. His edema control is excellent. He has severe chronic hemosiderin deposition. No evidence of infection Electronic Signature(s) Signed: 10/21/2019 5:41:49 PM By: Linton Ham MD Entered By: Linton Ham on 10/21/2019 09:49:01 Stanley Marks ([id number]) -------------------------------------------------------------------------------- Physician Orders Details Patient Name: Stanley Marks Date of Service: 10/21/2019 9:15 AM Medical Record Number: [id number] Patient Account Number: [id number] Date of Birth/Sex: Dec 13, 1940 (78 y.o. M) Treating RN: Cornell Barman Primary Care Provider: Lamonte Sakai Other Clinician: Referring Provider: Lamonte Sakai Treating Provider/Extender: Tito Dine in Treatment: 1 Verbal / Phone Orders: No Diagnosis Coding Wound Cleansing Wound #3 Right,Anterior Lower Leg o Cleanse wound with mild soap and water o May shower with protection. - DO not get dressing wet o No tub bath. Anesthetic (add to Medication List) Wound #3 Right,Anterior Lower Leg o Topical Lidocaine 4% cream applied to wound bed prior to debridement (In Clinic Only). Skin Barriers/Peri-Wound Care Wound #3 Right,Anterior Lower Leg o Barrier cream Primary Wound Dressing Wound #3 Right,Anterior Lower Leg o Mepitel One Contact layer o Hydrafera Blue Ready Transfer Secondary Dressing Wound #3  Right,Anterior Lower Leg o Dry Gauze Dressing Change Frequency Wound #3 Right,Anterior Lower Leg o Change dressing every week o Other: - additional changes if needed Follow-up Appointments Wound #3 Right,Anterior Lower Leg o Return Appointment in 1 week. o Nurse Visit as needed - Patient to call if wrap slides down leg. Edema Control Wound #3 Right,Anterior Lower Leg o Kerlix and Coban - Right Lower Extremity - Unna to anchor Additional Orders / Instructions Wound #3 Right,Anterior Lower Leg o Stop Smoking o Increase protein intake. JOAB, POWE ([id number]) Electronic Signature(s) Signed: 10/21/2019 5:21:25 PM By: Gretta Cool, BSN, RN, CWS, Kim RN, BSN Signed: 10/21/2019 5:41:49 PM By: Linton Ham MD Entered By: Gretta Cool, BSN, RN, CWS, Kim on 10/21/2019 09:40:38 TILLMAN, INSERRA ([id number]) -------------------------------------------------------------------------------- Problem List Details Patient Name: Stanley Marks Date of Service: 10/21/2019 9:15 AM Medical Record Number: [id number] Patient Account Number: [id number] Date of Birth/Sex: Feb 22, 1941 (78 y.o. 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 10/14/2019 No Yes limited to breakdown of skin I87.331 Chronic venous hypertension (idiopathic) with ulcer and 10/14/2019 No Yes inflammation of right lower extremity Inactive Problems Resolved Problems Electronic Signature(s) Signed: 10/21/2019 5:41:49 PM By: Linton Ham MD Entered By: Linton Ham on 10/21/2019 09:43:42 Stanley Marks ([id number]) -------------------------------------------------------------------------------- Progress Note Details Patient Name:  Stanley Marks Date of Service: 10/21/2019 9:15 AM Medical Record Number: [id number] Patient Account Number: [id number] Date of Birth/Sex: [birth year] (78 y.o. M) Treating RN: Cornell Barman Primary Care Provider: Lamonte Sakai Other Clinician: Referring Provider: Lamonte Sakai Treating Provider/Extender: Tito Dine in Treatment: 1 Subjective History of Present Illness (HPI) Pleasant 78 year old with history of chronic venous insufficiency and hypertension. No History of diabetes or peripheral arterial disease. He says that he stopped taking his "fluid pill" so that he wouldn't have to urinate as often. He subsequently developed bilateral lower extremity swelling and left calf ulcerations in late August 2016. Status post  bilateral lower extremity endovenous ablation over 5 years ago in [place]. He does not regularly wear compression stockings. Denies any history of DVT. He is ambulating per his baseline. No claudication or rest pain. Tolerated 3 layer compression bandage with Prisma with significant improvement. Recently declined compression bandages and has been using a Tubigrip for edema control. He returns to clinic for follow-up and is without complaints. No significant pain. No fever or chills. No drainage. READMISSION 10/14/2019 This is a 78 year old man who is in this clinic for years ago with a venous insufficiency wound on the left leg. He was discharged with compression stockings but he does not wear them. He states he developed a blister on the right anterior tibial area in October. He was in the ER on 10/26 after falling and noted to have a wound on his right leg/shin although the patient states the blister was already present. He was felt possibly to be mildly dehydrated he declined IV fluid. X-ray did not show any osseous abnormalities however he was noted to have severe right knee osteoarthritis. The patient has known chronic venous insufficiency. He states he had  ablations in his bilateral lower legs many years ago. This seems well verified from the history noted above. He is not a diabetic and does not have known PAD. Past medical history; no diabetes, atrial fibrillation on Xarelto, history of basal cell cancer, COPD continued smoker, hypertension, peripheral vascular disease. ABIs in our clinic were 1.36 on the right and 1.24 on the left 11/11; patient with a venous insufficiency wound on the right anterior lower leg. Admitted to clinic last week. He complains bitterly about the 3 layer compression we put on him stating it was uncomfortable he could not sleep. He has previously not tolerated stockings very well as well. His ABIs in this clinic were noncompressible however his dorsalis pedis and posterior tibial pulses are easily palpable. He has decent edema control. We used Hydrofera Blue which apparently stuck to the wound. There are good improvements in wound dimensions Objective Constitutional Patient is hypertensive.. Pulse regular and within target range for patient.Marland Kitchen Respirations regular, non-labored and within target range.. Temperature is normal and within the target range for the patient.Marland Kitchen appears in no distress. OMARRI, NGAN ([id number]) Vitals Time Taken: 9:16 AM, Height: 74 in, Weight: 300 lbs, BMI: 38.5, Temperature: 98.3 F, Pulse: 68 bpm, Respiratory Rate: 16 breaths/min, Blood Pressure: 146/81 mmHg. Eyes Conjunctivae clear. No discharge. Respiratory Respiratory effort is easy and symmetric bilaterally. Rate is normal at rest and on room air.. Cardiovascular Pedal pulses palpable and strong bilaterally.Marland Kitchen Psychiatric No evidence of depression, anxiety, or agitation. Calm, cooperative, and communicative. Appropriate interactions and affect.. General Notes: Wound exam; area in question is on the right anterior tibia. Superficial smaller no debridement is required. The dimensions are better today. His edema control is excellent. He  has severe chronic hemosiderin deposition. No evidence of infection Integumentary (Hair, Skin) No erythema around the wound no tenderness. Severe bilateral hemosiderin deposition. Wound #3 status is Open. Original cause of wound was Gradually Appeared. The wound is located on the Right,Anterior Lower Leg. The wound measures 3.9cm length x 2cm width x 0.1cm depth; 6.126cm^2 area and 0.613cm^3 volume. There is Fat Layer (Subcutaneous Tissue) Exposed exposed. There is no tunneling or undermining noted. There is a large amount of serosanguineous drainage noted. The wound margin is flat and intact. There is medium (34-66%) pink, pale granulation within the wound bed. There is a medium (34-66%) amount of necrotic tissue within  the wound bed including Eschar and Adherent Slough. 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 - DO not get dressing wet No tub bath. Anesthetic (add to Medication List): Wound #3 Right,Anterior Lower Leg: Topical Lidocaine 4% cream applied to wound bed prior to debridement (In Clinic Only). TIWAN, MOSCHELLA ([id number]) Skin Barriers/Peri-Wound Care: Wound #3 Right,Anterior Lower Leg: Barrier cream Primary Wound Dressing: Wound #3 Right,Anterior Lower Leg: Mepitel One Contact layer Hydrafera Blue Ready Transfer Secondary Dressing: Wound #3 Right,Anterior Lower Leg: Dry Gauze Dressing Change Frequency: Wound #3 Right,Anterior Lower Leg: Change dressing every week Other: - additional changes if needed Follow-up Appointments: Wound #3 Right,Anterior Lower Leg: Return Appointment in 1 week. Nurse Visit as needed - Patient to call if wrap slides down leg. Edema  Control: Wound #3 Right,Anterior Lower Leg: Kerlix and Coban - Right Lower Extremity - Unna to anchor Additional Orders / Instructions: Wound #3 Right,Anterior Lower Leg: Stop Smoking Increase protein intake. 1. Hydrofera Blue with a contact layer of Mepitel 2. I managed to talk him into Kerlix and Coban we will see how a tolerates this. 3. I am doubtful he will wear below-knee compression stockings reminding me that he has not had a wound in 4 years Electronic Signature(s) Signed: 10/21/2019 5:41:49 PM By: Linton Ham MD Entered By: Linton Ham on 10/21/2019 09:49:50 Stanley Marks ([id number]) -------------------------------------------------------------------------------- SuperBill Details Patient Name: Stanley Marks Date of Service: 10/21/2019 Medical Record Number: [id number] Patient Account Number: [id number] Date of Birth/Sex: [birth year] (78 y.o. M) Treating RN: Cornell Barman Primary Care Provider: Lamonte Sakai Other Clinician: Referring Provider: Lamonte Sakai Treating Provider/Extender: Tito Dine in Treatment: 1 Diagnosis Coding ICD-10 Codes Code Description L97.811 Non-pressure chronic ulcer of other part of right lower leg limited to breakdown of skin I87.331 Chronic venous hypertension (idiopathic) with ulcer and inflammation of right lower extremity Facility Procedures CPT4 Code: [id number] Description: 99213 - WOUND CARE VISIT-LEV 3 EST PT Modifier: Quantity: 1 Physician Procedures CPT4: Description Modifier Quantity Code [id number] - WC PHYS LEVEL 3 - EST PT 1 ICD-10 Diagnosis Description L97.811 Non-pressure chronic ulcer of other part of right lower leg limited to breakdown of skin I87.331 Chronic venous hypertension  (idiopathic) with ulcer and inflammation of right lower extremity Electronic Signature(s) Signed: 10/21/2019 5:41:49 PM By: Linton Ham MD Entered By: Linton Ham on 10/21/2019 09:50:07

## 2019-10-28 ENCOUNTER — Other Ambulatory Visit: Payer: Self-pay

## 2019-10-28 ENCOUNTER — Encounter: Payer: Medicare Other | Admitting: Internal Medicine

## 2019-10-28 DIAGNOSIS — L97811 Non-pressure chronic ulcer of other part of right lower leg limited to breakdown of skin: Secondary | ICD-10-CM | POA: Diagnosis not present

## 2019-10-29 NOTE — Progress Notes (Signed)
DAIL, BALIUS ([id number]) Visit Report for 10/28/2019 Arrival Information Details Patient Name: Stanley Marks, Stanley Marks Date of Service: 10/28/2019 9:15 AM Medical Record Number: [id number] Patient Account Number: [id number] Date of Birth/Sex: 1941/02/16 (78 y.o. M) Treating RN: Army Melia Primary Care Taliah Porche: Lamonte Sakai Other Clinician: Referring Clarece Drzewiecki: Lamonte Sakai Treating Kyna Blahnik/Extender: Tito Dine in Treatment: 2 Visit Information History Since Last Visit Added or deleted any medications: No Patient Arrived: Ambulatory Any new allergies or adverse reactions: No Arrival Time: 09:13 Had a fall or experienced change in No Accompanied By: self activities of daily living that may affect Transfer Assistance: None risk of falls: Patient Identification Verified: Yes Signs or symptoms of abuse/neglect since last visito No Patient Has Alerts: Yes Hospitalized since last visit: No Patient Alerts: Patient on Blood Thinner Has Dressing in Place as Prescribed: Yes Xarelto Pain Present Now: No Electronic Signature(s) Signed: 10/28/2019 4:09:40 PM By: Army Melia Entered By: Army Melia on 10/28/2019 09:13:59 Stanley Marks ([id number]) -------------------------------------------------------------------------------- Encounter Discharge Information Details Patient Name: Stanley Marks Date of Service: 10/28/2019 9:15 AM Medical Record Number: [id number] Patient Account Number: [id number] Date of Birth/Sex: 1941-05-24 (78 y.o. M) Treating RN: Army Melia Primary Care Media Pizzini: Lamonte Sakai Other Clinician: Referring Misael Mcgaha: Lamonte Sakai Treating Teriann Livingood/Extender: Tito Dine in Treatment: 2 Encounter Discharge Information Items Post Procedure Vitals Discharge Condition: Stable Temperature (F): 98.2 Ambulatory Status: Ambulatory Pulse (bpm): 90 Discharge Destination: Home Respiratory Rate (breaths/min): 16 Transportation: Private Auto Blood  Pressure (mmHg): 143/90 Accompanied By: self Schedule Follow-up Appointment: Yes Clinical Summary of Care: Electronic Signature(s) Signed: 10/28/2019 4:09:40 PM By: Army Melia Entered By: Army Melia on 10/28/2019 10:10:32 Stanley Marks ([id number]) -------------------------------------------------------------------------------- Lower Extremity Assessment Details Patient Name: Stanley Marks Date of Service: 10/28/2019 9:15 AM Medical Record Number: [id number] Patient Account Number: [id number] Date of Birth/Sex: 03-26-1941 (78 y.o. M) Treating RN: Army Melia Primary Care Lyrika Souders: Lamonte Sakai Other Clinician: Referring Shannan Slinker: Lamonte Sakai Treating Deloros Beretta/Extender: Tito Dine in Treatment: 2 Edema Assessment Assessed: [Left: No] [Right: No] Edema: [Left: N] [Right: o] Calf Left: Right: Point of Measurement: 36 cm From Medial Instep cm 40 cm Ankle Left: Right: Point of Measurement: 120 cm From Medial Instep cm 24 cm Vascular Assessment Pulses: Dorsalis Pedis Palpable: [Right:Yes] Electronic Signature(s) Signed: 10/28/2019 9:37:15 AM By: Army Melia Entered By: Army Melia on 10/28/2019 09:37:15 Stanley Marks ([id number]) -------------------------------------------------------------------------------- Multi Wound Chart Details Patient Name: Stanley Marks Date of Service: 10/28/2019 9:15 AM Medical Record Number: [id number] Patient Account Number: [id number] Date of Birth/Sex: May 02, 1941 (78 y.o. M) Treating RN: Army Melia Primary Care Bowdy Bair: Lamonte Sakai Other Clinician: Referring Everson Mott: Lamonte Sakai Treating Brianna Esson/Extender: Tito Dine in Treatment: 2 Vital Signs Height(in): 74 Pulse(bpm): 49 Weight(lbs): 300 Blood Pressure(mmHg): 143/90 Body Mass Index(BMI): 39 Temperature(F): 98.2 Respiratory Rate 16 (breaths/min): Photos: Wound Location: Right Lower Leg - Anterior Right Forearm - Medial Right Forearm -  Lateral Wounding Event: Gradually Appeared Trauma Trauma Primary Etiology: Venous Leg Ulcer Skin Tear Skin Tear Comorbid History: Cataracts, Chronic Obstructive Cataracts, Chronic Obstructive Cataracts, Chronic Obstructive Pulmonary Disease (COPD), Pulmonary Disease (COPD), Pulmonary Disease (COPD), Arrhythmia, Congestive Heart Arrhythmia, Congestive Heart Arrhythmia, Congestive Heart Failure, Hypertension, Failure, Hypertension, Failure, Hypertension, Peripheral Venous Disease, Peripheral Venous Disease, Peripheral Venous Disease, Osteomyelitis Osteomyelitis Osteomyelitis Date Acquired: 09/30/2019 10/27/2019 12/26/2018 Weeks of Treatment: 2 0 0 Wound Status: Open Open Open Measurements L x W x D 3x2x0.1 4.5x1x0.1 1x2x0.1 (cm) Area (cm) : 4.712 3.534 1.571 Volume (cm) : 0.471 0.353 0.157 % Reduction in  Area: 63.50% 0.00% 0.00% % Reduction in Volume: 63.50% 0.00% 0.00% Classification: Full Thickness Without Partial Thickness Partial Thickness Exposed Support Structures Exudate Amount: Large Medium Medium Exudate Type: Serosanguineous Serosanguineous Serosanguineous Exudate Color: red, brown red, brown red, brown Wound Margin: Flat and Intact N/A N/A Granulation Amount: Medium (34-66%) Medium (34-66%) Medium (34-66%) Granulation Quality: Pink, Pale Red Red Necrotic Amount: Medium (34-66%) Medium (34-66%) Medium (34-66%) Necrotic Tissue: Eschar, Adherent [place] Exposed Structures: Stanley Marks, Stanley Marks ([id number]) Fat Layer (Subcutaneous Fat Layer (Subcutaneous Fat Layer (Subcutaneous Tissue) Exposed: Yes Tissue) Exposed: Yes Tissue) Exposed: Yes Fascia: No Fascia: No Fascia: No Tendon: No Tendon: No Tendon: No Muscle: No Muscle: No Muscle: No Joint: No Joint: No Joint: No Bone: No Bone: No Bone: No Epithelialization: None None None Debridement: N/A N/A Debridement - Selective/Open Wound Pre-procedure N/A N/A 09:48 Verification/Time Out  Taken: Pain Control: N/A N/A Lidocaine Level: N/A N/A Skin/Epidermis Debridement Area (sq cm): N/A N/A 1 Instrument: N/A N/A Forceps Bleeding: N/A N/A Minimum Hemostasis Achieved: N/A N/A Pressure Debridement Treatment N/A N/A Procedure was tolerated well Response: Post Debridement N/A N/A 1x2x0.1 Measurements L x W x D (cm) Post Debridement Volume: N/A N/A 0.157 (cm) Procedures Performed: N/A N/A Debridement Treatment Notes Wound #3 (Right, Anterior Lower Leg) Notes Hydrafera Blue, gauze, unna to anchor, Kerlix and coban to leg; arm - xeroform, abd, conform and netting Wound #4 (Right, Medial Forearm) Notes Hydrafera Blue, gauze, unna to anchor, Kerlix and coban to leg; arm - xeroform, abd, conform and netting Wound #5 (Right, Lateral Forearm) Notes Hydrafera Blue, gauze, unna to anchor, Kerlix and coban to leg; arm - xeroform, abd, conform and netting Electronic Signature(s) Signed: 10/28/2019 5:09:09 PM By: Linton Ham MD Entered By: Linton Ham on 10/28/2019 10:15:54 Stanley Marks ([id number]) -------------------------------------------------------------------------------- Multi-Disciplinary Care Plan Details Patient Name: Stanley Marks Date of Service: 10/28/2019 9:15 AM Medical Record Number: [id number] Patient Account Number: [id number] Date of Birth/Sex: [birth year] (78 y.o. M) Treating RN: Army Melia Primary Care Kenetra Hildenbrand: Lamonte Sakai Other Clinician: Referring Arika Mainer: Lamonte Sakai Treating Ramses Klecka/Extender: Tito Dine in Treatment: 2 Active Inactive Abuse / Safety / Falls / Self Care Management Nursing Diagnoses: Impaired physical mobility Potential for falls Goals: Patient will remain injury free related to falls Date Initiated: 10/14/2019 Target Resolution Date: 10/21/2019 Goal Status: Active Patient/caregiver will verbalize understanding of skin care regimen Date Initiated: 10/14/2019 Target Resolution Date: 10/21/2019 Goal  Status: Active Interventions: Assess fall risk on admission and as needed Notes: Orientation to the Wound Care Program Nursing Diagnoses: Knowledge deficit related to the wound healing center program Goals: Patient/caregiver will verbalize understanding of the [facility] Date Initiated: 10/14/2019 Target Resolution Date: 10/15/2019 Goal Status: Active Interventions: Provide education on orientation to the wound center Notes: Venous Leg Ulcer Nursing Diagnoses: Actual venous Insuffiency (use after diagnosis is confirmed) Goals: Patient/caregiver will verbalize understanding of disease process and disease management Date Initiated: 10/14/2019 Target Resolution Date: 10/21/2019 Stanley Marks, Stanley Marks ([id number]) Goal Status: Active Interventions: Assess peripheral edema status every visit. Treatment Activities: Therapeutic compression applied : 10/14/2019 Notes: Wound/Skin Impairment Nursing Diagnoses: Impaired tissue integrity Goals: Ulcer/skin breakdown will have a volume reduction of 30% by week 4 Date Initiated: 10/14/2019 Target Resolution Date: 11/11/2019 Goal Status: Active Interventions: Assess ulceration(s) every visit Treatment Activities: Skin care regimen initiated : 10/14/2019 Topical wound management initiated : 10/14/2019 Notes: Electronic Signature(s) Signed: 10/28/2019 4:09:40 PM By: Army Melia Entered By: Army Melia on 10/28/2019 09:47:42 Stanley Marks ([id number]) -------------------------------------------------------------------------------- Pain Assessment  Details Patient Name: Stanley Marks, Stanley Marks Date of Service: 10/28/2019 9:15 AM Medical Record Number: [id number] Patient Account Number: [id number] Date of Birth/Sex: 1941-07-08 (78 y.o. M) Treating RN: Army Melia Primary Care Leonardo Plaia: Lamonte Sakai Other Clinician: Referring Marque Rademaker: Lamonte Sakai Treating Naliya Gish/Extender: Tito Dine in Treatment: 2 Active  Problems Location of Pain Severity and Description of Pain Patient Has Paino No Site Locations Pain Management and Medication Current Pain Management: Electronic Signature(s) Signed: 10/28/2019 4:09:40 PM By: Army Melia Entered By: Army Melia on 10/28/2019 09:14:04 Stanley Marks ([id number]) -------------------------------------------------------------------------------- Patient/Caregiver Education Details Patient Name: Stanley Marks Date of Service: 10/28/2019 9:15 AM Medical Record Number: [id number] Patient Account Number: [id number] Date of Birth/Gender: [birth year] (78 y.o. M) Treating RN: Army Melia Primary Care Physician: Lamonte Sakai Other Clinician: Referring Physician: Lamonte Sakai Treating Physician/Extender: Tito Dine in Treatment: 2 Education Assessment Education Provided To: Patient Education Topics Provided Wound/Skin Impairment: Handouts: Caring for Your Ulcer Methods: Demonstration, Explain/Verbal Responses: State content correctly Electronic Signature(s) Signed: 10/28/2019 4:09:40 PM By: Army Melia Entered By: Army Melia on 10/28/2019 09:53:02 Stanley Marks ([id number]) -------------------------------------------------------------------------------- Wound Assessment Details Patient Name: Stanley Marks Date of Service: 10/28/2019 9:15 AM Medical Record Number: [id number] Patient Account Number: [id number] Date of Birth/Sex: November 01, 1941 (78 y.o. M) Treating RN: Army Melia Primary Care Blen Ransome: Lamonte Sakai Other Clinician: Referring Jameya Pontiff: Lamonte Sakai Treating Takashi Korol/Extender: Tito Dine in Treatment: 2 Wound Status Wound Number: 3 Primary Venous Leg Ulcer Etiology: Wound Location: Right Lower Leg - Anterior Wound Open Wounding Event: Gradually Appeared Status: Date Acquired: 09/30/2019 Comorbid Cataracts, Chronic Obstructive Pulmonary Weeks Of Treatment: 2 History: Disease (COPD), Arrhythmia, Congestive  Heart Clustered Wound: No Failure, Hypertension, Peripheral Venous Disease, Osteomyelitis Photos Wound Measurements Length: (cm) 3 Width: (cm) 2 Depth: (cm) 0.1 Area: (cm) 4.712 Volume: (cm) 0.471 % Reduction in Area: 63.5% % Reduction in Volume: 63.5% Epithelialization: None Wound Description Full Thickness Without Exposed Support Classification: Structures Wound Margin: Flat and Intact Exudate Large Amount: Exudate Type: Serosanguineous Exudate Color: red, brown Foul Odor After Cleansing: No Slough/Fibrino Yes Wound Bed Granulation Amount: Medium (34-66%) Exposed Structure Granulation Quality: Pink, Pale Fascia Exposed: No Necrotic Amount: Medium (34-66%) Fat Layer (Subcutaneous Tissue) Exposed: Yes Necrotic Quality: Eschar, Adherent Slough Tendon Exposed: No Muscle Exposed: No Joint Exposed: No Bone Exposed: No Dewilde, Rolan Lipa ([id number]) Treatment Notes Wound #3 (Right, Anterior Lower Leg) Notes Hydrafera Blue, gauze, unna to anchor, Kerlix and coban to leg; arm - xeroform, abd, conform and netting Electronic Signature(s) Signed: 10/28/2019 9:36:07 AM By: Army Melia Entered By: Army Melia on 10/28/2019 09:36:06 Stanley Marks ([id number]) -------------------------------------------------------------------------------- Wound Assessment Details Patient Name: Stanley Marks Date of Service: 10/28/2019 9:15 AM Medical Record Number: [id number] Patient Account Number: [id number] Date of Birth/Sex: 06/20/1941 (78 y.o. M) Treating RN: Army Melia Primary Care Janeese Mcgloin: Lamonte Sakai Other Clinician: Referring Jajuan Skoog: Lamonte Sakai Treating Chaneka Trefz/Extender: Tito Dine in Treatment: 2 Wound Status Wound Number: 4 Primary Skin Tear Etiology: Wound Location: Right Forearm - Medial Wound Open Wounding Event: Trauma Status: Date Acquired: 10/27/2019 Comorbid Cataracts, Chronic Obstructive Pulmonary Weeks Of Treatment: 0 History: Disease  (COPD), Arrhythmia, Congestive Heart Clustered Wound: No Failure, Hypertension, Peripheral Venous Disease, Osteomyelitis Photos Wound Measurements Length: (cm) 4.5 % Reduction i Width: (cm) 1 % Reduction i Depth: (cm) 0.1 Epithelializa Area: (cm) 3.534 Tunneling: Volume: (cm) 0.353 Undermining: n Area: 0% n Volume: 0% tion: None No No Wound Description Classification: Partial Thickness Foul Odor Af Exudate Amount: Medium Slough/Fibri Exudate Type: Serosanguineous Exudate  Color: red, brown ter Cleansing: No no Yes Wound Bed Granulation Amount: Medium (34-66%) Exposed Structure Granulation Quality: Red Fascia Exposed: No Necrotic Amount: Medium (34-66%) Fat Layer (Subcutaneous Tissue) Exposed: Yes Necrotic Quality: Adherent Slough Tendon Exposed: No Muscle Exposed: No Joint Exposed: No Bone Exposed: No Treatment Notes Stanley Marks, Stanley Marks ([id number]) Wound #4 (Right, Medial Forearm) Notes Hydrafera Blue, gauze, unna to anchor, Kerlix and coban to leg; arm - xeroform, abd, conform and netting Electronic Signature(s) Signed: 10/28/2019 9:36:32 AM By: Army Melia Entered By: Army Melia on 10/28/2019 09:36:31 Stanley Marks ([id number]) -------------------------------------------------------------------------------- Wound Assessment Details Patient Name: Stanley Marks Date of Service: 10/28/2019 9:15 AM Medical Record Number: [id number] Patient Account Number: [id number] Date of Birth/Sex: 1941-10-14 (78 y.o. M) Treating RN: Army Melia Primary Care Yovanna Cogan: Lamonte Sakai Other Clinician: Referring Orvetta Danielski: Lamonte Sakai Treating Sunni Richardson/Extender: Tito Dine in Treatment: 2 Wound Status Wound Number: 5 Primary Skin Tear Etiology: Wound Location: Right Forearm - Lateral Wound Open Wounding Event: Trauma Status: Date Acquired: 12/26/2018 Comorbid Cataracts, Chronic Obstructive Pulmonary Weeks Of Treatment: 0 History: Disease (COPD), Arrhythmia,  Congestive Heart Clustered Wound: No Failure, Hypertension, Peripheral Venous Disease, Osteomyelitis Photos Wound Measurements Length: (cm) 1 % Reduction Width: (cm) 2 % Reduction Depth: (cm) 0.1 Epithelializ Area: (cm) 1.571 Tunneling: Volume: (cm) 0.157 Undermining in Area: 0% in Volume: 0% ation: None No : No Wound Description Classification: Partial Thickness Foul Odor A Exudate Amount: Medium Slough/Fibr Exudate Type: Serosanguineous Exudate Color: red, brown fter Cleansing: No ino Yes Wound Bed Granulation Amount: Medium (34-66%) Exposed Structure Granulation Quality: Red Fascia Exposed: No Necrotic Amount: Medium (34-66%) Fat Layer (Subcutaneous Tissue) Exposed: Yes Necrotic Quality: Adherent Slough Tendon Exposed: No Muscle Exposed: No Joint Exposed: No Bone Exposed: No Treatment Notes Stanley Marks, Stanley Marks ([id number]) Wound #5 (Right, Lateral Forearm) Notes Hydrafera Blue, gauze, unna to anchor, Kerlix and coban to leg; arm - xeroform, abd, conform and netting Electronic Signature(s) Signed: 10/28/2019 9:36:51 AM By: Army Melia Entered By: Army Melia on 10/28/2019 09:36:51 Stanley Marks ([id number]) -------------------------------------------------------------------------------- Vitals Details Patient Name: Stanley Marks Date of Service: 10/28/2019 9:15 AM Medical Record Number: [id number] Patient Account Number: [id number] Date of Birth/Sex: March 12, 1941 (78 y.o. M) Treating RN: Army Melia Primary Care Michell Kader: Lamonte Sakai Other Clinician: Referring Lynnix Schoneman: Lamonte Sakai Treating Sohan Potvin/Extender: Tito Dine in Treatment: 2 Vital Signs Time Taken: 09:14 Temperature (F): 98.2 Height (in): 74 Pulse (bpm): 90 Weight (lbs): 300 Respiratory Rate (breaths/min): 16 Body Mass Index (BMI): 38.5 Blood Pressure (mmHg): 143/90 Reference Range: 80 - 120 mg / dl Electronic Signature(s) Signed: 10/28/2019 4:09:40 PM By: Army Melia Entered By: Army Melia on 10/28/2019 09:18:03

## 2019-10-29 NOTE — Progress Notes (Signed)
Stanley Marks, Stanley Marks ([id number]) Visit Report for 10/28/2019 Debridement Details Patient Name: Stanley Marks, Stanley Marks Date of Service: 10/28/2019 9:15 AM Medical Record Number: [id number] Patient Account Number: [id number] Date of Birth/Sex: 1941-02-05 (78 y.o. M) Treating RN: Cornell Barman Primary Care Provider: Lamonte Sakai Other Clinician: Referring Provider: Lamonte Sakai Treating Provider/Extender: Tito Dine in Treatment: 2 Debridement Performed for Wound #5 Right,Lateral Forearm Assessment: Performed By: Physician Ricard Dillon, MD Debridement Type: Debridement Level of Consciousness (Pre- Awake and Alert procedure): Pre-procedure Verification/Time Yes - 09:48 Out Taken: Start Time: 09:48 Pain Control: Lidocaine Total Area Debrided (L x W): 1 (cm) x 1 (cm) = 1 (cm) Tissue and other material Non-Viable, Skin: Dermis , Skin: Epidermis debrided: Level: Skin/Epidermis Debridement Description: Selective/Open Wound Instrument: Forceps Bleeding: Minimum Hemostasis Achieved: Pressure End Time: 09:49 Response to Treatment: Procedure was tolerated well Level of Consciousness Awake and Alert (Post-procedure): Post Debridement Measurements of Total Wound Length: (cm) 1 Width: (cm) 2 Depth: (cm) 0.1 Volume: (cm) 0.157 Character of Wound/Ulcer Post Debridement: Requires Further Debridement Post Procedure Diagnosis Same as Pre-procedure Electronic Signature(s) Signed: 10/28/2019 5:09:09 PM By: Linton Ham MD Signed: 10/29/2019 1:15:06 PM By: Gretta Cool, BSN, RN, CWS, Kim RN, BSN Entered By: Linton Ham on 10/28/2019 10:16:06 Stanley Marks ([id number]) -------------------------------------------------------------------------------- HPI Details Patient Name: Stanley Marks Date of Service: 10/28/2019 9:15 AM Medical Record Number: [id number] Patient Account Number: [id number] Date of Birth/Sex: 01/28/1941 (78 y.o. M) Treating RN: Cornell Barman Primary Care Provider:  Lamonte Sakai Other Clinician: Referring Provider: Lamonte Sakai Treating Provider/Extender: Tito Dine in Treatment: 2 History of Present Illness HPI Description: Pleasant 78 year old with history of chronic venous insufficiency and hypertension. No History of diabetes or peripheral arterial disease. He says that he stopped taking his "fluid pill" so that he wouldn't have to urinate as often. He subsequently developed bilateral lower extremity swelling and left calf ulcerations in late August 2016. Status post bilateral lower extremity endovenous ablation over 5 years ago in [place]. He does not regularly wear compression stockings. Denies any history of DVT. He is ambulating per his baseline. No claudication or rest pain. Tolerated 3 layer compression bandage with Prisma with significant improvement. Recently declined compression bandages and has been using a Tubigrip for edema control. He returns to clinic for follow-up and is without complaints. No significant pain. No fever or chills. No drainage. READMISSION 10/14/2019 This is a 78 year old man who is in this clinic for years ago with a venous insufficiency wound on the left leg. He was discharged with compression stockings but he does not wear them. He states he developed a blister on the right anterior tibial area in October. He was in the ER on 10/26 after falling and noted to have a wound on his right leg/shin although the patient states the blister was already present. He was felt possibly to be mildly dehydrated he declined IV fluid. X-ray did not show any osseous abnormalities however he was noted to have severe right knee osteoarthritis. The patient has known chronic venous insufficiency. He states he had ablations in his bilateral lower legs many years ago. This seems well verified from the history noted above. He is not a diabetic and does not have known PAD. Past medical history; no diabetes, atrial fibrillation on  Xarelto, history of basal cell cancer, COPD continued smoker, hypertension, peripheral vascular disease. ABIs in our clinic were 1.36 on the right and 1.24 on the left 11/11; patient with a venous insufficiency wound on the right anterior lower leg.  Admitted to clinic last week. He complains bitterly about the 3 layer compression we put on him stating it was uncomfortable he could not sleep. He has previously not tolerated stockings very well as well. His ABIs in this clinic were noncompressible however his dorsalis pedis and posterior tibial pulses are easily palpable. He has decent edema control. We used Hydrofera Blue which apparently stuck to the wound. There are good improvements in wound dimensions 11/18; the patient's venous insufficiency wound on the right anterior lower leg is almost all epithelialized. However he arrives in the clinic with 2 skin tears on his forearm from a fall at noon yesterday Electronic Signature(s) Signed: 10/28/2019 5:09:09 PM By: Linton Ham MD Entered By: Linton Ham on 10/28/2019 10:16:46 Stanley Marks ([id number]) -------------------------------------------------------------------------------- Physical Exam Details Patient Name: Stanley Marks Date of Service: 10/28/2019 9:15 AM Medical Record Number: [id number] Patient Account Number: [id number] Date of Birth/Sex: 10/08/1941 (78 y.o. M) Treating RN: Cornell Barman Primary Care Provider: Lamonte Sakai Other Clinician: Referring Provider: Lamonte Sakai Treating Provider/Extender: Tito Dine in Treatment: 2 Constitutional Patient is hypertensive.. Pulse regular and within target range for patient.Marland Kitchen Respirations regular, non-labored and within target range.. Temperature is normal and within the target range for the patient.Marland Kitchen appears in no distress. Eyes Conjunctivae clear. No discharge. Respiratory Respiratory effort is easy and symmetric bilaterally. Rate is normal at rest and on room  air.. Cardiovascular Pedal pulses palpable and strong bilaterally.. Edema is controlled on the right leg and the 2 layer compression. Integumentary (Hair, Skin) Changes of chronic venous hypertension probably solar skin damage. Notes Wound exam; area in question is on the right anterior tibia. This is almost totally epithelialized although still vulnerable. We will continue to dress this. oShe has 2 new areas on the right forearm. Using pickups and scissors I removed none viable skin from around the areas these clean up quite nicely. Electronic Signature(s) Signed: 10/28/2019 5:09:09 PM By: Linton Ham MD Entered By: Linton Ham on 10/28/2019 10:29:46 Stanley Marks ([id number]) -------------------------------------------------------------------------------- Physician Orders Details Patient Name: Stanley Marks Date of Service: 10/28/2019 9:15 AM Medical Record Number: [id number] Patient Account Number: [id number] Date of Birth/Sex: November 23, 1941 (78 y.o. M) Treating RN: Army Melia Primary Care Provider: Lamonte Sakai Other Clinician: Referring Provider: Lamonte Sakai Treating Provider/Extender: Tito Dine in Treatment: 2 Verbal / Phone Orders: No Diagnosis Coding Wound Cleansing Wound #3 Right,Anterior Lower Leg o Cleanse wound with mild soap and water o May shower with protection. - DO not get dressing wet o No tub bath. Wound #4 Right,Medial Forearm o Cleanse wound with mild soap and water o May shower with protection. - DO not get dressing wet o No tub bath. Wound #5 Right,Lateral Forearm o Cleanse wound with mild soap and water o May shower with protection. - DO not get dressing wet o No tub bath. Skin Barriers/Peri-Wound Care Wound #3 Right,Anterior Lower Leg o Barrier cream Primary Wound Dressing Wound #3 Right,Anterior Lower Leg o Mepitel One Contact layer o Hydrafera Blue Ready Transfer Wound #4 Right,Medial Forearm o  Xeroform Wound #5 Right,Lateral Forearm o Xeroform Secondary Dressing Wound #3 Right,Anterior Lower Leg o Dry Gauze Wound #4 Right,Medial Forearm o Dry Gauze o Conform/Kerlix - Stretch net to secure Wound #5 Right,Lateral Forearm o Dry Gauze o Conform/Kerlix - Stretch net to secure Stanley Marks, Stanley Marks ([id number]) Dressing Change Frequency Wound #3 Right,Anterior Lower Leg o Change dressing every week o Other: - additional changes if needed Wound #4 Right,Medial Forearm o Change Dressing Monday, Wednesday,  Friday Wound #5 Right,Lateral Forearm o Change Dressing Monday, Wednesday, Friday Follow-up Appointments Wound #3 Right,Anterior Lower Leg o Return Appointment in 1 week. o Nurse Visit as needed - Patient to call if wrap slides down leg. Edema Control Wound #3 Right,Anterior Lower Leg o Kerlix and Coban - Right Lower Extremity - Unna to anchor Additional Orders / Instructions Wound #3 Right,Anterior Lower Leg o Stop Smoking o Increase protein intake. Electronic Signature(s) Signed: 10/28/2019 4:09:40 PM By: Army Melia Signed: 10/28/2019 5:09:09 PM By: Linton Ham MD Entered By: Army Melia on 10/28/2019 09:51:30 Stanley Marks ([id number]) -------------------------------------------------------------------------------- Problem List Details Patient Name: Stanley Marks Date of Service: 10/28/2019 9:15 AM Medical Record Number: [id number] Patient Account Number: [id number] Date of Birth/Sex: [birth year] (78 y.o. 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 10/14/2019 No Yes limited to breakdown of skin I87.331 Chronic venous hypertension (idiopathic)  with ulcer and 10/14/2019 No Yes inflammation of right lower extremity S41.111D Laceration without foreign body of right upper arm, 10/28/2019 No Yes subsequent encounter Inactive Problems Resolved Problems Electronic Signature(s) Signed: 10/28/2019 5:09:09 PM By: Linton Ham MD Entered By: Linton Ham on 10/28/2019 10:15:46 Stanley Marks ([id number]) -------------------------------------------------------------------------------- Progress Note Details Patient Name: Stanley Marks Date of Service: 10/28/2019 9:15 AM Medical Record Number: [id number] Patient Account Number: [id number] Date of Birth/Sex: February 27, 1941 (78 y.o. M) Treating RN: Cornell Barman Primary Care Provider: Lamonte Sakai Other Clinician: Referring Provider: Lamonte Sakai Treating Provider/Extender: Tito Dine in Treatment: 2 Subjective History of Present Illness (HPI) Pleasant 78 year old with history of chronic venous insufficiency and hypertension. No History of diabetes or peripheral arterial disease. He says that he stopped taking his "fluid pill" so that he wouldn't have to urinate as often. He subsequently developed bilateral lower extremity swelling and left calf ulcerations in late August 2016. Status post bilateral lower extremity endovenous ablation over 5 years ago in [place]. He does not regularly wear compression stockings. Denies any history of DVT. He is ambulating per his baseline. No claudication or rest pain. Tolerated 3 layer compression bandage with Prisma with significant improvement. Recently declined compression bandages and has been using a Tubigrip for edema control. He returns to clinic for follow-up and is without complaints. No significant pain. No fever or chills. No drainage. READMISSION 10/14/2019 This is a 78 year old man who is in this clinic for years ago with a venous insufficiency wound on the left leg. He was discharged with compression stockings but he does not  wear them. He states he developed a blister on the right anterior tibial area in October. He was in the ER on 10/26 after falling and noted to have a wound on his right leg/shin although the patient states the blister was already present. He was felt possibly to be mildly dehydrated he declined IV fluid. X-ray did not show any osseous abnormalities however he was noted to have severe right knee osteoarthritis. The patient has known chronic venous insufficiency. He states he had ablations in his bilateral lower legs many years ago. This seems well verified from the history noted above. He is not a diabetic and does not have known PAD. Past medical history; no diabetes, atrial fibrillation on Xarelto, history of basal cell cancer, COPD continued smoker, hypertension, peripheral vascular disease. ABIs in our clinic were  1.36 on the right and 1.24 on the left 11/11; patient with a venous insufficiency wound on the right anterior lower leg. Admitted to clinic last week. He complains bitterly about the 3 layer compression we put on him stating it was uncomfortable he could not sleep. He has previously not tolerated stockings very well as well. His ABIs in this clinic were noncompressible however his dorsalis pedis and posterior tibial pulses are easily palpable. He has decent edema control. We used Hydrofera Blue which apparently stuck to the wound. There are good improvements in wound dimensions 11/18; the patient's venous insufficiency wound on the right anterior lower leg is almost all epithelialized. However he arrives in the clinic with 2 skin tears on his forearm from a fall at noon yesterday Objective Constitutional Stanley Marks, Stanley Marks ([id number]) Patient is hypertensive.. Pulse regular and within target range for patient.Marland Kitchen Respirations regular, non-labored and within target range.. Temperature is normal and within the target range for the patient.Marland Kitchen appears in no distress. Vitals Time Taken: 9:14  AM, Height: 74 in, Weight: 300 lbs, BMI: 38.5, Temperature: 98.2 F, Pulse: 90 bpm, Respiratory Rate: 16 breaths/min, Blood Pressure: 143/90 mmHg. Eyes Conjunctivae clear. No discharge. Respiratory Respiratory effort is easy and symmetric bilaterally. Rate is normal at rest and on room air.. Cardiovascular Pedal pulses palpable and strong bilaterally.. Edema is controlled on the right leg and the 2 layer compression. General Notes: Wound exam; area in question is on the right anterior tibia. This is almost totally epithelialized although still vulnerable. We will continue to dress this. She has 2 new areas on the right forearm. Using pickups and scissors I removed none viable skin from around the areas these clean up quite nicely. Integumentary (Hair, Skin) Changes of chronic venous hypertension probably solar skin damage. Wound #3 status is Open. Original cause of wound was Gradually Appeared. The wound is located on the Right,Anterior Lower Leg. The wound measures 3cm length x 2cm width x 0.1cm depth; 4.712cm^2 area and 0.471cm^3 volume. There is Fat Layer (Subcutaneous Tissue) Exposed exposed. There is a large amount of serosanguineous drainage noted. The wound margin is flat and intact. There is medium (34-66%) pink, pale granulation within the wound bed. There is a medium (34-66%) amount of necrotic tissue within the wound bed including Eschar and Adherent Slough. Wound #4 status is Open. Original cause of wound was Trauma. The wound is located on the Right,Medial Forearm. The wound measures 4.5cm length x 1cm width x 0.1cm depth; 3.534cm^2 area and 0.353cm^3 volume. There is Fat Layer (Subcutaneous Tissue) Exposed exposed. There is no tunneling or undermining noted. There is a medium amount of serosanguineous drainage noted. There is medium (34-66%) red granulation within the wound bed. There is a medium (34- 66%) amount of necrotic tissue within the wound bed including Adherent  Slough. Wound #5 status is Open. Original cause of wound was Trauma. The wound is located on the Right,Lateral Forearm. The wound measures 1cm length x 2cm width x 0.1cm depth; 1.571cm^2 area and 0.157cm^3 volume. There is Fat Layer (Subcutaneous Tissue) Exposed exposed. There is no tunneling or undermining noted. There is a medium amount of serosanguineous drainage noted. There is medium (34-66%) red granulation within the wound bed. There is a medium (34- 66%) amount of necrotic tissue within the wound bed including Adherent Slough. 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 There was a Selective/Open Wound Skin/Epidermis Debridement with a total area of 1 sq cm performed by Ricard Dillon, MD. With the following instrument(s): Forceps to remove Non-Viable tissue/material. Material removed includes Skin: Dermis and Skin: Epidermis and after achieving pain control using Lidocaine. No specimens were taken. A time out was conducted at 09:48, prior to the start of the procedure. A Minimum amount of bleeding was controlled with Pressure. The procedure was tolerated well. Post Debridement Measurements: 1cm length x 2cm width x 0.1cm depth; 0.157cm^3 volume. Character of Wound/Ulcer Post Debridement requires further debridement. Post procedure Diagnosis Wound #5: Same as Pre-Procedure Plan Wound Cleansing: Wound #3 Right,Anterior Lower Leg: Cleanse wound with mild soap and water May shower with protection. - DO not get dressing wet No tub bath. Wound #4 Right,Medial Forearm: Cleanse wound with mild soap and  water May shower with protection. - DO not get dressing wet No tub bath. Wound #5 Right,Lateral Forearm: Cleanse wound with mild soap and water May shower with protection. - DO not get dressing wet No tub bath. Skin Barriers/Peri-Wound Care: Wound #3 Right,Anterior Lower Leg: Barrier cream Primary Wound Dressing: Wound #3 Right,Anterior Lower Leg: Mepitel One Contact layer Hydrafera Blue Ready Transfer Wound #4 Right,Medial Forearm: Xeroform Wound #5 Right,Lateral Forearm: Xeroform Secondary Dressing: Wound #3 Right,Anterior Lower Leg: Dry Gauze Wound #4 Right,Medial Forearm: Dry Gauze Conform/Kerlix - Stretch net to secure Wound #5 Right,Lateral Forearm: Dry Gauze Conform/Kerlix - Stretch net to secure Dressing Change Frequency: Wound #3 Right,Anterior Lower Leg: Change dressing every week Other: - additional changes if needed Wound #4 Right,Medial Forearm: Change Dressing Monday, Wednesday, Friday Stanley Marks, Stanley Marks ([id number]) Wound #5 Right,Lateral Forearm: Change Dressing Monday, Wednesday, Friday Follow-up Appointments: Wound #3 Right,Anterior Lower Leg: Return Appointment in 1 week. Nurse Visit as needed - Patient to call if wrap slides down leg. Edema Control: Wound #3 Right,Anterior Lower Leg: Kerlix and Coban - Right Lower Extremity - Unna to anchor Additional Orders / Instructions: Wound #3 Right,Anterior Lower Leg: Stop Smoking Increase protein intake. 1. Hydrofera Blue to the right anterior lower leg still under Curlex Coban which she seems to be tolerating better than 3 layer compression 2. The wound on the right is just about healed 3. On the forearm we applied Xeroform and Kerlix wrap. Electronic Signature(s) Signed: 10/28/2019 5:09:09 PM By: Linton Ham MD Entered By: Linton Ham on 10/28/2019 10:31:03 Stanley Marks ([id number]) -------------------------------------------------------------------------------- [place] Details Patient Name:  Stanley Marks Date of Service: 10/28/2019 Medical Record Number: [id number] Patient Account Number: [id number] Date of Birth/Sex: [birth year] (78 y.o. M) Treating RN: Cornell Barman Primary Care Provider: Lamonte Sakai Other Clinician: Referring Provider: Lamonte Sakai Treating Provider/Extender: Tito Dine in Treatment: 2 Diagnosis Coding ICD-10 Codes Code Description L97.811 Non-pressure chronic ulcer of other part of right lower leg limited to breakdown of skin I87.331 Chronic venous hypertension (idiopathic) with ulcer and inflammation of right lower extremity S41.111D Laceration without foreign body of right upper arm, subsequent encounter Facility Procedures CPT4 Code: [id number] Description: [id number] - DEBRIDE WOUND 1ST 20 SQ CM OR < ICD-10 Diagnosis Description S41.111D Laceration without foreign body of right upper arm, subsequent Modifier: encounter Quantity: 1 Physician Procedures CPT4 [id number] Description: [id number] - WC PHYS DEBR WO ANESTH 20 SQ CM ICD-10 Diagnosis Description S41.111D Laceration without foreign body of right upper arm,  subsequent Modifier: encounter Quantity: 1 Electronic Signature(s) Signed: 10/28/2019 5:09:09 PM By: Linton Ham MD Entered By: Linton Ham on 10/28/2019 10:31:19

## 2019-11-04 ENCOUNTER — Other Ambulatory Visit: Payer: Self-pay

## 2019-11-04 ENCOUNTER — Encounter: Payer: Medicare Other | Admitting: Physician Assistant

## 2019-11-04 DIAGNOSIS — L97811 Non-pressure chronic ulcer of other part of right lower leg limited to breakdown of skin: Secondary | ICD-10-CM | POA: Diagnosis not present

## 2019-11-05 NOTE — Progress Notes (Signed)
IQBAL, CAMELO ([id number]) Visit Report for 11/04/2019 Arrival Information Details Patient Name: Stanley Marks, Stanley Marks Date of Service: 11/04/2019 1:45 PM Medical Record Number: [id number] Patient Account Number: [id number] Date of Birth/Sex: 03-Jan-1941 (78 y.o. M) Treating RN: Harold Barban Primary Care Lebert Lovern: Lamonte Sakai Other Clinician: Referring Demetrius Mahler: Lamonte Sakai Treating Landan Fedie/Extender: Melburn Hake, HOYT Weeks in Treatment: 3 Visit Information History Since Last Visit Added or deleted any medications: No Patient Arrived: Ambulatory Any new allergies or adverse reactions: No Arrival Time: 13:56 Had a fall or experienced change in No Accompanied By: granddaughter activities of daily living that may affect Transfer Assistance: None risk of falls: Patient Identification Verified: Yes Signs or symptoms of abuse/neglect since last visito No Secondary Verification Process Yes Hospitalized since last visit: No Completed: Has Dressing in Place as Prescribed: Yes Patient Has Alerts: Yes Has Compression in Place as Prescribed: Yes Patient Alerts: Patient on Blood Pain Present Now: No Thinner Xarelto Electronic Signature(s) Signed: 11/04/2019 4:07:24 PM By: Harold Barban Entered By: Harold Barban on 11/04/2019 14:00:03 Stanley Marks ([id number]) -------------------------------------------------------------------------------- Lower Extremity Assessment Details Patient Name: Stanley Marks Date of Service: 11/04/2019 1:45 PM Medical Record Number: [id number] Patient Account Number: [id number] Date of Birth/Sex: [birth year] (78 y.o. M) Treating RN: Harold Barban Primary Care Maurica Omura: Lamonte Sakai Other Clinician: Referring Cathlin Buchan: Lamonte Sakai Treating Taniela Feltus/Extender: Melburn Hake, HOYT Weeks in Treatment: 3 Edema Assessment Assessed: [Left: No] [Right: No] [Left: Edema] [Right: :] Calf Left: Right: Point of Measurement: 36 cm From Medial Instep cm 41  cm Ankle Left: Right: Point of Measurement: 12 cm From Medial Instep cm 24.5 cm Vascular Assessment Pulses: Dorsalis Pedis Palpable: [Right:Yes] Posterior Tibial Palpable: [Right:Yes] Electronic Signature(s) Signed: 11/04/2019 4:07:24 PM By: Harold Barban Entered By: Harold Barban on 11/04/2019 14:04:55 Stanley Marks ([id number]) -------------------------------------------------------------------------------- Multi Wound Chart Details Patient Name: Stanley Marks Date of Service: 11/04/2019 1:45 PM Medical Record Number: [id number] Patient Account Number: [id number] Date of Birth/Sex: 1941/04/06 (78 y.o. M) Treating RN: Cornell Barman Primary Care Marko Skalski: Lamonte Sakai Other Clinician: Referring Ricardo Schubach: Lamonte Sakai Treating Azul Coffie/Extender: Melburn Hake, HOYT Weeks in Treatment: 3 Vital Signs Height(in): 30 Pulse(bpm): 63 Weight(lbs): 300 Blood Pressure(mmHg): 115/62 Body Mass Index(BMI): 39 Temperature(F): 97.9 Respiratory Rate 18 (breaths/min): Photos: Wound Location: Right, Anterior Lower Leg Right Forearm - Medial Right Forearm - Lateral Wounding Event: Gradually Appeared Trauma Trauma Primary Etiology: Venous Leg Ulcer Skin Tear Skin Tear Comorbid History: Cataracts, Chronic Obstructive Cataracts, Chronic Obstructive Cataracts, Chronic Obstructive Pulmonary Disease (COPD), Pulmonary Disease (COPD), Pulmonary Disease (COPD), Arrhythmia, Congestive Heart Arrhythmia, Congestive Heart Arrhythmia, Congestive Heart Failure, Hypertension, Failure, Hypertension, Failure, Hypertension, Peripheral Venous Disease, Peripheral Venous Disease, Peripheral Venous Disease, Osteomyelitis Osteomyelitis Osteomyelitis Date Acquired: 09/30/2019 10/27/2019 12/26/2018 Weeks of Treatment: 3 1 1  Wound Status: Open Open Open Measurements L x W x D 1x0.2x0.1 1.3x1.4x0.1 1.1x1.7x0.1 (cm) Area (cm) : 0.157 1.429 1.469 Volume (cm) : 0.016 0.143 0.147 % Reduction in Area: 98.80% 59.60%  6.50% % Reduction in Volume: 98.80% 59.50% 6.40% Classification: Full Thickness Without Partial Thickness Partial Thickness Exposed Support Structures Exudate Amount: Large Medium Medium Exudate Type: Serosanguineous Serosanguineous Serosanguineous Exudate Color: red, brown red, brown red, brown Wound Margin: Flat and Intact N/A N/A Granulation Amount: Medium (34-66%) Medium (34-66%) Medium (34-66%) Granulation Quality: Pink, Pale Red Red Necrotic Amount: Medium (34-66%) Medium (34-66%) Medium (34-66%) Necrotic Tissue: Eschar, Adherent [place] Exposed Structures: [place], Rolan Lipa ([id number]) Fat Layer (Subcutaneous Fat Layer (Subcutaneous Fat Layer (Subcutaneous Tissue) Exposed: Yes Tissue) Exposed: Yes Tissue) Exposed: Yes Fascia: No  Fascia: No Fascia: No Tendon: No Tendon: No Tendon: No Muscle: No Muscle: No Muscle: No Joint: No Joint: No Joint: No Bone: No Bone: No Bone: No Epithelialization: None None None Treatment Notes Electronic Signature(s) Signed: 11/04/2019 5:12:33 PM By: Gretta Cool, BSN, RN, CWS, Kim RN, BSN Entered By: Gretta Cool, BSN, RN, CWS, Kim on 11/04/2019 14:31:38 Stanley Marks ([id number]) -------------------------------------------------------------------------------- Multi-Disciplinary Care Plan Details Patient Name: Stanley Marks Date of Service: 11/04/2019 1:45 PM Medical Record Number: [id number] Patient Account Number: [id number] Date of Birth/Sex: 1941/01/29 (78 y.o. M) Treating RN: Cornell Barman Primary Care Layanna Charo: Lamonte Sakai Other Clinician: Referring Xavian Hardcastle: Lamonte Sakai Treating Daltin Crist/Extender: Melburn Hake, HOYT Weeks in Treatment: 3 Active Inactive Abuse / Safety / Falls / Self Care Management Nursing Diagnoses: Impaired physical mobility Potential for falls Goals: Patient will remain injury free related to falls Date Initiated: 10/14/2019 Target Resolution Date: 10/21/2019 Goal Status:  Active Patient/caregiver will verbalize understanding of skin care regimen Date Initiated: 10/14/2019 Target Resolution Date: 10/21/2019 Goal Status: Active Interventions: Assess fall risk on admission and as needed Notes: Orientation to the Wound Care Program Nursing Diagnoses: Knowledge deficit related to the wound healing center program Goals: Patient/caregiver will verbalize understanding of the [facility] [place] [facility] Date Initiated: 10/14/2019 Target Resolution Date: 10/15/2019 Goal Status: Active Interventions: Provide education on orientation to the wound center Notes: Venous Leg Ulcer Nursing Diagnoses: Actual venous Insuffiency (use after diagnosis is confirmed) Goals: Patient/caregiver will verbalize understanding of disease process and disease management Date Initiated: 10/14/2019 Target Resolution Date: 10/21/2019 KAILIN, VUU ([id number]) Goal Status: Active Interventions: Assess peripheral edema status every visit. Treatment Activities: Therapeutic compression applied : 10/14/2019 Notes: Wound/Skin Impairment Nursing Diagnoses: Impaired tissue integrity Goals: Ulcer/skin breakdown will have a volume reduction of 30% by week 4 Date Initiated: 10/14/2019 Target Resolution Date: 11/11/2019 Goal Status: Active Interventions: Assess ulceration(s) every visit Treatment Activities: Skin care regimen initiated : 10/14/2019 Topical wound management initiated : 10/14/2019 Notes: Electronic Signature(s) Signed: 11/04/2019 5:12:33 PM By: Gretta Cool, BSN, RN, CWS, Kim RN, BSN Entered By: Gretta Cool, BSN, RN, CWS, Kim on 11/04/2019 14:31:25 Stanley Marks ([id number]) -------------------------------------------------------------------------------- Pain Assessment Details Patient Name: Stanley Marks Date of Service: 11/04/2019 1:45 PM Medical Record Number: [id number] Patient Account Number: [id number] Date of Birth/Sex: [birth year] (78 y.o. M) Treating RN: Harold Barban Primary Care Duwan Adrian: Lamonte Sakai Other Clinician: Referring Ah Bott: Lamonte Sakai Treating Evalyse Stroope/Extender: Melburn Hake, HOYT Weeks in Treatment: 3 Active Problems Location of Pain Severity and Description of Pain Patient Has Paino No Site Locations Pain Management and Medication Current Pain Management: Electronic Signature(s) Signed: 11/04/2019 4:07:24 PM By: Harold Barban Entered By: Harold Barban on 11/04/2019 14:00:08 Stanley Marks ([id number]) -------------------------------------------------------------------------------- Patient/Caregiver Education Details Patient Name: Stanley Marks Date of Service: 11/04/2019 1:45 PM Medical Record Number: [id number] Patient Account Number: [id number] Date of Birth/Gender: Dec 11, 1940 (78 y.o. M) Treating RN: Cornell Barman Primary Care Physician: Lamonte Sakai Other Clinician: Referring Physician: Lamonte Sakai Treating Physician/Extender: Sharalyn Ink in Treatment: 3 Education Assessment Education Provided To: Patient Education Topics Provided Wound/Skin Impairment: Handouts: Caring for Your Ulcer Methods: Demonstration, Explain/Verbal Responses: State content correctly Electronic Signature(s) Signed: 11/04/2019 5:12:33 PM By: Gretta Cool, BSN, RN, CWS, Kim RN, BSN Entered By: Gretta Cool, BSN, RN, CWS, Kim on 11/04/2019 14:34:33 Stanley Marks ([id number]) -------------------------------------------------------------------------------- Wound Assessment Details Patient Name: Stanley Marks Date of Service: 11/04/2019 1:45 PM Medical Record Number: [id number] Patient Account Number: [id number] Date of Birth/Sex: Oct 23, 1941 (78 y.o. M) Treating RN: Cornell Barman Primary Care [place] Yeats: Lamonte Sakai Other Clinician: Referring Aikam Hellickson:  Humphrey Rolls, Neelam Treating Derl Abalos/Extender: STONE III, HOYT Weeks in Treatment: 3 Wound Status Wound Number: 3 Primary Venous Leg Ulcer Etiology: Wound Location: Right, Anterior Lower Leg Wound  Open Wounding Event: Gradually Appeared Status: Date Acquired: 09/30/2019 Comorbid Cataracts, Chronic Obstructive Pulmonary Weeks Of Treatment: 3 History: Disease (COPD), Arrhythmia, Congestive Heart Clustered Wound: No Failure, Hypertension, Peripheral Venous Disease, Osteomyelitis Photos Wound Measurements Length: (cm) 1 Width: (cm) 0.2 Depth: (cm) 0.1 Area: (cm) 0.157 Volume: (cm) 0.016 % Reduction in Area: 98.8% % Reduction in Volume: 98.8% Epithelialization: None Tunneling: No Undermining: No Wound Description Full Thickness Without Exposed Support Classification: Structures Wound Margin: Flat and Intact Exudate Large Amount: Exudate Type: Serosanguineous Exudate Color: red, brown Foul Odor After Cleansing: No Slough/Fibrino Yes Wound Bed Granulation Amount: Medium (34-66%) Exposed Structure Granulation Quality: Pink, Pale Fascia Exposed: No Necrotic Amount: Medium (34-66%) Fat Layer (Subcutaneous Tissue) Exposed: Yes Necrotic Quality: Eschar, Adherent Slough Tendon Exposed: No Muscle Exposed: No Joint Exposed: No Bone Exposed: No SHOICHI, VANCOTT ([id number]) Electronic Signature(s) Signed: 11/04/2019 5:12:33 PM By: Gretta Cool, BSN, RN, CWS, Kim RN, BSN Entered By: Gretta Cool, BSN, RN, CWS, Kim on 11/04/2019 14:31:15 Stanley Marks ([id number]) -------------------------------------------------------------------------------- Wound Assessment Details Patient Name: Stanley Marks Date of Service: 11/04/2019 1:45 PM Medical Record Number: [id number] Patient Account Number: [id number] Date of Birth/Sex: [birth year] (78 y.o. M) Treating RN: Harold Barban Primary Care Jazilyn Siegenthaler: Lamonte Sakai Other Clinician: Referring Deasia Chiu: Lamonte Sakai Treating Audreanna Torrisi/Extender: Melburn Hake, HOYT Weeks in Treatment: 3 Wound Status Wound Number: 4 Primary Skin Tear Etiology: Wound Location: Right Forearm - Medial Wound Open Wounding Event: Trauma Status: Date Acquired:  10/27/2019 Comorbid Cataracts, Chronic Obstructive Pulmonary Weeks Of Treatment: 1 History: Disease (COPD), Arrhythmia, Congestive Heart Clustered Wound: No Failure, Hypertension, Peripheral Venous Disease, Osteomyelitis Photos Wound Measurements Length: (cm) 1.3 % Reduction in Width: (cm) 1.4 % Reduction in Depth: (cm) 0.1 Epithelializat Area: (cm) 1.429 Tunneling: Volume: (cm) 0.143 Undermining: Area: 59.6% Volume: 59.5% ion: None No No Wound Description Classification: Partial Thickness Foul Odor Aft Exudate Amount: Medium Slough/Fibrin Exudate Type: Serosanguineous Exudate Color: red, brown er Cleansing: No o Yes Wound Bed Granulation Amount: Medium (34-66%) Exposed Structure Granulation Quality: Red Fascia Exposed: No Necrotic Amount: Medium (34-66%) Fat Layer (Subcutaneous Tissue) Exposed: Yes Necrotic Quality: Adherent Slough Tendon Exposed: No Muscle Exposed: No Joint Exposed: No Bone Exposed: No Electronic Signature(sBRIAR, STPIERRE ([id number]) Signed: 11/04/2019 4:07:24 PM By: Harold Barban Entered By: Harold Barban on 11/04/2019 14:15:31 Stanley Marks ([id number]) -------------------------------------------------------------------------------- Wound Assessment Details Patient Name: Stanley Marks Date of Service: 11/04/2019 1:45 PM Medical Record Number: [id number] Patient Account Number: [id number] Date of Birth/Sex: May 28, 1941 (78 y.o. M) Treating RN: Harold Barban Primary Care Marzella Miracle: Lamonte Sakai Other Clinician: Referring Daffney Greenly: Lamonte Sakai Treating Lari Linson/Extender: Melburn Hake, HOYT Weeks in Treatment: 3 Wound Status Wound Number: 5 Primary Skin Tear Etiology: Wound Location: Right Forearm - Lateral Wound Open Wounding Event: Trauma Status: Date Acquired: 12/26/2018 Comorbid Cataracts, Chronic Obstructive Pulmonary Weeks Of Treatment: 1 History: Disease (COPD), Arrhythmia, Congestive Heart Clustered Wound: No Failure,  Hypertension, Peripheral Venous Disease, Osteomyelitis Photos Wound Measurements Length: (cm) 1.1 % Reduction i Width: (cm) 1.7 % Reduction i Depth: (cm) 0.1 Epithelializa Area: (cm) 1.469 Tunneling: Volume: (cm) 0.147 Undermining: n Area: 6.5% n Volume: 6.4% tion: None No No Wound Description Classification: Partial Thickness Foul Odor Af Exudate Amount: Medium Slough/Fibri Exudate Type: Serosanguineous Exudate Color: red, brown ter Cleansing: No no Yes Wound Bed Granulation Amount: Medium (34-66%) Exposed Structure Granulation Quality: Red Fascia Exposed: No Necrotic  Amount: Medium (34-66%) Fat Layer (Subcutaneous Tissue) Exposed: Yes Necrotic Quality: Adherent Slough Tendon Exposed: No Muscle Exposed: No Joint Exposed: No Bone Exposed: No Electronic Signature(s) AVEN, ESSA ([id number]) Signed: 11/04/2019 4:07:24 PM By: Harold Barban Entered By: Harold Barban on 11/04/2019 Soper, Niv ([id number]) -------------------------------------------------------------------------------- [place] Details Patient Name: Stanley Marks Date of Service: 11/04/2019 1:45 PM Medical Record Number: [id number] Patient Account Number: [id number] Date of Birth/Sex: 23-May-1941 (78 y.o. M) Treating RN: Harold Barban Primary Care Aengus Sauceda: Lamonte Sakai Other Clinician: Referring Cataleia Gade: Lamonte Sakai Treating Dang Mathison/Extender: Melburn Hake, HOYT Weeks in Treatment: 3 Vital Signs Time Taken: 14:00 Temperature (F): 97.9 Height (in): 74 Pulse (bpm): 75 Weight (lbs): 300 Respiratory Rate (breaths/min): 18 Body Mass Index (BMI): 38.5 Blood Pressure (mmHg): 115/62 Reference Range: 80 - 120 mg / dl Electronic Signature(s) Signed: 11/04/2019 4:07:24 PM By: Harold Barban Entered By: Harold Barban on 11/04/2019 14:01:13

## 2019-11-05 NOTE — Progress Notes (Signed)
CARSEN, SHODA ([id number]) Visit Report for 11/04/2019 Chief Complaint Document Details Patient Name: Stanley Marks, Stanley Marks Date of Service: 11/04/2019 1:45 PM Medical Record Number: [id number] Patient Account Number: [id number] Date of Birth/Sex: [birth year] (78 y.o. M) Treating RN: Cornell Barman Primary Care Provider: Lamonte Sakai Other Clinician: Referring Provider: Lamonte Sakai Treating Provider/Extender: Melburn Hake, HOYT Weeks in Treatment: 3 Information Obtained from: Patient Chief Complaint Left calf ulcerations. 10/14/2019; patient is here for review of of a wound on the right anterior mid tibia Electronic Signature(s) Signed: 11/04/2019 4:47:05 PM By: Worthy Keeler PA-C Entered By: Worthy Keeler on 11/04/2019 13:57:18 [name] ([id number]) -------------------------------------------------------------------------------- Debridement Details Patient Name: [name] Date of Service: 11/04/2019 1:45 PM Medical Record Number: [id number] Patient Account Number: [id number] Date of Birth/Sex: 09-Nov-1941 (78 y.o. M) Treating RN: Cornell Barman Primary Care Provider: Lamonte Sakai Other Clinician: Referring Provider: Lamonte Sakai Treating Provider/Extender: Melburn Hake, HOYT Weeks in Treatment: 3 Debridement Performed for Wound #3 Right,Anterior Lower Leg Assessment: Performed By: Physician STONE III, HOYT E., PA-C Debridement Type: Debridement Severity of Tissue Pre Fat layer exposed Debridement: Level of Consciousness (Pre- Awake and Alert procedure): Pre-procedure Verification/Time Yes - 14:28 Out Taken: Start Time: 14:28 Pain Control: Lidocaine Total Area Debrided (L x W): 1 (cm) x 0.2 (cm) = 0.2 (cm) Tissue and other material Viable, Non-Viable, Eschar, Subcutaneous debrided: Level: Skin/Subcutaneous Tissue Debridement Description: Excisional Instrument: Curette Bleeding: Minimum Hemostasis Achieved: Pressure End Time: 14:32 Response to Treatment: Procedure was  tolerated well Level of Consciousness Awake and Alert (Post-procedure): Post Debridement Measurements of Total Wound Length: (cm) 1 Width: (cm) 0.2 Depth: (cm) 0.2 Volume: (cm) 0.031 Character of Wound/Ulcer Post Debridement: Stable Severity of Tissue Post Debridement: Fat layer exposed Post Procedure Diagnosis Same as Pre-procedure Electronic Signature(s) Signed: 11/04/2019 4:47:05 PM By: Worthy Keeler PA-C Signed: 11/04/2019 5:12:33 PM By: Gretta Cool, BSN, RN, CWS, Kim RN, BSN Entered By: Gretta Cool, BSN, RN, CWS, Kim on 11/04/2019 14:32:45 [name] ([id number]) -------------------------------------------------------------------------------- HPI Details Patient Name: [name] Date of Service: 11/04/2019 1:45 PM Medical Record Number: [id number] Patient Account Number: [id number] Date of Birth/Sex: [birth year] (78 y.o. M) Treating RN: Cornell Barman Primary Care Provider: Lamonte Sakai Other Clinician: Referring Provider: Lamonte Sakai Treating Provider/Extender: Melburn Hake, HOYT Weeks in Treatment: 3 History of Present Illness HPI Description: Pleasant 78 year old with history of chronic venous insufficiency and hypertension. No History of diabetes or peripheral arterial disease. He says that he stopped taking his "fluid pill" so that he wouldn't have to urinate as often. He subsequently developed bilateral lower extremity swelling and left calf ulcerations in late August 2016. Status post bilateral lower extremity endovenous ablation over 5 years ago in [place]. He does not regularly wear compression stockings. Denies any history of DVT. He is ambulating per his baseline. No claudication or rest pain. Tolerated 3 layer compression bandage with Prisma with significant improvement. Recently declined compression bandages and has been using a Tubigrip for edema control. He returns to clinic for follow-up and is without complaints. No significant pain. No fever or chills. No  drainage. READMISSION 10/14/2019 This is a 78 year old man who is in this clinic for years ago with a venous insufficiency wound on the left leg. He was discharged with compression stockings but he does not wear them. He states he developed a blister on the right anterior tibial area in October. He was in the ER on 10/26 after falling and noted to have a wound on his right leg/shin although the patient states the blister was  already present. He was felt possibly to be mildly dehydrated he declined IV fluid. X-ray did not show any osseous abnormalities however he was noted to have severe right knee osteoarthritis. The patient has known chronic venous insufficiency. He states he had ablations in his bilateral lower legs many years ago. This seems well verified from the history noted above. He is not a diabetic and does not have known PAD. Past medical history; no diabetes, atrial fibrillation on Xarelto, history of basal cell cancer, COPD continued smoker, hypertension, peripheral vascular disease. ABIs in our clinic were 1.36 on the right and 1.24 on the left 11/11; patient with a venous insufficiency wound on the right anterior lower leg. Admitted to clinic last week. He complains bitterly about the 3 layer compression we put on him stating it was uncomfortable he could not sleep. He has previously not tolerated stockings very well as well. His ABIs in this clinic were noncompressible however his dorsalis pedis and posterior tibial pulses are easily palpable. He has decent edema control. We used Hydrofera Blue which apparently stuck to the wound. There are good improvements in wound dimensions 11/18; the patient's venous insufficiency wound on the right anterior lower leg is almost all epithelialized. However he arrives in the clinic with 2 skin tears on his forearm from a fall at noon yesterday 11/04/2019 on evaluation today patient appears to be doing well with regard to his leg ulcer as well as  his right forearm ulcers. He has been tolerating the dressing changes without complication. Fortunately there is no sign of active infection at this time. No fevers, chills, nausea, vomiting, or diarrhea. I am extremely pleased with how things seem to be progressing based on what I am seeing today. Electronic Signature(s) Signed: 11/04/2019 4:47:05 PM By: Worthy Keeler PA-C Entered By: Worthy Keeler on 11/04/2019 14:34:23 TRASON, LEFAVE ([id number]) -------------------------------------------------------------------------------- Physical Exam Details Patient Name: [name] Date of Service: 11/04/2019 1:45 PM Medical Record Number: [id number] Patient Account Number: [id number] Date of Birth/Sex: 1941/01/26 (78 y.o. M) Treating RN: Cornell Barman Primary Care Provider: Lamonte Sakai Other Clinician: Referring Provider: Lamonte Sakai Treating Provider/Extender: Melburn Hake, HOYT Weeks in Treatment: 3 Constitutional Well-nourished and well-hydrated in no acute distress. Respiratory normal breathing without difficulty. Psychiatric this patient is able to make decisions and demonstrates good insight into disease process. Alert and Oriented x 3. pleasant and cooperative. Notes Patient's wound bed currently did require some sharp debridement to clear away some of the dry eschar and blood on the surface of the wound. He tolerated this today without complication there was a small opening still remaining underneath but fortunately he seems to be in general doing quite well. His edema is well controlled the Curlex and Coban wrap is doing well for him. Electronic Signature(s) Signed: 11/04/2019 4:47:05 PM By: Worthy Keeler PA-C Entered By: Worthy Keeler on 11/04/2019 14:35:20 [name] ([id number]) -------------------------------------------------------------------------------- Physician Orders Details Patient Name: [name] Date of Service: 11/04/2019 1:45 PM Medical Record  Number: [id number] Patient Account Number: [id number] Date of Birth/Sex: 1941-05-14 (78 y.o. M) Treating RN: Cornell Barman Primary Care Provider: Lamonte Sakai Other Clinician: Referring Provider: Lamonte Sakai Treating Provider/Extender: Melburn Hake, HOYT Weeks in Treatment: 3 Verbal / Phone Orders: No Diagnosis Coding ICD-10 Coding Code Description L97.811 Non-pressure chronic ulcer of other part of right lower leg limited to breakdown of skin I87.331 Chronic venous hypertension (idiopathic) with ulcer and inflammation of right lower extremity S41.111D Laceration without foreign body of right upper arm,  subsequent encounter Wound Cleansing Wound #3 Right,Anterior Lower Leg o Cleanse wound with mild soap and water o May shower with protection. - DO not get dressing wet o No tub bath. Wound #4 Right,Medial Forearm o Cleanse wound with mild soap and water o May shower with protection. - DO not get dressing wet o No tub bath. Wound #5 Right,Lateral Forearm o Cleanse wound with mild soap and water o May shower with protection. - DO not get dressing wet o No tub bath. Skin Barriers/Peri-Wound Care Wound #3 Right,Anterior Lower Leg o Barrier cream Primary Wound Dressing Wound #3 Right,Anterior Lower Leg o Mepitel One Contact layer Wound #4 Right,Medial Forearm o Xeroform Wound #5 Right,Lateral Forearm o Xeroform Secondary Dressing Wound #3 Right,Anterior Lower Leg o Dry Gauze Wound #4 Right,Medial Forearm o Dry Gauze o Conform/Kerlix - Stretch net to secure DANTHONY, STONEBREAKER ([id number]) Wound #5 Right,Lateral Forearm o Dry Gauze o Conform/Kerlix - Stretch net to secure Dressing Change Frequency Wound #3 Right,Anterior Lower Leg o Change dressing every week o Other: - additional changes if needed Wound #4 Right,Medial Forearm o Change Dressing Monday, Wednesday, Friday Wound #5 Right,Lateral Forearm o Change Dressing Monday, Wednesday,  Friday Follow-up Appointments Wound #3 Right,Anterior Lower Leg o Return Appointment in 1 week. o Nurse Visit as needed - Patient to call if wrap slides down leg. Edema Control Wound #3 Right,Anterior Lower Leg o Kerlix and Coban - Right Lower Extremity - Unna to anchor Additional Orders / Instructions Wound #3 Right,Anterior Lower Leg o Stop Smoking o Increase protein intake. Electronic Signature(s) Signed: 11/04/2019 4:47:05 PM By: Worthy Keeler PA-C Signed: 11/04/2019 5:12:33 PM By: Gretta Cool BSN, RN, CWS, Kim RN, BSN Entered By: Gretta Cool, BSN, RN, CWS, Kim on 11/04/2019 14:33:53 [name] ([id number]) -------------------------------------------------------------------------------- Problem List Details Patient Name: [name] Date of Service: 11/04/2019 1:45 PM Medical Record Number: [id number] Patient Account Number: [id number] Date of Birth/Sex: 27-Dec-1940 (78 y.o. 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 10/14/2019 No Yes limited to breakdown of skin I87.331 Chronic venous hypertension (idiopathic) with ulcer and 10/14/2019 No Yes inflammation of right lower extremity S41.111D Laceration without foreign body of right upper arm, 10/28/2019 No Yes subsequent encounter Inactive Problems Resolved Problems Electronic Signature(s) Signed: 11/04/2019 4:47:05 PM By: Worthy Keeler PA-C Entered By: Worthy Keeler on 11/04/2019 13:57:13 [name] ([id number]) -------------------------------------------------------------------------------- Progress Note/History and Physical Details Patient Name: [name] Date of Service: 11/04/2019 1:45 PM Medical Record Number:  [id number] Patient Account Number: [id number] Date of Birth/Sex: September 10, 1941 (78 y.o. M) Treating RN: Cornell Barman Primary Care Provider: Lamonte Sakai Other Clinician: Referring Provider: Lamonte Sakai Treating Provider/Extender: Melburn Hake, HOYT Weeks in Treatment: 3 Subjective Chief Complaint Information obtained from Patient Left calf ulcerations. 10/14/2019; patient is here for review of of a wound on the right anterior mid tibia History of Present Illness (HPI) Pleasant 78 year old with history of chronic venous insufficiency and hypertension. No History of diabetes or peripheral arterial disease. He says that he stopped taking his "fluid pill" so that he wouldn't have to urinate as often. He subsequently developed bilateral lower extremity swelling and left calf ulcerations in late August 2016. Status post bilateral lower extremity endovenous ablation over 5 years ago in [place]. He does not regularly wear compression stockings. Denies  any history of DVT. He is ambulating per his baseline. No claudication or rest pain. Tolerated 3 layer compression bandage with Prisma with significant improvement. Recently declined compression bandages and has been using a Tubigrip for edema control. He returns to clinic for follow-up and is without complaints. No significant pain. No fever or chills. No drainage. READMISSION 10/14/2019 This is a 78 year old man who is in this clinic for years ago with a venous insufficiency wound on the left leg. He was discharged with compression stockings but he does not wear them. He states he developed a blister on the right anterior tibial area in October. He was in the ER on 10/26 after falling and noted to have a wound on his right leg/shin although the patient states the blister was already present. He was felt possibly to be mildly dehydrated he declined IV fluid. X-ray did not show any osseous abnormalities however he was noted to have severe right knee  osteoarthritis. The patient has known chronic venous insufficiency. He states he had ablations in his bilateral lower legs many years ago. This seems well verified from the history noted above. He is not a diabetic and does not have known PAD. Past medical history; no diabetes, atrial fibrillation on Xarelto, history of basal cell cancer, COPD continued smoker, hypertension, peripheral vascular disease. ABIs in our clinic were 1.36 on the right and 1.24 on the left 11/11; patient with a venous insufficiency wound on the right anterior lower leg. Admitted to clinic last week. He complains bitterly about the 3 layer compression we put on him stating it was uncomfortable he could not sleep. He has previously not tolerated stockings very well as well. His ABIs in this clinic were noncompressible however his dorsalis pedis and posterior tibial pulses are easily palpable. He has decent edema control. We used Hydrofera Blue which apparently stuck to the wound. There are good improvements in wound dimensions 11/18; the patient's venous insufficiency wound on the right anterior lower leg is almost all epithelialized. However he arrives in the clinic with 2 skin tears on his forearm from a fall at noon yesterday 11/04/2019 on evaluation today patient appears to be doing well with regard to his leg ulcer as well as his right forearm ulcers. He has been tolerating the dressing changes without complication. Fortunately there is no sign of active infection at this time. No fevers, chills, nausea, vomiting, or diarrhea. I am extremely pleased with how things seem to be progressing based on what I am seeing today. STANELY, CHAPEL ([id number]) Patient History Information obtained from Patient. Family History Heart Disease - Mother,Siblings, Hypertension - Mother,Siblings, No family history of Cancer, Diabetes, Kidney Disease, Lung Disease, Seizures, Stroke, Thyroid Problems. Social History Current every day  smoker, Marital Status - Married, Alcohol Use - Never, Drug Use - No History, Caffeine Use - Daily. Medical History Eyes Patient has history of Cataracts Denies history of Glaucoma, Optic Neuritis Ear/Nose/Mouth/Throat Denies history of Chronic sinus problems/congestion Hematologic/Lymphatic Denies history of Anemia, Hemophilia, Human Immunodeficiency Virus, Lymphedema, Sickle Cell Disease Respiratory Patient has history of Chronic Obstructive Pulmonary Disease (COPD) Denies history of Aspiration, Asthma, Pneumothorax, Sleep Apnea, Tuberculosis Cardiovascular Patient has history of Arrhythmia - Bradycardia, Congestive Heart Failure, Hypertension, Peripheral Venous Disease Denies history of Angina, Coronary Artery Disease, Deep Vein Thrombosis, Hypotension, Myocardial Infarction, Peripheral Arterial Disease, Phlebitis, Vasculitis Gastrointestinal Denies history of Cirrhosis , Colitis, Crohn s Endocrine Denies history of Type I Diabetes, Type II Diabetes Genitourinary Denies history of End Stage Renal Disease  Immunological Denies history of Lupus Erythematosus, Raynaud s, Scleroderma Integumentary (Skin) Denies history of History of Burn, History of pressure wounds Musculoskeletal Patient has history of Osteomyelitis - right shin Denies history of Gout, Rheumatoid Arthritis, Osteoarthritis Neurologic Denies history of Dementia, Neuropathy, Quadriplegia, Paraplegia, Seizure Disorder Oncologic Denies history of Received Chemotherapy, Received Radiation Psychiatric Denies history of Anorexia/bulimia, Confinement Anxiety Medical And Surgical History Notes Constitutional Symptoms (General Health) Fluid swelling; HTN medicarion in past; appendicitis; hepatitis Oncologic Skin Cancer Review of Systems (ROS) Constitutional Symptoms (General Health) Denies complaints or symptoms of Fatigue, Fever, Chills, Marked Weight Change. Respiratory Denies complaints or symptoms of Chronic or  frequent coughs, Shortness of Breath. Cardiovascular Complains or has symptoms of LE edema. Denies complaints or symptoms of Chest pain. Psychiatric DEXTON, ATHEY ([id number]) Denies complaints or symptoms of Anxiety, Claustrophobia. Objective Constitutional Well-nourished and well-hydrated in no acute distress. Vitals Time Taken: 2:00 PM, Height: 74 in, Weight: 300 lbs, BMI: 38.5, Temperature: 97.9 F, Pulse: 75 bpm, Respiratory Rate: 18 breaths/min, Blood Pressure: 115/62 mmHg. Respiratory normal breathing without difficulty. Psychiatric this patient is able to make decisions and demonstrates good insight into disease process. Alert and Oriented x 3. pleasant and cooperative. General Notes: Patient's wound bed currently did require some sharp debridement to clear away some of the dry eschar and blood on the surface of the wound. He tolerated this today without complication there was a small opening still remaining underneath but fortunately he seems to be in general doing quite well. His edema is well controlled the Curlex and Coban wrap is doing well for him. Integumentary (Hair, Skin) Wound #3 status is Open. Original cause of wound was Gradually Appeared. The wound is located on the Right,Anterior Lower Leg. The wound measures 1cm length x 0.2cm width x 0.1cm depth; 0.157cm^2 area and 0.016cm^3 volume. There is Fat Layer (Subcutaneous Tissue) Exposed exposed. There is no tunneling or undermining noted. There is a large amount of serosanguineous drainage noted. The wound margin is flat and intact. There is medium (34-66%) pink, pale granulation within the wound bed. There is a medium (34-66%) amount of necrotic tissue within the wound bed including Eschar and Adherent Slough. Wound #4 status is Open. Original cause of wound was Trauma. The wound is located on the Right,Medial Forearm. The wound measures 1.3cm length x 1.4cm width x 0.1cm depth; 1.429cm^2 area and 0.143cm^3 volume.  There is Fat Layer (Subcutaneous Tissue) Exposed exposed. There is no tunneling or undermining noted. There is a medium amount of serosanguineous drainage noted. There is medium (34-66%) red granulation within the wound bed. There is a medium (34- 66%) amount of necrotic tissue within the wound bed including Adherent Slough. Wound #5 status is Open. Original cause of wound was Trauma. The wound is located on the Right,Lateral Forearm. The wound measures 1.1cm length x 1.7cm width x 0.1cm depth; 1.469cm^2 area and 0.147cm^3 volume. There is Fat Layer (Subcutaneous Tissue) Exposed exposed. There is no tunneling or undermining noted. There is a medium amount of serosanguineous drainage noted. There is medium (34-66%) red granulation within the wound bed. There is a medium (34- 66%) amount of necrotic tissue within the wound bed including Adherent Slough. Assessment TYEE, HOGGARD ([id number]) Active Problems ICD-10 Non-pressure chronic ulcer of other part of right lower leg limited to breakdown of skin Chronic venous hypertension (idiopathic) with ulcer and inflammation of right lower extremity Laceration without foreign body of right upper arm, subsequent encounter Procedures Wound #3 Pre-procedure diagnosis of Wound #3 is a Venous  Leg Ulcer located on the Right,Anterior Lower Leg .Severity of Tissue Pre Debridement is: Fat layer exposed. There was a Excisional Skin/Subcutaneous Tissue Debridement with a total area of 0.2 sq cm performed by STONE III, HOYT E., PA-C. With the following instrument(s): Curette to remove Viable and Non-Viable tissue/material. Material removed includes Eschar and Subcutaneous Tissue and after achieving pain control using Lidocaine. No specimens were taken. A time out was conducted at 14:28, prior to the start of the procedure. A Minimum amount of bleeding was controlled with Pressure. The procedure was tolerated well. Post Debridement Measurements: 1cm length x 0.2cm  width x 0.2cm depth; 0.031cm^3 volume. Character of Wound/Ulcer Post Debridement is stable. Severity of Tissue Post Debridement is: Fat layer exposed. Post procedure Diagnosis Wound #3: Same as Pre-Procedure Plan Wound Cleansing: Wound #3 Right,Anterior Lower Leg: Cleanse wound with mild soap and water May shower with protection. - DO not get dressing wet No tub bath. Wound #4 Right,Medial Forearm: Cleanse wound with mild soap and water May shower with protection. - DO not get dressing wet No tub bath. Wound #5 Right,Lateral Forearm: Cleanse wound with mild soap and water May shower with protection. - DO not get dressing wet No tub bath. Skin Barriers/Peri-Wound Care: Wound #3 Right,Anterior Lower Leg: Barrier cream Primary Wound Dressing: Wound #3 Right,Anterior Lower Leg: Mepitel One Contact layer Wound #4 Right,Medial Forearm: Xeroform Wound #5 Right,Lateral Forearm: Xeroform Secondary Dressing: Wound #3 Right,Anterior Lower Leg: Dry Gauze RAJESH, DHALIWAL ([id number]) Wound #4 Right,Medial Forearm: Dry Gauze Conform/Kerlix - Stretch net to secure Wound #5 Right,Lateral Forearm: Dry Gauze Conform/Kerlix - Stretch net to secure Dressing Change Frequency: Wound #3 Right,Anterior Lower Leg: Change dressing every week Other: - additional changes if needed Wound #4 Right,Medial Forearm: Change Dressing Monday, Wednesday, Friday Wound #5 Right,Lateral Forearm: Change Dressing Monday, Wednesday, Friday Follow-up Appointments: Wound #3 Right,Anterior Lower Leg: Return Appointment in 1 week. Nurse Visit as needed - Patient to call if wrap slides down leg. Edema Control: Wound #3 Right,Anterior Lower Leg: Kerlix and Coban - Right Lower Extremity - Unna to anchor Additional Orders / Instructions: Wound #3 Right,Anterior Lower Leg: Stop Smoking Increase protein intake. 1 my suggestion currently is can be that we go ahead and continue with the current wound care measures  which includes the Xeroform for the forearm ulcers. He is in agreement with this plan and I think that this will continue to do well no debridement was needed at the sites. 2. With regard to lower extremity ulcer I really think that the [facility] she is going to continue to get stuck to the wound site. For that reason I think just a nonadherent pad followed by continuing with the Curlex and Coban wrap would be the best thing to do at this location. 3. I still recommend the patient continue smoking cessation attempts. We will see patient back for reevaluation in 1 week here in the clinic. If anything worsens or changes patient will contact our office for additional recommendations. Electronic Signature(s) Signed: 11/04/2019 4:47:05 PM By: Worthy Keeler PA-C Entered By: Worthy Keeler on 11/04/2019 14:35:52 [name] ([id number]) -------------------------------------------------------------------------------- ROS/PFSH Details Patient Name: [name] Date of Service: 11/04/2019 1:45 PM Medical Record Number: [id number] Patient Account Number: [id number] Date of Birth/Sex: [birth year] (78 y.o. M) Treating RN: Cornell Barman Primary Care Provider: Lamonte Sakai Other Clinician: Referring Provider: Lamonte Sakai Treating Provider/Extender: Melburn Hake, HOYT Weeks in Treatment: 3 Label Progress Note Print Version as History and Physical for this encounter  Information Obtained From Patient Constitutional Symptoms (General Health) Complaints and Symptoms: Negative for: Fatigue; Fever; Chills; Marked Weight Change Medical History: Past Medical History Notes: Fluid swelling; HTN medicarion in past; appendicitis; hepatitis Respiratory Complaints and Symptoms: Negative for: Chronic or frequent coughs; Shortness of Breath Medical History: Positive for: Chronic Obstructive Pulmonary Disease (COPD) Negative for: Aspiration; Asthma; Pneumothorax; Sleep Apnea;  Tuberculosis Cardiovascular Complaints and Symptoms: Positive for: LE edema Negative for: Chest pain Medical History: Positive for: Arrhythmia - Bradycardia; Congestive Heart Failure; Hypertension; Peripheral Venous Disease Negative for: Angina; Coronary Artery Disease; Deep Vein Thrombosis; Hypotension; Myocardial Infarction; Peripheral Arterial Disease; Phlebitis; Vasculitis Psychiatric Complaints and Symptoms: Negative for: Anxiety; Claustrophobia Medical History: Negative for: Anorexia/bulimia; Confinement Anxiety Eyes Medical History: Positive for: Cataracts Negative for: Glaucoma; Optic Neuritis Ear/Nose/Mouth/Throat MALVIN, SELZ ([id number]) Medical History: Negative for: Chronic sinus problems/congestion Hematologic/Lymphatic Medical History: Negative for: Anemia; Hemophilia; Human Immunodeficiency Virus; Lymphedema; Sickle Cell Disease Gastrointestinal Medical History: Negative for: Cirrhosis ; Colitis; Crohnos Endocrine Medical History: Negative for: Type I Diabetes; Type II Diabetes Genitourinary Medical History: Negative for: End Stage Renal Disease Immunological Medical History: Negative for: Lupus Erythematosus; Raynaudos; Scleroderma Integumentary (Skin) Medical History: Negative for: History of Burn; History of pressure wounds Musculoskeletal Medical History: Positive for: Osteomyelitis - right shin Negative for: Gout; Rheumatoid Arthritis; Osteoarthritis Neurologic Medical History: Negative for: Dementia; Neuropathy; Quadriplegia; Paraplegia; Seizure Disorder Oncologic Medical History: Negative for: Received Chemotherapy; Received Radiation Past Medical History Notes: Skin Cancer HBO Extended History Items Eyes: Cataracts Immunizations Pneumococcal Vaccine: Received Pneumococcal Vaccination: NAYEF, BERRONES ([id number]) Implantable Devices No devices added Family and Social History Cancer: No; Diabetes: No; Heart Disease: Yes -  Mother,Siblings; Hypertension: Yes - Mother,Siblings; Kidney Disease: No; Lung Disease: No; Seizures: No; Stroke: No; Thyroid Problems: No; Current every day smoker; Marital Status - Married; Alcohol Use: Never; Drug Use: No History; Caffeine Use: Daily; Financial Concerns: No; Food, Clothing or Shelter Needs: No; Support System Lacking: No; Transportation Concerns: No Physician Affirmation I have reviewed and agree with the above information. Electronic Signature(s) Signed: 11/04/2019 4:47:05 PM By: Worthy Keeler PA-C Signed: 11/04/2019 5:12:33 PM By: Gretta Cool BSN, RN, CWS, Kim RN, BSN Entered By: Worthy Keeler on 11/04/2019 14:34:48 [name] ([id number]) -------------------------------------------------------------------------------- SuperBill Details Patient Name: [name] Date of Service: 11/04/2019 Medical Record Number: [id number] Patient Account Number: [id number] Date of Birth/Sex: 1941-10-19 (78 y.o. 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 S41.111D Laceration without foreign body of right upper arm, subsequent encounter Facility Procedures CPT4 Code Description: [id number] 11042 - DEB SUBQ TISSUE 20 SQ CM/< ICD-10 Diagnosis Description 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

## 2019-11-11 ENCOUNTER — Other Ambulatory Visit: Payer: Self-pay

## 2019-11-11 ENCOUNTER — Encounter: Payer: Medicare Other | Attending: Internal Medicine | Admitting: Internal Medicine

## 2019-11-11 DIAGNOSIS — Z859 Personal history of malignant neoplasm, unspecified: Secondary | ICD-10-CM | POA: Diagnosis not present

## 2019-11-11 DIAGNOSIS — R609 Edema, unspecified: Secondary | ICD-10-CM | POA: Insufficient documentation

## 2019-11-11 DIAGNOSIS — F17200 Nicotine dependence, unspecified, uncomplicated: Secondary | ICD-10-CM | POA: Insufficient documentation

## 2019-11-11 DIAGNOSIS — M1711 Unilateral primary osteoarthritis, right knee: Secondary | ICD-10-CM | POA: Insufficient documentation

## 2019-11-11 DIAGNOSIS — I4891 Unspecified atrial fibrillation: Secondary | ICD-10-CM | POA: Insufficient documentation

## 2019-11-11 DIAGNOSIS — Z7901 Long term (current) use of anticoagulants: Secondary | ICD-10-CM | POA: Insufficient documentation

## 2019-11-11 DIAGNOSIS — I10 Essential (primary) hypertension: Secondary | ICD-10-CM | POA: Diagnosis not present

## 2019-11-11 DIAGNOSIS — S41111D Laceration without foreign body of right upper arm, subsequent encounter: Secondary | ICD-10-CM | POA: Diagnosis not present

## 2019-11-11 DIAGNOSIS — I872 Venous insufficiency (chronic) (peripheral): Secondary | ICD-10-CM | POA: Insufficient documentation

## 2019-11-11 DIAGNOSIS — X58XXXD Exposure to other specified factors, subsequent encounter: Secondary | ICD-10-CM | POA: Insufficient documentation

## 2019-11-11 DIAGNOSIS — J449 Chronic obstructive pulmonary disease, unspecified: Secondary | ICD-10-CM | POA: Diagnosis not present

## 2019-11-11 DIAGNOSIS — L97811 Non-pressure chronic ulcer of other part of right lower leg limited to breakdown of skin: Secondary | ICD-10-CM | POA: Diagnosis not present

## 2019-11-11 DIAGNOSIS — I87331 Chronic venous hypertension (idiopathic) with ulcer and inflammation of right lower extremity: Secondary | ICD-10-CM | POA: Insufficient documentation

## 2019-11-11 NOTE — Progress Notes (Signed)
Stanley Marks ([id number]) Visit Report for 11/11/2019 Arrival Information Details Patient Name: Stanley Marks Date of Service: 11/11/2019 9:15 AM Medical Record Number: [id number] Patient Account Number: [id number] Date of Birth/Sex: [birth year] (78 y.o. M) Treating RN: Army Melia Primary Care Delta Pichon: Lamonte Sakai Other Clinician: Referring Winford Hehn: Lamonte Sakai Treating Shatonia Hoots/Extender: Tito Dine in Treatment: 4 Visit Information History Since Last Visit Added or deleted any medications: No Patient Arrived: Ambulatory Any new allergies or adverse reactions: No Arrival Time: 09:10 Had a fall or experienced change in No Accompanied By: self activities of daily living that may affect Transfer Assistance: None risk of falls: Patient Identification Verified: Yes Signs or symptoms of abuse/neglect since last visito No Patient Has Alerts: Yes Hospitalized since last visit: No Patient Alerts: Patient on Blood Thinner Has Dressing in Place as Prescribed: Yes Xarelto Pain Present Now: No Electronic Signature(s) Signed: 11/11/2019 11:52:14 AM By: Army Melia Entered By: Army Melia on 11/11/2019 09:10:21 Stanley Marks ([id number]) -------------------------------------------------------------------------------- Clinic Level of Care Assessment Details Patient Name: Stanley Marks Date of Service: 11/11/2019 9:15 AM Medical Record Number: [id number] Patient Account Number: [id number] Date of Birth/Sex: October 14, 1941 (78 y.o. M) Treating RN: Harold Barban Primary Care Zakry Caso: Lamonte Sakai Other Clinician: Referring Janeva Peaster: Lamonte Sakai Treating Giabella Duhart/Extender: Tito Dine in Treatment: 4 Clinic Level of Care Assessment Items TOOL 4 Quantity Score []  - Use when only an EandM is performed on FOLLOW-UP visit 0 ASSESSMENTS - Nursing Assessment / Reassessment X - Reassessment of Co-morbidities (includes updates in patient status) 1 10 X- 1  5 Reassessment of Adherence to Treatment Plan ASSESSMENTS - Wound and Skin Assessment / Reassessment []  - Simple Wound Assessment / Reassessment - one wound 0 X- 2 5 Complex Wound Assessment / Reassessment - multiple wounds []  - 0 Dermatologic / Skin Assessment (not related to wound area) ASSESSMENTS - Focused Assessment []  - Circumferential Edema Measurements - multi extremities 0 []  - 0 Nutritional Assessment / Counseling / Intervention []  - 0 Lower Extremity Assessment (monofilament, tuning fork, pulses) []  - 0 Peripheral Arterial Disease Assessment (using hand held doppler) ASSESSMENTS - Ostomy and/or Continence Assessment and Care []  - Incontinence Assessment and Management 0 []  - 0 Ostomy Care Assessment and Management (repouching, etc.) PROCESS - Coordination of Care X - Simple Patient / Family Education for ongoing care 1 15 []  - 0 Complex (extensive) Patient / Family Education for ongoing care []  - 0 Staff obtains Programmer, systems, Records, Test Results / Process Orders []  - 0 Staff telephones HHA, Nursing Homes / Clarify orders / etc []  - 0 Routine Transfer to another Facility (non-emergent condition) []  - 0 Routine Hospital Admission (non-emergent condition) []  - 0 New Admissions / Biomedical engineer / Ordering NPWT, Apligraf, etc. []  - 0 Emergency Hospital Admission (emergent condition) X- 1 10 Simple Discharge Coordination Stanley Marks ([id number]) []  - 0 Complex (extensive) Discharge Coordination PROCESS - Special Needs []  - Pediatric / Minor Patient Management 0 []  - 0 Isolation Patient Management []  - 0 Hearing / Language / Visual special needs []  - 0 Assessment of Community assistance (transportation, D/C planning, etc.) []  - 0 Additional assistance / Altered mentation []  - 0 Support Surface(s) Assessment (bed, cushion, seat, etc.) INTERVENTIONS - Wound Cleansing / Measurement []  - Simple Wound Cleansing - one wound 0 X- 2 5 Complex Wound  Cleansing - multiple wounds X- 1 5 Wound Imaging (photographs - any number of wounds) []  - 0 Wound Tracing (instead of photographs) X- 1 5 Simple Wound Measurement -  one wound []  - 0 Complex Wound Measurement - multiple wounds INTERVENTIONS - Wound Dressings X - Small Wound Dressing one or multiple wounds 2 10 []  - 0 Medium Wound Dressing one or multiple wounds []  - 0 Large Wound Dressing one or multiple wounds []  - 0 Application of Medications - topical []  - 0 Application of Medications - injection INTERVENTIONS - Miscellaneous []  - External ear exam 0 []  - 0 Specimen Collection (cultures, biopsies, blood, body fluids, etc.) []  - 0 Specimen(s) / Culture(s) sent or taken to Lab for analysis []  - 0 Patient Transfer (multiple staff / Civil Service fast streamer / Similar devices) []  - 0 Simple Staple / Suture removal (25 or less) []  - 0 Complex Staple / Suture removal (26 or more) []  - 0 Hypo / Hyperglycemic Management (close monitor of Blood Glucose) []  - 0 Ankle / Brachial Index (ABI) - do not check if billed separately X- 1 5 Vital Signs Stanley Marks ([id number]) Has the patient been seen at the hospital within the last three years: Yes Total Score: 95 Level Of Care: New/Established - Level 3 Electronic Signature(s) Signed: 11/11/2019 4:10:47 PM By: Harold Barban Entered By: Harold Barban on 11/11/2019 09:37:14 Stanley Marks ([id number]) -------------------------------------------------------------------------------- Encounter Discharge Information Details Patient Name: Stanley Marks Date of Service: 11/11/2019 9:15 AM Medical Record Number: [id number] Patient Account Number: [id number] Date of Birth/Sex: 1941/09/27 (78 y.o. M) Treating RN: Harold Barban Primary Care Mariane Burpee: Lamonte Sakai Other Clinician: Referring Gevork Ayyad: Lamonte Sakai Treating Lusine Corlett/Extender: Tito Dine in Treatment: 4 Encounter Discharge Information Items Discharge Condition:  Stable Ambulatory Status: Ambulatory Discharge Destination: Home Transportation: Private Auto Accompanied By: self Schedule Follow-up Appointment: Yes Clinical Summary of Care: Electronic Signature(s) Signed: 11/11/2019 4:10:47 PM By: Harold Barban Entered By: Harold Barban on 11/11/2019 09:41:34 Stanley Marks ([id number]) -------------------------------------------------------------------------------- Lower Extremity Assessment Details Patient Name: Stanley Marks Date of Service: 11/11/2019 9:15 AM Medical Record Number: [id number] Patient Account Number: [id number] Date of Birth/Sex: [birth year] (78 y.o. M) Treating RN: Army Melia Primary Care Ailed Defibaugh: Lamonte Sakai Other Clinician: Referring Antonis Lor: Lamonte Sakai Treating Natsha Guidry/Extender: Tito Dine in Treatment: 4 Edema Assessment Assessed: [Left: No] [Right: No] Edema: [Left: N] [Right: o] Calf Left: Right: Point of Measurement: 36 cm From Medial Instep cm 41.5 cm Ankle Left: Right: Point of Measurement: 12 cm From Medial Instep cm 25 cm Vascular Assessment Pulses: Dorsalis Pedis Palpable: [Right:Yes] Electronic Signature(s) Signed: 11/11/2019 11:52:14 AM By: Army Melia Entered By: Army Melia on 11/11/2019 09:14:18 Stanley Marks ([id number]) -------------------------------------------------------------------------------- Multi Wound Chart Details Patient Name: Stanley Marks Date of Service: 11/11/2019 9:15 AM Medical Record Number: [id number] Patient Account Number: [id number] Date of Birth/Sex: Jan 12, 1941 (78 y.o. 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] [4:Skin Tear] [5:Skin Tear] Date Acquired: [3:09/30/2019] [4:10/27/2019] [5:12/26/2018] Weeks of Treatment: [3:4] [4:2] [5:2] Wound Status: [3:Open] [4:Healed - Epithelialized] [5:Healed - Epithelialized] Measurements L x W x D [3:0x0x0] [4:0x0x0] [5:0x0x0] (cm) Area (cm) : [3:0] [4:0] [5:0] Volume (cm) : [3:0] [4:0] [5:0] % Reduction in Area: [3:100.00%] [4:100.00%] [5:100.00%] % Reduction in Volume: [3:100.00%] [4:100.00%] [5:100.00%] Classification: [3:Full Thickness Without Exposed  Support Structures] [4:Partial Thickness] [5:Partial Thickness] Treatment Notes Electronic Signature(s) Signed: 11/11/2019 4:48:24 PM By: Linton Ham MD Entered By: Linton Ham on 11/11/2019 11:53:05 Stanley Marks ([id number]) -------------------------------------------------------------------------------- Multi-Disciplinary Care Plan Details Patient Name: Stanley Marks Date of Service: 11/11/2019 9:15 AM Medical Record Number: [id number] Patient Account Number: [id number] Date of Birth/Sex: [birth year] (78 y.o. M) Treating RN: Harold Barban Primary Care Kamy Poinsett: Lamonte Sakai Other Clinician: Referring Emet Rafanan: Lamonte Sakai Treating Evanell Redlich/Extender: Tito Dine in Treatment: 4 Active Inactive Electronic Signature(s) Signed: 11/11/2019 4:10:47 PM By: Harold Barban Entered By: Harold Barban on 11/11/2019 09:43:55 Stanley Marks ([id number]) -------------------------------------------------------------------------------- Pain Assessment Details Patient Name: Stanley Marks Date of Service: 11/11/2019 9:15 AM Medical Record Number: [id number] Patient Account Number: [id number] Date of Birth/Sex: 09-17-1941 (78 y.o. M) Treating RN: Army Melia Primary Care Janelle Culton: Lamonte Sakai Other Clinician: Referring Hatcher Froning: Lamonte Sakai Treating Chisa Kushner/Extender: Tito Dine in Treatment: 4 Active Problems Location of Pain Severity and Description of Pain Patient Has Paino No Site Locations Pain Management and Medication Current Pain Management: Electronic Signature(s) Signed: 11/11/2019 11:52:14 AM By: Army Melia Entered By: Army Melia on 11/11/2019 09:10:25 Stanley Marks ([id number]) -------------------------------------------------------------------------------- Patient/Caregiver Education Details Patient Name: Stanley Marks Date of Service: 11/11/2019 9:15 AM Medical Record Number: [id number] Patient Account Number: [id number] Date of Birth/Gender: 1941/10/25 (78 y.o. M) Treating RN: Harold Barban Primary Care Physician: Lamonte Sakai Other Clinician: Referring Physician: Lamonte Sakai Treating Physician/Extender: Tito Dine in Treatment: 4 Education Assessment Education Provided To: Patient Education Topics Provided Wound/Skin Impairment: Handouts: Caring for Your Ulcer Methods: Demonstration, Explain/Verbal Responses: State content correctly Electronic Signature(s) Signed: 11/11/2019 4:10:47 PM By: Harold Barban Entered By: Harold Barban on 11/11/2019 09:35:16 Stanley Marks ([id number]) -------------------------------------------------------------------------------- Wound Assessment Details Patient Name: Stanley Marks Date of Service: 11/11/2019 9:15 AM Medical Record Number: [id number] Patient Account Number: [id number] Date of Birth/Sex: [birth year] (78 y.o. M) Treating RN: Army Melia Primary Care Kadasia Kassing: Lamonte Sakai Other Clinician: Referring Antoney Biven: Lamonte Sakai Treating Hiawatha Merriott/Extender: Tito Dine in Treatment: 4 Wound Status Wound Number: 3 Primary Etiology: Venous Leg Ulcer Wound Location: Right, Anterior Lower Leg Wound Status: Open Wounding  Event: Gradually Appeared Date Acquired: 09/30/2019 Weeks Of Treatment: 4 Clustered Wound: No Wound Measurements Length: (cm) Width: (cm) Depth: (cm) Area: (cm) Volume: (cm) 0 % Reduction in Area: 100% 0 % Reduction in Volume: 100% 0 0 0 Wound Description Full Thickness Without Exposed Support Classification: Structures Electronic Signature(s) Signed: 11/11/2019 11:52:14 AM By: Army Melia Entered By: Army Melia on 11/11/2019 09:15:42 Stanley Marks ([id number]) -------------------------------------------------------------------------------- Wound Assessment Details Patient Name: Stanley Marks Date of Service: 11/11/2019 9:15 AM Medical Record Number: [id number] Patient Account Number: [id number] Date of Birth/Sex: 1941/01/26 (78 y.o. M) Treating RN: Harold Barban Primary Care Loveah Like: Lamonte Sakai Other Clinician: Referring Deneane Stifter: Lamonte Sakai Treating Lynford Espinoza/Extender: Tito Dine in Treatment: 4 Wound Status Wound Number: 4 Primary Etiology: Skin Tear Wound Location: Right, Medial Forearm Wound Status: Healed - Epithelialized Wounding Event: Trauma Date Acquired: 10/27/2019 Weeks Of Treatment: 2 Clustered Wound: No Wound Measurements Length: (cm) 0 Width: (cm) 0 Depth: (cm) 0 Area: (cm) 0 Volume: (cm) 0 % Reduction in Area: 100% % Reduction in Volume: 100% Wound Description Classification: Partial Thickness Electronic Signature(s) Signed: 11/11/2019 4:10:47 PM By: Harold Barban Entered By: Harold Barban on 11/11/2019 09:39:20 Stanley Marks ([id number]) -------------------------------------------------------------------------------- Wound Assessment Details Patient Name: Stanley Marks Date of Service: 11/11/2019 9:15 AM Medical Record Number: [id number] Patient Account Number: [id number] Date of Birth/Sex: 06/30/1941 (78 y.o.  M) Treating RN: Harold Barban Primary Care Artie Takayama: Lamonte Sakai Other Clinician: Referring  Marge Vandermeulen: Lamonte Sakai Treating Fotios Amos/Extender: Tito Dine in Treatment: 4 Wound Status Wound Number: 5 Primary Etiology: Skin Tear Wound Location: Right, Lateral Forearm Wound Status: Healed - Epithelialized Wounding Event: Trauma Date Acquired: 12/26/2018 Weeks Of Treatment: 2 Clustered Wound: No Wound Measurements Length: (cm) 0 Width: (cm) 0 Depth: (cm) 0 Area: (cm) 0 Volume: (cm) 0 % Reduction in Area: 100% % Reduction in Volume: 100% Wound Description Classification: Partial Thickness Electronic Signature(s) Signed: 11/11/2019 4:10:47 PM By: Harold Barban Entered By: Harold Barban on 11/11/2019 09:39:21 Stanley Marks ([id number]) -------------------------------------------------------------------------------- Vitals Details Patient Name: Stanley Marks Date of Service: 11/11/2019 9:15 AM Medical Record Number: [id number] Patient Account Number: [id number] Date of Birth/Sex: Dec 20, 1940 (78 y.o. M) Treating RN: Army Melia Primary Care Luciana Cammarata: Lamonte Sakai Other Clinician: Referring Journe Hallmark: Lamonte Sakai Treating Magenta Schmiesing/Extender: Tito Dine in Treatment: 4 Vital Signs Time Taken: 09:10 Temperature (F): 97.8 Height (in): 74 Pulse (bpm): 68 Weight (lbs): 300 Respiratory Rate (breaths/min): 16 Body Mass Index (BMI): 38.5 Blood Pressure (mmHg): 121/81 Reference Range: 80 - 120 mg / dl Electronic Signature(s) Signed: 11/11/2019 11:52:14 AM By: Army Melia Entered By: Army Melia on 11/11/2019 09:11:15

## 2019-11-11 NOTE — Progress Notes (Signed)
REI, HABERKORN ([id number]) Visit Report for 11/11/2019 HPI Details Patient Name: Stanley Marks, Stanley Marks Date of Service: 11/11/2019 9:15 AM Medical Record Number: [id number] Patient Account Number: [id number] Date of Birth/Sex: 1941-08-05 (78 y.o. M) Treating RN: Harold Barban Primary Care Provider: Lamonte Sakai Other Clinician: Referring Provider: Lamonte Sakai Treating Provider/Extender: Tito Dine in Treatment: 4 History of Present Illness HPI Description: Pleasant 78 year old with history of chronic venous insufficiency and hypertension. No History of diabetes or peripheral arterial disease. He says that he stopped taking his "fluid pill" so that he wouldn't have to urinate as often. He subsequently developed bilateral lower extremity swelling and left calf ulcerations in late August 2016. Status post bilateral lower extremity endovenous ablation over 5 years ago in [place]. He does not regularly wear compression stockings. Denies any history of DVT. He is ambulating per his baseline. No claudication or rest pain. Tolerated 3 layer compression bandage with Prisma with significant improvement. Recently declined compression bandages and has been using a Tubigrip for edema control. He returns to clinic for follow-up and is without complaints. No significant pain. No fever or chills. No drainage. READMISSION 10/14/2019 This is a 78 year old man who is in this clinic for years ago with a venous insufficiency wound on the left leg. He was discharged with compression stockings but he does not wear them. He states he developed a blister on the right anterior tibial area in October. He was in the ER on 10/26 after falling and noted to have a wound on his right leg/shin although the patient states the blister was already present. He was felt possibly to be mildly dehydrated he declined IV fluid. X-ray did not show any osseous abnormalities however he was noted to have severe right knee  osteoarthritis. The patient has known chronic venous insufficiency. He states he had ablations in his bilateral lower legs many years ago. This seems well verified from the history noted above. He is not a diabetic and does not have known PAD. Past medical history; no diabetes, atrial fibrillation on Xarelto, history of basal cell cancer, COPD continued smoker, hypertension, peripheral vascular disease. ABIs in our clinic were 1.36 on the right and 1.24 on the left 11/11; patient with a venous insufficiency wound on the right anterior lower leg. Admitted to clinic last week. He complains bitterly about the 3 layer compression we put on him stating it was uncomfortable he could not sleep. He has previously not tolerated stockings very well as well. His ABIs in this clinic were noncompressible however his dorsalis pedis and posterior tibial pulses are easily palpable. He has decent edema control. We used Hydrofera Blue which apparently stuck to the wound. There are good improvements in wound dimensions 11/18; the patient's venous insufficiency wound on the right anterior lower leg is almost all epithelialized. However he arrives in the clinic with 2 skin tears on his forearm from a fall at noon yesterday 11/04/2019 on evaluation today patient appears to be doing well with regard to his leg ulcer as well as his right forearm ulcers. He has been tolerating the dressing changes without complication. Fortunately there is no sign of active infection at this time. No fevers, chills, nausea, vomiting, or diarrhea. I am extremely pleased with how things seem to be progressing based on what I am seeing today. 12/2; the patient's wound on the right anterior lower leg is healed. He has severe chronic venous insufficiency and has had previous ablations on this side. Previous ablations were done in [place]. He  has not consistently worn compression stockings. He has a Tubigrip stocking on his left leg. WAFI, SIMOES ([id number]) The areas that he injured on his right dorsal forearm have some surface scabbing however I think this is on its way to closure I do not think independently they need to have him continue in this clinic. Electronic Signature(s) Signed: 11/11/2019 4:48:24 PM By: Linton Ham MD Entered By: Linton Ham on 11/11/2019 11:55:14 Stanley Marks ([id number]) -------------------------------------------------------------------------------- Physical Exam Details Patient Name: Stanley Marks Date of Service: 11/11/2019 9:15 AM Medical Record Number: [id number] Patient Account Number: [id number] Date of Birth/Sex: 25-Oct-1941 (78 y.o. M) Treating RN: Harold Barban Primary Care Provider: Lamonte Sakai Other Clinician: Referring Provider: Lamonte Sakai Treating Provider/Extender: Tito Dine in Treatment: 4 Notes 12/2; patient's wound exam revealed a healed area on his right anterior tibia. The patient has decent edema control under our compression. On the right arm he has some eschar. I do not think this independently needs him to come back to our clinic. Electronic Signature(s) Signed: 11/11/2019 4:48:24 PM By: Linton Ham MD Entered By: Linton Ham on 11/11/2019 12:00:00 Stanley Marks ([id number]) -------------------------------------------------------------------------------- Physician Orders Details Patient Name: Stanley Marks Date of Service: 11/11/2019 9:15 AM Medical Record Number: [id number] Patient Account Number: [id number] Date of Birth/Sex: 01/18/1941 (78 y.o. M) Treating RN: Harold Barban Primary Care Provider: Lamonte Sakai Other Clinician: Referring Provider: Lamonte Sakai Treating Provider/Extender: Tito Dine in Treatment: 4 Verbal / Phone Orders: No Diagnosis Coding Discharge From [facility] o Discharge from [place] - Please wear compression hose daily. Call with any questions. Thank you Electronic  Signature(s) Signed: 11/11/2019 4:10:47 PM By: Harold Barban Signed: 11/11/2019 4:48:24 PM By: Linton Ham MD Entered By: Harold Barban on 11/11/2019 09:40:13 Stanley Marks ([id number]) -------------------------------------------------------------------------------- Problem List Details Patient Name: Stanley Marks Date of Service: 11/11/2019 9:15 AM Medical Record Number: [id number] Patient Account Number: [id number] Date of Birth/Sex: 1941-04-15 (78 y.o. M) Treating RN: Harold Barban Primary Care Provider: Lamonte Sakai Other Clinician: Referring Provider: Lamonte Sakai Treating Provider/Extender: Tito Dine in Treatment: 4 Active Problems ICD-10 Evaluated Encounter Code Description Active Date Today Diagnosis L97.811 Non-pressure chronic ulcer of other part of right lower leg 10/14/2019 No Yes limited to breakdown of skin I87.331 Chronic venous hypertension (idiopathic) with ulcer and 10/14/2019 No Yes inflammation of right lower extremity S41.111D Laceration without foreign body of right upper arm, 10/28/2019 No Yes subsequent encounter Inactive Problems Resolved Problems Electronic Signature(s) Signed: 11/11/2019 4:48:24 PM By: Linton Ham MD Entered By: Linton Ham on 11/11/2019 11:52:51 Stanley Marks ([id number]) -------------------------------------------------------------------------------- Progress Note Details Patient Name: Stanley Marks Date of Service: 11/11/2019 9:15 AM Medical Record Number: [id number] Patient Account Number: [id number] Date of Birth/Sex: [birth year] (78 y.o. M) Treating RN: Harold Barban Primary Care Provider: Lamonte Sakai Other Clinician: Referring Provider: Lamonte Sakai Treating Provider/Extender: Tito Dine in Treatment: 4 Subjective History of Present Illness (HPI) Pleasant 78 year old with history of chronic venous insufficiency and hypertension. No History of diabetes or peripheral arterial  disease. He says that he stopped taking his "fluid pill" so that he wouldn't have to urinate as often. He subsequently developed bilateral lower extremity swelling and left calf ulcerations in late August 2016. Status post bilateral lower extremity endovenous ablation over 5 years ago in [place]. He does not regularly wear compression stockings. Denies any history of DVT. He is ambulating per his baseline. No claudication or rest pain. Tolerated 3 layer compression bandage with Prisma with significant improvement. Recently  declined compression bandages and has been using a Tubigrip for edema control. He returns to clinic for follow-up and is without complaints. No significant pain. No fever or chills. No drainage. READMISSION 10/14/2019 This is a 78 year old man who is in this clinic for years ago with a venous insufficiency wound on the left leg. He was discharged with compression stockings but he does not wear them. He states he developed a blister on the right anterior tibial area in October. He was in the ER on 10/26 after falling and noted to have a wound on his right leg/shin although the patient states the blister was already present. He was felt possibly to be mildly dehydrated he declined IV fluid. X-ray did not show any osseous abnormalities however he was noted to have severe right knee osteoarthritis. The patient has known chronic venous insufficiency. He states he had ablations in his bilateral lower legs many years ago. This seems well verified from the history noted above. He is not a diabetic and does not have known PAD. Past medical history; no diabetes, atrial fibrillation on Xarelto, history of basal cell cancer, COPD continued smoker, hypertension, peripheral vascular disease. ABIs in our clinic were 1.36 on the right and 1.24 on the left 11/11; patient with a venous insufficiency wound on the right anterior lower leg. Admitted to clinic last week. He complains bitterly  about the 3 layer compression we put on him stating it was uncomfortable he could not sleep. He has previously not tolerated stockings very well as well. His ABIs in this clinic were noncompressible however his dorsalis pedis and posterior tibial pulses are easily palpable. He has decent edema control. We used Hydrofera Blue which apparently stuck to the wound. There are good improvements in wound dimensions 11/18; the patient's venous insufficiency wound on the right anterior lower leg is almost all epithelialized. However he arrives in the clinic with 2 skin tears on his forearm from a fall at noon yesterday 11/04/2019 on evaluation today patient appears to be doing well with regard to his leg ulcer as well as his right forearm ulcers. He has been tolerating the dressing changes without complication. Fortunately there is no sign of active infection at this time. No fevers, chills, nausea, vomiting, or diarrhea. I am extremely pleased with how things seem to be progressing based on what I am seeing today. 12/2; the patient's wound on the right anterior lower leg is healed. He has severe chronic venous insufficiency and has had previous ablations on this side. Previous ablations were done in [place]. He has not consistently worn compression stockings. He has a Tubigrip stocking on his left leg. The areas that he injured on his right dorsal forearm have some surface scabbing however I think this is on its way to closure I do not think independently they need to have him continue in this clinic. BLAYDON, BRENNA ([id number]) Objective Constitutional Vitals Time Taken: 9:10 AM, Height: 74 in, Weight: 300 lbs, BMI: 38.5, Temperature: 97.8 F, Pulse: 68 bpm, Respiratory Rate: 16 breaths/min, Blood Pressure: 121/81 mmHg. Integumentary (Hair, Skin) Wound #3 status is Open. Original cause of wound was Gradually Appeared. The wound is located on the Right,Anterior Lower Leg. The wound measures 0cm length  x 0cm width x 0cm depth; 0cm^2 area and 0cm^3 volume. Wound #4 status is Healed - Epithelialized. Original cause of wound was Trauma. The wound is located on the Right,Medial Forearm. The wound measures 0cm length x 0cm width x 0cm depth; 0cm^2 area and 0cm^3  volume. Wound #5 status is Healed - Epithelialized. Original cause of wound was Trauma. The wound is located on the Right,Lateral Forearm. The wound measures 0cm length x 0cm width x 0cm depth; 0cm^2 area and 0cm^3 volume. 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 Call with any questions. Thank you 1. Patient has severe venous insufficiency. We have given him his measurements and instructions to call elastic therapy to obtain graded pressure stockings. I am not overtly confident he will comply with this. 2. He has had previous ablation surgery done in [place]. I did not repeat reflux studies here. 3. He can be discharged from the clinic. I had previously suggested simply using Polysporin and covering gauze on his arm which I think is well on its way to healing. By itself I do not think that is a reason to continue to bring him back here KIRKLIN, BARNO ([id number]) Electronic Signature(s) Signed: 11/11/2019 4:48:24 PM By: Linton Ham MD Entered By: Linton Ham on 11/11/2019 12:02:17 Stanley Marks ([id number]) -------------------------------------------------------------------------------- SuperBill Details Patient Name: Stanley Marks Date of Service: 11/11/2019 Medical Record Number: [id number] Patient Account Number: [id number] Date of Birth/Sex: [birth year] (78 y.o. M) Treating  RN: Harold Barban Primary Care Provider: Lamonte Sakai Other Clinician: Referring Provider: Lamonte Sakai Treating Provider/Extender: Tito Dine in Treatment: 4 Diagnosis Coding ICD-10 Codes Code Description L97.811 Non-pressure chronic ulcer of other part of right lower leg limited to breakdown of skin I87.331 Chronic venous hypertension (idiopathic) with ulcer and inflammation of right lower extremity S41.111D Laceration without foreign body of right upper arm, subsequent encounter Facility Procedures CPT4 Code: [id number] Description: 99213 - WOUND CARE VISIT-LEV 3 EST PT Modifier: Quantity: 1 Physician Procedures CPT4: Description Modifier Quantity Code [id number] - WC PHYS LEVEL 2 - EST PT 1 ICD-10 Diagnosis Description L97.811 Non-pressure chronic ulcer of other part of right lower leg limited to breakdown of skin I87.331 Chronic venous hypertension  (idiopathic) with ulcer and inflammation of right lower extremity S41.111D Laceration without foreign body of right upper arm, subsequent encounter Electronic Signature(s) Signed: 11/11/2019 4:48:24 PM By: Linton Ham MD Entered By: Linton Ham on 11/11/2019 12:02:37

## 2019-11-18 ENCOUNTER — Encounter: Payer: Medicare Other | Admitting: Internal Medicine

## 2019-12-16 ENCOUNTER — Emergency Department: Payer: Medicare Other

## 2019-12-16 ENCOUNTER — Other Ambulatory Visit: Payer: Self-pay

## 2019-12-16 ENCOUNTER — Emergency Department
Admission: EM | Admit: 2019-12-16 | Discharge: 2019-12-16 | Disposition: A | Discharge status: Home / Self Care | Payer: Medicare Other | Attending: Student | Admitting: Student

## 2019-12-16 DIAGNOSIS — W1839XA Other fall on same level, initial encounter: Secondary | ICD-10-CM | POA: Insufficient documentation

## 2019-12-16 DIAGNOSIS — I739 Peripheral vascular disease, unspecified: Secondary | ICD-10-CM | POA: Diagnosis not present

## 2019-12-16 DIAGNOSIS — Y9389 Activity, other specified: Secondary | ICD-10-CM | POA: Insufficient documentation

## 2019-12-16 DIAGNOSIS — Y92099 Unspecified place in other non-institutional residence as the place of occurrence of the external cause: Secondary | ICD-10-CM | POA: Diagnosis not present

## 2019-12-16 DIAGNOSIS — F17200 Nicotine dependence, unspecified, uncomplicated: Secondary | ICD-10-CM | POA: Diagnosis not present

## 2019-12-16 DIAGNOSIS — I10 Essential (primary) hypertension: Secondary | ICD-10-CM | POA: Insufficient documentation

## 2019-12-16 DIAGNOSIS — M25552 Pain in left hip: Secondary | ICD-10-CM

## 2019-12-16 DIAGNOSIS — W19XXXA Unspecified fall, initial encounter: Secondary | ICD-10-CM

## 2019-12-16 DIAGNOSIS — Y999 Unspecified external cause status: Secondary | ICD-10-CM | POA: Diagnosis not present

## 2019-12-16 DIAGNOSIS — Y92009 Unspecified place in unspecified non-institutional (private) residence as the place of occurrence of the external cause: Secondary | ICD-10-CM

## 2019-12-16 MED ORDER — TRAMADOL HCL 50 MG PO TABS: 50.0000 mg | ORAL_TABLET | Freq: Three times a day (TID) | ORAL | 0 refills | Status: AC | PRN

## 2019-12-16 MED ORDER — TRAMADOL HCL 50 MG PO TABS
50.0000 mg | ORAL_TABLET | Freq: Once | ORAL | Status: AC
  Administered 2019-12-16: 20:00:00 50 mg via ORAL
  Filled 2019-12-16: qty 1

## 2019-12-16 MED ORDER — HYDROCODONE-ACETAMINOPHEN 5-325 MG PO TABS
1.0000 | ORAL_TABLET | Freq: Once | ORAL | Status: DC
  Filled 2019-12-16: qty 1

## 2019-12-16 NOTE — Discharge Instructions (Signed)
Your exam and XR are normal at this time. Take the prescription meds as needed for pain. Follow-up with Dr. Humphrey Rolls for ongoing pain.

## 2019-12-16 NOTE — ED Provider Notes (Signed)
[facility] Emergency Department Provider Note ____________________________________________  Time seen: 1939  I have reviewed the triage vital signs and the nursing notes.  HISTORY  Chief Complaint  Hip Pain  HPI Stanley Marks is a 79 y.o. male presents to the ED via EMS from home, with complaints of left hip pain.  Patient describes he lost his balance and fell on Christmas Eve, and since that time is had persistent left hip pain.  He describes pain is worse with sitting.  He denies any other injury at this time.   Past Medical History:  Diagnosis Date  . Cancer ([place])    skin  . Emphysema of lung ([place])   . Hypertension   . PVD (peripheral vascular disease) [facility])     Patient Active Problem List   Diagnosis Date Noted  . Influenza A 01/28/2017  . Pneumonia 01/28/2017  . COPD exacerbation ([place]) 01/28/2017  . Tobacco abuse counseling 01/28/2017  . Patient's noncompliance with other medical treatment and regimen 01/28/2017  . Emphysema of lung ([place]) 01/28/2017  . Leukocytosis 01/28/2017  . Chronic systolic CHF (congestive heart failure) ([place]) 01/28/2017  . Bradycardia 01/28/2017  . Acute and chronic respiratory failure with hypoxia ([place]) 01/26/2017  . Obstructive sleep apnea syndrome 03/01/2016  . Personal history of other malignant neoplasm of skin 12/08/2014    Past Surgical History:  Procedure Laterality Date  . APPENDECTOMY    . arm surgery    . ELECTROPHYSIOLOGIC STUDY N/A 11/20/2016   Procedure: CARDIOVERSION;  Surgeon: Dionisio David, MD;  Location: [facility] ORS;  Service: Cardiovascular;  Laterality: N/A;    Prior to Admission medications   Medication Sig Start Date End Date Taking? Authorizing Provider  budesonide-formoterol (SYMBICORT) 160-4.5 MCG/ACT inhaler Inhale 1 puff into the lungs.     [provider]  digoxin (LANOXIN) 0.125 MG tablet Take 0.125 mg by mouth daily.    [provider]  metolazone (ZAROXOLYN) 5 MG  tablet Take by mouth.    [provider]  Rivaroxaban (XARELTO) 15 MG TABS tablet Take 15 mg by mouth daily with supper.    [provider]  sacubitril-valsartan (ENTRESTO) 49-51 MG Take 1 tablet by mouth 2 (two) times daily.    [provider]  spironolactone (ALDACTONE) 25 MG tablet Take 25 mg by mouth daily.    [provider]  TOPROL XL 25 MG 24 hr tablet  12/19/18   [provider]  torsemide (DEMADEX) 20 MG tablet Take 20 mg by mouth daily.    [provider]  traMADol (ULTRAM) 50 MG tablet Take 1 tablet (50 mg total) by mouth 3 (three) times daily as needed for up to 5 days. 12/16/19 12/21/19  Kavya Haag, Dannielle Karvonen, PA-C    Allergies Lipitor [atorvastatin calcium]  History reviewed. No pertinent family history.  Social History Social History   Tobacco Use  . Smoking status: Current Every Day Smoker    Packs/day: 0.00  . Smokeless tobacco: Never Used  Substance Use Topics  . Alcohol use: No  . Drug use: Not on file    Review of Systems  Constitutional: Negative for fever. Eyes: Negative for visual changes. ENT: Negative for sore throat. Cardiovascular: Negative for chest pain. Respiratory: Negative for shortness of breath. Gastrointestinal: Negative for abdominal pain, vomiting and diarrhea. Genitourinary: Negative for dysuria. Musculoskeletal: Negative for back pain. Left hip pain Skin: Negative for rash. Neurological: Negative for headaches, focal weakness or numbness. ____________________________________________  PHYSICAL EXAM:  VITAL SIGNS:  ED Triage Vitals  Enc Vitals Group     BP 12/16/19 1902 (!) 105/91     Pulse Rate 12/16/19 1902 82     Resp 12/16/19 1902 18     Temp 12/16/19 1902 98 F (36.7 C)     Temp Source 12/16/19 1902 Oral     SpO2 12/16/19 1902 95 %     Weight 12/16/19 1903 300 lb (136.1 kg)     Height 12/16/19 1903 6\' 2"  (1.88 m)     Head Circumference --      Peak Flow --      Pain  Score 12/16/19 1903 10     Pain Loc --      Pain Edu? --      Excl. in [place]? --     Constitutional: Alert and oriented. Well appearing and in no distress. Head: Normocephalic and atraumatic. Eyes: Conjunctivae are normal.  Normal extraocular movements Neck: Supple. Nrmal ROM Cardiovascular: Normal rate, regular rhythm. Normal distal pulses. Respiratory: Normal respiratory effort. No wheezes/rales/rhonchi. Musculoskeletal: Normal spinal alignment without midline tenderness, spasm, deformity, or step-off.  Patient able to transition from sit to stand without assistance.  Tenderness to the lateral aspect of the hip without difficulty with range.  Nontender with normal range of motion in all extremities.  Neurologic:  Normal gait without ataxia. Normal speech and language. No gross focal neurologic deficits are appreciated. Skin:  Skin is warm, dry and intact. No rash noted. Psychiatric: Mood and affect are normal. Patient exhibits appropriate insight and judgment. ____________________________________________   LABS (pertinent positives/negatives) Labs Reviewed - No data to display ____________________________________________   RADIOLOGY  DG Left Hip w/ Pelvis IMPRESSION: No acute displaced fracture or dislocation. Moderate bilateral hip Osteoarthritis.  I, [name], personally viewed and evaluated these images (plain radiographs) as part of my medical decision making, as well as reviewing the written report by the radiologist. ____________________________________________  PROCEDURES  Ultram 50 mg PO Procedures ____________________________________________  INITIAL IMPRESSION / ASSESSMENT AND PLAN / ED COURSE  Patient with ED evaluation of injury sustained following mechanical fall at home 3 weeks prior.  Patient is with left hip pain without signs of fracture or dislocation.  Patient is exam is overall consistent with a contusion with underlying pain secondary to  chronic arthropathy.  He will be discharged with a prescription for Ultram to take as needed for pain.  Follow-up with primary provider return to the ED for ongoing symptoms.  Jael Atkerson was evaluated in Emergency Department on 12/17/2019 for the symptoms described in the history of present illness. He was evaluated in the context of the global COVID-19 pandemic, which necessitated consideration that the patient might be at risk for infection with the SARS-CoV-2 virus that causes COVID-19. Institutional protocols and algorithms that pertain to the evaluation of patients at risk for COVID-19 are in a state of rapid change based on information released by regulatory bodies including the CDC and federal and state organizations. These policies and algorithms were followed during the patient's care in the ED. ____________________________________________  FINAL CLINICAL IMPRESSION(S) / ED DIAGNOSES  Final diagnoses:  Left hip pain  Fall at home, initial encounter      [name], PA-C 12/17/19 0004    Lilia Pro., MD 12/19/19 [id number]

## 2019-12-16 NOTE — ED Triage Notes (Signed)
Pt states he lost his balance and tripped fell 12/24 and has been having left hip pain since, worse with sitting.

## 2019-12-16 NOTE — ED Triage Notes (Signed)
Pt in via EMS from home with c/o hip pain since Christmas Eve after a fall. Pain is to left hip and has gotten worse. Pain to palpation to left hip. Pt is ambulatory.

## 2019-12-21 ENCOUNTER — Encounter: Payer: Self-pay | Admitting: Emergency Medicine

## 2019-12-21 ENCOUNTER — Other Ambulatory Visit: Payer: Self-pay

## 2019-12-21 ENCOUNTER — Emergency Department
Admission: EM | Admit: 2019-12-21 | Discharge: 2019-12-21 | Disposition: A | Discharge status: Home / Self Care | Payer: Medicare Other | Source: Home / Self Care | Attending: Emergency Medicine | Admitting: Emergency Medicine

## 2019-12-21 ENCOUNTER — Emergency Department: Payer: Medicare Other

## 2019-12-21 DIAGNOSIS — Z85828 Personal history of other malignant neoplasm of skin: Secondary | ICD-10-CM | POA: Insufficient documentation

## 2019-12-21 DIAGNOSIS — I5022 Chronic systolic (congestive) heart failure: Secondary | ICD-10-CM | POA: Insufficient documentation

## 2019-12-21 DIAGNOSIS — J9601 Acute respiratory failure with hypoxia: Secondary | ICD-10-CM

## 2019-12-21 DIAGNOSIS — Z7901 Long term (current) use of anticoagulants: Secondary | ICD-10-CM | POA: Insufficient documentation

## 2019-12-21 DIAGNOSIS — J449 Chronic obstructive pulmonary disease, unspecified: Secondary | ICD-10-CM | POA: Insufficient documentation

## 2019-12-21 DIAGNOSIS — F17210 Nicotine dependence, cigarettes, uncomplicated: Secondary | ICD-10-CM | POA: Insufficient documentation

## 2019-12-21 DIAGNOSIS — U071 COVID-19: Secondary | ICD-10-CM | POA: Insufficient documentation

## 2019-12-21 DIAGNOSIS — Z8616 Personal history of COVID-19: Secondary | ICD-10-CM

## 2019-12-21 DIAGNOSIS — Z79899 Other long term (current) drug therapy: Secondary | ICD-10-CM | POA: Insufficient documentation

## 2019-12-21 DIAGNOSIS — I110 Hypertensive heart disease with heart failure: Secondary | ICD-10-CM | POA: Insufficient documentation

## 2019-12-21 HISTORY — DX: Personal history of COVID-19: Z86.16

## 2019-12-21 LAB — CBC
HCT: 49 % (ref 39.0–52.0)
Hemoglobin: 15.7 g/dL (ref 13.0–17.0)
MCH: 30.3 pg (ref 26.0–34.0)
MCHC: 32 g/dL (ref 30.0–36.0)
MCV: 94.6 fL (ref 80.0–100.0)
Platelets: 205 10*3/uL (ref 150–400)
RBC: 5.18 MIL/uL (ref 4.22–5.81)
RDW: 15.7 % — ABNORMAL HIGH (ref 11.5–15.5)
WBC: 13.6 10*3/uL — ABNORMAL HIGH (ref 4.0–10.5)
nRBC: 0 % (ref 0.0–0.2)

## 2019-12-21 LAB — BASIC METABOLIC PANEL
Anion gap: 7 (ref 5–15)
BUN: 15 mg/dL (ref 8–23)
CO2: 33 mmol/L — ABNORMAL HIGH (ref 22–32)
Calcium: 9 mg/dL (ref 8.9–10.3)
Chloride: 100 mmol/L (ref 98–111)
Creatinine, Ser: 1.26 mg/dL — ABNORMAL HIGH (ref 0.61–1.24)
GFR calc Af Amer: >60 mL/min (ref >60)
GFR calc non Af Amer: 55 mL/min — ABNORMAL LOW (ref >60)
Glucose, Bld: 131 mg/dL — ABNORMAL HIGH (ref 70–99)
Potassium: 4.2 mmol/L (ref 3.5–5.1)
Sodium: 140 mmol/L (ref 135–145)

## 2019-12-21 LAB — RESPIRATORY PANEL BY RT PCR (FLU A&B, COVID)
Influenza A by PCR: NEGATIVE
Influenza B by PCR: NEGATIVE
SARS Coronavirus 2 by RT PCR: POSITIVE — AB

## 2019-12-21 LAB — TROPONIN I (HIGH SENSITIVITY): Troponin I (High Sensitivity): 3 ng/L (ref <18)

## 2019-12-21 LAB — PROCALCITONIN: Procalcitonin: <0.1 ng/mL

## 2019-12-21 MED ORDER — PREDNISONE 20 MG PO TABS: 40.0000 mg | ORAL_TABLET | Freq: Every day | ORAL | 0 refills | Status: DC

## 2019-12-21 MED ORDER — IPRATROPIUM-ALBUTEROL 0.5-2.5 (3) MG/3ML IN SOLN
3.0000 mL | Freq: Once | RESPIRATORY_TRACT | Status: AC
  Administered 2019-12-21: 3 mL via RESPIRATORY_TRACT

## 2019-12-21 MED ORDER — AZITHROMYCIN 250 MG PO TABS: ORAL_TABLET | ORAL | 0 refills | Status: DC

## 2019-12-21 MED ORDER — IPRATROPIUM-ALBUTEROL 0.5-2.5 (3) MG/3ML IN SOLN
3.0000 mL | Freq: Once | RESPIRATORY_TRACT | Status: AC
  Administered 2019-12-21: 3 mL via RESPIRATORY_TRACT
  Filled 2019-12-21: qty 9

## 2019-12-21 MED ORDER — ALBUTEROL SULFATE HFA 108 (90 BASE) MCG/ACT IN AERS: 2.0000 | INHALATION_SPRAY | Freq: Four times a day (QID) | RESPIRATORY_TRACT | 1 refills | Status: DC | PRN

## 2019-12-21 MED ORDER — DOXYCYCLINE MONOHYDRATE 100 MG PO TABS: 100.0000 mg | ORAL_TABLET | Freq: Two times a day (BID) | ORAL | 0 refills | Status: DC

## 2019-12-21 MED ORDER — PREDNISONE 20 MG PO TABS
60.0000 mg | ORAL_TABLET | Freq: Once | ORAL | Status: AC
  Administered 2019-12-21: 60 mg via ORAL
  Filled 2019-12-21: qty 3

## 2019-12-21 MED ORDER — SODIUM CHLORIDE 0.9 % IV SOLN: 500.0000 mg | Freq: Once | INTRAVENOUS | Status: DC

## 2019-12-21 NOTE — ED Notes (Addendum)
Pt's breathing treatment complete. Pt still working to breath. Pt given water and O2 increased to 4L.

## 2019-12-21 NOTE — ED Notes (Signed)
Pt given portable oxygen tank and instructed on how to turn the tank off once all of the medication is gone. Pt turned oxygen tank off prior to completion. Pt stating he wants water instead. Pt informed that we need to get his oxygen level better first. Breathing treatment started again. Will continue to monitor.

## 2019-12-21 NOTE — ED Provider Notes (Signed)
[facility] Emergency Department Provider Note  ____________________________________________   First MD Initiated Contact with Patient 12/21/19 1637     (approximate)  I have reviewed the triage vital signs and the nursing notes.   HISTORY  Chief Complaint Shortness of Breath    HPI Stanley Marks is a 79 y.o. male with COPD who comes in for shortness of breath.   Patient satting 88% on room air and placed on 2 L.  Patient states that he has never been formally diagnosed with COPD but he has been smoking for 50 years.  He states his daughter-in-law has oxygen takes at home and has been needing to use them for the past month.  He presents today for worsening shortness of breath and cough.  Shortness of breath is worse with exertion, better at rest, has not been taking any albuterol to try to help, moderate in nature.  He denies any known coronavirus contacts.  Does have a low-grade temp here but denies having any fevers.  However patient was seen on 1/6 for hip pain and had a saturation then of 95%.          Past Medical History:  Diagnosis Date  . Cancer ([place])    skin  . Emphysema of lung ([place])   . Hypertension   . PVD (peripheral vascular disease) [facility])     Patient Active Problem List   Diagnosis Date Noted  . Influenza A 01/28/2017  . Pneumonia 01/28/2017  . COPD exacerbation ([place]) 01/28/2017  . Tobacco abuse counseling 01/28/2017  . Patient's noncompliance with other medical treatment and regimen 01/28/2017  . Emphysema of lung ([place]) 01/28/2017  . Leukocytosis 01/28/2017  . Chronic systolic CHF (congestive heart failure) ([place]) 01/28/2017  . Bradycardia 01/28/2017  . Acute and chronic respiratory failure with hypoxia ([place]) 01/26/2017  . Obstructive sleep apnea syndrome 03/01/2016  . Personal history of other malignant neoplasm of skin 12/08/2014    Past Surgical History:  Procedure Laterality Date  . APPENDECTOMY    . arm surgery    .  ELECTROPHYSIOLOGIC STUDY N/A 11/20/2016   Procedure: CARDIOVERSION;  Surgeon: Dionisio David, MD;  Location: [facility] ORS;  Service: Cardiovascular;  Laterality: N/A;    Prior to Admission medications   Medication Sig Start Date End Date Taking? Authorizing Provider  budesonide-formoterol (SYMBICORT) 160-4.5 MCG/ACT inhaler Inhale 1 puff into the lungs.     [provider]  digoxin (LANOXIN) 0.125 MG tablet Take 0.125 mg by mouth daily.    [provider]  metolazone (ZAROXOLYN) 5 MG tablet Take by mouth.    [provider]  Rivaroxaban (XARELTO) 15 MG TABS tablet Take 15 mg by mouth daily with supper.    [provider]  sacubitril-valsartan (ENTRESTO) 49-51 MG Take 1 tablet by mouth 2 (two) times daily.    [provider]  spironolactone (ALDACTONE) 25 MG tablet Take 25 mg by mouth daily.    [provider]  TOPROL XL 25 MG 24 hr tablet  12/19/18   [provider]  torsemide (DEMADEX) 20 MG tablet Take 20 mg by mouth daily.    [provider]  traMADol (ULTRAM) 50 MG tablet Take 1 tablet (50 mg total) by mouth 3 (three) times daily as needed for up to 5 days. 12/16/19 12/21/19  Menshew, Dannielle Karvonen, PA-C    Allergies Lipitor [atorvastatin calcium]  No family history on file.  Social History Social History   Tobacco Use  . Smoking  status: Current Every Day Smoker    Packs/day: 0.00  . Smokeless tobacco: Never Used  Substance Use Topics  . Alcohol use: No  . Drug use: Not on file      Review of Systems Constitutional: No fever/chills Eyes: No visual changes. ENT: No sore throat. Cardiovascular: No chest pain Respiratory: Positive for SOB, cough Gastrointestinal: No abdominal pain.  No nausea, no vomiting.  No diarrhea.  No constipation. Genitourinary: Negative for dysuria. Musculoskeletal: Negative for back pain. Skin: Negative for rash. Neurological: Negative for headaches, focal weakness or  numbness. All other ROS negative ____________________________________________   PHYSICAL EXAM:  VITAL SIGNS: ED Triage Vitals  Enc Vitals Group     BP 12/21/19 1337 109/60     Pulse Rate 12/21/19 1337 96     Resp 12/21/19 1337 (!) 22     Temp 12/21/19 1335 99.3 F (37.4 C)     Temp Source 12/21/19 1335 Oral     SpO2 12/21/19 1337 91 %     Weight 12/21/19 1336 300 lb (136.1 kg)     Height 12/21/19 1336 6\' 2"  (1.88 m)     Head Circumference --      Peak Flow --      Pain Score 12/21/19 1335 8     Pain Loc --      Pain Edu? --      Excl. in [place]? --     Constitutional: Alert and oriented. Well appearing and in no acute distress. Eyes: Conjunctivae are normal. EOMI. Head: Atraumatic. Nose: No congestion/rhinnorhea. Mouth/Throat: Mucous membranes are moist.   Neck: No stridor. Trachea Midline. FROM Cardiovascular: Normal rate, regular rhythm. Grossly normal heart sounds.  Good peripheral circulation. Respiratory: Increased work of breathing, bilateral wheezing Gastrointestinal: Soft and nontender. No distention. No abdominal bruits.  Musculoskeletal: No lower extremity tenderness nor edema.  No joint effusions. Neurologic:  Normal speech and language. No gross focal neurologic deficits are appreciated.  Skin:  Skin is warm, dry and intact. No rash noted. Psychiatric: Mood and affect are normal. Speech and behavior are normal. GU: Deferred   ____________________________________________   LABS (all labs ordered are listed, but only abnormal results are displayed)  Labs Reviewed  BASIC METABOLIC PANEL - Abnormal; Notable for the following components:      Result Value   CO2 33 (*)    Glucose, Bld 131 (*)    Creatinine, Ser 1.26 (*)    GFR calc non Af Amer 55 (*)    All other components within normal limits  CBC - Abnormal; Notable for the following components:   WBC 13.6 (*)    RDW 15.7 (*)    All other components within normal limits  RESPIRATORY PANEL BY RT PCR (FLU  A&B, COVID)  PROCALCITONIN  PROCALCITONIN  TROPONIN I (HIGH SENSITIVITY)  TROPONIN I (HIGH SENSITIVITY)   ____________________________________________   ED ECG REPORT I, Vanessa , the attending physician, personally viewed and interpreted this ECG.  EKG looks like A. fib rate of 99, no ST elevation, maybe some ST depression in V5 and V6, normal intervals ____________________________________________  RADIOLOGY Robert Bellow, personally viewed and evaluated these images (plain radiographs) as part of my medical decision making, as well as reviewing the written report by the radiologist.  ED MD interpretation: No focal consolidation  Official radiology report(s): DG Chest 2 View  Result Date: 12/21/2019 CLINICAL DATA:  Shortness of breath EXAM: CHEST - 2 VIEW COMPARISON:  March 05, 2019 chest radiograph  and chest CT FINDINGS: The interstitium is mildly thickened, stable. There is no appreciable edema or consolidation. Heart is upper normal in size with pulmonary vascularity normal. No adenopathy. There is degenerative change in the thoracic spine. IMPRESSION: Mild interstitial thickening which may be indicative of a degree of chronic bronchitis. No edema or airspace opacity evident. Stable cardiac silhouette. No evident adenopathy. Electronically Signed   By: Lowella Grip III M.D.   On: 12/21/2019 14:10    ____________________________________________   PROCEDURES  Procedure(s) performed (including Critical Care):  .Critical Care Performed by: Vanessa [place], MD Authorized by: Vanessa Beecher, MD   Critical care provider statement:    Critical care time (minutes):  35   Critical care was necessary to treat or prevent imminent or life-threatening deterioration of the following conditions:  Respiratory failure   Critical care was time spent personally by me on the following activities:  Discussions with consultants, evaluation of patient's response to treatment, examination  of patient, ordering and performing treatments and interventions, ordering and review of laboratory studies, ordering and review of radiographic studies, pulse oximetry, re-evaluation of patient's condition, obtaining history from patient or surrogate and review of old charts     ____________________________________________   INITIAL IMPRESSION / [place] / ED COURSE   Stanley Marks was evaluated in Emergency Department on 12/21/2019 for the symptoms described in the history of present illness. He was evaluated in the context of the global COVID-19 pandemic, which necessitated consideration that the patient might be at risk for infection with the SARS-CoV-2 virus that causes COVID-19. Institutional protocols and algorithms that pertain to the evaluation of patients at risk for COVID-19 are in a state of rapid change based on information released by regulatory bodies including the CDC and federal and state organizations. These policies and algorithms were followed during the patient's care in the ED.     Pt presents with SOB.  Given the bilateral wheezing this concerning for COPD versus coronavirus.  PNA-will get xray to evaluation Anemia-CBC to evaluate ACS- will get trops Arrhythmia-Will get EKG and keep on monitor.  PE-lower suspicion given no risk factors and other cause more likely and patient is on a blood thinner.  White count elevated at 13.6.   CR 1.26 around baseline   6:38 PM pt requesting to leave AMA. Understands risks including death and permanent disability.  Pt able to repeat this back to me.  Patient is not hypoxic while making this decision given he is on 4 L.  He says seems to have capacity.  He does not appear intoxicated.  We discuss ways to help him stay but he states there is nothing I can do he just wants to go home. He does have an oxygen tank at home.  He states he plans to go home and put the oxygen back on.  He has a friend that he already called to pick  him up.  We discussed that his breathing may get worse in the car ride and he could die and he states that he understands that.  We discuss he could have coronavirus and the benefits of coming to the hospital including antivirals and steroids.  We also discussed if it was COPD that we can give him more continuous breathing treatments and we could have it set up so he get oxygen that was his.  Patient states that he even with this that he does not want to stay.  He understands he needs to stay  quarantined at home due to his pending coronavirus testing.  He states that he will come back to the hospital if he gets worse.  He states that he may even come back tomorrow morning.  We will treat for COPD exacerbation given the wheezing.  Will not do azithro given on dig.  Will give doxy, prednisone, albuterol.        ____________________________________________   FINAL CLINICAL IMPRESSION(S) / ED DIAGNOSES   Final diagnoses:  Acute respiratory failure with hypoxia (HCC)  Chronic obstructive pulmonary disease, unspecified COPD type ([place])     MEDICATIONS GIVEN DURING THIS VISIT:  Medications  predniSONE (DELTASONE) tablet 60 mg (has no administration in time range)  ipratropium-albuterol (DUONEB) 0.5-2.5 (3) MG/3ML nebulizer solution 3 mL (3 mLs Nebulization Given 12/21/19 1720)  ipratropium-albuterol (DUONEB) 0.5-2.5 (3) MG/3ML nebulizer solution 3 mL (3 mLs Nebulization Given 12/21/19 1718)  ipratropium-albuterol (DUONEB) 0.5-2.5 (3) MG/3ML nebulizer solution 3 mL (3 mLs Nebulization Given 12/21/19 1718)     ED Discharge Orders         Ordered    albuterol (VENTOLIN HFA) 108 (90 Base) MCG/ACT inhaler  Every 6 hours PRN     12/21/19 1834    predniSONE (DELTASONE) 20 MG tablet  Daily with breakfast     12/21/19 1834    azithromycin (ZITHROMAX Z-PAK) 250 MG tablet  Status:  Discontinued     12/21/19 1834    doxycycline (ADOXA) 100 MG tablet  2 times daily     12/21/19 1836            Note:  This document was prepared using Dragon voice recognition software and may include unintentional dictation errors.   Vanessa Arnold, MD 12/21/19 1843

## 2019-12-21 NOTE — ED Notes (Signed)
Pt leaving AMA. Pt obviously having difficulty breathing and encouraged to stay for treatment. Pt wheeled to car and granddaughter informed that pt refusing treatment. This RN attempted without success to call pt and  pt's granddaughter to inform them of pt's COVID test results which were positive. Both numbers did not have voicemail set up.

## 2019-12-21 NOTE — Discharge Instructions (Signed)
You are leaving [place].  You may have COVID. Stay quarentined at home. Test are stilling pending. This could be COPD so we will treat you with steroids, antibiotic, and inhalers.  Use 4L at home> Come back to the ED if you decide you want additional care.

## 2019-12-21 NOTE — ED Notes (Signed)
Upon entering room pt found without nasal cannula in place. Pt stated it "bugs" him. RN place nasal cannula back on patient and increased O2 to 3L.

## 2019-12-21 NOTE — ED Triage Notes (Signed)
Pt to ED via [facility] from home for shortness of breath. Pt has hx/o COPD. Pt states that he has also been running fever. Pt is also having headache and nausea. Pt SpO2 88% on room air. Pt placed on 2 liter via [place]

## 2019-12-21 NOTE — ED Notes (Signed)
Pt left AMA after refusing any further treatment including vital signs. Pt attempted to sign signature pad but not captured.

## 2019-12-21 NOTE — ED Notes (Signed)
[place] sent to lab

## 2019-12-22 ENCOUNTER — Other Ambulatory Visit: Payer: Self-pay

## 2019-12-22 ENCOUNTER — Inpatient Hospital Stay
Admission: EM | Admit: 2019-12-22 | Discharge: 2019-12-22 | DRG: 177 | Disposition: A | Discharge status: Other Acute Inpatient Hospital | Payer: Medicare Other | Attending: Internal Medicine | Admitting: Internal Medicine

## 2019-12-22 ENCOUNTER — Encounter (HOSPITAL_COMMUNITY): Payer: Self-pay | Admitting: Internal Medicine

## 2019-12-22 ENCOUNTER — Inpatient Hospital Stay (HOSPITAL_COMMUNITY)
Admission: AD | Admit: 2019-12-22 | Discharge: 2019-12-26 | DRG: 177 | Disposition: A | Discharge status: Home / Self Care | Payer: Medicare Other | Source: Other Acute Inpatient Hospital | Attending: Internal Medicine | Admitting: Internal Medicine

## 2019-12-22 DIAGNOSIS — Z7901 Long term (current) use of anticoagulants: Secondary | ICD-10-CM

## 2019-12-22 DIAGNOSIS — Z79899 Other long term (current) drug therapy: Secondary | ICD-10-CM

## 2019-12-22 DIAGNOSIS — I130 Hypertensive heart and chronic kidney disease with heart failure and stage 1 through stage 4 chronic kidney disease, or unspecified chronic kidney disease: Secondary | ICD-10-CM | POA: Diagnosis present

## 2019-12-22 DIAGNOSIS — I110 Hypertensive heart disease with heart failure: Secondary | ICD-10-CM | POA: Diagnosis present

## 2019-12-22 DIAGNOSIS — Z7952 Long term (current) use of systemic steroids: Secondary | ICD-10-CM | POA: Diagnosis not present

## 2019-12-22 DIAGNOSIS — J9601 Acute respiratory failure with hypoxia: Secondary | ICD-10-CM | POA: Diagnosis present

## 2019-12-22 DIAGNOSIS — J9600 Acute respiratory failure, unspecified whether with hypoxia or hypercapnia: Secondary | ICD-10-CM

## 2019-12-22 DIAGNOSIS — I739 Peripheral vascular disease, unspecified: Secondary | ICD-10-CM | POA: Diagnosis present

## 2019-12-22 DIAGNOSIS — J1282 Pneumonia due to coronavirus disease 2019: Secondary | ICD-10-CM | POA: Diagnosis present

## 2019-12-22 DIAGNOSIS — Z888 Allergy status to other drugs, medicaments and biological substances status: Secondary | ICD-10-CM | POA: Diagnosis not present

## 2019-12-22 DIAGNOSIS — I4891 Unspecified atrial fibrillation: Secondary | ICD-10-CM

## 2019-12-22 DIAGNOSIS — Z7951 Long term (current) use of inhaled steroids: Secondary | ICD-10-CM

## 2019-12-22 DIAGNOSIS — J9610 Chronic respiratory failure, unspecified whether with hypoxia or hypercapnia: Secondary | ICD-10-CM | POA: Diagnosis present

## 2019-12-22 DIAGNOSIS — J9621 Acute and chronic respiratory failure with hypoxia: Secondary | ICD-10-CM | POA: Diagnosis present

## 2019-12-22 DIAGNOSIS — F17210 Nicotine dependence, cigarettes, uncomplicated: Secondary | ICD-10-CM | POA: Diagnosis present

## 2019-12-22 DIAGNOSIS — Z87891 Personal history of nicotine dependence: Secondary | ICD-10-CM

## 2019-12-22 DIAGNOSIS — U071 COVID-19: Secondary | ICD-10-CM | POA: Diagnosis present

## 2019-12-22 DIAGNOSIS — I5022 Chronic systolic (congestive) heart failure: Secondary | ICD-10-CM | POA: Diagnosis present

## 2019-12-22 DIAGNOSIS — J4489 Other specified chronic obstructive pulmonary disease: Secondary | ICD-10-CM

## 2019-12-22 DIAGNOSIS — J440 Chronic obstructive pulmonary disease with acute lower respiratory infection: Secondary | ICD-10-CM | POA: Diagnosis present

## 2019-12-22 DIAGNOSIS — I10 Essential (primary) hypertension: Secondary | ICD-10-CM | POA: Diagnosis present

## 2019-12-22 DIAGNOSIS — I482 Chronic atrial fibrillation, unspecified: Secondary | ICD-10-CM

## 2019-12-22 DIAGNOSIS — G4733 Obstructive sleep apnea (adult) (pediatric): Secondary | ICD-10-CM | POA: Diagnosis present

## 2019-12-22 DIAGNOSIS — J441 Chronic obstructive pulmonary disease with (acute) exacerbation: Secondary | ICD-10-CM | POA: Diagnosis present

## 2019-12-22 DIAGNOSIS — J449 Chronic obstructive pulmonary disease, unspecified: Secondary | ICD-10-CM

## 2019-12-22 DIAGNOSIS — Z66 Do not resuscitate: Secondary | ICD-10-CM | POA: Diagnosis present

## 2019-12-22 DIAGNOSIS — J22 Unspecified acute lower respiratory infection: Secondary | ICD-10-CM

## 2019-12-22 DIAGNOSIS — J439 Emphysema, unspecified: Secondary | ICD-10-CM | POA: Diagnosis present

## 2019-12-22 DIAGNOSIS — N1831 Chronic kidney disease, stage 3a: Secondary | ICD-10-CM | POA: Diagnosis present

## 2019-12-22 HISTORY — DX: Acute respiratory failure with hypoxia: J96.01

## 2019-12-22 HISTORY — DX: COVID-19: U07.1

## 2019-12-22 HISTORY — DX: Acute respiratory failure, unspecified whether with hypoxia or hypercapnia: J96.00

## 2019-12-22 LAB — TROPONIN I (HIGH SENSITIVITY)
Troponin I (High Sensitivity): 6 ng/L
Troponin I (High Sensitivity): 6 ng/L (ref <18)

## 2019-12-22 LAB — BASIC METABOLIC PANEL
Anion gap: 8 (ref 5–15)
BUN: 16 mg/dL (ref 8–23)
CO2: 29 mmol/L (ref 22–32)
Calcium: 8.7 mg/dL — ABNORMAL LOW (ref 8.9–10.3)
Chloride: 101 mmol/L (ref 98–111)
Creatinine, Ser: 1.21 mg/dL (ref 0.61–1.24)
GFR calc Af Amer: >60 mL/min (ref >60)
GFR calc non Af Amer: 58 mL/min — ABNORMAL LOW (ref >60)
Glucose, Bld: 183 mg/dL — ABNORMAL HIGH (ref 70–99)
Potassium: 4.1 mmol/L (ref 3.5–5.1)
Sodium: 138 mmol/L (ref 135–145)

## 2019-12-22 LAB — CBC
HCT: 46.6 % (ref 39.0–52.0)
Hemoglobin: 15.1 g/dL (ref 13.0–17.0)
MCH: 30.4 pg (ref 26.0–34.0)
MCHC: 32.4 g/dL (ref 30.0–36.0)
MCV: 93.8 fL (ref 80.0–100.0)
Platelets: 204 K/uL (ref 150–400)
RBC: 4.97 MIL/uL (ref 4.22–5.81)
RDW: 15.9 % — ABNORMAL HIGH (ref 11.5–15.5)
WBC: 18.9 K/uL — ABNORMAL HIGH (ref 4.0–10.5)
nRBC: 0 % (ref 0.0–0.2)

## 2019-12-22 LAB — ABO/RH
ABO/RH(D): O POS
ABO/RH(D): O POS

## 2019-12-22 LAB — HEPATIC FUNCTION PANEL
ALT: 18 U/L (ref 0–44)
AST: 19 U/L (ref 15–41)
Albumin: 3.1 g/dL — ABNORMAL LOW (ref 3.5–5.0)
Alkaline Phosphatase: 48 U/L (ref 38–126)
Bilirubin, Direct: 1.1 mg/dL — ABNORMAL HIGH (ref 0.0–0.2)
Indirect Bilirubin: 1.4 mg/dL — ABNORMAL HIGH (ref 0.3–0.9)
Total Bilirubin: 2.5 mg/dL — ABNORMAL HIGH (ref 0.3–1.2)
Total Protein: 7.1 g/dL (ref 6.5–8.1)

## 2019-12-22 LAB — TRIGLYCERIDES: Triglycerides: 53 mg/dL (ref <150)

## 2019-12-22 LAB — BRAIN NATRIURETIC PEPTIDE: B Natriuretic Peptide: 174 pg/mL — ABNORMAL HIGH (ref 0.0–100.0)

## 2019-12-22 LAB — C-REACTIVE PROTEIN: CRP: 20.1 mg/dL — ABNORMAL HIGH (ref <1.0)

## 2019-12-22 LAB — LACTATE DEHYDROGENASE: LDH: 142 U/L (ref 98–192)

## 2019-12-22 LAB — FIBRINOGEN: Fibrinogen: 750 mg/dL — ABNORMAL HIGH (ref 210–475)

## 2019-12-22 LAB — FIBRIN DERIVATIVES D-DIMER (ARMC ONLY): Fibrin derivatives D-dimer (ARMC): 683.51 ng/mL (FEU) — ABNORMAL HIGH (ref 0.00–499.00)

## 2019-12-22 LAB — FERRITIN: Ferritin: 237 ng/mL (ref 24–336)

## 2019-12-22 MED ORDER — SODIUM CHLORIDE 0.9 % IV SOLN: 200.0000 mg | Freq: Once | INTRAVENOUS | Status: DC

## 2019-12-22 MED ORDER — METOPROLOL SUCCINATE ER 100 MG PO TB24
100.0000 mg | ORAL_TABLET | Freq: Every day | ORAL | Status: DC
  Administered 2019-12-23 – 2019-12-24 (×2): 100 mg via ORAL
  Filled 2019-12-22 (×2): qty 1

## 2019-12-22 MED ORDER — GUAIFENESIN-DM 100-10 MG/5ML PO SYRP
10.0000 mL | ORAL_SOLUTION | ORAL | Status: DC | PRN
  Filled 2019-12-22: qty 10

## 2019-12-22 MED ORDER — ASCORBIC ACID 500 MG PO TABS: 500.0000 mg | ORAL_TABLET | Freq: Every day | ORAL | Status: DC

## 2019-12-22 MED ORDER — ALBUTEROL SULFATE HFA 108 (90 BASE) MCG/ACT IN AERS
4.0000 | INHALATION_SPRAY | RESPIRATORY_TRACT | Status: DC | PRN
  Filled 2019-12-22: qty 6.7

## 2019-12-22 MED ORDER — GUAIFENESIN-DM 100-10 MG/5ML PO SYRP: 10.0000 mL | ORAL_SOLUTION | ORAL | Status: DC | PRN

## 2019-12-22 MED ORDER — DEXAMETHASONE SODIUM PHOSPHATE 10 MG/ML IJ SOLN
6.0000 mg | INTRAMUSCULAR | Status: DC
  Administered 2019-12-23 – 2019-12-26 (×4): 6 mg via INTRAVENOUS
  Filled 2019-12-22 (×4): qty 1

## 2019-12-22 MED ORDER — ONDANSETRON HCL 4 MG/2ML IJ SOLN: 4.0000 mg | Freq: Four times a day (QID) | INTRAMUSCULAR | Status: DC | PRN

## 2019-12-22 MED ORDER — TOCILIZUMAB 400 MG/20ML IV SOLN
800.0000 mg | Freq: Once | INTRAVENOUS | Status: AC
  Administered 2019-12-22: 800 mg via INTRAVENOUS
  Filled 2019-12-22: qty 40

## 2019-12-22 MED ORDER — SODIUM CHLORIDE 0.9 % IV SOLN: 250.0000 mL | INTRAVENOUS | Status: DC | PRN

## 2019-12-22 MED ORDER — METOPROLOL SUCCINATE ER 50 MG PO TB24
100.0000 mg | ORAL_TABLET | Freq: Every day | ORAL | Status: DC
  Administered 2019-12-22: 100 mg via ORAL
  Filled 2019-12-22: qty 2

## 2019-12-22 MED ORDER — SODIUM CHLORIDE 0.9 % IV SOLN
200.0000 mg | Freq: Once | INTRAVENOUS | Status: AC
  Administered 2019-12-22: 200 mg via INTRAVENOUS
  Filled 2019-12-22: qty 200

## 2019-12-22 MED ORDER — METOPROLOL TARTRATE 5 MG/5ML IV SOLN: 5.0000 mg | INTRAVENOUS | Status: DC | PRN

## 2019-12-22 MED ORDER — ASCORBIC ACID 500 MG PO TABS
500.0000 mg | ORAL_TABLET | Freq: Every day | ORAL | Status: DC
  Administered 2019-12-23 – 2019-12-26 (×4): 500 mg via ORAL
  Filled 2019-12-22 (×4): qty 1

## 2019-12-22 MED ORDER — ALBUTEROL SULFATE HFA 108 (90 BASE) MCG/ACT IN AERS
2.0000 | INHALATION_SPRAY | Freq: Four times a day (QID) | RESPIRATORY_TRACT | Status: DC
  Administered 2019-12-22 (×2): 2 via RESPIRATORY_TRACT
  Filled 2019-12-22: qty 6.7

## 2019-12-22 MED ORDER — ONDANSETRON HCL 4 MG PO TABS: 4.0000 mg | ORAL_TABLET | Freq: Four times a day (QID) | ORAL | Status: DC | PRN

## 2019-12-22 MED ORDER — RIVAROXABAN 15 MG PO TABS
15.0000 mg | ORAL_TABLET | Freq: Every day | ORAL | Status: DC
  Administered 2019-12-22 – 2019-12-25 (×4): 15 mg via ORAL
  Filled 2019-12-22 (×6): qty 1

## 2019-12-22 MED ORDER — DEXAMETHASONE SODIUM PHOSPHATE 10 MG/ML IJ SOLN: 6.0000 mg | INTRAMUSCULAR | 0 refills | Status: DC

## 2019-12-22 MED ORDER — DEXAMETHASONE SODIUM PHOSPHATE 10 MG/ML IJ SOLN
10.0000 mg | Freq: Once | INTRAMUSCULAR | Status: AC
  Administered 2019-12-22: 10 mg via INTRAVENOUS
  Filled 2019-12-22: qty 1

## 2019-12-22 MED ORDER — SODIUM CHLORIDE 0.9 % IV SOLN: 100.0000 mg | Freq: Every day | INTRAVENOUS | Status: DC

## 2019-12-22 MED ORDER — ZINC SULFATE 220 (50 ZN) MG PO CAPS: 220.0000 mg | ORAL_CAPSULE | Freq: Every day | ORAL | Status: DC

## 2019-12-22 MED ORDER — ALBUTEROL SULFATE HFA 108 (90 BASE) MCG/ACT IN AERS
2.0000 | INHALATION_SPRAY | Freq: Four times a day (QID) | RESPIRATORY_TRACT | Status: DC | PRN
  Filled 2019-12-22: qty 6.7

## 2019-12-22 MED ORDER — TORSEMIDE 20 MG PO TABS
20.0000 mg | ORAL_TABLET | Freq: Every day | ORAL | Status: DC
  Administered 2019-12-22 – 2019-12-25 (×4): 20 mg via ORAL
  Filled 2019-12-22 (×5): qty 1

## 2019-12-22 MED ORDER — ASCORBIC ACID 500 MG PO TABS
500.0000 mg | ORAL_TABLET | Freq: Every day | ORAL | Status: DC
  Administered 2019-12-22: 500 mg via ORAL
  Filled 2019-12-22: qty 1

## 2019-12-22 MED ORDER — RIVAROXABAN 15 MG PO TABS: 15.0000 mg | ORAL_TABLET | Freq: Every day | ORAL | Status: DC

## 2019-12-22 MED ORDER — ZINC SULFATE 220 (50 ZN) MG PO CAPS
220.0000 mg | ORAL_CAPSULE | Freq: Every day | ORAL | Status: DC
  Administered 2019-12-22: 220 mg via ORAL
  Filled 2019-12-22: qty 1

## 2019-12-22 MED ORDER — SPIRONOLACTONE 25 MG PO TABS: 25.0000 mg | ORAL_TABLET | Freq: Every day | ORAL | Status: DC

## 2019-12-22 MED ORDER — DEXAMETHASONE SODIUM PHOSPHATE 10 MG/ML IJ SOLN: 6.0000 mg | INTRAMUSCULAR | Status: DC

## 2019-12-22 MED ORDER — ACETAMINOPHEN 325 MG PO TABS: 650.0000 mg | ORAL_TABLET | Freq: Four times a day (QID) | ORAL | Status: DC | PRN

## 2019-12-22 MED ORDER — SODIUM CHLORIDE 0.9% FLUSH: 3.0000 mL | INTRAVENOUS | Status: DC | PRN

## 2019-12-22 MED ORDER — MOMETASONE FURO-FORMOTEROL FUM 200-5 MCG/ACT IN AERO: 2.0000 | INHALATION_SPRAY | Freq: Two times a day (BID) | RESPIRATORY_TRACT | Status: DC

## 2019-12-22 MED ORDER — SODIUM CHLORIDE 0.9% FLUSH
3.0000 mL | Freq: Two times a day (BID) | INTRAVENOUS | Status: DC
  Administered 2019-12-22 – 2019-12-26 (×9): 3 mL via INTRAVENOUS

## 2019-12-22 MED ORDER — SPIRONOLACTONE 25 MG PO TABS
25.0000 mg | ORAL_TABLET | Freq: Every day | ORAL | Status: DC
  Administered 2019-12-22 – 2019-12-25 (×4): 25 mg via ORAL
  Filled 2019-12-22 (×5): qty 1

## 2019-12-22 MED ORDER — SODIUM CHLORIDE 0.9 % IV SOLN
100.0000 mg | Freq: Every day | INTRAVENOUS | Status: AC
  Administered 2019-12-23 – 2019-12-26 (×4): 100 mg via INTRAVENOUS
  Filled 2019-12-22 (×4): qty 20

## 2019-12-22 MED ORDER — ADULT MULTIVITAMIN W/MINERALS CH
1.0000 | ORAL_TABLET | Freq: Every day | ORAL | Status: DC
  Administered 2019-12-22: 1 via ORAL
  Filled 2019-12-22: qty 1

## 2019-12-22 MED ORDER — DIGOXIN 125 MCG PO TABS
0.1250 mg | ORAL_TABLET | Freq: Every day | ORAL | Status: DC
  Administered 2019-12-23 – 2019-12-26 (×4): 0.125 mg via ORAL
  Filled 2019-12-22 (×4): qty 1

## 2019-12-22 MED ORDER — METHYLPREDNISOLONE SODIUM SUCC 125 MG IJ SOLR: 125.0000 mg | Freq: Once | INTRAMUSCULAR | Status: DC

## 2019-12-22 MED ORDER — ADULT MULTIVITAMIN W/MINERALS CH: 1.0000 | ORAL_TABLET | Freq: Every day | ORAL | Status: DC

## 2019-12-22 MED ORDER — DIGOXIN 125 MCG PO TABS
0.1250 mg | ORAL_TABLET | Freq: Every day | ORAL | Status: DC
  Administered 2019-12-22: 0.125 mg via ORAL
  Filled 2019-12-22: qty 1

## 2019-12-22 MED ORDER — MOMETASONE FURO-FORMOTEROL FUM 200-5 MCG/ACT IN AERO
2.0000 | INHALATION_SPRAY | Freq: Two times a day (BID) | RESPIRATORY_TRACT | Status: DC
  Administered 2019-12-22 – 2019-12-26 (×8): 2 via RESPIRATORY_TRACT
  Filled 2019-12-22 (×2): qty 8.8

## 2019-12-22 MED ORDER — ZINC SULFATE 220 (50 ZN) MG PO CAPS
220.0000 mg | ORAL_CAPSULE | Freq: Every day | ORAL | Status: DC
  Administered 2019-12-23 – 2019-12-26 (×4): 220 mg via ORAL
  Filled 2019-12-22 (×4): qty 1

## 2019-12-22 MED ORDER — GUAIFENESIN-DM 100-10 MG/5ML PO SYRP: 10.0000 mL | ORAL_SOLUTION | ORAL | 0 refills | Status: DC | PRN

## 2019-12-22 MED ORDER — METOPROLOL SUCCINATE ER 50 MG PO TB24: 25.0000 mg | ORAL_TABLET | Freq: Every day | ORAL | Status: DC

## 2019-12-22 MED ORDER — ADULT MULTIVITAMIN W/MINERALS CH
1.0000 | ORAL_TABLET | Freq: Every day | ORAL | Status: DC
  Administered 2019-12-23 – 2019-12-26 (×4): 1 via ORAL
  Filled 2019-12-22 (×4): qty 1

## 2019-12-22 MED ORDER — OXYCODONE-ACETAMINOPHEN 5-325 MG PO TABS
2.0000 | ORAL_TABLET | Freq: Once | ORAL | Status: AC
  Administered 2019-12-22: 2 via ORAL
  Filled 2019-12-22: qty 2

## 2019-12-22 MED ORDER — SACUBITRIL-VALSARTAN 49-51 MG PO TABS
1.0000 | ORAL_TABLET | Freq: Two times a day (BID) | ORAL | Status: DC
  Administered 2019-12-22 – 2019-12-26 (×8): 1 via ORAL
  Filled 2019-12-22 (×10): qty 1

## 2019-12-22 NOTE — ED Notes (Signed)
[facility] documentation reviewed

## 2019-12-22 NOTE — ED Notes (Signed)
Pt stated he stood up to use the urinal despite instructions to call for help and not stand due to concerns of O2 sats dropping. O2 sat did decrease with the exertion. Patient was again instructed not to stand, and to press the call bell when he needs to urinate.  Bed in lowest position, call light within reach.  Will continue to monitor.

## 2019-12-22 NOTE — ED Notes (Signed)
E-signature not working. Pt verbalized understanding of risks and benefits of transfer. Pt gives consent to be transferred at this time. Pt in NAD at departure time.

## 2019-12-22 NOTE — Discharge Summary (Signed)
[place] [facility] [place] NAME: Stanley Marks    MR#:  [id number]  DATE OF BIRTH:  Jun 18, 1941  DATE OF ADMISSION:  12/22/2019 ADMITTING PHYSICIAN: Athena Masse, MD  DATE OF Transfer to [place]: 12/22/2019  PRIMARY CARE PHYSICIAN: Perrin Maltese, MD    ADMISSION DIAGNOSIS:  Acute respiratory failure with hypoxia (HCC) [J96.01]  DISCHARGE DIAGNOSIS:  Principal Problem:   Acute respiratory failure due to COVID-19 [facility]) Active Problems:   Chronic systolic CHF (congestive heart failure) (HCC)   COPD with chronic bronchitis (HCC)   Atrial fibrillation, chronic (HCC)   Acute respiratory failure with hypoxia ([place])   SECONDARY DIAGNOSIS:   Past Medical History:  Diagnosis Date  . Cancer ([place])    skin  . Emphysema of lung ([place])   . Hypertension   . PVD (peripheral vascular disease) ([place])     HOSPITAL COURSE:   1.  Acute hypoxic respiratory failure secondary to COVID-19.  Patient currently requiring 5 L of oxygen.  Sitting up in bed.  Patient given Decadron and remdesivir started.  Will give Actemra.  Called over to [facility] to see if patient can be transferred. 2.  Atrial fibrillation with rapid ventricular response.  This issue is a chronic issue for this patient.  On Xarelto for anticoagulation.  Restart his Toprol 100 mg daily and digoxin.  Metoprolol 5 mg IV every hour as needed fast heart rate.  Telemetry monitoring. 3.  Chronic systolic congestive heart failure.  Hold Entresto this morning and restart this evening.  Restart Toprol.  Hold Demadex this morning. 4.  Chronic kidney disease stage IIIa.  Monitor closely here in the hospital 5.  COPD exacerbation continue steroids and inhalers. 6.  Peripheral vascular disease on anticoagulation with Xarelto 7.  Hypertension.  Blood pressure little bit on the lower side likely secondary to fast heart rate.  Hold Entresto this morning control heart rate a little bit better with  restarting metoprolol and digoxin. 8.  Patient wants to be a DO NOT RESUSCITATE so I changed his CODE STATUS.  DISCHARGE CONDITIONS:   Fair.  Transfer to [facility] when bed available  CONSULTS OBTAINED:  None  DRUG ALLERGIES:   Allergies  Allergen Reactions  . Lipitor [Atorvastatin Calcium] Other (See Comments)    arthralgia    DISCHARGE MEDICATIONS:   Allergies as of 12/22/2019      Reactions   Lipitor [atorvastatin Calcium] Other (See Comments)   arthralgia      Medication List    STOP taking these medications   doxycycline 100 MG tablet Commonly known as: ADOXA   metolazone 5 MG tablet Commonly known as: ZAROXOLYN   predniSONE 20 MG tablet Commonly known as: Deltasone     TAKE these medications   albuterol 108 (90 Base) MCG/ACT inhaler Commonly known as: VENTOLIN HFA Inhale 2 puffs into the lungs every 6 (six) hours as needed for wheezing or shortness of breath.   ascorbic acid 500 MG tablet Commonly known as: VITAMIN C Take 1 tablet (500 mg total) by mouth daily.   dexamethasone 10 MG/ML injection Commonly known as: DECADRON Inject 0.6 mLs (6 mg total) into the vein daily. Start taking on: December 23, 2019   digoxin 0.125 MG tablet Commonly known as: LANOXIN Take 0.125 mg by mouth daily.   Entresto 49-51 MG Generic drug: sacubitril-valsartan Take 1 tablet by mouth 2 (two) times daily.   guaiFENesin-dextromethorphan 100-10 MG/5ML syrup Commonly known as:  ROBITUSSIN DM Take 10 mLs by mouth every 4 (four) hours as needed for cough.   metoprolol tartrate 5 MG/5ML Soln injection Commonly known as: LOPRESSOR Inject 5 mLs (5 mg total) into the vein every hour as needed (hr greater 130).   multivitamin with minerals Tabs tablet Take 1 tablet by mouth daily.   Rivaroxaban 15 MG Tabs tablet Commonly known as: XARELTO Take 15 mg by mouth daily with supper.   spironolactone 25 MG tablet Commonly known as: ALDACTONE Take 25 mg by mouth daily.    Symbicort 160-4.5 MCG/ACT inhaler Generic drug: budesonide-formoterol Inhale 1 puff into the lungs.   Toprol XL 100 MG 24 hr tablet Generic drug: metoprolol succinate Take 100 mg by mouth daily.   torsemide 20 MG tablet Commonly known as: DEMADEX Take 20 mg by mouth daily.   zinc sulfate 220 (50 Zn) MG capsule Take 1 capsule (220 mg total) by mouth daily.        DISCHARGE INSTRUCTIONS:   Follow-up [facility] when bed available  If you experience worsening of your admission symptoms, develop shortness of breath, life threatening emergency, suicidal or homicidal thoughts you must seek medical attention immediately by calling 911 or calling your MD immediately  if symptoms less severe.  You Must read complete instructions/literature along with all the possible adverse reactions/side effects for all the Medicines you take and that have been prescribed to you. Take any new Medicines after you have completely understood and accept all the possible adverse reactions/side effects.   Please note  You were cared for by a hospitalist during your hospital stay. If you have any questions about your discharge medications or the care you received while you were in the hospital after you are discharged, you can call the unit and asked to speak with the hospitalist on call if the hospitalist that took care of you is not available. Once you are discharged, your primary care physician will handle any further medical issues. Please note that NO REFILLS for any discharge medications will be authorized once you are discharged, as it is imperative that you return to your primary care physician (or establish a relationship with a primary care physician if you do not have one) for your aftercare needs so that they can reassess your need for medications and monitor your lab values.    Today   CHIEF COMPLAINT:   Chief Complaint  Patient presents with  . Shortness of Breath    HISTORY OF  PRESENT ILLNESS:  Stanley Marks  is a 79 y.o. male coming in with shortness of breath for a few days   VITAL SIGNS:  Blood pressure 100/85, pulse (!) 133, temperature 99.5 F (37.5 C), temperature source Oral, resp. rate (!) 23, weight 136.1 kg, SpO2 92 %. Patient currently on 5 L of oxygen  PHYSICAL EXAMINATION:  GENERAL:  79 y.o.-year-old patient lying in the bed with no acute distress.  EYES: Pupils equal, round, reactive to light and accommodation. No scleral icterus. HEENT: Head atraumatic, normocephalic. Oropharynx and nasopharynx clear.  NECK:  Supple, no jugular venous distention. No thyroid enlargement, no tenderness.  LUNGS: Decreased breath sounds bilateral, slight expiratory scattered wheezing.  No rales,rhonchi or crepitation. No use of accessory muscles of respiration.  CARDIOVASCULAR: S1, S2 normal. No murmurs, rubs, or gallops.  ABDOMEN: Soft, non-tender, non-distended. Bowel sounds present. No organomegaly or mass.  EXTREMITIES: 2+ pedal edema, no cyanosis, or clubbing.  NEUROLOGIC: Cranial nerves II through XII are intact. Muscle strength  5/5 in all extremities. Sensation intact. Gait not checked.  PSYCHIATRIC: The patient is alert and oriented x 3.  SKIN: Chronic lower extremity skin discoloration  DATA REVIEW:   CBC Recent Labs  Lab 12/22/19 0151  WBC 18.9*  HGB 15.1  HCT 46.6  PLT 204    Chemistries  Recent Labs  Lab 12/22/19 0151  NA 138  K 4.1  CL 101  CO2 29  GLUCOSE 183*  BUN 16  CREATININE 1.21  CALCIUM 8.7*    Microbiology Results  Results for orders placed or performed during the hospital encounter of 12/21/19  Respiratory Panel by RT PCR (Flu A&B, Covid) - Nasopharyngeal Swab     Status: Abnormal   Collection Time: 12/21/19  5:17 PM   Specimen: Nasopharyngeal Swab  Result Value Ref Range Status   SARS Coronavirus 2 by RT PCR POSITIVE (A) NEGATIVE Final    Comment: RESULT CALLED TO, READ BACK BY AND VERIFIED WITH: MAIN,K AT [place] ON  12/21/2019 BY MOSLEY,J (NOTE) SARS-CoV-2 target nucleic acids are DETECTED. SARS-CoV-2 RNA is generally detectable in upper respiratory specimens  during the acute phase of infection. Positive results are indicative of the presence of the identified virus, but do not rule out bacterial infection or co-infection with other pathogens not detected by the test. Clinical correlation with patient history and other diagnostic information is necessary to determine patient infection status. The expected result is Negative. Fact Sheet for Patients:  PinkCheek.be Fact Sheet for Healthcare Providers: GravelBags.it This test is not yet approved or cleared by the [place] FDA and  has been authorized for detection and/or diagnosis of SARS-CoV-2 by FDA under an Emergency Use Authorization (EUA).  This EUA will remain in effect (meaning this test can be use d) for the duration of  the COVID-19 declaration under Section 564(b)(1) of the Act, 21 U.S.C. section 360bbb-3(b)(1), unless the authorization is terminated or revoked sooner.    Influenza A by PCR NEGATIVE NEGATIVE Final   Influenza B by PCR NEGATIVE NEGATIVE Final    Comment: (NOTE) The Xpert Xpress SARS-CoV-2/FLU/RSV assay is intended as an aid in  the diagnosis of influenza from Nasopharyngeal swab specimens and  should not be used as a sole basis for treatment. Nasal washings and  aspirates are unacceptable for Xpert Xpress SARS-CoV-2/FLU/RSV  testing. Fact Sheet for Patients: PinkCheek.be Fact Sheet for Healthcare Providers: GravelBags.it This test is not yet approved or cleared by the [place] FDA and  has been authorized for detection and/or diagnosis of SARS-CoV-2 by  FDA under an Emergency Use Authorization (EUA). This EUA will remain  in effect (meaning this test can be used) for the duration of the  Covid-19  declaration under Section 564(b)(1) of the Act, 21  U.S.C. section 360bbb-3(b)(1), unless the authorization is  terminated or revoked. Performed at [facility], [address] [place], [place] [address]     RADIOLOGY:  DG Chest 2 View  Result Date: 12/21/2019 CLINICAL DATA:  Shortness of breath EXAM: CHEST - 2 VIEW COMPARISON:  March 05, 2019 chest radiograph and chest CT FINDINGS: The interstitium is mildly thickened, stable. There is no appreciable edema or consolidation. Heart is upper normal in size with pulmonary vascularity normal. No adenopathy. There is degenerative change in the thoracic spine. IMPRESSION: Mild interstitial thickening which may be indicative of a degree of chronic bronchitis. No edema or airspace opacity evident. Stable cardiac silhouette. No evident adenopathy. Electronically Signed   By: Lowella Grip III M.D.  On: 12/21/2019 14:10    Management plans discussed with the patient, and he is in agreement.  Tried to reach the patient's granddaughter on the phone number in the computer but unable to reach at this time.  Phone just kept on ringing.  CODE STATUS:     Code Status Orders  (From admission, onward)         Start     Ordered   12/22/19 0826  Do not attempt resuscitation (DNR)  Continuous    Question Answer Comment  In the event of cardiac or respiratory ARREST Do not call a "code blue"   In the event of cardiac or respiratory ARREST Do not perform Intubation, CPR, defibrillation or ACLS   In the event of cardiac or respiratory ARREST Use medication by any route, position, wound care, and other measures to relive pain and suffering. May use oxygen, suction and manual treatment of airway obstruction as needed for comfort.   Comments nurse may pronounce      12/22/19 0825        Code Status History    Date Active Date Inactive Code Status Order ID Comments User Context   12/22/2019 0511 12/22/2019 0825 Full Code [id number]  Athena Masse, MD ED   01/25/2017 0105 01/28/2017 1434 Full Code [id number]  Hugelmeyer, Ubaldo Glassing, DO Inpatient   Advance Care Planning Activity      TOTAL TIME TAKING CARE OF THIS PATIENT: 35 minutes.    Loletha Grayer M.D on 12/22/2019 at 8:30 AM  Between 7am to 6pm - Pager - [phone]  After 6pm go to [email or web address] - password [email or web address]  [facility]  CC: Primary care physician; P[place]in Maltese, MD

## 2019-12-22 NOTE — ED Notes (Signed)
[facility] called and report given, ETA 1-2 hrs

## 2019-12-22 NOTE — ED Triage Notes (Signed)
Pt presents via EMS c/o SOB. Pt seen here earlier today but left AMA. Pt currently on 4L 02 with saturation 93%, pt does not normally wear home 02 per report.

## 2019-12-22 NOTE — H&P (Signed)
History and Physical    Stanley Marks [id number] DOB: 1941/01/12 DOA: 12/22/2019  PCP: Perrin Maltese, MD   Patient coming from: home  I have personally briefly reviewed patient's old medical records in [facility]  Chief Complaint: shortness of breath  HPI: Stanley Marks is a 79 y.o. male with medical history significant for COPD, systolic heart failure, chronic A. fib on Xarelto and hypertension who presents to the emergency room for the second time in 24 hours with shortness of breath.  On his first visit to the emergency room he signed out Stanley Marks but came back several hours later with worsening shortness of breath.  O2 sat was 88% on room air on his first visit, now on his return he is requiring 6 L to maintain sats in the mid 90s.  He has a cough but denies fever, chest pain, nausea vomiting diarrhea  ED Course: On arrival in the emergency room he had a low-grade temperature of 99.5, BP 111/73, mildly tachycardic at 104, respirations 22 O2 sat as mentioned at 94 on 6 L.  Covid test was positive.  He did have a white cell count of 18,000, unremarkable chemistries.  Chest x-ray done on his first visit to the ER earlier showed mild interstitial thickening which may be indicative of a degree of chronic bronchitis, no edema or airspace opacity evident.  She was started on remdesivir and dexamethasone and hospitalist consulted for admission  Review of Systems: As per HPI otherwise 10 point review of systems negative.    Past Medical History:  Diagnosis Date  . Cancer ([place])    skin  . Emphysema of lung ([place])   . Hypertension   . PVD (peripheral vascular disease) ([place])     Past Surgical History:  Procedure Laterality Date  . APPENDECTOMY    . arm surgery    . ELECTROPHYSIOLOGIC STUDY N/A 11/20/2016   Procedure: CARDIOVERSION;  Surgeon: Dionisio David, MD;  Location: [facility] ORS;  Service: Cardiovascular;  Laterality: N/A;     reports that he has been smoking. He  has been smoking about 0.00 packs per day. He has never used smokeless tobacco. He reports that he does not drink alcohol. No history on file for drug.  Allergies  Allergen Reactions  . Lipitor [Atorvastatin Calcium] Other (See Comments)    arthralgia    History reviewed. No pertinent family history.   Prior to Admission medications   Medication Sig Start Date End Date Taking? Authorizing Provider  albuterol (VENTOLIN HFA) 108 (90 Base) MCG/ACT inhaler Inhale 2 puffs into the lungs every 6 (six) hours as needed for wheezing or shortness of breath. 12/21/19   Vanessa [place], MD  budesonide-formoterol (SYMBICORT) 160-4.5 MCG/ACT inhaler Inhale 1 puff into the lungs.     [provider]  digoxin (LANOXIN) 0.125 MG tablet Take 0.125 mg by mouth daily.    [provider]  doxycycline (ADOXA) 100 MG tablet Take 1 tablet (100 mg total) by mouth 2 (two) times daily for 10 days. 12/21/19 12/31/19  Vanessa [place], MD  metolazone (ZAROXOLYN) 5 MG tablet Take by mouth.    [provider]  predniSONE (DELTASONE) 20 MG tablet Take 2 tablets (40 mg total) by mouth daily with breakfast for 5 days. 12/21/19 12/26/19  Vanessa [place], MD  Rivaroxaban (XARELTO) 15 MG TABS tablet Take 15 mg by mouth daily with supper.    [provider]  sacubitril-valsartan (ENTRESTO) 49-51 MG Take 1 tablet by  mouth 2 (two) times daily.    [provider]  spironolactone (ALDACTONE) 25 MG tablet Take 25 mg by mouth daily.    [provider]  TOPROL XL 25 MG 24 hr tablet  12/19/18   [provider]  torsemide (DEMADEX) 20 MG tablet Take 20 mg by mouth daily.    [provider]    Physical Exam: Vitals:   12/22/19 0146 12/22/19 0147 12/22/19 0330  BP: 111/73  116/79  Pulse: (!) 104  (!) 129  Resp: (!) 22  (!) 27  Temp: 99.5 F (37.5 C)    TempSrc: Oral    SpO2: 94%  94%  Weight:  136.1 kg      Vitals:   12/22/19 0146 12/22/19 0147 12/22/19 0330  BP:  111/73  116/79  Pulse: (!) 104  (!) 129  Resp: (!) 22  (!) 27  Temp: 99.5 F (37.5 C)    TempSrc: Oral    SpO2: 94%  94%  Weight:  136.1 kg     Constitutional: conversational dyspnea, alert and oriented x 3 Eyes: PERRL, lids and conjunctivae normal ENMT: Mucous membranes are moist.  Neck: normal, supple, no masses, no thyromegaly Respiratory: increased respiratory effort.wheezes. tachypnea No accessory muscle use.  Cardiovascular: A fib no murmurs / rubs / gallops. 2+ extremity edema. 2+ pedal pulses. No carotid bruits.  Abdomen: no tenderness, no masses palpated. No hepatosplenomegaly. Bowel sounds positive.  Musculoskeletal: no clubbing / cyanosis. No joint deformity upper and lower extremities.  Skin: venous stasis with dermatitis Neurologic: No gross focal neurologic deficit. Psychiatric: Normal mood and affect.   Labs on Admission: I have personally reviewed following labs and imaging studies  CBC: Recent Labs  Lab 12/21/19 1345 12/22/19 0151  WBC 13.6* 18.9*  HGB 15.7 15.1  HCT 49.0 46.6  MCV 94.6 93.8  PLT 205 0000000   Basic Metabolic Panel: Recent Labs  Lab 12/21/19 1345 12/22/19 0151  NA 140 138  K 4.2 4.1  CL 100 101  CO2 33* 29  GLUCOSE 131* 183*  BUN 15 16  CREATININE 1.26* 1.21  CALCIUM 9.0 8.7*   GFR: Estimated Creatinine Clearance: 76.3 mL/min (by C-G formula based on SCr of 1.21 mg/dL). Liver Function Tests: No results for input(s): AST, ALT, ALKPHOS, BILITOT, PROT, ALBUMIN in the last 168 hours. No results for input(s): LIPASE, AMYLASE in the last 168 hours. No results for input(s): AMMONIA in the last 168 hours. Coagulation Profile: No results for input(s): INR, PROTIME in the last 168 hours. Cardiac Enzymes: No results for input(s): CKTOTAL, CKMB, CKMBINDEX, TROPONINI in the last 168 hours. BNP (last 3 results) No results for input(s): PROBNP in the last 8760 hours. HbA1C: No results for input(s): HGBA1C in the last 72 hours. CBG: No  results for input(s): GLUCAP in the last 168 hours. Lipid Profile: Recent Labs    12/22/19 0151  TRIG 53   Thyroid Function Tests: No results for input(s): TSH, T4TOTAL, FREET4, T3FREE, THYROIDAB in the last 72 hours. Anemia Panel: No results for input(s): VITAMINB12, FOLATE, FERRITIN, TIBC, IRON, RETICCTPCT in the last 72 hours. Urine analysis: No results found for: COLORURINE, APPEARANCEUR, [place], [place], [place], [place], [place], [place], [place], [place], NITRITE, LEUKOCYTESUR  Radiological Exams on Admission: DG Chest 2 View  Result Date: 12/21/2019 CLINICAL DATA:  Shortness of breath EXAM: CHEST - 2 VIEW COMPARISON:  March 05, 2019 chest radiograph and chest CT FINDINGS: The interstitium is mildly thickened, stable. There is no appreciable edema or consolidation.  Heart is upper normal in size with pulmonary vascularity normal. No adenopathy. There is degenerative change in the thoracic spine. IMPRESSION: Mild interstitial thickening which may be indicative of a degree of chronic bronchitis. No edema or airspace opacity evident. Stable cardiac silhouette. No evident adenopathy. Electronically Signed   By: Lowella Grip III M.D.   On: 12/21/2019 14:10    EKG: Independently reviewed.  Assessment/Plan Principal Problem:   Acute respiratory failure due to COVID-19 (HCC) Remdesivir, IV dexamethasone, albuterol, multivitamins Oxygen to keep sats over 92% Proning as tolerated Procalcitonin done during visit earlier today was less than 0.10, so leukocytosis of 18,000 probably due to prior steroid treatment    Chronic systolic CHF (congestive heart failure) (HCC) Euvolemic.  Not exacerbated Continue digoxin, Toprol, Entresto and torsemide once med reconciliation completed    COPD with chronic bronchitis (HCC) -Albuterol inhaler as needed    Atrial fibrillation, chronic (HCC) -slight RVR of 104 Continue Toprol Continue Xarelto for primary stroke prevention         DVT prophylaxis: on full dose anticoag with xarelto  Code Status: full code  Family Communication: none  Disposition Plan: Back to previous home environment Consults called: none     Athena Masse MD [facility]     12/22/2019, 5:11 AM

## 2019-12-22 NOTE — Progress Notes (Signed)
Called grand-daughter, unable to get through. Called daughter in law. She is updated. All questions answered. She has no other questions at this time.

## 2019-12-22 NOTE — H&P (Signed)
History and Physical    Ibrohim Buccieri [id number] DOB: [birth year] DOA: 12/22/2019  PCP: Perrin Maltese, MD  Patient coming from: Home via [facility].  I have personally briefly reviewed patient's old medical records available.   Chief Complaint: Shortness of breath  HPI: Toluwalase Mitrovic is a 79 y.o. male with medical history significant of COPD, chronic systolic heart failure, chronic A. fib on Xarelto, hypertension, active smoker, informally using 4 L of oxygen at home from his daughter-in-law for his COPD presented as transfer from [facility] with COVID-19 pneumonia and hypoxemia.  Patient does have history of COPD, he still smokes, patient states that he has been using about 4 L through concentrator, his daughter-in-law's oxygen for last many months.  Last few days he has been having shortness of breath and cough.  Also had low-grade fever.  He came to emergency room with this symptoms, was on 4 L of oxygen, after seen in the emergency room he felt well and left AGAINST MEDICAL ADVICE.  He came back to emergency room in the middle of the night as he could not breathe at home. ED Course: On arrival to emergency room he had low-grade temperature, blood pressure was stable.  Respiratory rate 22.  Oxygen was 94% on 6 L.  Covid positive.  WC count 18,000.  Chest x-ray showed some chronic changes. Patient was treated with remdesivir dexamethasone and Actemra at [facility]. When arriving to the [place], on my interview patient states that he is already feeling better and may be able to go home in 1 or 2 days.  Review of Systems: all systems are reviewed and pertinent positive as per HPI otherwise rest are negative.    Past Medical History:  Diagnosis Date  . Cancer ([place])    skin  . Emphysema of lung ([place])   . Hypertension   . PVD (peripheral vascular disease) ([place])     Past Surgical History:  Procedure Laterality Date  .  APPENDECTOMY    . arm surgery    . ELECTROPHYSIOLOGIC STUDY N/A 11/20/2016   Procedure: CARDIOVERSION;  Surgeon: Dionisio David, MD;  Location: [facility] ORS;  Service: Cardiovascular;  Laterality: N/A;     reports that he has been smoking. He has been smoking about 0.00 packs per day. He has never used smokeless tobacco. He reports that he does not drink alcohol. No history on file for drug.  Allergies  Allergen Reactions  . Lipitor [Atorvastatin Calcium] Other (See Comments)    arthralgia    History reviewed. No pertinent family history.   Prior to Admission medications   Medication Sig Start Date End Date Taking? Authorizing Provider  albuterol (VENTOLIN HFA) 108 (90 Base) MCG/ACT inhaler Inhale 2 puffs into the lungs every 6 (six) hours as needed for wheezing or shortness of breath. 12/21/19  Yes Vanessa [place], MD  budesonide-formoterol [facility]) 160-4.5 MCG/ACT inhaler Inhale 1 puff into the lungs daily.    Yes [provider]  digoxin (LANOXIN) 0.125 MG tablet Take 0.125 mg by mouth daily.   Yes [provider]  sacubitril-valsartan (ENTRESTO) 49-51 MG Take 1 tablet by mouth 2 (two) times daily.   Yes [provider]  spironolactone (ALDACTONE) 25 MG tablet Take 25 mg by mouth daily.   Yes [provider]  torsemide (DEMADEX) 20 MG tablet Take 20 mg by mouth daily.   Yes [provider]  ascorbic acid (VITAMIN C) 500 MG tablet Take 1  tablet (500 mg total) by mouth daily. 12/22/19   Loletha Grayer, MD  dexamethasone (DECADRON) 10 MG/ML injection Inject 0.6 mLs (6 mg total) into the vein daily. 12/23/19   Loletha Grayer, MD  guaiFENesin-dextromethorphan (ROBITUSSIN DM) 100-10 MG/5ML syrup Take 10 mLs by mouth every 4 (four) hours as needed for cough. 12/22/19   Loletha Grayer, MD  metoprolol tartrate (LOPRESSOR) 5 MG/5ML SOLN injection Inject 5 mLs (5 mg total) into the vein every hour as needed (hr greater 130). 12/22/19   Loletha Grayer,  MD  Multiple Vitamin (MULTIVITAMIN WITH MINERALS) TABS tablet Take 1 tablet by mouth daily. 12/22/19   Loletha Grayer, MD  Rivaroxaban (XARELTO) 15 MG TABS tablet Take 15 mg by mouth daily with supper.    [provider]  TOPROL XL 100 MG 24 hr tablet Take 100 mg by mouth daily.  12/19/18   [provider]  zinc sulfate 220 (50 Zn) MG capsule Take 1 capsule (220 mg total) by mouth daily. 12/22/19   Loletha Grayer, MD    Physical Exam: Vitals:   12/22/19 1347 12/22/19 1543  BP: 101/78 107/75  Pulse: (!) 101 (!) 109  Resp: (!) 28 18  Temp: (!) 97.5 F (36.4 C) (!) 97.5 F (36.4 C)  TempSrc: Oral Oral  SpO2: 91% 92%  Weight: 136 kg   Height: 6\' 2"  (1.88 m)     Constitutional: NAD, calm, comfortable Vitals:   12/22/19 1347 12/22/19 1543  BP: 101/78 107/75  Pulse: (!) 101 (!) 109  Resp: (!) 28 18  Temp: (!) 97.5 F (36.4 C) (!) 97.5 F (36.4 C)  TempSrc: Oral Oral  SpO2: 91% 92%  Weight: 136 kg   Height: 6\' 2"  (1.88 m)    Eyes: PERRL, lids and conjunctivae normal ENMT: Mucous membranes are moist. Posterior pharynx clear of any exudate or lesions.Normal dentition.  Neck: normal, supple, no masses, no thyromegaly Respiratory: clear to auscultation bilaterally, no wheezing, no crackles. Normal respiratory effort. No accessory muscle use.  No added sounds. Cardiovascular: Regular rate and rhythm, no murmurs / rubs / gallops.  1+ pedal edema bilateral.  2+ pedal pulses. No carotid bruits.  Abdomen: no tenderness, no masses palpated. No hepatosplenomegaly. Bowel sounds positive.  Obese and pendulous. Musculoskeletal: no clubbing / cyanosis. No joint deformity upper and lower extremities. Good ROM, no contractures. Normal muscle tone.  Skin: no rashes, lesions, ulcers. No induration Neurologic: CN 2-12 grossly intact. Sensation intact, DTR normal. Strength 5/5 in all 4.  Psychiatric: Normal judgment and insight. Alert and oriented x 3. Normal mood.     Labs on  Admission: I have personally reviewed following labs and imaging studies  CBC: Recent Labs  Lab 12/21/19 1345 12/22/19 0151  WBC 13.6* 18.9*  HGB 15.7 15.1  HCT 49.0 46.6  MCV 94.6 93.8  PLT 205 0000000   Basic Metabolic Panel: Recent Labs  Lab 12/21/19 1345 12/22/19 0151  NA 140 138  K 4.2 4.1  CL 100 101  CO2 33* 29  GLUCOSE 131* 183*  BUN 15 16  CREATININE 1.26* 1.21  CALCIUM 9.0 8.7*   GFR: Estimated Creatinine Clearance: 76.2 mL/min (by C-G formula based on SCr of 1.21 mg/dL). Liver Function Tests: Recent Labs  Lab 12/22/19 0524  AST 19  ALT 18  ALKPHOS 48  BILITOT 2.5*  PROT 7.1  ALBUMIN 3.1*   No results for input(s): LIPASE, AMYLASE in the last 168 hours. No results for input(s): AMMONIA in the last 168 hours. Coagulation  Profile: No results for input(s): INR, PROTIME in the last 168 hours. Cardiac Enzymes: No results for input(s): CKTOTAL, CKMB, CKMBINDEX, TROPONINI in the last 168 hours. BNP (last 3 results) No results for input(s): PROBNP in the last 8760 hours. HbA1C: No results for input(s): HGBA1C in the last 72 hours. CBG: No results for input(s): GLUCAP in the last 168 hours. Lipid Profile: Recent Labs    12/22/19 0151  TRIG 53   Thyroid Function Tests: No results for input(s): TSH, T4TOTAL, FREET4, T3FREE, THYROIDAB in the last 72 hours. Anemia Panel: Recent Labs    12/22/19 0524  FERRITIN 237   Urine analysis: No results found for: COLORURINE, APPEARANCEUR, LABSPEC, [place], GLUCOSEU, HGBUR, BILIRUBINUR, KETONESUR, PROTEINUR, UROBILINOGEN, NITRITE, LEUKOCYTESUR  Radiological Exams on Admission: DG Chest 2 View  Result Date: 12/21/2019 CLINICAL DATA:  Shortness of breath EXAM: CHEST - 2 VIEW COMPARISON:  March 05, 2019 chest radiograph and chest CT FINDINGS: The interstitium is mildly thickened, stable. There is no appreciable edema or consolidation. Heart is upper normal in size with pulmonary vascularity normal. No adenopathy.  There is degenerative change in the thoracic spine. IMPRESSION: Mild interstitial thickening which may be indicative of a degree of chronic bronchitis. No edema or airspace opacity evident. Stable cardiac silhouette. No evident adenopathy. Electronically Signed   By: Lowella Grip III M.D.   On: 12/21/2019 14:10    EKG: Independently reviewed.  Normal sinus rhythm.  May have occasional flutter waves.  QTc 467.  Assessment/Plan Principal Problem:   Acute and chronic respiratory failure with hypoxia (HCC) Active Problems:   Chronic systolic CHF (congestive heart failure) (HCC)   Acute respiratory failure due to COVID-19 [facility]   Atrial fibrillation, chronic (HCC)   Essential hypertension   Pneumonia due to COVID-19 virus     1.  Acute on chronic hypoxic respiratory failure, probably due to COPD exacerbation from COVID-19 virus pneumonia: Agree with admission given severity of symptoms and underlying severe lung disease.   chest physiotherapy, incentive spirometry, deep breathing exercises, sputum induction, mucolytic's and bronchodilators. Supplemental oxygen to keep saturations more than 88%. Covid directed therapy with , steroids, continue dexamethasone remdesivir, continue remdesivir actemra, a dose given before transfer at [place]. antibiotics, not indicated at this time.  Procalcitonin normal. Due to severity of symptoms, patient will need daily inflammatory markers, chest x-rays, liver function test to monitor and direct COVID-19 therapies.  2.  Chronic hypoxemic respiratory failure: Patient using oxygen at home without formal diagnosis.  After clinical improvement, patient may still need home oxygen evaluation.  3.  Chronic diastolic heart failure: Patient is very well compensated.  He is on multiple regimen including Aldactone, Entresto, digoxin and torsemide.  We will continue this.  Will need close monitoring of renal functions and potassium levels.  4.  Hypertension:  Blood pressure is stable.  5.  Chronic atrial fibrillation: Rate controlled.  Resume metoprolol and digoxin.  Therapeutic on Xarelto.   DVT prophylaxis: On Xarelto Code Status: full code. Patient says he will try life saving measures before giving up . Change to full code  Family Communication: None Disposition Plan: Home after clinical improvement Consults called: None Admission status: Inpatient telemetry.   Barb Merino MD [facility] Pager [phone]

## 2019-12-22 NOTE — Progress Notes (Signed)
Pharmacy Antibiotic Note  Stanley Marks is a 79 y.o. male admitted on 12/22/2019 with Acute respiratory failure due to COVID-19.  Pharmacy has been consulted for Remdesivir dosing.  Patient transferred from [facility].  He received 1st dose of Remdesivir 200 mg IV this AM at [facility].    Plan: Remdesivir 100 mg IV q24hr x 4 days to complete total of 5 days of therapy.   Height: 6\' 2"  (188 cm) Weight: 299 lb 13.2 oz (136 kg) IBW/kg (Calculated) : 82.2  Temp (24hrs), Avg:98.8 F (37.1 C), Min:97.5 F (36.4 C), Max:99.5 F (37.5 C)  Recent Labs  Lab 12/21/19 1345 12/22/19 0151  WBC 13.6* 18.9*  CREATININE 1.26* 1.21    Estimated Creatinine Clearance: 76.2 mL/min (by C-G formula based on SCr of 1.21 mg/dL).    Allergies  Allergen Reactions  . Lipitor [Atorvastatin Calcium] Other (See Comments)    arthralgia    Antimicrobials this admission: Remdesivir 1/12>>(1/16)    Microbiology results: 1/11 COVID + ,  Influenza A/B : negative 1/12 BCX: sent   Thank you for allowing pharmacy to be a part of this patient's care. Nicole Cella, RPh Clinical Pharmacist 12/22/2019 2:27 PM

## 2019-12-22 NOTE — Plan of Care (Signed)

## 2019-12-22 NOTE — ED Notes (Signed)
Attempted to call report to [facility] [phone] with no one answering the phone.

## 2019-12-22 NOTE — ED Triage Notes (Signed)
Mom calling to discuss isolation guidelines for COVID-19.  Discussed recommendations per CDC.  Mom was concerned daycare was asking for testing after 10 day isolation period.  RN explained, it was not necessary, but daycare could have their own guidelines.  Mom will review CDC guidelines on website and talk with daycare.

## 2019-12-22 NOTE — ED Provider Notes (Signed)
[facility] Emergency Department Provider Note  ____________________________________________   First MD Initiated Contact with Patient 12/22/19 [phone]     (approximate)  I have reviewed the triage vital signs and the nursing notes.   HISTORY  Chief Complaint Shortness of Breath  Level 5 caveat:  history/ROS limited by acute/critical illness  HPI Stanley Marks is a 79 y.o. male with medical history as listed below  who presents for evaluation of worsening shortness of breath.  He was seen in this emergency department yesterday afternoon, less than 12 hours ago, and diagnosed with COPD exacerbation and probable COVID-19.  He was offered admission due to his increased oxygen requirement and increased work of breathing but he left AGAINST MEDICAL ADVICE.  In the interim his COVID-19 test came back positive.  He says that he was doing okay at home but he was trying to sleep and around 1 AM he simply could not catch his breath adequately.  He has some oxygen at home that he uses sometimes but he said that it belongs to his daughter who has stage IV lung cancer and he does not regularly use oxygen.  Exertion makes his symptoms worse, nothing in particular makes them better.  He smokes heavily, denies having subjective fevers and chills, denies chest pain, denies abdominal pain.        Past Medical History:  Diagnosis Date  . Cancer ([place])    skin  . Emphysema of lung ([place])   . Hypertension   . PVD (peripheral vascular disease) [facility])     Patient Active Problem List   Diagnosis Date Noted  . Acute respiratory failure due to COVID-19 ([place]) 12/22/2019  . COPD with chronic bronchitis ([place]) 12/22/2019  . Atrial fibrillation, chronic ([place]) 12/22/2019  . Acute respiratory failure with hypoxia ([place]) 12/22/2019  . Influenza A 01/28/2017  . Pneumonia 01/28/2017  . COPD exacerbation ([place]) 01/28/2017  . Tobacco abuse counseling 01/28/2017  . Patient's noncompliance with  other medical treatment and regimen 01/28/2017  . Emphysema of lung ([place]) 01/28/2017  . Leukocytosis 01/28/2017  . Chronic systolic CHF (congestive heart failure) ([place]) 01/28/2017  . Bradycardia 01/28/2017  . Acute and chronic respiratory failure with hypoxia ([place]) 01/26/2017  . Obstructive sleep apnea syndrome 03/01/2016  . Personal history of other malignant neoplasm of skin 12/08/2014    Past Surgical History:  Procedure Laterality Date  . APPENDECTOMY    . arm surgery    . ELECTROPHYSIOLOGIC STUDY N/A 11/20/2016   Procedure: CARDIOVERSION;  Surgeon: Dionisio David, MD;  Location: [facility] ORS;  Service: Cardiovascular;  Laterality: N/A;    Prior to Admission medications   Medication Sig Start Date End Date Taking? Authorizing Provider  albuterol (VENTOLIN HFA) 108 (90 Base) MCG/ACT inhaler Inhale 2 puffs into the lungs every 6 (six) hours as needed for wheezing or shortness of breath. 12/21/19   Vanessa [place], MD  budesonide-formoterol (SYMBICORT) 160-4.5 MCG/ACT inhaler Inhale 1 puff into the lungs.     [provider]  digoxin (LANOXIN) 0.125 MG tablet Take 0.125 mg by mouth daily.    [provider]  doxycycline (ADOXA) 100 MG tablet Take 1 tablet (100 mg total) by mouth 2 (two) times daily for 10 days. 12/21/19 12/31/19  Vanessa Medulla, MD  metolazone (ZAROXOLYN) 5 MG tablet Take by mouth.    [provider]  predniSONE (DELTASONE) 20 MG tablet Take 2 tablets (40 mg total) by mouth daily with breakfast for 5 days. 12/21/19 12/26/19  Vanessa [place], MD  Rivaroxaban (XARELTO) 15 MG TABS tablet Take 15 mg by mouth daily with supper.    [provider]  sacubitril-valsartan (ENTRESTO) 49-51 MG Take 1 tablet by mouth 2 (two) times daily.    [provider]  spironolactone (ALDACTONE) 25 MG tablet Take 25 mg by mouth daily.    [provider]  TOPROL XL 25 MG 24 hr tablet  12/19/18   [provider]  torsemide (DEMADEX) 20 MG tablet  Take 20 mg by mouth daily.    [provider]    Allergies Lipitor [atorvastatin calcium]  History reviewed. No pertinent family history.  Social History Social History   Tobacco Use  . Smoking status: Current Every Day Smoker    Packs/day: 0.00  . Smokeless tobacco: Never Used  Substance Use Topics  . Alcohol use: No  . Drug use: Not on file    Review of Systems Constitutional: No subjective fever/chills but low-grade fever in the ED. Eyes: No visual changes. ENT: No sore throat. Cardiovascular: Denies chest pain. Respiratory: +shortness of breath. Gastrointestinal: No abdominal pain.  No nausea, no vomiting.  No diarrhea.  No constipation. Genitourinary: Negative for dysuria. Musculoskeletal: Negative for neck pain.  Negative for back pain. Integumentary: Negative for rash. Neurological: Negative for headaches, focal weakness or numbness.   ____________________________________________   PHYSICAL EXAM:  VITAL SIGNS: ED Triage Vitals  Enc Vitals Group     BP 12/22/19 0146 111/73     Pulse Rate 12/22/19 0146 (!) 104     Resp 12/22/19 0146 (!) 22     Temp 12/22/19 0146 99.5 F (37.5 C)     Temp Source 12/22/19 0146 Oral     SpO2 12/22/19 0146 94 %     Weight 12/22/19 0147 136.1 kg (300 lb)     Height --      Head Circumference --      Peak Flow --      Pain Score --      Pain Loc --      Pain Edu? --      Excl. in [place]? --     Constitutional: Alert and oriented.  Mild respiratory distress. Eyes: Conjunctivae are normal.  Head: Atraumatic. Nose: No congestion/rhinnorhea. Mouth/Throat: Patient is wearing a mask. Neck: No stridor.  No meningeal signs.   Cardiovascular: Mild tachycardia, irregular rhythm. Good peripheral circulation. Grossly normal heart sounds.  Wheezing throughout lung fields with coarse breath sounds throughout. Respiratory: Normal respiratory effort.  No retractions. Gastrointestinal: Soft and nontender. No distention.    Musculoskeletal: No lower extremity tenderness nor edema. No gross deformities of extremities. Neurologic:  Normal speech and language. No gross focal neurologic deficits are appreciated.  Skin:  Skin is warm, dry and intact. Psychiatric: Mood and affect are normal. Speech and behavior are normal.  ____________________________________________   LABS (all labs ordered are listed, but only abnormal results are displayed)  Labs Reviewed  CBC - Abnormal; Notable for the following components:      Result Value   WBC 18.9 (*)    RDW 15.9 (*)    All other components within normal limits  BASIC METABOLIC PANEL - Abnormal; Notable for the following components:   Glucose, Bld 183 (*)    Calcium 8.7 (*)    GFR calc non Af Amer 58 (*)    All other components within normal limits  BRAIN NATRIURETIC PEPTIDE - Abnormal; Notable for the following components:   B Natriuretic Peptide  174.0 (*)    All other components within normal limits  CULTURE, BLOOD (ROUTINE X 2)  CULTURE, BLOOD (ROUTINE X 2)  TRIGLYCERIDES  FIBRIN DERIVATIVES D-DIMER ([facility] ONLY)  LACTATE DEHYDROGENASE  FERRITIN  FIBRINOGEN  C-REACTIVE PROTEIN  ABO/RH  TROPONIN I (HIGH SENSITIVITY)  TROPONIN I (HIGH SENSITIVITY)   ____________________________________________  EKG  ED ECG REPORT I, Hinda Kehr, the attending physician, personally viewed and interpreted this ECG.  Date: 12/22/2019 EKG Time: 1:46 AM Rate: 124 Rhythm: A. fib with RVR QRS Axis: normal Intervals: normal other than a-fib ST/T Wave abnormalities: Non-specific ST segment / T-wave changes, but no clear evidence of acute ischemia. Narrative Interpretation: no definitive evidence of acute ischemia; does not meet STEMI criteria.   ____________________________________________  RADIOLOGY I, Hinda Kehr, personally viewed and evaluated these images (plain radiographs) as part of my medical decision making, as well as reviewing the written report by the  radiologist.  ED MD interpretation: Chronic bronchitis without any obvious opacities or infiltrates.  Official radiology report(s): DG Chest 2 View  Result Date: 12/21/2019 CLINICAL DATA:  Shortness of breath EXAM: CHEST - 2 VIEW COMPARISON:  March 05, 2019 chest radiograph and chest CT FINDINGS: The interstitium is mildly thickened, stable. There is no appreciable edema or consolidation. Heart is upper normal in size with pulmonary vascularity normal. No adenopathy. There is degenerative change in the thoracic spine. IMPRESSION: Mild interstitial thickening which may be indicative of a degree of chronic bronchitis. No edema or airspace opacity evident. Stable cardiac silhouette. No evident adenopathy. Electronically Signed   By: Lowella Grip III M.D.   On: 12/21/2019 14:10    ____________________________________________   PROCEDURES   Procedure(s) performed (including Critical Care):  .Critical Care Performed by: Hinda Kehr, MD Authorized by: Hinda Kehr, MD   Critical care provider statement:    Critical care time (minutes):  30   Critical care time was exclusive of:  Separately billable procedures and treating other patients   Critical care was necessary to treat or prevent imminent or life-threatening deterioration of the following conditions:  Respiratory failure   Critical care was time spent personally by me on the following activities:  Development of treatment plan with patient or surrogate, discussions with consultants, evaluation of patient's response to treatment, examination of patient, obtaining history from patient or surrogate, ordering and performing treatments and interventions, ordering and review of laboratory studies, ordering and review of radiographic studies, pulse oximetry, re-evaluation of patient's condition and review of old charts     ____________________________________________   [place] / MDM / [place] / ED COURSE  As  part of my medical decision making, I reviewed the following data within the [place] notes reviewed and incorporated, Labs reviewed , EKG interpreted , Old chart reviewed, Radiograph reviewed , Discussed with admitting physician  and Notes from prior ED visits   Differential diagnosis includes, but is not limited to, lower respiratory tract infection due to COVID-19, COPD exacerbation, community-acquired pneumonia.  The patient's lab work is essentially the same as it was during his last visit.  I have ordered an albuterol inhaler to try to cut down on the COVID-19 transmission.  Ordered dexamethasone 10 mg IV and remdesivir per pharmacy protocol.  Patient says he will stay in the hospital this time.  He is requiring at least 5 L of oxygen by nasal cannula to stay in the low 90s.  I have consulted the hospitalist for admission.  Clinical Course as of Dec 22 719  Tue Dec 22, 2019  [id number] Discussed case by phone with Dr. Damita Dunnings with the hospitalist service who will admit.   [CF]    Clinical Course User Index [CF] Hinda Kehr, MD     ____________________________________________  FINAL CLINICAL IMPRESSION(S) / ED DIAGNOSES  Final diagnoses:  Lower respiratory tract infection due to COVID-19 virus  COPD exacerbation ([place])     MEDICATIONS GIVEN DURING THIS VISIT:  Medications  remdesivir 200 mg in sodium chloride 0.9% 250 mL IVPB (has no administration in time range)    Followed by  remdesivir 100 mg in sodium chloride 0.9 % 100 mL IVPB (has no administration in time range)  dexamethasone (DECADRON) injection 10 mg (has no administration in time range)  albuterol (VENTOLIN HFA) 108 (90 Base) MCG/ACT inhaler 4-6 puff (has no administration in time range)  remdesivir 200 mg in sodium chloride 0.9% 250 mL IVPB (has no administration in time range)    Followed by  remdesivir 100 mg in sodium chloride 0.9 % 100 mL IVPB (has no administration in time range)    albuterol (VENTOLIN HFA) 108 (90 Base) MCG/ACT inhaler 2 puff (has no administration in time range)  dexamethasone (DECADRON) injection 6 mg (has no administration in time range)  guaiFENesin-dextromethorphan (ROBITUSSIN DM) 100-10 MG/5ML syrup 10 mL (has no administration in time range)  ascorbic acid (VITAMIN C) tablet 500 mg (has no administration in time range)  zinc sulfate capsule 220 mg (has no administration in time range)  multivitamin with minerals tablet 1 tablet (has no administration in time range)     ED Discharge Orders    None      *Please note:  Chadney Prieur was evaluated in Emergency Department on 12/22/2019 for the symptoms described in the history of present illness. He was evaluated in the context of the global COVID-19 pandemic, which necessitated consideration that the patient might be at risk for infection with the SARS-CoV-2 virus that causes COVID-19. Institutional protocols and algorithms that pertain to the evaluation of patients at risk for COVID-19 are in a state of rapid change based on information released by regulatory bodies including the CDC and federal and state organizations. These policies and algorithms were followed during the patient's care in the ED.  Some ED evaluations and interventions may be delayed as a result of limited staffing during the pandemic.*  Note:  This document was prepared using Dragon voice recognition software and may include unintentional dictation errors.   Hinda Kehr, MD 12/22/19 [phone]

## 2019-12-23 LAB — COMPREHENSIVE METABOLIC PANEL
ALT: 21 U/L (ref 0–44)
AST: 15 U/L (ref 15–41)
Albumin: 2.9 g/dL — ABNORMAL LOW (ref 3.5–5.0)
Alkaline Phosphatase: 54 U/L (ref 38–126)
Anion gap: 9 (ref 5–15)
BUN: 30 mg/dL — ABNORMAL HIGH (ref 8–23)
CO2: 32 mmol/L (ref 22–32)
Calcium: 8.7 mg/dL — ABNORMAL LOW (ref 8.9–10.3)
Chloride: 101 mmol/L (ref 98–111)
Creatinine, Ser: 1.48 mg/dL — ABNORMAL HIGH (ref 0.61–1.24)
GFR calc Af Amer: 53 mL/min — ABNORMAL LOW (ref >60)
GFR calc non Af Amer: 45 mL/min — ABNORMAL LOW (ref >60)
Glucose, Bld: 114 mg/dL — ABNORMAL HIGH (ref 70–99)
Potassium: 4.2 mmol/L (ref 3.5–5.1)
Sodium: 142 mmol/L (ref 135–145)
Total Bilirubin: 1.1 mg/dL (ref 0.3–1.2)
Total Protein: 6.5 g/dL (ref 6.5–8.1)

## 2019-12-23 LAB — CBC WITH DIFFERENTIAL/PLATELET
Abs Immature Granulocytes: 0.08 10*3/uL — ABNORMAL HIGH (ref 0.00–0.07)
Basophils Absolute: 0 10*3/uL (ref 0.0–0.1)
Basophils Relative: 0 %
Eosinophils Absolute: 0 10*3/uL (ref 0.0–0.5)
Eosinophils Relative: 0 %
HCT: 47.2 % (ref 39.0–52.0)
Hemoglobin: 14.7 g/dL (ref 13.0–17.0)
Immature Granulocytes: 1 %
Lymphocytes Relative: 3 %
Lymphs Abs: 0.5 10*3/uL — ABNORMAL LOW (ref 0.7–4.0)
MCH: 30.4 pg (ref 26.0–34.0)
MCHC: 31.1 g/dL (ref 30.0–36.0)
MCV: 97.5 fL (ref 80.0–100.0)
Monocytes Absolute: 0.5 10*3/uL (ref 0.1–1.0)
Monocytes Relative: 3 %
Neutro Abs: 15.1 10*3/uL — ABNORMAL HIGH (ref 1.7–7.7)
Neutrophils Relative %: 93 %
Platelets: 205 10*3/uL (ref 150–400)
RBC: 4.84 MIL/uL (ref 4.22–5.81)
RDW: 15.9 % — ABNORMAL HIGH (ref 11.5–15.5)
WBC: 16.3 10*3/uL — ABNORMAL HIGH (ref 4.0–10.5)
nRBC: 0 % (ref 0.0–0.2)

## 2019-12-23 LAB — PHOSPHORUS: Phosphorus: 3.5 mg/dL (ref 2.5–4.6)

## 2019-12-23 LAB — FERRITIN: Ferritin: 328 ng/mL (ref 24–336)

## 2019-12-23 LAB — D-DIMER, QUANTITATIVE: D-Dimer, Quant: 0.95 ug/mL-FEU — ABNORMAL HIGH (ref 0.00–0.50)

## 2019-12-23 LAB — C-REACTIVE PROTEIN: CRP: 21.6 mg/dL — ABNORMAL HIGH (ref <1.0)

## 2019-12-23 LAB — MAGNESIUM: Magnesium: 2.4 mg/dL (ref 1.7–2.4)

## 2019-12-23 NOTE — Evaluation (Signed)
Physical Therapy Evaluation Patient Details Name: Stanley Marks MRN: [id number] DOB: 1941/07/15 Today's Date: 12/23/2019   History of Present Illness  Stanley Marks is a 79 y.o. male with medical history significant of COPD, chronic systolic heart failure, chronic A. fib on Xarelto, hypertension, active smoker, informally using 4 L of oxygen at home from his daughter-in-law for his COPD presented as transfer from [facility] with COVID-19 pneumonia and hypoxemia.  Patient does have history of COPD, he still smokes, patient states that he has been using about 4 L through concentrator, his daughter-in-law's oxygen for last many months.  Last few days he has been having shortness of breath and cough.  Also had low-grade fever.  He came to emergency room with this symptoms, was on 4 L of oxygen, after seen in the emergency room he felt well and left AGAINST MEDICAL ADVICE.  He came back to emergency room in the middle of the night as he could not breathe at home.  Clinical Impression  He presents with generalized weakness, poor insight into his medical situation and reduced safety awareness. He is at risk for falls, and has fallen recently. Lives with a daughter in law in one level home, and relates he was independent in all ADLs. No adverse effects during PT eval and treat. Currently on 9 HF [place] with SPO2 running 92-93%+. He was ambulated using RW to and from bathroom with wide base and shuffling pattern. At end of session, he was instructed in IS and Flutter units, only able to achieve 500 on IS- reminded to use both units hourly. Should benefit from skilled PT to further address goals for optimal functional outcomes.    Follow Up Recommendations No PT follow up(While he could potentially benefit from [facility]- recommend someone being able to check on him and he shows no interest in ongoing PT)    Equipment Recommendations  Rolling walker with 5" wheels(But he might not be receptive to this.)     Recommendations for Other Services       Precautions / Restrictions Precautions Precautions: Fall Precaution Comments: Monitor SpO2. 9L HFNC Restrictions Weight Bearing Restrictions: No      Mobility  Bed Mobility               General bed mobility comments: Pt seated EOB upon OT arrival.  Transfers Overall transfer level: Needs assistance Equipment used: Rolling walker (2 wheeled) Transfers: Sit to/from [facility] Sit to Stand: Supervision Stand pivot transfers: Supervision       General transfer comment: Requires close supervision for both safety and to monitor/manage lines  Ambulation/Gait Ambulation/Gait assistance: Supervision Gait Distance (Feet): 27 Feet(total to and from bathroom) Assistive device: Rolling walker (2 wheeled) Gait Pattern/deviations: Wide base of support;Shuffle        Stairs            Wheelchair Mobility    Modified Rankin (Stroke Patients Only)       Balance Overall balance assessment: Mild deficits observed, not formally tested                                           Pertinent Vitals/Pain Pain Assessment: No/denies pain    Home Living Family/patient expects to be discharged to:: Private residence Living Arrangements: Children(son, daughter in law, and grandchildren) Available Help at Discharge: Family Type of Home: Apartment Home Access: Level entry  Home Layout: One level Home Equipment: [place] - 4 wheels;Cane - single point      Prior Function Level of Independence: Independent with assistive device(s)         Comments: Pt independent with ADLs, IADLs, and mobility. Pt ambulates with a [id number]. Pt reports 2 falls in the last 6 months. Pt reports using daughter in laws oxygen for the last month.      Hand Dominance   Dominant Hand: Right    Extremity/Trunk Assessment   Upper Extremity Assessment Upper Extremity Assessment: Overall WFL for tasks assessed     Lower Extremity Assessment Lower Extremity Assessment: Defer to PT evaluation;Generalized weakness    Cervical / Trunk Assessment Cervical / Trunk Assessment: Kyphotic  Communication   Communication: No difficulties  Cognition Arousal/Alertness: Awake/alert Behavior During Therapy: WFL for tasks assessed/performed Overall Cognitive Status: Within Functional Limits for tasks assessed                                 General Comments: He requires max encouragement and reassurance to participate and presents with little initiative, and reduced insight into his current medical status      General Comments General comments (skin integrity, edema, etc.): Pt on 9L HFNC with SpO2 96% at rest. SpO2 decreased to 90% with activity. No reports of SOB.     Exercises General Exercises - Lower Extremity Ankle Circles/Pumps: AROM;Seated Quad Sets: AROM;Seated Short Arc Quad: AROM;Seated Hip ABduction/ADduction: AROM;Seated Other Exercises Other Exercises: Instructed in both IS and FLUTTER. Able to achieve only 500  in 10 out of 10 tries on IS. Good return dem with FLUTTER and reminded to practice hourly for 8-10 breaths   Assessment/Plan    PT Assessment Patient needs continued PT services  PT Problem List Decreased strength;Decreased activity tolerance;Cardiopulmonary status limiting activity;Decreased mobility       PT Treatment Interventions Gait training;Functional mobility training;Therapeutic activities;Therapeutic exercise;Patient/family education    PT Goals (Current goals can be found in the Care Plan section)  Acute Rehab PT Goals Patient Stated Goal: to go home PT Goal Formulation: With patient/family Potential to Achieve Goals: Fair    Frequency Min 3X/week   Barriers to discharge        Co-evaluation   Reason for Co-Treatment: Complexity of the patient's impairments (multi-system involvement);For patient/therapist safety   OT goals addressed during  session: ADL's and self-care       AM-PAC PT "6 Clicks" Mobility  Outcome Measure Help needed turning from your back to your side while in a flat bed without using bedrails?: None Help needed moving from lying on your back to sitting on the side of a flat bed without using bedrails?: None Help needed moving to and from a bed to a chair (including a wheelchair)?: A Little Help needed standing up from a chair using your arms (e.g., wheelchair or bedside chair)?: A Little Help needed to walk in hospital room?: A Little Help needed climbing 3-5 steps with a railing? : A Lot 6 Click Score: 19    End of Session   Activity Tolerance: Patient tolerated treatment well Patient left: in chair   PT Visit Diagnosis: Muscle weakness (generalized) (M62.81)    Time: [id number] PT Time Calculation (min) (ACUTE ONLY): 35 min   Charges:   PT Evaluation $PT Eval Moderate Complexity: 1 Mod PT Treatments $Gait Training: 8-22 mins        Rollen Sox, PT #  [phone] CGV cell   Casandra Doffing 12/23/2019, 11:15 AM

## 2019-12-23 NOTE — Progress Notes (Signed)
Occupational Therapy Evaluation Patient Details Name: Stanley Marks MRN: [id number] DOB: [birth year] Today's Date: 12/23/2019    History of Present Illness Stanley Marks is a 79 y.o. male with medical history significant of COPD, chronic systolic heart failure, chronic A. fib on Xarelto, hypertension, active smoker, informally using 4 L of oxygen at home from his daughter-in-law for his COPD presented as transfer from [facility] with COVID-19 pneumonia and hypoxemia.  Patient does have history of COPD, he still smokes, patient states that he has been using about 4 L through concentrator, his daughter-in-law's oxygen for last many months.  Last few days he has been having shortness of breath and cough.  Also had low-grade fever.  He came to emergency room with this symptoms, was on 4 L of oxygen, after seen in the emergency room he felt well and left AGAINST MEDICAL ADVICE.  He came back to emergency room in the middle of the night as he could not breathe at home.   Clinical Impression   PTA pt lived with his son and daughter in law, independent in ADL, IADL, and mobility tasks. Pt ambulates with a 4WW and reports 2 falls in the last 6 months. Pt reports using his daughter in laws oxygen at 4L for the last month with pt currently on 9L HFNC at the hospital. Pt currently independent to min assist for self-care and mobility tasks. Pt able to ambulate to/from bathroom with RW and supervision, noting 1 instance of right lateral loss of balance with pt able to self-correct. Pt engaged in simple grooming tasks as well as lower body dressing. Pt required encouragement to engage in therapy this date. SpO2 decreased to 90% on 9L HFNC during activity with no reports of shortness of breath. Educated pt on safety strategies to prevent future falls with fair understanding. Pt demonstrates decreased strength, endurance, balance, standing tolerance, activity tolerance, and safety awareness impacting ability to  complete self-care and functional transfer tasks. Recommend skilled OT services to address above deficits in order to promote function and prevent further decline.     Follow Up Recommendations  No OT follow up;Supervision - Intermittent    Equipment Recommendations  3 in 1 bedside commode(for use in shower)    Recommendations for Other Services       Precautions / Restrictions Precautions Precautions: Fall;Other (comment) Precaution Comments: Monitor SpO2. 9L HFNC Restrictions Weight Bearing Restrictions: No      Mobility Bed Mobility               General bed mobility comments: Pt seated EOB upon OT arrival.  Transfers Overall transfer level: Needs assistance Equipment used: Rolling walker (2 wheeled) Transfers: Sit to/from [facility] Sit to Stand: Supervision Stand pivot transfers: Supervision       General transfer comment: Supervision for safety and line management    Balance Overall balance assessment: Mild deficits observed, not formally tested                                         ADL either performed or assessed with clinical judgement   ADL Overall ADL's : Needs assistance/impaired Eating/Feeding: Independent;Sitting   Grooming: Supervision/safety;Set up;Standing   Upper Body Bathing: Set up;Supervision/ safety;Sitting   Lower Body Bathing: Minimal assistance;Sit to/from stand;Sitting/lateral leans   Upper Body Dressing : Supervision/safety;Set up;Sitting   Lower Body Dressing: Minimal assistance;Sit to/from stand;Sitting/lateral leans  Toilet Transfer: Supervision/safety;Set up;Grab bars;Ambulation;Regular Toilet   Toileting- Water quality scientist and Hygiene: Supervision/safety;Set up;Sit to/from stand;Sitting/lateral lean       Functional mobility during ADLs: Set up;Supervision/safety;Rolling walker General ADL Comments: Pt able to ambulate to/from bathroom with RW and supervision. Noted 1 instance  of right lateral LOB with pt able to self-correct.      Vision Baseline Vision/History: Wears glasses Wears Glasses: Reading only       Perception     Praxis      Pertinent Vitals/Pain Pain Assessment: No/denies pain     Hand Dominance Right   Extremity/Trunk Assessment Upper Extremity Assessment Upper Extremity Assessment: Overall WFL for tasks assessed   Lower Extremity Assessment Lower Extremity Assessment: Defer to PT evaluation       Communication Communication Communication: No difficulties   Cognition Arousal/Alertness: Awake/alert Behavior During Therapy: WFL for tasks assessed/performed Overall Cognitive Status: Within Functional Limits for tasks assessed                                 General Comments: Pt required encouragement to participate in therapy.   General Comments  Pt on 9L HFNC with SpO2 96% at rest. SpO2 decreased to 90% with activity. No reports of SOB.     Exercises     Shoulder Instructions      Home Living Family/patient expects to be discharged to:: Private residence Living Arrangements: Children(son, daughter in law, and grandchildren) Available Help at Discharge: Family Type of Home: Apartment Home Access: Level entry     Home Layout: One level     Bathroom Shower/Tub: Teacher, early years/pre: Standard     Home Equipment: Environmental consultant - 4 wheels;Cane - single point          Prior Functioning/Environment Level of Independence: Independent with assistive device(s)        Comments: Pt independent with ADLs, IADLs, and mobility. Pt ambulates with a [id number]. Pt reports 2 falls in the last 6 months. Pt reports using daughter in laws oxygen for the last month.         OT Problem List: Decreased strength;Decreased activity tolerance;Impaired balance (sitting and/or standing);Decreased safety awareness;Decreased knowledge of use of DME or AE;Cardiopulmonary status limiting activity      OT  Treatment/Interventions: Self-care/ADL training;Therapeutic exercise;Neuromuscular education;Energy conservation;DME and/or AE instruction;Therapeutic activities;Patient/family education;Balance training    OT Goals(Current goals can be found in the care plan section) Acute Rehab OT Goals Patient Stated Goal: to go home Time For Goal Achievement: 01/06/20 Potential to Achieve Goals: Good ADL Goals Pt Will Perform Grooming: with modified independence;standing Pt Will Perform Lower Body Bathing: with modified independence;sit to/from stand Pt Will Perform Lower Body Dressing: with modified independence;sit to/from stand Pt Will Transfer to Toilet: with modified independence;ambulating;regular height toilet Pt Will Perform Toileting - Clothing Manipulation and hygiene: with modified independence;sit to/from stand Additional ADL Goal #1: Pt to recall and verbalize 3 fall prevention strategies with 0 verbal cues. Additional ADL Goal #2: Pt to tolerate standing up to 10 min with modified independence and SpO2 maintaining in 90s, in preparation for ADLs.  OT Frequency: Min 3X/week   Barriers to D/C:            Co-evaluation PT/OT/SLP Co-Evaluation/Treatment: Yes Reason for Co-Treatment: Necessary to address cognition/behavior during functional activity;For patient/therapist safety   OT goals addressed during session: ADL's and self-care      AM-PAC OT "6 Clicks"  Daily Activity     Outcome Measure Help from another person eating meals?: None Help from another person taking care of personal grooming?: A Little Help from another person toileting, which includes using toliet, bedpan, or urinal?: A Little Help from another person bathing (including washing, rinsing, drying)?: A Little Help from another person to put on and taking off regular upper body clothing?: A Little Help from another person to put on and taking off regular lower body clothing?: A Little 6 Click Score: 19   End of  Session Equipment Utilized During Treatment: Rolling walker;Oxygen Nurse Communication: Mobility status  Activity Tolerance: Patient tolerated treatment well Patient left: in chair;with chair alarm set;with call bell/phone within reach  OT Visit Diagnosis: Unsteadiness on feet (R26.81);Muscle weakness (generalized) (M62.81)                Time: [id number] OT Time Calculation (min): 40 min Charges:  OT General Charges $OT Visit: 1 Visit OT Evaluation $OT Eval Low Complexity: 1 Low  Mauri Brooklyn OTR/L [place] 12/23/2019, 10:58 AM

## 2019-12-23 NOTE — Plan of Care (Signed)
No acute events during this shift. Pt remains on 6LNC with no s/s of SOB. No complaints during this shift. VSS and WDL for this pt.  Continue to monitor  Problem: Education: Goal: Knowledge of risk factors and measures for prevention of condition will improve Outcome: Progressing   Problem: Coping: Goal: Psychosocial and spiritual needs will be supported Outcome: Progressing   Problem: Respiratory: Goal: Will maintain a patent airway Outcome: Progressing Goal: Complications related to the disease process, condition or treatment will be avoided or minimized Outcome: Progressing   Problem: Education: Goal: Knowledge of General Education information will improve Description: Including pain rating scale, medication(s)/side effects and non-pharmacologic comfort measures Outcome: Progressing   Problem: Health Behavior/Discharge Planning: Goal: Ability to manage health-related needs will improve Outcome: Progressing   Problem: Clinical Measurements: Goal: Ability to maintain clinical measurements within normal limits will improve Outcome: Progressing Goal: Will remain free from infection Outcome: Progressing Goal: Diagnostic test results will improve Outcome: Progressing Goal: Respiratory complications will improve Outcome: Progressing Goal: Cardiovascular complication will be avoided Outcome: Progressing   Problem: Activity: Goal: Risk for activity intolerance will decrease Outcome: Progressing   Problem: Nutrition: Goal: Adequate nutrition will be maintained Outcome: Progressing   Problem: Coping: Goal: Level of anxiety will decrease Outcome: Progressing   Problem: Elimination: Goal: Will not experience complications related to bowel motility Outcome: Progressing Goal: Will not experience complications related to urinary retention Outcome: Progressing   Problem: Pain Managment: Goal: General experience of comfort will improve Outcome: Progressing   Problem:  Safety: Goal: Ability to remain free from injury will improve Outcome: Progressing   Problem: Skin Integrity: Goal: Risk for impaired skin integrity will decrease Outcome: Progressing

## 2019-12-23 NOTE — Progress Notes (Addendum)
Progress Note    Stanley Marks [id number] DOB: 1940-12-13 DOA: 12/22/2019  PCP: Perrin Maltese, MD  Patient coming from: Home via [facility].  I have personally briefly reviewed patient's old medical records available.   Chief Complaint: Shortness of breath  HPI: Stanley Marks is a 79 y.o. male with medical history significant of COPD, chronic systolic heart failure, chronic A. fib on Xarelto, hypertension, active smoker, informally using 4 L of oxygen at home from his daughter-in-law for his COPD presented as transfer from [facility] with COVID-19 pneumonia and hypoxemia.  Patient does have history of COPD, he still smokes, patient states that he has been using about 4 L through concentrator, his daughter-in-law's oxygen for last many months.  Last few days he has been having shortness of breath and cough.  Also had low-grade fever.  He came to emergency room with this symptoms, was on 4 L of oxygen, after seen in the emergency room he felt well and left AGAINST MEDICAL ADVICE.  He came back to emergency room in the middle of the night as he could not breathe at home. On arrival to emergency room he had low-grade temperature, blood pressure was stable.  Respiratory rate 22.  Oxygen was 94% on 6 L.  Covid positive.  WC count 18,000.  Chest x-ray showed some chronic changes. Patient was treated with remdesivir dexamethasone and Actemra at [facility]. When arriving to the [place], on my interview patient states that he is already feeling better and may be able to go home in 1 or 2 days.  Subjective: No acute issues or events overnight, continues to complain of shortness of breath with minimal exertion.  Denies chest pain, headache, fevers, chills.  Assessment/Plan Principal Problem:   Acute and chronic respiratory failure with hypoxia (HCC) Active Problems:   Chronic systolic CHF (congestive heart failure) (HCC)   Acute respiratory  failure due to COVID-19 [facility])   Atrial fibrillation, chronic (HCC)   Essential hypertension   Pneumonia due to COVID-19 virus   Acute on chronic hypoxic respiratory failure 2/2 COVID-19 virus pneumonia. Cannot rule out concurrent COPD exacerbation Agree with admission given severity of symptoms and underlying severe lung disease.   Continue incentive spirometry, deep breathing exercises, sputum induction, mucolytic's and bronchodilators. SpO2: 99 % O2 Flow Rate (L/min): 5 L/min Recent Labs    12/22/19 0524 12/23/19 0217  DDIMER  --  0.95*  FERRITIN 237 328  LDH 142  --   CRP 20.1* 21.6*  Covid directed therapy with, steroids, continue dexamethasone Continue remdesivir Actemra x1 given before transfer  Chronic hypoxemic respiratory failure Patient previously using oxygen at home without formal diagnosis.  After clinical improvement, patient may still need home oxygen evaluation.  Chronic diastolic heart failure, no acute exacerbation:  Patient is very well compensated.  He is on multiple regimen including Aldactone, Entresto, digoxin and torsemide.  We will continue this.  Will need close monitoring of renal functions and potassium levels.  Hypertension: Blood pressure is stable.  Chronic atrial fibrillation: Rate controlled.  Resume metoprolol and digoxin.  Therapeutic on Xarelto.   DVT prophylaxis: On Xarelto Code Status: Full code Family Communication: None Disposition Plan: Home after clinical improvement; continues to require high flow oxygen and desats with minimal movement/activity. Consults called: None Admission status: Inpatient telemetry.   Physical Exam: Vitals:   12/23/19 0300 12/23/19 0400 12/23/19 0500 12/23/19 0725  BP:  107/70  95/69  Pulse: (!) 41 (!) 105 Marland Kitchen)  102 100  Resp:  13 14 16   Temp:  98 F (36.7 C)    TempSrc:  Oral  Oral  SpO2: (!) 87% 93% 96% 93%  Weight:      Height:        Constitutional: NAD, calm, comfortable Vitals:   12/23/19  0300 12/23/19 0400 12/23/19 0500 12/23/19 0725  BP:  107/70  95/69  Pulse: (!) 41 (!) 105 (!) 102 100  Resp:  13 14 16   Temp:  98 F (36.7 C)    TempSrc:  Oral  Oral  SpO2: (!) 87% 93% 96% 93%  Weight:      Height:       General:  Pleasantly resting in bed, No acute distress. HEENT:  Normocephalic atraumatic.  Sclerae nonicteric, noninjected.  Extraocular movements intact bilaterally. Neck:  Without mass or deformity.  Trachea is midline. Lungs:  Clear to auscultate bilaterally without rhonchi, wheeze, or rales. Heart:  Regular rate and rhythm.  Without murmurs, rubs, or gallops. Abdomen:  Soft, nontender, nondistended.  Without guarding or rebound. Extremities: Without cyanosis, clubbing, edema, or obvious deformity. Vascular:  Dorsalis pedis and posterior tibial pulses palpable bilaterally. Skin:  Warm and dry, no erythema, no ulcerations.   Labs on Admission: I have personally reviewed following labs and imaging studies  CBC: Recent Labs  Lab 12/21/19 1345 12/22/19 0151 12/23/19 0217  WBC 13.6* 18.9* 16.3*  NEUTROABS  --   --  15.1*  HGB 15.7 15.1 14.7  HCT 49.0 46.6 47.2  MCV 94.6 93.8 97.5  PLT 205 204 [id number]   Basic Metabolic Panel: Recent Labs  Lab 12/21/19 1345 12/22/19 0151 12/23/19 0217  NA 140 138 142  K 4.2 4.1 4.2  CL 100 101 101  CO2 33* 29 32  GLUCOSE 131* 183* 114*  BUN 15 16 30*  CREATININE 1.26* 1.21 1.48*  CALCIUM 9.0 8.7* 8.7*  MG  --   --  2.4  PHOS  --   --  3.5   GFR: Estimated Creatinine Clearance: 62.3 mL/min (A) (by C-G formula based on SCr of 1.48 mg/dL (H)). Liver Function Tests: Recent Labs  Lab 12/22/19 0524 12/23/19 0217  AST 19 15  ALT 18 21  ALKPHOS 48 54  BILITOT 2.5* 1.1  PROT 7.1 6.5  ALBUMIN 3.1* 2.9*   Lipid Profile: Recent Labs    12/22/19 0151  TRIG 53   Anemia Panel: Recent Labs    12/22/19 0524 12/23/19 0217  FERRITIN 237 328    Radiological Exams on Admission: DG Chest 2 View  Result Date:  12/21/2019 CLINICAL DATA:  Shortness of breath EXAM: CHEST - 2 VIEW COMPARISON:  March 05, 2019 chest radiograph and chest CT FINDINGS: The interstitium is mildly thickened, stable. There is no appreciable edema or consolidation. Heart is upper normal in size with pulmonary vascularity normal. No adenopathy. There is degenerative change in the thoracic spine. IMPRESSION: Mild interstitial thickening which may be indicative of a degree of chronic bronchitis. No edema or airspace opacity evident. Stable cardiac silhouette. No evident adenopathy. Electronically Signed   By: Lowella Grip III M.D.   On: 12/21/2019 14:10     [facility] Pager [phone]

## 2019-12-24 LAB — FERRITIN: Ferritin: 331 ng/mL (ref 24–336)

## 2019-12-24 LAB — CBC WITH DIFFERENTIAL/PLATELET
Abs Immature Granulocytes: 0.07 10*3/uL (ref 0.00–0.07)
Basophils Absolute: 0 10*3/uL (ref 0.0–0.1)
Basophils Relative: 0 %
Eosinophils Absolute: 0 10*3/uL (ref 0.0–0.5)
Eosinophils Relative: 0 %
HCT: 50.5 % (ref 39.0–52.0)
Hemoglobin: 15.8 g/dL (ref 13.0–17.0)
Immature Granulocytes: 0 %
Lymphocytes Relative: 4 %
Lymphs Abs: 0.6 10*3/uL — ABNORMAL LOW (ref 0.7–4.0)
MCH: 30.2 pg (ref 26.0–34.0)
MCHC: 31.3 g/dL (ref 30.0–36.0)
MCV: 96.4 fL (ref 80.0–100.0)
Monocytes Absolute: 0.6 10*3/uL (ref 0.1–1.0)
Monocytes Relative: 4 %
Neutro Abs: 14.8 10*3/uL — ABNORMAL HIGH (ref 1.7–7.7)
Neutrophils Relative %: 92 %
Platelets: 272 10*3/uL (ref 150–400)
RBC: 5.24 MIL/uL (ref 4.22–5.81)
RDW: 15.8 % — ABNORMAL HIGH (ref 11.5–15.5)
WBC: 16.1 10*3/uL — ABNORMAL HIGH (ref 4.0–10.5)
nRBC: 0 % (ref 0.0–0.2)

## 2019-12-24 LAB — COMPREHENSIVE METABOLIC PANEL
ALT: 34 U/L (ref 0–44)
AST: 29 U/L (ref 15–41)
Albumin: 3 g/dL — ABNORMAL LOW (ref 3.5–5.0)
Alkaline Phosphatase: 66 U/L (ref 38–126)
Anion gap: 10 (ref 5–15)
BUN: 45 mg/dL — ABNORMAL HIGH (ref 8–23)
CO2: 29 mmol/L (ref 22–32)
Calcium: 8.9 mg/dL (ref 8.9–10.3)
Chloride: 101 mmol/L (ref 98–111)
Creatinine, Ser: 1.19 mg/dL (ref 0.61–1.24)
GFR calc Af Amer: >60 mL/min (ref >60)
GFR calc non Af Amer: 59 mL/min — ABNORMAL LOW (ref >60)
Glucose, Bld: 144 mg/dL — ABNORMAL HIGH (ref 70–99)
Potassium: 4.1 mmol/L (ref 3.5–5.1)
Sodium: 140 mmol/L (ref 135–145)
Total Bilirubin: 0.9 mg/dL (ref 0.3–1.2)
Total Protein: 6.8 g/dL (ref 6.5–8.1)

## 2019-12-24 LAB — PHOSPHORUS: Phosphorus: 2.7 mg/dL (ref 2.5–4.6)

## 2019-12-24 LAB — C-REACTIVE PROTEIN: CRP: 10.3 mg/dL — ABNORMAL HIGH (ref <1.0)

## 2019-12-24 LAB — D-DIMER, QUANTITATIVE: D-Dimer, Quant: 0.71 ug/mL-FEU — ABNORMAL HIGH (ref 0.00–0.50)

## 2019-12-24 LAB — MAGNESIUM: Magnesium: 2.5 mg/dL — ABNORMAL HIGH (ref 1.7–2.4)

## 2019-12-24 MED ORDER — DILTIAZEM HCL ER 60 MG PO CP12
60.0000 mg | ORAL_CAPSULE | Freq: Two times a day (BID) | ORAL | Status: DC
  Administered 2019-12-24 – 2019-12-26 (×5): 60 mg via ORAL
  Filled 2019-12-24 (×7): qty 1

## 2019-12-24 MED ORDER — DILTIAZEM HCL 25 MG/5ML IV SOLN
5.0000 mg | Freq: Once | INTRAVENOUS | Status: AC
  Administered 2019-12-24: 12:00:00 5 mg via INTRAVENOUS
  Filled 2019-12-24: qty 5

## 2019-12-24 MED ORDER — DILTIAZEM HCL ER 60 MG PO CP12
60.0000 mg | ORAL_CAPSULE | Freq: Two times a day (BID) | ORAL | Status: DC
  Filled 2019-12-24: qty 1

## 2019-12-24 MED ORDER — DILTIAZEM HCL 25 MG/5ML IV SOLN
10.0000 mg | Freq: Once | INTRAVENOUS | Status: AC
  Administered 2019-12-24: 12:00:00 10 mg via INTRAVENOUS
  Filled 2019-12-24: qty 5

## 2019-12-24 NOTE — Progress Notes (Signed)
Nurse to nurse report given to Elgie Collard, RN and patient transferred to third floor via wheelchair to room 310.

## 2019-12-24 NOTE — Progress Notes (Signed)
Progress Note    Stanley Marks [id number] DOB: 03/31/1941 DOA: 12/22/2019  PCP: Perrin Maltese, MD  Patient coming from: Home via [facility].  I have personally briefly reviewed patient's old medical records available.   Chief Complaint: Shortness of breath  HPI: Stanley Marks is a 79 y.o. male with medical history significant of COPD, chronic systolic heart failure, chronic A. fib on Xarelto, hypertension, active smoker, informally using 4 L of oxygen at home from his daughter-in-law for his COPD presented as transfer from [facility] with COVID-19 pneumonia and hypoxemia.  Patient does have history of COPD, he still smokes, patient states that he has been using about 4 L through concentrator, his daughter-in-law's oxygen for last many months.  Last few days he has been having shortness of breath and cough.  Also had low-grade fever.  He came to emergency room with this symptoms, was on 4 L of oxygen, after seen in the emergency room he felt well and left AGAINST MEDICAL ADVICE.  He came back to emergency room in the middle of the night as he could not breathe at home. On arrival to emergency room he had low-grade temperature, blood pressure was stable.  Respiratory rate 22.  Oxygen was 94% on 6 L.  Covid positive.  WC count 18,000.  Chest x-ray showed some chronic changes. Patient was treated with remdesivir dexamethasone and Actemra at [facility]. When arriving to the [place], on my interview patient states that he is already feeling better and may be able to go home in 1 or 2 days.  Subjective: No acute issues or events overnight, continues to complain of shortness of breath with minimal exertion.  Denies chest pain, headache, fevers, chills.  Assessment/Plan Principal Problem:   Acute and chronic respiratory failure with hypoxia (HCC) Active Problems:   Chronic systolic CHF (congestive heart failure) (HCC)   Acute respiratory  failure due to COVID-19 [facility])   Atrial fibrillation, chronic (HCC)   Essential hypertension   Pneumonia due to COVID-19 virus   Acute on chronic hypoxic respiratory failure 2/2 COVID-19 virus pneumonia. Cannot rule out concurrent COPD exacerbation Meets criteria for severe disease Continue incentive spirometry, deep breathing exercises, sputum induction, mucolytic's and bronchodilators. SpO2: 98 % O2 Flow Rate (L/min): 2 L/min Recent Labs    12/22/19 0524 12/23/19 0217 12/24/19 0304  DDIMER  --  0.95* 0.71*  FERRITIN 237 328 331  LDH 142  --   --   CRP 20.1* 21.6* 10.3*  Covid directed therapy with steroids, continue dexamethasone Continue remdesivir through 12/26/2019 Actemra x1 given prior to transfer to our facility  Chronic atrial fibrillation, currently in RVR Rate poorly controlled today after PT, rate remains in the 140s to 150s despite resting for an hour without exertion Discontinue metoprolol, transition to diltiazem with IV diltiazem pushes as needed  Consider diltiazem drip if patient becomes unstable or uncontrolled Continue digoxin.   Continue Xarelto.  Chronic hypoxemic respiratory failure Patient previously using oxygen at home without formal diagnosis.  After clinical improvement, patient may still need home oxygen evaluation.  Chronic diastolic heart failure, no acute exacerbation:  Patient is very well compensated.  He is on multiple regimen including Aldactone, Entresto, digoxin and torsemide.  We will continue this.  Will need close monitoring of renal functions and potassium levels.  Hypertension: Blood pressure is stable.    DVT prophylaxis: On Xarelto Code Status: Full code Family Communication: None Disposition Plan: Home after clinical improvement;  continues to require high flow oxygen and desats with minimal movement/activity. Consults called: None Admission status: Inpatient telemetry.   Physical Exam: Vitals:   12/24/19 0309 12/24/19 0409  12/24/19 0703 12/24/19 1030  BP: 100/83 98/67 (!) 119/94   Pulse: (!) 59 (!) 107 (!) 106 (!) 153  Resp: 18 18 20 20   Temp: 97.9 F (36.6 C) 97.6 F (36.4 C) 97.7 F (36.5 C)   TempSrc: Oral Axillary Oral   SpO2: 97% 96% 98% 98%  Weight:      Height:        Constitutional: NAD, calm, comfortable Vitals:   12/24/19 0309 12/24/19 0409 12/24/19 0703 12/24/19 1030  BP: 100/83 98/67 (!) 119/94   Pulse: (!) 59 (!) 107 (!) 106 (!) 153  Resp: 18 18 20 20   Temp: 97.9 F (36.6 C) 97.6 F (36.4 C) 97.7 F (36.5 C)   TempSrc: Oral Axillary Oral   SpO2: 97% 96% 98% 98%  Weight:      Height:       General:  Pleasantly resting in bed, No acute distress. HEENT:  Normocephalic atraumatic.  Sclerae nonicteric, noninjected.  Extraocular movements intact bilaterally. Neck:  Without mass or deformity.  Trachea is midline. Lungs:  Clear to auscultate bilaterally without rhonchi, wheeze, or rales. Heart:  Regular rate and rhythm.  Without murmurs, rubs, or gallops. Abdomen:  Soft, nontender, nondistended.  Without guarding or rebound. Extremities: Without cyanosis, clubbing, edema, or obvious deformity. Vascular:  Dorsalis pedis and posterior tibial pulses palpable bilaterally. Skin:  Warm and dry, no erythema, no ulcerations.   Labs on Admission: I have personally reviewed following labs and imaging studies  CBC: Recent Labs  Lab 12/21/19 1345 12/22/19 0151 12/23/19 0217 12/24/19 0304  WBC 13.6* 18.9* 16.3* 16.1*  NEUTROABS  --   --  15.1* 14.8*  HGB 15.7 15.1 14.7 15.8  HCT 49.0 46.6 47.2 50.5  MCV 94.6 93.8 97.5 96.4  PLT 205 204 205 [id number]   Basic Metabolic Panel: Recent Labs  Lab 12/21/19 1345 12/22/19 0151 12/23/19 0217 12/24/19 0304  NA 140 138 142 140  K 4.2 4.1 4.2 4.1  CL 100 101 101 101  CO2 33* 29 32 29  GLUCOSE 131* 183* 114* 144*  BUN 15 16 30* 45*  CREATININE 1.26* 1.21 1.48* 1.19  CALCIUM 9.0 8.7* 8.7* 8.9  MG  --   --  2.4 2.5*  PHOS  --   --  3.5 2.7    GFR: Estimated Creatinine Clearance: 77.5 mL/min (by C-G formula based on SCr of 1.19 mg/dL). Liver Function Tests: Recent Labs  Lab 12/22/19 0524 12/23/19 0217 12/24/19 0304  AST 19 15 29   ALT 18 21 34  ALKPHOS 48 54 66  BILITOT 2.5* 1.1 0.9  PROT 7.1 6.5 6.8  ALBUMIN 3.1* 2.9* 3.0*   Lipid Profile: Recent Labs    12/22/19 0151  TRIG 53   Anemia Panel: Recent Labs    12/23/19 0217 12/24/19 0304  FERRITIN 328 331    Radiological Exams on Admission: No results found.   Little Ishikawa DO [facility] Pager [phone]

## 2019-12-24 NOTE — Progress Notes (Signed)
Paged Dr. Avon Gully and messaged in [place] about sustained HR of 140-150s after activity.  Patient remains asymptomatic with high HR.  Awaiting call back.

## 2019-12-24 NOTE — Progress Notes (Signed)
SATURATION QUALIFICATIONS: (This note is used to comply with regulatory documentation for home oxygen)  Patient Saturations on Room Air at Rest = 85%  Patient Saturations on Room Air while Ambulating = 83%  Patient Saturations on 3 Liters of oxygen while Ambulating = 91%  Please briefly explain why patient needs home oxygen: Patient desaturates with any physical activity.

## 2019-12-24 NOTE — Progress Notes (Signed)
Spoke with Dr. Avon Gully.  Awaiting new orders.

## 2019-12-24 NOTE — Plan of Care (Signed)

## 2019-12-24 NOTE — Progress Notes (Signed)
2nd attempt to page and message Dr. Avon Gully regarding the patient's HR sustained in the 140-150 range.  Patient remains asymptomatic.

## 2019-12-24 NOTE — Progress Notes (Signed)
Physical Therapy Treatment Patient Details Name: Stanley Marks MRN: [id number] DOB: 1941/09/29 Today's Date: 12/24/2019    History of Present Illness Stanley Marks is a 79 y.o. male with medical history significant of COPD, chronic systolic heart failure, chronic A. fib on Xarelto, hypertension, active smoker, informally using 4 L of oxygen at home from his daughter-in-law for his COPD presented as transfer from [facility] with COVID-19 pneumonia and hypoxemia.  Patient does have history of COPD, he still smokes, patient states that he has been using about 4 L through concentrator, his daughter-in-law's oxygen for last many months.  Last few days he has been having shortness of breath and cough.  Also had low-grade fever.  He came to emergency room with this symptoms, was on 4 L of oxygen, after seen in the emergency room he felt well and left AGAINST MEDICAL ADVICE.  He came back to emergency room in the middle of the night as he could not breathe at home.    PT Comments    Very pleasant male patient with little initiative and reduced safety awareness. He has falls by history. He is impulsive and states "he can walk by himself in the room and does not understand why they will not let me"> Instructed in HEP and printed ex sheets were issued with full return demo- also two resistance levels of theraband were also issued. Practiced IS and able to achieve 1000. Good return demo on FLUTTER unit. He is currently on 2 LPM, [place]. Should continue to benefit from skilled PT to further address goals for optimal functional outcomes. He anticipates discharge by the weekend.  Follow Up Recommendations  No PT follow up(While he could benefit from [facility] he is not receptive to this and has reduced safety awareness. He does have a falls history)     Equipment Recommendations  Rolling walker with 5" wheels    Recommendations for Other Services       Precautions / Restrictions Precautions Precautions:  Fall Restrictions Weight Bearing Restrictions: No    Mobility  Bed Mobility               General bed mobility comments: Pt seated on EOB when PT arrived at 1:30 pm  Transfers Overall transfer level: Needs assistance Equipment used: Rolling walker (2 wheeled) Transfers: Sit to/from [facility] Sit to Stand: Supervision Stand pivot transfers: Supervision       General transfer comment: Requires close supervision for both safety and to monitor/manage lines  Ambulation/Gait Ambulation/Gait assistance: Supervision               Stairs             Wheelchair Mobility    Modified Rankin (Stroke Patients Only)       Balance Overall balance assessment: Mild deficits observed, not formally tested                                          Cognition Arousal/Alertness: Awake/alert   Overall Cognitive Status: Within Functional Limits for tasks assessed                                 General Comments: He requires max encouragement and reassurance to participate and presents with little initiative, and reduced insight into his current medical status  Exercises Total Joint Exercises Ankle Circles/Pumps: AROM;Seated Short Arc Quad: AROM;Seated Heel Slides: AROM;Seated Straight Leg Raises: AROM;Supine Marching in Standing: AROM;Seated Other Exercises Other Exercises: Instructed in both IS and FLUTTER. Able to achieve only 1100   in 10 out of 10 tries on IS. Good return dem with FLUTTER and reminded to practice hourly for 8-10 breaths    General Comments        Pertinent Vitals/Pain      Home Living  See OT and PT evaluations.                    Prior Function            PT Goals (current goals can now be found in the care plan section) Acute Rehab PT Goals Patient Stated Goal: to go home PT Goal Formulation: With patient/family Potential to Achieve Goals: Fair    Frequency     3  x weekly      PT Plan  Continue as outlined    Co-evaluation              AM-PAC PT "6 Clicks" Mobility   Outcome Measure  Help needed turning from your back to your side while in a flat bed without using bedrails?: None Help needed moving from lying on your back to sitting on the side of a flat bed without using bedrails?: None   Help needed standing up from a chair using your arms (e.g., wheelchair or bedside chair)?: A Little Help needed to walk in hospital room?: A Little Help needed climbing 3-5 steps with a railing? : A Lot 6 Click Score: 16    End of Session         PT Visit Diagnosis: Muscle weakness (generalized) (M62.81)     Time: 0130-0200 PT Time Calculation (min) (ACUTE ONLY): 30 min  Charges:  $Therapeutic Exercise: 23-37 mins                     Rollen Sox, PT # [phone] CGV cell    Casandra Doffing 12/24/2019, 4:20 PM

## 2019-12-25 LAB — COMPREHENSIVE METABOLIC PANEL
ALT: 92 U/L — ABNORMAL HIGH (ref 0–44)
AST: 69 U/L — ABNORMAL HIGH (ref 15–41)
Albumin: 2.7 g/dL — ABNORMAL LOW (ref 3.5–5.0)
Alkaline Phosphatase: 67 U/L (ref 38–126)
Anion gap: 12 (ref 5–15)
BUN: 45 mg/dL — ABNORMAL HIGH (ref 8–23)
CO2: 26 mmol/L (ref 22–32)
Calcium: 8.5 mg/dL — ABNORMAL LOW (ref 8.9–10.3)
Chloride: 103 mmol/L (ref 98–111)
Creatinine, Ser: 1.35 mg/dL — ABNORMAL HIGH (ref 0.61–1.24)
GFR calc Af Amer: 59 mL/min — ABNORMAL LOW (ref >60)
GFR calc non Af Amer: 51 mL/min — ABNORMAL LOW (ref >60)
Glucose, Bld: 133 mg/dL — ABNORMAL HIGH (ref 70–99)
Potassium: 4.6 mmol/L (ref 3.5–5.1)
Sodium: 141 mmol/L (ref 135–145)
Total Bilirubin: 0.7 mg/dL (ref 0.3–1.2)
Total Protein: 6.1 g/dL — ABNORMAL LOW (ref 6.5–8.1)

## 2019-12-25 LAB — CBC WITH DIFFERENTIAL/PLATELET
Abs Immature Granulocytes: 0.04 10*3/uL (ref 0.00–0.07)
Basophils Absolute: 0 10*3/uL (ref 0.0–0.1)
Basophils Relative: 0 %
Eosinophils Absolute: 0 10*3/uL (ref 0.0–0.5)
Eosinophils Relative: 0 %
HCT: 50.8 % (ref 39.0–52.0)
Hemoglobin: 16 g/dL (ref 13.0–17.0)
Immature Granulocytes: 0 %
Lymphocytes Relative: 5 %
Lymphs Abs: 0.6 10*3/uL — ABNORMAL LOW (ref 0.7–4.0)
MCH: 30.2 pg (ref 26.0–34.0)
MCHC: 31.5 g/dL (ref 30.0–36.0)
MCV: 95.8 fL (ref 80.0–100.0)
Monocytes Absolute: 0.5 10*3/uL (ref 0.1–1.0)
Monocytes Relative: 4 %
Neutro Abs: 12.9 10*3/uL — ABNORMAL HIGH (ref 1.7–7.7)
Neutrophils Relative %: 91 %
Platelets: 259 10*3/uL (ref 150–400)
RBC: 5.3 MIL/uL (ref 4.22–5.81)
RDW: 15.9 % — ABNORMAL HIGH (ref 11.5–15.5)
WBC: 14.1 10*3/uL — ABNORMAL HIGH (ref 4.0–10.5)
nRBC: 0 % (ref 0.0–0.2)

## 2019-12-25 LAB — C-REACTIVE PROTEIN: CRP: 3.8 mg/dL — ABNORMAL HIGH (ref <1.0)

## 2019-12-25 LAB — MAGNESIUM: Magnesium: 2.4 mg/dL (ref 1.7–2.4)

## 2019-12-25 LAB — PHOSPHORUS: Phosphorus: 3.1 mg/dL (ref 2.5–4.6)

## 2019-12-25 LAB — D-DIMER, QUANTITATIVE: D-Dimer, Quant: 0.51 ug/mL-FEU — ABNORMAL HIGH (ref 0.00–0.50)

## 2019-12-25 LAB — FERRITIN: Ferritin: 402 ng/mL — ABNORMAL HIGH (ref 24–336)

## 2019-12-25 NOTE — Progress Notes (Signed)
Progress Note    Stanley Marks [id number] DOB: [birth year] DOA: 12/22/2019  PCP: Perrin Maltese, MD  Patient coming from: Home via [facility].  I have personally briefly reviewed patient's old medical records available.   Chief Complaint: Shortness of breath  HPI: Stanley Marks is a 79 y.o. male with medical history significant of COPD, chronic systolic heart failure, chronic A. fib on Xarelto, hypertension, active smoker, informally using 4 L of oxygen at home from his daughter-in-law for his COPD presented as transfer from [facility] with COVID-19 pneumonia and hypoxemia.  Patient does have history of COPD, he still smokes, patient states that he has been using about 4 L through concentrator, his daughter-in-law's oxygen for last many months.  Last few days he has been having shortness of breath and cough.  Also had low-grade fever.  He came to emergency room with this symptoms, was on 4 L of oxygen, after seen in the emergency room he felt well and left AGAINST MEDICAL ADVICE.  He came back to emergency room in the middle of the night as he could not breathe at home. On arrival to emergency room he had low-grade temperature, blood pressure was stable.  Respiratory rate 22.  Oxygen was 94% on 6 L.  Covid positive.  WC count 18,000.  Chest x-ray showed some chronic changes. Patient was treated with remdesivir dexamethasone and Actemra at [facility].   Subjective: No acute issues or events overnight, continues to complain of shortness of breath with minimal exertion.  Denies chest pain, headache, fevers, chills.  Assessment/Plan Principal Problem:   Acute and chronic respiratory failure with hypoxia (HCC) Active Problems:   Chronic systolic CHF (congestive heart failure) (HCC)   Acute respiratory failure due to COVID-19 [facility])   Atrial fibrillation, chronic (HCC)   Essential hypertension   Pneumonia due to COVID-19 virus   Acute on chronic  hypoxic respiratory failure 2/2 COVID-19 virus pneumonia. Cannot rule out concurrent COPD exacerbation Meets criteria for severe disease Continue incentive spirometry, deep breathing exercises, sputum induction, mucolytic's and bronchodilators. Patient satting low 90s on room air, follow ambulatory O2 screen Recent Labs    12/23/19 0217 12/24/19 0304 12/25/19 0407  DDIMER 0.95* 0.71* 0.51*  FERRITIN 328 331 402*  CRP 21.6* 10.3* 3.8*  Covid directed therapy with steroids, continue dexamethasone Continue remdesivir through 12/26/2019 Actemra x1 given prior to transfer to our facility  Chronic atrial fibrillation, currently in RVR Rate poorly controlled today after PT, rate remains in the 140s to 150s despite resting for an hour without exertion Discontinue metoprolol, transition to diltiazem with IV diltiazem pushes as needed  Consider diltiazem drip if patient becomes unstable or uncontrolled Continue digoxin.   Continue Xarelto.  Chronic hypoxemic respiratory failure Patient previously using oxygen at home without formal diagnosis.  After clinical improvement, patient may still need home oxygen evaluation.  Chronic diastolic heart failure, no acute exacerbation:  Patient is very well compensated.  He is on multiple regimen including Aldactone, Entresto, digoxin and torsemide.  We will continue this.  Will need close monitoring of renal functions and potassium levels.  Hypertension: Blood pressure is stable.    DVT prophylaxis: On Xarelto Code Status: Full code Family Communication: None Disposition Plan: Likely home in the next 24 to 48 hours pending clinical course/resolution of hypoxia, patient will complete Remdesivir on 12/26/2019 Consults called: None Admission status: Inpatient telemetry.   Physical Exam: Vitals:   12/24/19 1613 12/24/19 1910 12/25/19 0526 12/25/19 [id number]  BP: 107/61 (!) 97/54 102/60 110/77  Pulse: 96 95 90 80  Resp: 19 20 20    Temp: 97.6 F (36.4  C) 98 F (36.7 C) 97.9 F (36.6 C) (!) 97.5 F (36.4 C)  TempSrc: Oral Oral  Oral  SpO2: 97% 90% 92% (!) 88%  Weight:      Height:        Constitutional: NAD, calm, comfortable Vitals:   12/24/19 1613 12/24/19 1910 12/25/19 0526 12/25/19 0829  BP: 107/61 (!) 97/54 102/60 110/77  Pulse: 96 95 90 80  Resp: 19 20 20    Temp: 97.6 F (36.4 C) 98 F (36.7 C) 97.9 F (36.6 C) (!) 97.5 F (36.4 C)  TempSrc: Oral Oral  Oral  SpO2: 97% 90% 92% (!) 88%  Weight:      Height:       General:  Pleasantly resting in bed, No acute distress. HEENT:  Normocephalic atraumatic.  Sclerae nonicteric, noninjected.  Extraocular movements intact bilaterally. Neck:  Without mass or deformity.  Trachea is midline. Lungs:  Clear to auscultate bilaterally without rhonchi, wheeze, or rales. Heart:  Regular rate and rhythm.  Without murmurs, rubs, or gallops. Abdomen:  Soft, nontender, nondistended.  Without guarding or rebound. Extremities: Without cyanosis, clubbing, edema, or obvious deformity. Vascular:  Dorsalis pedis and posterior tibial pulses palpable bilaterally. Skin:  Warm and dry, no erythema, no ulcerations.   Labs on Admission: I have personally reviewed following labs and imaging studies  CBC: Recent Labs  Lab 12/21/19 1345 12/22/19 0151 12/23/19 0217 12/24/19 0304 12/25/19 0407  WBC 13.6* 18.9* 16.3* 16.1* 14.1*  NEUTROABS  --   --  15.1* 14.8* 12.9*  HGB 15.7 15.1 14.7 15.8 16.0  HCT 49.0 46.6 47.2 50.5 50.8  MCV 94.6 93.8 97.5 96.4 95.8  PLT 205 204 205 272 [id number]   Basic Metabolic Panel: Recent Labs  Lab 12/21/19 1345 12/22/19 0151 12/23/19 0217 12/24/19 0304 12/25/19 0407  NA 140 138 142 140 141  K 4.2 4.1 4.2 4.1 4.6  CL 100 101 101 101 103  CO2 33* 29 32 29 26  GLUCOSE 131* 183* 114* 144* 133*  BUN 15 16 30* 45* 45*  CREATININE 1.26* 1.21 1.48* 1.19 1.35*  CALCIUM 9.0 8.7* 8.7* 8.9 8.5*  MG  --   --  2.4 2.5* 2.4  PHOS  --   --  3.5 2.7 3.1   GFR:  Estimated Creatinine Clearance: 68.3 mL/min (A) (by C-G formula based on SCr of 1.35 mg/dL (H)). Liver Function Tests: Recent Labs  Lab 12/22/19 0524 12/23/19 0217 12/24/19 0304 12/25/19 0407  AST 19 15 29  69*  ALT 18 21 34 92*  ALKPHOS 48 54 66 67  BILITOT 2.5* 1.1 0.9 0.7  PROT 7.1 6.5 6.8 6.1*  ALBUMIN 3.1* 2.9* 3.0* 2.7*   Lipid Profile: No results for input(s): CHOL, HDL, LDLCALC, TRIG, CHOLHDL, LDLDIRECT in the last 72 hours. Anemia Panel: Recent Labs    12/24/19 0304 12/25/19 0407  FERRITIN 331 402*    Radiological Exams on Admission: No results found.   Little Ishikawa DO [facility] Pager [phone]

## 2019-12-25 NOTE — Plan of Care (Signed)
   Vital Signs MEWS/VS Documentation       12/25/2019 0829 12/25/2019 1700 12/25/2019 2017 12/25/2019 2200   MEWS Score:  0  0  0  0   MEWS Score Color:  Green  Green  Green  Green   Resp:  --  --  16  --   Pulse:  80  --  62  --   BP:  110/77  116/76  (!) 115/59  --   Temp:  (!) 97.5 F (36.4 C)  98.2 F (36.8 C)  (!) 97.5 F (36.4 C)  --   O2 Device:  Room Air  Room Air  Nasal Cannula  --   O2 Flow Rate (L/min):  --  --  2 L/min  --   Level of Consciousness:  Alert  Alert  --  Alert        POC reviewed with pt.     Stanley Marks 12/25/2019,11:25 PM

## 2019-12-25 NOTE — Plan of Care (Signed)
Pt A&Ox4. VSS, SpO2 >88% on RA. No c/o pain throughout shift. Ambulating w/in room independently. Utilizing urinal at bedside.   Will report to oncoming shift.  Tomie China    Problem: Education: Goal: Knowledge of risk factors and measures for prevention of condition will improve Outcome: Progressing   Problem: Coping: Goal: Psychosocial and spiritual needs will be supported Outcome: Progressing   Problem: Respiratory: Goal: Will maintain a patent airway Outcome: Progressing Goal: Complications related to the disease process, condition or treatment will be avoided or minimized Outcome: Progressing   Problem: Education: Goal: Knowledge of General Education information will improve Description: Including pain rating scale, medication(s)/side effects and non-pharmacologic comfort measures Outcome: Progressing   Problem: Health Behavior/Discharge Planning: Goal: Ability to manage health-related needs will improve Outcome: Progressing   Problem: Clinical Measurements: Goal: Ability to maintain clinical measurements within normal limits will improve Outcome: Progressing Goal: Will remain free from infection Outcome: Progressing Goal: Diagnostic test results will improve Outcome: Progressing Goal: Respiratory complications will improve Outcome: Progressing Goal: Cardiovascular complication will be avoided Outcome: Progressing   Problem: Activity: Goal: Risk for activity intolerance will decrease Outcome: Progressing   Problem: Nutrition: Goal: Adequate nutrition will be maintained Outcome: Progressing   Problem: Coping: Goal: Level of anxiety will decrease Outcome: Progressing   Problem: Elimination: Goal: Will not experience complications related to bowel motility Outcome: Progressing Goal: Will not experience complications related to urinary retention Outcome: Progressing   Problem: Pain Managment: Goal: General experience of comfort will  improve Outcome: Progressing   Problem: Safety: Goal: Ability to remain free from injury will improve Outcome: Progressing   Problem: Skin Integrity: Goal: Risk for impaired skin integrity will decrease Outcome: Progressing

## 2019-12-26 LAB — COMPREHENSIVE METABOLIC PANEL
ALT: 132 U/L — ABNORMAL HIGH (ref 0–44)
AST: 53 U/L — ABNORMAL HIGH (ref 15–41)
Albumin: 3 g/dL — ABNORMAL LOW (ref 3.5–5.0)
Alkaline Phosphatase: 82 U/L (ref 38–126)
Anion gap: 11 (ref 5–15)
BUN: 46 mg/dL — ABNORMAL HIGH (ref 8–23)
CO2: 29 mmol/L (ref 22–32)
Calcium: 9 mg/dL (ref 8.9–10.3)
Chloride: 100 mmol/L (ref 98–111)
Creatinine, Ser: 1.28 mg/dL — ABNORMAL HIGH (ref 0.61–1.24)
GFR calc Af Amer: >60 mL/min (ref >60)
GFR calc non Af Amer: 54 mL/min — ABNORMAL LOW (ref >60)
Glucose, Bld: 141 mg/dL — ABNORMAL HIGH (ref 70–99)
Potassium: 4.8 mmol/L (ref 3.5–5.1)
Sodium: 140 mmol/L (ref 135–145)
Total Bilirubin: 0.7 mg/dL (ref 0.3–1.2)
Total Protein: 6.4 g/dL — ABNORMAL LOW (ref 6.5–8.1)

## 2019-12-26 LAB — CBC WITH DIFFERENTIAL/PLATELET
Abs Immature Granulocytes: 0.1 10*3/uL — ABNORMAL HIGH (ref 0.00–0.07)
Basophils Absolute: 0 10*3/uL (ref 0.0–0.1)
Basophils Relative: 0 %
Eosinophils Absolute: 0 10*3/uL (ref 0.0–0.5)
Eosinophils Relative: 0 %
HCT: 52.5 % — ABNORMAL HIGH (ref 39.0–52.0)
Hemoglobin: 16.4 g/dL (ref 13.0–17.0)
Immature Granulocytes: 1 %
Lymphocytes Relative: 5 %
Lymphs Abs: 0.7 10*3/uL (ref 0.7–4.0)
MCH: 30.2 pg (ref 26.0–34.0)
MCHC: 31.2 g/dL (ref 30.0–36.0)
MCV: 96.7 fL (ref 80.0–100.0)
Monocytes Absolute: 0.9 10*3/uL (ref 0.1–1.0)
Monocytes Relative: 6 %
Neutro Abs: 13.3 10*3/uL — ABNORMAL HIGH (ref 1.7–7.7)
Neutrophils Relative %: 88 %
Platelets: 288 10*3/uL (ref 150–400)
RBC: 5.43 MIL/uL (ref 4.22–5.81)
RDW: 15.9 % — ABNORMAL HIGH (ref 11.5–15.5)
WBC: 15 10*3/uL — ABNORMAL HIGH (ref 4.0–10.5)
nRBC: 0 % (ref 0.0–0.2)

## 2019-12-26 LAB — C-REACTIVE PROTEIN: CRP: 2 mg/dL — ABNORMAL HIGH (ref <1.0)

## 2019-12-26 LAB — D-DIMER, QUANTITATIVE: D-Dimer, Quant: 0.51 ug/mL-FEU — ABNORMAL HIGH (ref 0.00–0.50)

## 2019-12-26 LAB — PHOSPHORUS: Phosphorus: 3.1 mg/dL (ref 2.5–4.6)

## 2019-12-26 LAB — MAGNESIUM: Magnesium: 2.5 mg/dL — ABNORMAL HIGH (ref 1.7–2.4)

## 2019-12-26 LAB — FERRITIN: Ferritin: 296 ng/mL (ref 24–336)

## 2019-12-26 MED ORDER — DILTIAZEM HCL ER 60 MG PO CP12: 60.0000 mg | ORAL_CAPSULE | Freq: Two times a day (BID) | ORAL | 1 refills | Status: DC

## 2019-12-26 MED ORDER — PREDNISONE 10 MG PO TABS: ORAL_TABLET | ORAL | 0 refills | Status: AC

## 2019-12-26 NOTE — Progress Notes (Signed)
SATURATION QUALIFICATIONS: (This note is used to comply with regulatory documentation for home oxygen)  Patient Saturations on Room Air at Rest = 91%  Patient Saturations on Room Air while Ambulating = 90%  Patient Saturations on 1 Liters of oxygen while Ambulating = 94%  Please briefly explain why patient needs home oxygen:   Pt does NOT require home oxygen  Stanley Marks

## 2019-12-26 NOTE — Plan of Care (Signed)
PT A&*Ox4. VSS, SpO2 >88% on RA. Ambulatory O2 test complete, see notes for details. No c/o pain throughout shift. Utilizing urinal at bedside to void.  Attempted to contact pts family regarding update & unable to reach  PTAR contacted for pt transportation. Arrived to unit & pt left w/ VSS & all belongings  Tomie China    Problem: Education: Goal: Knowledge of risk factors and measures for prevention of condition will improve Outcome: Adequate for Discharge   Problem: Coping: Goal: Psychosocial and spiritual needs will be supported Outcome: Adequate for Discharge   Problem: Respiratory: Goal: Will maintain a patent airway Outcome: Adequate for Discharge Goal: Complications related to the disease process, condition or treatment will be avoided or minimized Outcome: Adequate for Discharge   Problem: Education: Goal: Knowledge of General Education information will improve Description: Including pain rating scale, medication(s)/side effects and non-pharmacologic comfort measures Outcome: Adequate for Discharge   Problem: Health Behavior/Discharge Planning: Goal: Ability to manage health-related needs will improve Outcome: Adequate for Discharge   Problem: Clinical Measurements: Goal: Ability to maintain clinical measurements within normal limits will improve Outcome: Adequate for Discharge Goal: Will remain free from infection Outcome: Adequate for Discharge Goal: Diagnostic test results will improve Outcome: Adequate for Discharge Goal: Respiratory complications will improve Outcome: Adequate for Discharge Goal: Cardiovascular complication will be avoided Outcome: Adequate for Discharge   Problem: Activity: Goal: Risk for activity intolerance will decrease Outcome: Adequate for Discharge   Problem: Nutrition: Goal: Adequate nutrition will be maintained Outcome: Adequate for Discharge   Problem: Coping: Goal: Level of anxiety will decrease Outcome: Adequate  for Discharge   Problem: Elimination: Goal: Will not experience complications related to bowel motility Outcome: Adequate for Discharge Goal: Will not experience complications related to urinary retention Outcome: Adequate for Discharge   Problem: Pain Managment: Goal: General experience of comfort will improve Outcome: Adequate for Discharge   Problem: Safety: Goal: Ability to remain free from injury will improve Outcome: Adequate for Discharge   Problem: Skin Integrity: Goal: Risk for impaired skin integrity will decrease Outcome: Adequate for Discharge

## 2019-12-26 NOTE — TOC Transition Note (Signed)
Transition of Care [facility]) - CM/SW Discharge Note   Patient Details  Name: Giomar Rohs MRN: [id number] Date of Birth: [birth year]  Transition of [facility]) CM/SW Contact:  Atilano Median, LCSW Phone Number: 12/26/2019, 11:51 AM   Clinical Narrative:    Discharged home. Patient refused home health. PTAR arranged. Patient states he has a key to get into his home; CSW unable to successfully reach patient's family. No other needs at this time. Case closed to this CSW.    Final next level of care: Home/Self Care     Patient Goals and CMS Choice        Discharge Placement                Patient to be transferred to facility by: [place] Name of family member notified: patient    Discharge Plan and Services                          HH Arranged: Patient Refused [facility]          Social Determinants of Health ([place]) Interventions     Readmission Risk Interventions No flowsheet data found.

## 2019-12-26 NOTE — Discharge Instructions (Signed)
COVID-19 COVID-19 is a respiratory infection that is caused by a virus called severe acute respiratory syndrome coronavirus 2 (SARS-CoV-2). The disease is also known as coronavirus disease or novel coronavirus. In some people, the virus may not cause any symptoms. In others, it may cause a serious infection. The infection can get worse quickly and can lead to complications, such as:  Pneumonia, or infection of the lungs.  Acute respiratory distress syndrome or ARDS. This is a condition in which fluid build-up in the lungs prevents the lungs from filling with air and passing oxygen into the blood.  Acute respiratory failure. This is a condition in which there is not enough oxygen passing from the lungs to the body or when carbon dioxide is not passing from the lungs out of the body.  Sepsis or septic shock. This is a serious bodily reaction to an infection.  Blood clotting problems.  Secondary infections due to bacteria or fungus.  Organ failure. This is when your body's organs stop working. The virus that causes COVID-19 is contagious. This means that it can spread from person to person through droplets from coughs and sneezes (respiratory secretions). What are the causes? This illness is caused by a virus. You may catch the virus by:  Breathing in droplets from an infected person. Droplets can be spread by a person breathing, speaking, singing, coughing, or sneezing.  Touching something, like a table or a doorknob, that was exposed to the virus (contaminated) and then touching your mouth, nose, or eyes. What increases the risk? Risk for infection You are more likely to be infected with this virus if you:  Are within 6 feet (2 meters) of a person with COVID-19.  Provide care for or live with a person who is infected with COVID-19.  Spend time in crowded indoor spaces or live in shared housing. Risk for serious illness You are more likely to become seriously ill from the virus if you:   Are 50 years of age or older. The higher your age, the more you are at risk for serious illness.  Live in a nursing home or long-term care facility.  Have cancer.  Have a long-term (chronic) disease such as: ? Chronic lung disease, including chronic obstructive pulmonary disease or asthma. ? A long-term disease that lowers your body's ability to fight infection (immunocompromised). ? Heart disease, including heart failure, a condition in which the arteries that lead to the heart become narrow or blocked (coronary artery disease), a disease which makes the heart muscle thick, weak, or stiff (cardiomyopathy). ? Diabetes. ? Chronic kidney disease. ? Sickle cell disease, a condition in which red blood cells have an abnormal "sickle" shape. ? Liver disease.  Are obese. What are the signs or symptoms? Symptoms of this condition can range from mild to severe. Symptoms may appear any time from 2 to 14 days after being exposed to the virus. They include:  A fever or chills.  A cough.  Difficulty breathing.  Headaches, body aches, or muscle aches.  Runny or stuffy (congested) nose.  A sore throat.  New loss of taste or smell. Some people may also have stomach problems, such as nausea, vomiting, or diarrhea. Other people may not have any symptoms of COVID-19. How is this diagnosed? This condition may be diagnosed based on:  Your signs and symptoms, especially if: ? You live in an area with a COVID-19 outbreak. ? You recently traveled to or from an area where the virus is common. ? You   provide care for or live with a person who was diagnosed with COVID-19. ? You were exposed to a person who was diagnosed with COVID-19.  A physical exam.  Lab tests, which may include: ? Taking a sample of fluid from the back of your nose and throat (nasopharyngeal fluid), your nose, or your throat using a swab. ? A sample of mucus from your lungs (sputum). ? Blood tests.  Imaging tests, which  may include, X-rays, CT scan, or ultrasound. How is this treated? At present, there is no medicine to treat COVID-19. Medicines that treat other diseases are being used on a trial basis to see if they are effective against COVID-19. Your health care provider will talk with you about ways to treat your symptoms. For most people, the infection is mild and can be managed at home with rest, fluids, and over-the-counter medicines. Treatment for a serious infection usually takes places in a hospital intensive care unit (ICU). It may include one or more of the following treatments. These treatments are given until your symptoms improve.  Receiving fluids and medicines through an IV.  Supplemental oxygen. Extra oxygen is given through a tube in the nose, a face mask, or a hood.  Positioning you to lie on your stomach (prone position). This makes it easier for oxygen to get into the lungs.  Continuous positive airway pressure (CPAP) or bi-level positive airway pressure (BPAP) machine. This treatment uses mild air pressure to keep the airways open. A tube that is connected to a motor delivers oxygen to the body.  Ventilator. This treatment moves air into and out of the lungs by using a tube that is placed in your windpipe.  Tracheostomy. This is a procedure to create a hole in the neck so that a breathing tube can be inserted.  Extracorporeal membrane oxygenation (ECMO). This procedure gives the lungs a chance to recover by taking over the functions of the heart and lungs. It supplies oxygen to the body and removes carbon dioxide. Follow these instructions at home: Lifestyle  If you are sick, stay home except to get medical care. Your health care provider will tell you how long to stay home. Call your health care provider before you go for medical care.  Rest at home as told by your health care provider.  Do not use any products that contain nicotine or tobacco, such as cigarettes, e-cigarettes, and  chewing tobacco. If you need help quitting, ask your health care provider.  Return to your normal activities as told by your health care provider. Ask your health care provider what activities are safe for you. General instructions  Take over-the-counter and prescription medicines only as told by your health care provider.  Drink enough fluid to keep your urine pale yellow.  Keep all follow-up visits as told by your health care provider. This is important. How is this prevented?  There is no vaccine to help prevent COVID-19 infection. However, there are steps you can take to protect yourself and others from this virus. To protect yourself:   Do not travel to areas where COVID-19 is a risk. The areas where COVID-19 is reported change often. To identify high-risk areas and travel restrictions, check the CDC travel website: wwwnc.cdc.gov/travel/notices  If you live in, or must travel to, an area where COVID-19 is a risk, take precautions to avoid infection. ? Stay away from people who are sick. ? Wash your hands often with soap and water for 20 seconds. If soap and water   are not available, use an alcohol-based hand sanitizer. ? Avoid touching your mouth, face, eyes, or nose. ? Avoid going out in public, follow guidance from your state and local health authorities. ? If you must go out in public, wear a cloth face covering or face mask. Make sure your mask covers your nose and mouth. ? Avoid crowded indoor spaces. Stay at least 6 feet (2 meters) away from others. ? Disinfect objects and surfaces that are frequently touched every day. This may include:  Counters and tables.  Doorknobs and light switches.  Sinks and faucets.  Electronics, such as phones, remote controls, keyboards, computers, and tablets. To protect others: If you have symptoms of COVID-19, take steps to prevent the virus from spreading to others.  If you think you have a COVID-19 infection, contact your health care  provider right away. Tell your health care team that you think you may have a COVID-19 infection.  Stay home. Leave your house only to seek medical care. Do not use public transport.  Do not travel while you are sick.  Wash your hands often with soap and water for 20 seconds. If soap and water are not available, use alcohol-based hand sanitizer.  Stay away from other members of your household. Let healthy household members care for children and pets, if possible. If you have to care for children or pets, wash your hands often and wear a mask. If possible, stay in your own room, separate from others. Use a different bathroom.  Make sure that all people in your household wash their hands well and often.  Cough or sneeze into a tissue or your sleeve or elbow. Do not cough or sneeze into your hand or into the air.  Wear a cloth face covering or face mask. Make sure your mask covers your nose and mouth. Where to find more information  Centers for Disease Control and Prevention: www.cdc.gov/coronavirus/2019-ncov/index.html  World Health Organization: www.who.int/health-topics/coronavirus Contact a health care provider if:  You live in or have traveled to an area where COVID-19 is a risk and you have symptoms of the infection.  You have had contact with someone who has COVID-19 and you have symptoms of the infection. Get help right away if:  You have trouble breathing.  You have pain or pressure in your chest.  You have confusion.  You have bluish lips and fingernails.  You have difficulty waking from sleep.  You have symptoms that get worse. These symptoms may represent a serious problem that is an emergency. Do not wait to see if the symptoms will go away. Get medical help right away. Call your local emergency services (911 in the [place] Do not drive yourself to the hospital. Let the emergency medical personnel know if you think you have COVID-19. Summary  COVID-19 is a  respiratory infection that is caused by a virus. It is also known as coronavirus disease or novel coronavirus. It can cause serious infections, such as pneumonia, acute respiratory distress syndrome, acute respiratory failure, or sepsis.  The virus that causes COVID-19 is contagious. This means that it can spread from person to person through droplets from breathing, speaking, singing, coughing, or sneezing.  You are more likely to develop a serious illness if you are 50 years of age or older, have a weak immune system, live in a nursing home, or have chronic disease.  There is no medicine to treat COVID-19. Your health care provider will talk with you about ways to treat your symptoms.    Take steps to protect yourself and others from infection. Wash your hands often and disinfect objects and surfaces that are frequently touched every day. Stay away from people who are sick and wear a mask if you are sick. This information is not intended to replace advice given to you by your health care provider. Make sure you discuss any questions you have with your health care provider. Document Revised: 09/25/2019 Document Reviewed: 01/01/2019 [employer] Patient Education  2020 [employer]  

## 2019-12-26 NOTE — Discharge Summary (Addendum)
DCPhysician Discharge Summary  Stanley Marks [id number] DOB: [birth year] DOA: 12/22/2019  PCP: Perrin Maltese, MD  Admit date: 12/22/2019 Discharge date: 12/26/2019  Admitted From: Home Disposition: Home  Recommendations for Outpatient Follow-up:  1. Follow up with PCP in 1-2 weeks 2. Please obtain BMP/CBC in one week  Home Health: Offered, patient refused  Discharge Condition: Stable CODE STATUS: Full Diet recommendation: As tolerated  Brief/Interim Summary: Stanley Marks is a 79 y.o. male with medical history significant of COPD, chronic systolic heart failure, chronic A. fib on Xarelto, hypertension, active smoker, informally using 4 L of oxygen at home from his daughter-in-law for his COPD presented as transfer from [facility] with COVID-19 pneumonia and hypoxemia.  Patient does have history of COPD, he still smokes, patient states that he has been using about 4 L through concentrator, his daughter-in-law's oxygen for last many months.  Last few days he has been having shortness of breath and cough.  Also had low-grade fever.  He came to emergency room with this symptoms, was on 4 L of oxygen, after seen in the emergency room he felt well and left AGAINST MEDICAL ADVICE.  He came back to emergency room in the middle of the night as he could not breathe at home. On arrival to emergency room he had low-grade temperature, blood pressure was stable.  Respiratory rate 22.  Oxygen was 94% on 6 L.  Covid positive.  WC count 18,000.  Chest x-ray showed some chronic changes. Patient was treated with remdesivir dexamethasone and Actemra at [facility].  Patient admitted as above with acute hypoxic respiratory failure in the setting of COVID-19 pneumonia.  Patient improved with standard treatment including remdesivir, steroids, supportive care and Actemra.  At this time patient has drastically resolved, otherwise stable for discharge home.  We have offered home health given PT  and OT recommendations but patient continues to refuse.  Of note patient also noted to have episodes of tachycardia which appear to be A. fib, he does carry a history of chronic paroxysmal A. fib this is now resolved with medication titration to diltiazem off of metoprolol.  Close follow-up with PCP, cardiology for further evaluation and treatment-continues on Xarelto.  Patient otherwise stable and agreeable for discharge as above.   Discharge Diagnoses:  Principal Problem:   Acute and chronic respiratory failure with hypoxia (HCC) Active Problems:   Chronic systolic CHF (congestive heart failure) (HCC)   Acute respiratory failure due to COVID-19 [facility])   Atrial fibrillation, chronic (HCC)   Essential hypertension   Pneumonia due to COVID-19 virus    Discharge Instructions  Discharge Instructions    Call MD for:  difficulty breathing, headache or visual disturbances   Complete by: As directed    Call MD for:  extreme fatigue   Complete by: As directed    Call MD for:  hives   Complete by: As directed    Call MD for:  persistant dizziness or light-headedness   Complete by: As directed    Call MD for:  persistant nausea and vomiting   Complete by: As directed    Call MD for:  severe uncontrolled pain   Complete by: As directed    Call MD for:  temperature >100.4   Complete by: As directed    Diet - low sodium heart healthy   Complete by: As directed    Increase activity slowly   Complete by: As directed      Allergies as of 12/26/2019  Reactions   Lipitor [atorvastatin Calcium] Other (See Comments)   arthralgia      Medication List    STOP taking these medications   metoprolol tartrate 5 MG/5ML Soln injection Commonly known as: LOPRESSOR   Toprol XL 100 MG 24 hr tablet Generic drug: metoprolol succinate     TAKE these medications   albuterol 108 (90 Base) MCG/ACT inhaler Commonly known as: VENTOLIN HFA Inhale 2 puffs into the lungs every 6 (six) hours as needed  for wheezing or shortness of breath.   ascorbic acid 500 MG tablet Commonly known as: VITAMIN C Take 1 tablet (500 mg total) by mouth daily.   dexamethasone 10 MG/ML injection Commonly known as: DECADRON Inject 0.6 mLs (6 mg total) into the vein daily.   digoxin 0.125 MG tablet Commonly known as: LANOXIN Take 0.125 mg by mouth daily.   diltiazem 60 MG 12 hr capsule Commonly known as: CARDIZEM SR Take 1 capsule (60 mg total) by mouth every 12 (twelve) hours.   Entresto 49-51 MG Generic drug: sacubitril-valsartan Take 1 tablet by mouth 2 (two) times daily.   guaiFENesin-dextromethorphan 100-10 MG/5ML syrup Commonly known as: ROBITUSSIN DM Take 10 mLs by mouth every 4 (four) hours as needed for cough.   multivitamin with minerals Tabs tablet Take 1 tablet by mouth daily.   predniSONE 10 MG tablet Commonly known as: DELTASONE Take 3 tablets (30 mg total) by mouth daily for 3 days, THEN 2 tablets (20 mg total) daily for 3 days, THEN 1 tablet (10 mg total) daily for 3 days. Start taking on: December 26, 2019   Rivaroxaban 15 MG Tabs tablet Commonly known as: XARELTO Take 15 mg by mouth daily with supper.   spironolactone 25 MG tablet Commonly known as: ALDACTONE Take 25 mg by mouth daily.   Symbicort 160-4.5 MCG/ACT inhaler Generic drug: budesonide-formoterol Inhale 1 puff into the lungs daily.   torsemide 20 MG tablet Commonly known as: DEMADEX Take 20 mg by mouth daily.   zinc sulfate 220 (50 Zn) MG capsule Take 1 capsule (220 mg total) by mouth daily.       Allergies  Allergen Reactions  . Lipitor [Atorvastatin Calcium] Other (See Comments)    arthralgia    Procedures/Studies: DG Chest 2 View  Result Date: 12/21/2019 CLINICAL DATA:  Shortness of breath EXAM: CHEST - 2 VIEW COMPARISON:  March 05, 2019 chest radiograph and chest CT FINDINGS: The interstitium is mildly thickened, stable. There is no appreciable edema or consolidation. Heart is upper normal in  size with pulmonary vascularity normal. No adenopathy. There is degenerative change in the thoracic spine. IMPRESSION: Mild interstitial thickening which may be indicative of a degree of chronic bronchitis. No edema or airspace opacity evident. Stable cardiac silhouette. No evident adenopathy. Electronically Signed   By: Lowella Grip III M.D.   On: 12/21/2019 14:10   DG Hip Unilat With Pelvis 2-3 Views Left  Result Date: 12/16/2019 CLINICAL DATA:  Pain. EXAM: DG HIP (WITH OR WITHOUT PELVIS) 2-3V LEFT COMPARISON:  None. FINDINGS: There is no acute displaced fracture or dislocation. 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 Vascular calcifications are noted. IMPRESSION: No acute displaced fracture or dislocation. Moderate bilateral hip osteoarthritis. Electronically Signed   By: Constance Holster M.D.   On: 12/16/2019 19:36    Subjective: No acute issues or events overnight, denies shortness of breath, chest pain, nausea, vomiting, diarrhea, constipation, headache, fevers, chills.   Discharge Exam: Vitals:   12/26/19 0355 12/26/19 0742  BP:  104/64 121/65  Pulse: 67 63  Resp: 17   Temp: 97.7 F (36.5 C) 97.8 F (36.6 C)  SpO2: 92% 94%   Vitals:   12/25/19 1700 12/25/19 2017 12/26/19 0355 12/26/19 0742  BP: 116/76 (!) 115/59 104/64 121/65  Pulse:  62 67 63  Resp:  16 17   Temp: 98.2 F (36.8 C) (!) 97.5 F (36.4 C) 97.7 F (36.5 C) 97.8 F (36.6 C)  TempSrc: Oral Oral Oral Oral  SpO2: 90% 92% 92% 94%  Weight:      Height:        General:  Pleasantly resting in bed, No acute distress. HEENT:  Normocephalic atraumatic.  Sclerae nonicteric, noninjected.  Extraocular movements intact bilaterally. Neck:  Without mass or deformity.  Trachea is midline. Lungs:  Clear to auscultate bilaterally without rhonchi, wheeze, or rales. Heart:  Regular rate and rhythm.  Without murmurs, rubs, or gallops. Abdomen:  Soft, nontender, nondistended.  Without guarding or  rebound. Extremities: Without cyanosis, clubbing, edema, or obvious deformity. Vascular:  Dorsalis pedis and posterior tibial pulses palpable bilaterally. Skin:  Warm and dry, no erythema, no ulcerations.   The results of significant diagnostics from this hospitalization (including imaging, microbiology, ancillary and laboratory) are listed below for reference.     Microbiology: Recent Results (from the past 240 hour(s))  Respiratory Panel by RT PCR (Flu A&B, Covid) - Nasopharyngeal Swab     Status: Abnormal   Collection Time: 12/21/19  5:17 PM   Specimen: Nasopharyngeal Swab  Result Value Ref Range Status   SARS Coronavirus 2 by RT PCR POSITIVE (A) NEGATIVE Final    Comment: RESULT CALLED TO, READ BACK BY AND VERIFIED WITH: MAIN,K AT [place] ON 12/21/2019 BY MOSLEY,J (NOTE) SARS-CoV-2 target nucleic acids are DETECTED. SARS-CoV-2 RNA is generally detectable in upper respiratory specimens  during the acute phase of infection. Positive results are indicative of the presence of the identified virus, but do not rule out bacterial infection or co-infection with other pathogens not detected by the test. Clinical correlation with patient history and other diagnostic information is necessary to determine patient infection status. The expected result is Negative. Fact Sheet for Patients:  PinkCheek.be Fact Sheet for Healthcare Providers: GravelBags.it This test is not yet approved or cleared by the [place] FDA and  has been authorized for detection and/or diagnosis of SARS-CoV-2 by FDA under an Emergency Use Authorization (EUA).  This EUA will remain in effect (meaning this test can be use d) for the duration of  the COVID-19 declaration under Section 564(b)(1) of the Act, 21 U.S.C. section 360bbb-3(b)(1), unless the authorization is terminated or revoked sooner.    Influenza A by PCR NEGATIVE NEGATIVE Final   Influenza B by  PCR NEGATIVE NEGATIVE Final    Comment: (NOTE) The Xpert Xpress SARS-CoV-2/FLU/RSV assay is intended as an aid in  the diagnosis of influenza from Nasopharyngeal swab specimens and  should not be used as a sole basis for treatment. Nasal washings and  aspirates are unacceptable for Xpert Xpress SARS-CoV-2/FLU/RSV  testing. Fact Sheet for Patients: PinkCheek.be Fact Sheet for Healthcare Providers: GravelBags.it This test is not yet approved or cleared by the [place] FDA and  has been authorized for detection and/or diagnosis of SARS-CoV-2 by  FDA under an Emergency Use Authorization (EUA). This EUA will remain  in effect (meaning this test can be used) for the duration of the  Covid-19 declaration under Section 564(b)(1) of the Act, 21  U.S.C. section 360bbb-3(b)(1), unless  the authorization is  terminated or revoked. Performed at [facility], [address] [place], [place] [address]   Blood Culture (routine x 2)     Status: None (Preliminary result)   Collection Time: 12/22/19  5:24 AM   Specimen: BLOOD  Result Value Ref Range Status   Specimen Description BLOOD LEFT FA  Final   Special Requests   Final    BOTTLES DRAWN AEROBIC AND ANAEROBIC Blood Culture results may not be optimal due to an inadequate volume of blood received in culture bottles   Culture   Final    NO GROWTH 4 DAYS Performed at [facility], [address] [place], [place] [address]    Report Status PENDING  Incomplete  Blood Culture (routine x 2)     Status: None (Preliminary result)   Collection Time: 12/22/19  6:37 AM   Specimen: BLOOD  Result Value Ref Range Status   Specimen Description BLOOD LEFT AC  Final   Special Requests   Final    BOTTLES DRAWN AEROBIC AND ANAEROBIC Blood Culture adequate volume   Culture   Final    NO GROWTH 4 DAYS Performed at [facility], [address] [place], [place]  [address]    Report Status PENDING  Incomplete     Labs: BNP (last 3 results) Recent Labs    12/22/19 0151  BNP [id number]*   Basic Metabolic Panel: Recent Labs  Lab 12/22/19 0151 12/23/19 0217 12/24/19 0304 12/25/19 0407 12/26/19 0320  NA 138 142 140 141 140  K 4.1 4.2 4.1 4.6 4.8  CL 101 101 101 103 100  CO2 29 32 29 26 29   GLUCOSE 183* 114* 144* 133* 141*  BUN 16 30* 45* 45* 46*  CREATININE 1.21 1.48* 1.19 1.35* 1.28*  CALCIUM 8.7* 8.7* 8.9 8.5* 9.0  MG  --  2.4 2.5* 2.4 2.5*  PHOS  --  3.5 2.7 3.1 3.1   Liver Function Tests: Recent Labs  Lab 12/22/19 0524 12/23/19 0217 12/24/19 0304 12/25/19 0407 12/26/19 0320  AST 19 15 29  69* 53*  ALT 18 21 34 92* 132*  ALKPHOS 48 54 66 67 82  BILITOT 2.5* 1.1 0.9 0.7 0.7  PROT 7.1 6.5 6.8 6.1* 6.4*  ALBUMIN 3.1* 2.9* 3.0* 2.7* 3.0*   No results for input(s): LIPASE, AMYLASE in the last 168 hours. No results for input(s): AMMONIA in the last 168 hours. CBC: Recent Labs  Lab 12/22/19 0151 12/23/19 0217 12/24/19 0304 12/25/19 0407 12/26/19 0320  WBC 18.9* 16.3* 16.1* 14.1* 15.0*  NEUTROABS  --  15.1* 14.8* 12.9* 13.3*  HGB 15.1 14.7 15.8 16.0 16.4  HCT 46.6 47.2 50.5 50.8 52.5*  MCV 93.8 97.5 96.4 95.8 96.7  PLT 204 205 272 259 288   Cardiac Enzymes: No results for input(s): CKTOTAL, CKMB, CKMBINDEX, TROPONINI in the last 168 hours. BNP: Invalid input(s): POCBNP CBG: No results for input(s): GLUCAP in the last 168 hours. D-Dimer Recent Labs    12/25/19 0407 12/26/19 0320  DDIMER 0.51* 0.51*   Hgb A1c No results for input(s): HGBA1C in the last 72 hours. Lipid Profile No results for input(s): CHOL, HDL, LDLCALC, TRIG, CHOLHDL, LDLDIRECT in the last 72 hours. Thyroid function studies No results for input(s): TSH, T4TOTAL, T3FREE, THYROIDAB in the last 72 hours.  Invalid input(s): FREET3 Anemia work up Recent Labs    12/25/19 0407 12/26/19 0320  FERRITIN 402* 296   Urinalysis No results found for:  COLORURINE, APPEARANCEUR, LABSPEC,  PHURINE, GLUCOSEU, HGBUR, BILIRUBINUR, KETONESUR, PROTEINUR, UROBILINOGEN, NITRITE, LEUKOCYTESUR Sepsis Labs Invalid input(s): PROCALCITONIN,  WBC,  LACTICIDVEN Microbiology Recent Results (from the past 240 hour(s))  Respiratory Panel by RT PCR (Flu A&B, Covid) - Nasopharyngeal Swab     Status: Abnormal   Collection Time: 12/21/19  5:17 PM   Specimen: Nasopharyngeal Swab  Result Value Ref Range Status   SARS Coronavirus 2 by RT PCR POSITIVE (A) NEGATIVE Final    Comment: RESULT CALLED TO, READ BACK BY AND VERIFIED WITH: MAIN,K AT [place] ON 12/21/2019 BY MOSLEY,J (NOTE) SARS-CoV-2 target nucleic acids are DETECTED. SARS-CoV-2 RNA is generally detectable in upper respiratory specimens  during the acute phase of infection. Positive results are indicative of the presence of the identified virus, but do not rule out bacterial infection or co-infection with other pathogens not detected by the test. Clinical correlation with patient history and other diagnostic information is necessary to determine patient infection status. The expected result is Negative. Fact Sheet for Patients:  PinkCheek.be Fact Sheet for Healthcare Providers: GravelBags.it This test is not yet approved or cleared by the [place] FDA and  has been authorized for detection and/or diagnosis of SARS-CoV-2 by FDA under an Emergency Use Authorization (EUA).  This EUA will remain in effect (meaning this test can be use d) for the duration of  the COVID-19 declaration under Section 564(b)(1) of the Act, 21 U.S.C. section 360bbb-3(b)(1), unless the authorization is terminated or revoked sooner.    Influenza A by PCR NEGATIVE NEGATIVE Final   Influenza B by PCR NEGATIVE NEGATIVE Final    Comment: (NOTE) The Xpert Xpress SARS-CoV-2/FLU/RSV assay is intended as an aid in  the diagnosis of influenza from Nasopharyngeal swab specimens  and  should not be used as a sole basis for treatment. Nasal washings and  aspirates are unacceptable for Xpert Xpress SARS-CoV-2/FLU/RSV  testing. Fact Sheet for Patients: PinkCheek.be Fact Sheet for Healthcare Providers: GravelBags.it This test is not yet approved or cleared by the [place] FDA and  has been authorized for detection and/or diagnosis of SARS-CoV-2 by  FDA under an Emergency Use Authorization (EUA). This EUA will remain  in effect (meaning this test can be used) for the duration of the  Covid-19 declaration under Section 564(b)(1) of the Act, 21  U.S.C. section 360bbb-3(b)(1), unless the authorization is  terminated or revoked. Performed at [facility], [address] [place], [place] [address]   Blood Culture (routine x 2)     Status: None (Preliminary result)   Collection Time: 12/22/19  5:24 AM   Specimen: BLOOD  Result Value Ref Range Status   Specimen Description BLOOD LEFT FA  Final   Special Requests   Final    BOTTLES DRAWN AEROBIC AND ANAEROBIC Blood Culture results may not be optimal due to an inadequate volume of blood received in culture bottles   Culture   Final    NO GROWTH 4 DAYS Performed at [facility], [address], [place], [place] [address]    Report Status PENDING  Incomplete  Blood Culture (routine x 2)     Status: None (Preliminary result)   Collection Time: 12/22/19  6:37 AM   Specimen: BLOOD  Result Value Ref Range Status   Specimen Description BLOOD LEFT AC  Final   Special Requests   Final    BOTTLES DRAWN AEROBIC AND ANAEROBIC Blood Culture adequate volume   Culture   Final    NO GROWTH 4 DAYS Performed at  [facility], [address], [place], [place] [address]    Report Status PENDING  Incomplete     Time coordinating discharge: Over 30 minutes  SIGNED:   Little Ishikawa, DO [facility] 12/26/2019, 8:52 AM Pager    If 7PM-7AM, please contact night-coverage [email or web address] Password [email or web address]

## 2019-12-27 LAB — CULTURE, BLOOD (ROUTINE X 2)
Culture: NO GROWTH
Culture: NO GROWTH
Special Requests: ADEQUATE

## 2020-01-08 DIAGNOSIS — I872 Venous insufficiency (chronic) (peripheral): Secondary | ICD-10-CM | POA: Insufficient documentation

## 2020-01-08 DIAGNOSIS — L97829 Non-pressure chronic ulcer of other part of left lower leg with unspecified severity: Secondary | ICD-10-CM

## 2020-01-08 HISTORY — DX: Non-pressure chronic ulcer of other part of left lower leg with unspecified severity: L97.829

## 2020-01-19 ENCOUNTER — Other Ambulatory Visit: Payer: Self-pay

## 2020-01-19 ENCOUNTER — Encounter: Payer: Medicare Other | Attending: Physician Assistant | Admitting: Physician Assistant

## 2020-01-19 DIAGNOSIS — F17200 Nicotine dependence, unspecified, uncomplicated: Secondary | ICD-10-CM | POA: Insufficient documentation

## 2020-01-19 DIAGNOSIS — R531 Weakness: Secondary | ICD-10-CM | POA: Diagnosis not present

## 2020-01-19 DIAGNOSIS — I482 Chronic atrial fibrillation, unspecified: Secondary | ICD-10-CM | POA: Diagnosis not present

## 2020-01-19 DIAGNOSIS — L97222 Non-pressure chronic ulcer of left calf with fat layer exposed: Secondary | ICD-10-CM | POA: Insufficient documentation

## 2020-01-19 DIAGNOSIS — I872 Venous insufficiency (chronic) (peripheral): Secondary | ICD-10-CM | POA: Insufficient documentation

## 2020-01-19 DIAGNOSIS — I890 Lymphedema, not elsewhere classified: Secondary | ICD-10-CM | POA: Insufficient documentation

## 2020-01-19 DIAGNOSIS — J449 Chronic obstructive pulmonary disease, unspecified: Secondary | ICD-10-CM | POA: Insufficient documentation

## 2020-01-19 DIAGNOSIS — L97822 Non-pressure chronic ulcer of other part of left lower leg with fat layer exposed: Secondary | ICD-10-CM | POA: Insufficient documentation

## 2020-01-19 DIAGNOSIS — I110 Hypertensive heart disease with heart failure: Secondary | ICD-10-CM | POA: Insufficient documentation

## 2020-01-19 DIAGNOSIS — I5042 Chronic combined systolic (congestive) and diastolic (congestive) heart failure: Secondary | ICD-10-CM | POA: Insufficient documentation

## 2020-01-20 NOTE — Progress Notes (Signed)
WRENLEY, WEATHERMAN ([id number]) Visit Report for 01/19/2020 Chief Complaint Document Details Patient Name: JUDGE, DECARO Date of Service: 01/19/2020 2:15 PM Medical Record Number: [id number] Patient Account Number: [id number] Date of Birth/Sex: Jun 14, 1941 (79 y.o. M) Treating RN: Montey Hora Primary Care Provider: Lamonte Sakai Other Clinician: Referring Provider: Referral, Self Treating Provider/Extender: Melburn Hake, Aashish Hamm Weeks in Treatment: 0 Information Obtained from: Patient Chief Complaint Left LE Ulcers Electronic Signature(s) Signed: 01/19/2020 2:53:56 PM By: Worthy Keeler PA-C Entered By: Worthy Keeler on 01/19/2020 14:53:55 [name] ([id number]) -------------------------------------------------------------------------------- HPI Details Patient Name: [name] Date of Service: 01/19/2020 2:15 PM Medical Record Number: [id number] Patient Account Number: [id number] Date of Birth/Sex: 01-29-1941 (79 y.o. M) Treating RN: Montey Hora Primary Care Provider: Lamonte Sakai Other Clinician: Referring Provider: Referral, Self Treating Provider/Extender: STONE III, Connelly Spruell Weeks in Treatment: 0 History of Present Illness HPI Description: Pleasant 79 year old with history of chronic venous insufficiency and hypertension. No History of diabetes or peripheral arterial disease. He says that he stopped taking his "fluid pill" so that he wouldn't have to urinate as often. He subsequently developed bilateral lower extremity swelling and left calf ulcerations in late August 2016. Status post bilateral lower extremity endovenous ablation over 5 years ago in [place]. He does not regularly wear compression stockings. Denies any history of DVT. He is ambulating per his baseline. No claudication or rest pain. Tolerated 3 layer compression bandage with Prisma with significant improvement. Recently declined compression bandages and has been using a Tubigrip for edema control. He returns to  clinic for follow-up and is without complaints. No significant pain. No fever or chills. No drainage. READMISSION 10/14/2019 This is a 79 year old man who is in this clinic for years ago with a venous insufficiency wound on the left leg. He was discharged with compression stockings but he does not wear them. He states he developed a blister on the right anterior tibial area in October. He was in the ER on 10/26 after falling and noted to have a wound on his right leg/shin although the patient states the blister was already present. He was felt possibly to be mildly dehydrated he declined IV fluid. X-ray did not show any osseous abnormalities however he was noted to have severe right knee osteoarthritis. The patient has known chronic venous insufficiency. He states he had ablations in his bilateral lower legs many years ago. This seems well verified from the history noted above. He is not a diabetic and does not have known PAD. Past medical history; no diabetes, atrial fibrillation on Xarelto, history of basal cell cancer, COPD continued smoker, hypertension, peripheral vascular disease. ABIs in our clinic were 1.36 on the right and 1.24 on the left 11/11; patient with a venous insufficiency wound on the right anterior lower leg. Admitted to clinic last week. He complains bitterly about the 3 layer compression we put on him stating it was uncomfortable he could not sleep. He has previously not tolerated stockings very well as well. His ABIs in this clinic were noncompressible however his dorsalis pedis and posterior tibial pulses are easily palpable. He has decent edema control. We used Hydrofera Blue which apparently stuck to the wound. There are good improvements in wound dimensions 11/18; the patient's venous insufficiency wound on the right anterior lower leg is almost all epithelialized. However he arrives in the clinic with 2 skin tears on his forearm from a fall at noon yesterday 11/04/2019  on evaluation today patient appears to be doing well with regard to his leg  ulcer as well as his right forearm ulcers. He has been tolerating the dressing changes without complication. Fortunately there is no sign of active infection at this time. No fevers, chills, nausea, vomiting, or diarrhea. I am extremely pleased with how things seem to be progressing based on what I am seeing today. 12/2; the patient's wound on the right anterior lower leg is healed. He has severe chronic venous insufficiency and has had previous ablations on this side. Previous ablations were done in [place]. He has not consistently worn compression stockings. He has a Tubigrip stocking on his left leg. The areas that he injured on his right dorsal forearm have some surface scabbing however I think this is on its way to closure I do not think independently they need to have him continue in this clinic. Readmission: YOUSAF, HANSARD ([id number]) 01/19/2020 upon evaluation today patient presents for reevaluation after having been seen last here in the clinic on November 11, 2019. He was readmitted last year on October 14, 2019 here in the clinic concerning right lower extremity ulcers. He had ABIs performed at that time that appeared to be doing well. Overall though he was only able to tolerate a Curlex and Coban wrap anything stronger was too much for him. He is still smoking unfortunately and this is an ongoing issue. He really has no plans to quit. He also does not wear compression socks he tells me that he cannot afford them. With that being said obviously I am not sure that this is really the reason behind why he does not like the compression socks my gut tells me he may have difficulty getting them on as well. Fortunately there is no signs of active infection at this time he does have a small ulcer on the anterior which actually began surface of his leg Christmas Eve 2020 he has larger wounds on the posterior surface of his  leg which are larger and actually began 2 weeks ago. He does have a history of congestive heart failure as noted by documentation. He also has venous insufficiency which is chronic as well as lymphedema. He also has hypertension. Electronic Signature(s) Signed: 01/19/2020 3:10:38 PM By: Worthy Keeler PA-C Entered By: Worthy Keeler on 01/19/2020 15:10:37 [name] ([id number]) -------------------------------------------------------------------------------- Physical Exam Details Patient Name: [name] Date of Service: 01/19/2020 2:15 PM Medical Record Number: [id number] Patient Account Number: [id number] Date of Birth/Sex: November 19, 1941 (79 y.o. M) Treating RN: Montey Hora Primary Care Provider: Lamonte Sakai Other Clinician: Referring Provider: Referral, Self Treating Provider/Extender: STONE III, Luverta Korte Weeks in Treatment: 0 Constitutional sitting or standing blood pressure is within target range for patient.. pulse regular and within target range for patient.Marland Kitchen respirations regular, non-labored and within target range for patient.Marland Kitchen temperature within target range for patient.. Obese and well-hydrated in no acute distress. Eyes conjunctiva clear no eyelid edema noted. pupils equal round and reactive to light and accommodation. Ears, Nose, Mouth, and Throat no gross abnormality of ear auricles or external auditory canals. normal hearing noted during conversation. mucus membranes moist. Respiratory normal breathing without difficulty. Cardiovascular 1+ dorsalis pedis/posterior tibialis pulses. 2+ pitting edema of the bilateral lower extremities. Gastrointestinal (GI) soft, non-tender, non-distended, +BS. no ventral hernia noted. Musculoskeletal normal gait and posture. no significant deformity or arthritic changes, no loss or range of motion, no clubbing. Psychiatric this patient is able to make decisions and demonstrates good insight into disease process. Alert and Oriented x  3. pleasant and cooperative. Notes Upon inspection today patient's wounds currently  appear to be doing okay there is not any significant depth of the wounds although unfortunately he continues to have a lot of drainage. There is no signs of active infection at this time he has had home health coming out although they just been putting dry gauze on the wound followed by some kind of wrap he tells me he is not pleased with this as he has been having issues with a lot of bleeding when you are pulled off it also is very painful. Patient's capillary refill appears to be less than 3 seconds and okay. Overall however he does not appear to be the healthiest gentleman. Electronic Signature(s) Signed: 01/19/2020 3:11:42 PM By: Worthy Keeler PA-C Entered By: Worthy Keeler on 01/19/2020 15:11:41 [name] ([id number]) -------------------------------------------------------------------------------- Physician Orders Details Patient Name: [name] Date of Service: 01/19/2020 2:15 PM Medical Record Number: [id number] Patient Account Number: [id number] Date of Birth/Sex: 1941-01-14 (79 y.o. M) Treating RN: Montey Hora Primary Care Provider: Lamonte Sakai Other Clinician: Referring Provider: Referral, Self Treating Provider/Extender: Melburn Hake, Jalin Alicea Weeks in Treatment: 0 Verbal / Phone Orders: No Diagnosis Coding ICD-10 Coding Code Description I87.2 Venous insufficiency (chronic) (peripheral) I89.0 Lymphedema, not elsewhere classified L97.822 Non-pressure chronic ulcer of other part of left lower leg with fat layer exposed I10 Essential (primary) hypertension I50.42 Chronic combined systolic (congestive) and diastolic (congestive) heart failure Wound Cleansing Wound #6 Left,Proximal,Posterior Lower Leg o Clean wound with Normal Saline. o Cleanse wound with mild soap and water Wound #7 Left,Distal,Posterior Lower Leg o Clean wound with Normal Saline. o Cleanse wound with mild  soap and water Wound #8 Left,Anterior Lower Leg o Clean wound with Normal Saline. o Cleanse wound with mild soap and water Skin Barriers/Peri-Wound Care Wound #6 Left,Proximal,Posterior Lower Leg o Moisturizing lotion Wound #7 Left,Distal,Posterior Lower Leg o Moisturizing lotion Wound #8 Left,Anterior Lower Leg o Moisturizing lotion Primary Wound Dressing Wound #6 Left,Proximal,Posterior Lower Leg o Silver Alginate Wound #7 Left,Distal,Posterior Lower Leg o Silver Alginate Wound #8 Left,Anterior Lower Leg o Silver Alginate Secondary Dressing COYE, DERAAD ([id number]) Wound #6 Left,Proximal,Posterior Lower Leg o ABD pad o XtraSorb - if needed for drainage Wound #7 Left,Distal,Posterior Lower Leg o ABD pad o XtraSorb - if needed for drainage Wound #8 Left,Anterior Lower Leg o ABD pad o XtraSorb - if needed for drainage Dressing Change Frequency o Change dressing every week Follow-up Appointments o Return Appointment in 1 week. Edema Control Wound #6 Left,Proximal,Posterior Lower Leg o Kerlix and Coban - Left Lower Extremity - anchor with unna Wound #7 Left,Distal,Posterior Lower Leg o Kerlix and Coban - Left Lower Extremity - anchor with unna Wound #8 Left,Anterior Lower Leg o Kerlix and Coban - Left Lower Extremity - anchor with unna Consults o Pain Clinic Electronic Signature(s) Signed: 01/19/2020 4:04:09 PM By: Worthy Keeler PA-C Signed: 01/19/2020 4:13:47 PM By: Montey Hora Entered By: Montey Hora on 01/19/2020 15:06:00 [name] ([id number]) -------------------------------------------------------------------------------- Problem List Details Patient Name: [name] Date of Service: 01/19/2020 2:15 PM Medical Record Number: [id number] Patient Account Number: [id number] Date of Birth/Sex: 1941/04/17 (79 y.o. M) Treating RN: Montey Hora Primary Care Provider: Lamonte Sakai Other Clinician: Referring Provider:  Referral, Self Treating Provider/Extender: Melburn Hake, Jasemine Nawaz Weeks in Treatment: 0 Active Problems ICD-10 Evaluated Encounter Code Description Active Date Today Diagnosis I87.2 Venous insufficiency (chronic) (peripheral) 01/19/2020 No Yes I89.0 Lymphedema, not elsewhere classified 01/19/2020 No Yes L97.822 Non-pressure chronic ulcer of other part of left lower leg with 01/19/2020 No Yes fat layer exposed I10  Essential (primary) hypertension 01/19/2020 No Yes I50.42 Chronic combined systolic (congestive) and diastolic 99991111 No Yes (congestive) heart failure Inactive Problems Resolved Problems Electronic Signature(s) Signed: 01/19/2020 2:53:38 PM By: Worthy Keeler PA-C Entered By: Worthy Keeler on 01/19/2020 14:53:37 [name] ([id number]) -------------------------------------------------------------------------------- Progress Note/History and Physical Details Patient Name: [name] Date of Service: 01/19/2020 2:15 PM Medical Record Number: [id number] Patient Account Number: [id number] Date of Birth/Sex: 1941/10/21 (79 y.o. M) Treating RN: Montey Hora Primary Care Provider: Lamonte Sakai Other Clinician: Referring Provider: Referral, Self Treating Provider/Extender: Melburn Hake, Shevaun Lovan Weeks in Treatment: 0 Subjective Chief Complaint Information obtained from Patient Left LE Ulcers History of Present Illness (HPI) Pleasant 79 year old with history of chronic venous insufficiency and hypertension. No History of diabetes or peripheral arterial disease. He says that he stopped taking his "fluid pill" so that he wouldn't have to urinate as often. He subsequently developed bilateral lower extremity swelling and left calf ulcerations in late August 2016. Status post bilateral lower extremity endovenous ablation over 5 years ago in [place]. He does not regularly wear compression stockings. Denies any history of DVT. He is ambulating per his baseline. No claudication or rest  pain. Tolerated 3 layer compression bandage with Prisma with significant improvement. Recently declined compression bandages and has been using a Tubigrip for edema control. He returns to clinic for follow-up and is without complaints. No significant pain. No fever or chills. No drainage. READMISSION 10/14/2019 This is a 79 year old man who is in this clinic for years ago with a venous insufficiency wound on the left leg. He was discharged with compression stockings but he does not wear them. He states he developed a blister on the right anterior tibial area in October. He was in the ER on 10/26 after falling and noted to have a wound on his right leg/shin although the patient states the blister was already present. He was felt possibly to be mildly dehydrated he declined IV fluid. X-ray did not show any osseous abnormalities however he was noted to have severe right knee osteoarthritis. The patient has known chronic venous insufficiency. He states he had ablations in his bilateral lower legs many years ago. This seems well verified from the history noted above. He is not a diabetic and does not have known PAD. Past medical history; no diabetes, atrial fibrillation on Xarelto, history of basal cell cancer, COPD continued smoker, hypertension, peripheral vascular disease. ABIs in our clinic were 1.36 on the right and 1.24 on the left 11/11; patient with a venous insufficiency wound on the right anterior lower leg. Admitted to clinic last week. He complains bitterly about the 3 layer compression we put on him stating it was uncomfortable he could not sleep. He has previously not tolerated stockings very well as well. His ABIs in this clinic were noncompressible however his dorsalis pedis and posterior tibial pulses are easily palpable. He has decent edema control. We used Hydrofera Blue which apparently stuck to the wound. There are good improvements in wound dimensions 11/18; the patient's venous  insufficiency wound on the right anterior lower leg is almost all epithelialized. However he arrives in the clinic with 2 skin tears on his forearm from a fall at noon yesterday 11/04/2019 on evaluation today patient appears to be doing well with regard to his leg ulcer as well as his right forearm ulcers. He has been tolerating the dressing changes without complication. Fortunately there is no sign of active infection at this time. No fevers, chills, nausea, vomiting, or  diarrhea. I am extremely pleased with how things seem to be progressing based on what I am seeing today. 12/2; the patient's wound on the right anterior lower leg is healed. He has severe chronic venous insufficiency and has had previous ablations on this side. Previous ablations were done in [place]. He has not consistently worn compression ALANI, CARREJO ([id number]) stockings. He has a Tubigrip stocking on his left leg. The areas that he injured on his right dorsal forearm have some surface scabbing however I think this is on its way to closure I do not think independently they need to have him continue in this clinic. Readmission: 01/19/2020 upon evaluation today patient presents for reevaluation after having been seen last here in the clinic on November 11, 2019. He was readmitted last year on October 14, 2019 here in the clinic concerning right lower extremity ulcers. He had ABIs performed at that time that appeared to be doing well. Overall though he was only able to tolerate a Curlex and Coban wrap anything stronger was too much for him. He is still smoking unfortunately and this is an ongoing issue. He really has no plans to quit. He also does not wear compression socks he tells me that he cannot afford them. With that being said obviously I am not sure that this is really the reason behind why he does not like the compression socks my gut tells me he may have difficulty getting them on as well. Fortunately there is no  signs of active infection at this time he does have a small ulcer on the anterior which actually began surface of his leg Christmas Eve 2020 he has larger wounds on the posterior surface of his leg which are larger and actually began 2 weeks ago. He does have a history of congestive heart failure as noted by documentation. He also has venous insufficiency which is chronic as well as lymphedema. He also has hypertension. Patient History Information obtained from Patient. Allergies No Known Drug Allergies Family History Heart Disease - Mother,Siblings, Hypertension - Mother,Siblings, No family history of Cancer, Diabetes, Kidney Disease, Lung Disease, Seizures, Stroke, Thyroid Problems. Social History Current every day smoker, Marital Status - Married, Alcohol Use - Never, Drug Use - No History, Caffeine Use - Daily. Medical History Eyes Patient has history of Cataracts Denies history of Glaucoma, Optic Neuritis Ear/Nose/Mouth/Throat Denies history of Chronic sinus problems/congestion, Middle ear problems Hematologic/Lymphatic Denies history of Anemia, Hemophilia, Human Immunodeficiency Virus, Lymphedema, Sickle Cell Disease Respiratory Patient has history of Chronic Obstructive Pulmonary Disease (COPD) Denies history of Aspiration, Asthma, Pneumothorax, Sleep Apnea, Tuberculosis Cardiovascular Patient has history of Arrhythmia - Bradycardia, Congestive Heart Failure, Hypertension Denies history of Angina, Coronary Artery Disease, Deep Vein Thrombosis, Hypotension, Myocardial Infarction, Peripheral Arterial Disease, Peripheral Venous Disease, Phlebitis, Vasculitis Gastrointestinal Denies history of Cirrhosis , Colitis, Crohn s, Hepatitis A, Hepatitis B, Hepatitis C Endocrine Denies history of Type I Diabetes, Type II Diabetes Genitourinary Denies history of End Stage Renal Disease Immunological Denies history of Lupus Erythematosus, Raynaud s, Scleroderma Integumentary  (Skin) Denies history of History of Burn, History of pressure wounds Musculoskeletal Patient has history of Osteomyelitis - right shin XANE, MARUSKA ([id number]) Denies history of Gout, Rheumatoid Arthritis, Osteoarthritis Neurologic Denies history of Dementia, Neuropathy, Quadriplegia, Paraplegia, Seizure Disorder Oncologic Denies history of Received Chemotherapy, Received Radiation Psychiatric Denies history of Anorexia/bulimia, Confinement Anxiety Hospitalization/Surgery History - January 2021 COVID. Medical And Surgical History Notes Constitutional Symptoms (General Health) Fluid swelling; HTN medicarion in past; appendicitis; hepatitis Oncologic  Skin Cancer Review of Systems (ROS) Constitutional Symptoms (General Health) Denies complaints or symptoms of Fatigue, Fever, Chills, Marked Weight Change. Eyes Denies complaints or symptoms of Dry Eyes, Vision Changes, Glasses / Contacts. Ear/Nose/Mouth/Throat Denies complaints or symptoms of Difficult clearing ears, Sinusitis. Hematologic/Lymphatic Denies complaints or symptoms of Bleeding / Clotting Disorders, Human Immunodeficiency Virus. Respiratory Denies complaints or symptoms of Chronic or frequent coughs, Shortness of Breath. Cardiovascular Denies complaints or symptoms of Chest pain, LE edema. Gastrointestinal Denies complaints or symptoms of Frequent diarrhea, Nausea, Vomiting. Endocrine Denies complaints or symptoms of Hepatitis, Thyroid disease, Polydypsia (Excessive Thirst). Genitourinary Denies complaints or symptoms of Kidney failure/ Dialysis, Incontinence/dribbling. Immunological Denies complaints or symptoms of Hives, Itching. Integumentary (Skin) Complains or has symptoms of Wounds. Denies complaints or symptoms of Bleeding or bruising tendency, Breakdown, Swelling. Musculoskeletal Denies complaints or symptoms of Muscle Pain, Muscle Weakness. Neurologic Denies complaints or symptoms of  Numbness/parasthesias, Focal/Weakness. Psychiatric Denies complaints or symptoms of Anxiety, Claustrophobia. Objective Constitutional sitting or standing blood pressure is within target range for patient.. pulse regular and within target range for patient.Valetta Close, Rolan Lipa ([id number]) respirations regular, non-labored and within target range for patient.Marland Kitchen temperature within target range for patient.. Obese and well-hydrated in no acute distress. Vitals Time Taken: 2:21 PM, Height: 74 in, Source: Stated, Weight: 290 lbs, Source: Stated, BMI: 37.2, Temperature: 98.0 F, Pulse: 81 bpm, Respiratory Rate: 16 breaths/min, Blood Pressure: 100/58 mmHg. Eyes conjunctiva clear no eyelid edema noted. pupils equal round and reactive to light and accommodation. Ears, Nose, Mouth, and Throat no gross abnormality of ear auricles or external auditory canals. normal hearing noted during conversation. mucus membranes moist. Respiratory normal breathing without difficulty. Cardiovascular 1+ dorsalis pedis/posterior tibialis pulses. 2+ pitting edema of the bilateral lower extremities. Gastrointestinal (GI) soft, non-tender, non-distended, +BS. no ventral hernia noted. Musculoskeletal normal gait and posture. no significant deformity or arthritic changes, no loss or range of motion, no clubbing. Psychiatric this patient is able to make decisions and demonstrates good insight into disease process. Alert and Oriented x 3. pleasant and cooperative. General Notes: Upon inspection today patient's wounds currently appear to be doing okay there is not any significant depth of the wounds although unfortunately he continues to have a lot of drainage. There is no signs of active infection at this time he has had home health coming out although they just been putting dry gauze on the wound followed by some kind of wrap he tells me he is not pleased with this as he has been having issues with a lot of bleeding when you  are pulled off it also is very painful. Patient's capillary refill appears to be less than 3 seconds and okay. Overall however he does not appear to be the healthiest gentleman. Integumentary (Hair, Skin) Wound #6 status is Open. Original cause of wound was Blister. The wound is located on the Left,Proximal,Posterior Lower Leg. The wound measures 7.7cm length x 7.5cm width x 0.1cm depth; 45.357cm^2 area and 4.536cm^3 volume. There is Fat Layer (Subcutaneous Tissue) Exposed exposed. There is no tunneling or undermining noted. There is a large amount of serosanguineous drainage noted. There is large (67-100%) red granulation within the wound bed. There is a small (1-33%) amount of necrotic tissue within the wound bed including Adherent Slough. Wound #7 status is Open. Original cause of wound was Blister. The wound is located on the Left,Distal,Posterior Lower Leg. The wound measures 4.6cm length x 4.2cm width x 0.1cm depth; 15.174cm^2 area and 1.517cm^3 volume. There is Fat  Layer (Subcutaneous Tissue) Exposed exposed. There is no tunneling or undermining noted. There is a medium amount of serosanguineous drainage noted. There is large (67-100%) red granulation within the wound bed. There is a small (1-33%) amount of necrotic tissue within the wound bed including Adherent Slough. Wound #8 status is Open. Original cause of wound was Blister. The wound is located on the Left,Anterior Lower Leg. The wound measures 1.5cm length x 1cm width x 0.1cm depth; 1.178cm^2 area and 0.118cm^3 volume. There is Fat Layer (Subcutaneous Tissue) Exposed exposed. There is no tunneling or undermining noted. There is a medium amount of serosanguineous drainage noted. There is small (1-33%) red granulation within the wound bed. There is a large (67-100%) amount of necrotic tissue within the wound bed including Eschar. GLENROY, BABAUTA ([id number]) Assessment Active Problems ICD-10 Venous insufficiency (chronic)  (peripheral) Lymphedema, not elsewhere classified Non-pressure chronic ulcer of other part of left lower leg with fat layer exposed Essential (primary) hypertension Chronic combined systolic (congestive) and diastolic (congestive) heart failure Plan Wound Cleansing: Wound #6 Left,Proximal,Posterior Lower Leg: Clean wound with Normal Saline. Cleanse wound with mild soap and water Wound #7 Left,Distal,Posterior Lower Leg: Clean wound with Normal Saline. Cleanse wound with mild soap and water Wound #8 Left,Anterior Lower Leg: Clean wound with Normal Saline. Cleanse wound with mild soap and water Skin Barriers/Peri-Wound Care: Wound #6 Left,Proximal,Posterior Lower Leg: Moisturizing lotion Wound #7 Left,Distal,Posterior Lower Leg: Moisturizing lotion Wound #8 Left,Anterior Lower Leg: Moisturizing lotion Primary Wound Dressing: Wound #6 Left,Proximal,Posterior Lower Leg: Silver Alginate Wound #7 Left,Distal,Posterior Lower Leg: Silver Alginate Wound #8 Left,Anterior Lower Leg: Silver Alginate Secondary Dressing: Wound #6 Left,Proximal,Posterior Lower Leg: ABD pad XtraSorb - if needed for drainage Wound #7 Left,Distal,Posterior Lower Leg: ABD pad XtraSorb - if needed for drainage Wound #8 Left,Anterior Lower Leg: ABD pad XtraSorb - if needed for drainage Dressing Change Frequency: Change dressing every week Follow-up Appointments: Return Appointment in 1 week. Edema Control: Wound #6 Left,Proximal,Posterior Lower Leg: NIKIL, MAYON ([id number]) Kerlix and Coban - Left Lower Extremity - anchor with unna Wound #7 Left,Distal,Posterior Lower Leg: Kerlix and Coban - Left Lower Extremity - anchor with unna Wound #8 Left,Anterior Lower Leg: Kerlix and Coban - Left Lower Extremity - anchor with unna Consults ordered were: Pain Clinic 1. I am going to recommend currently that we go ahead and initiate treatment with a silver alginate dressing specifically silver cell which I  think would do well not sticking to his wounds at this point. 2. I am also going to suggest a Curlex and Coban wrap to left lower extremity. The issue here is that anything stronger seem to cause some discomfort previously when we have attempted this he cannot tolerate anything too tight. 3. I am also going to recommend a referral to the pain management clinic he inquired about pain medicine here in the clinic and obviously we do not prescribe anything as such. I therefore would like to have him seen there at the pain clinic to see if there is anything they could help him out with while to get these wounds to heal. We will see patient back for reevaluation in 1 week here in the clinic. If anything worsens or changes patient will contact our office for additional recommendations. Electronic Signature(s) Signed: 01/19/2020 3:12:54 PM By: Worthy Keeler PA-C Entered By: Worthy Keeler on 01/19/2020 15:12:54 [name] ([id number]) -------------------------------------------------------------------------------- ROS/PFSH Details Patient Name: [name] Date of Service: 01/19/2020 2:15 PM Medical Record Number: [id number] Patient Account Number: [id number]  Date of Birth/Sex: 11/22/1941 (79 y.o. M) Treating RN: Army Melia Primary Care Provider: Lamonte Sakai Other Clinician: Referring Provider: Referral, Self Treating Provider/Extender: STONE III, Taquan Bralley Weeks in Treatment: 0 Label Progress Note Print Version as History and Physical for this encounter Information Obtained From Patient Constitutional Symptoms (General Health) Complaints and Symptoms: Negative for: Fatigue; Fever; Chills; Marked Weight Change Medical History: Past Medical History Notes: Fluid swelling; HTN medicarion in past; appendicitis; hepatitis Eyes Complaints and Symptoms: Negative for: Dry Eyes; Vision Changes; Glasses / Contacts Medical History: Positive for: Cataracts Negative for: Glaucoma; Optic  Neuritis Ear/Nose/Mouth/Throat Complaints and Symptoms: Negative for: Difficult clearing ears; Sinusitis Medical History: Negative for: Chronic sinus problems/congestion; Middle ear problems Hematologic/Lymphatic Complaints and Symptoms: Negative for: Bleeding / Clotting Disorders; Human Immunodeficiency Virus Medical History: Negative for: Anemia; Hemophilia; Human Immunodeficiency Virus; Lymphedema; Sickle Cell Disease Respiratory Complaints and Symptoms: Negative for: Chronic or frequent coughs; Shortness of Breath Medical History: Positive for: Chronic Obstructive Pulmonary Disease (COPD) Negative for: Aspiration; Asthma; Pneumothorax; Sleep Apnea; Tuberculosis Cardiovascular Complaints and Symptoms: Negative for: Chest pain; LE edema ILO, TARTAGLIONE ([id number]) Medical History: Positive for: Arrhythmia - Bradycardia; Congestive Heart Failure; Hypertension Negative for: Angina; Coronary Artery Disease; Deep Vein Thrombosis; Hypotension; Myocardial Infarction; Peripheral Arterial Disease; Peripheral Venous Disease; Phlebitis; Vasculitis Gastrointestinal Complaints and Symptoms: Negative for: Frequent diarrhea; Nausea; Vomiting Medical History: Negative for: Cirrhosis ; Colitis; Crohnos; Hepatitis A; Hepatitis B; Hepatitis C Endocrine Complaints and Symptoms: Negative for: Hepatitis; Thyroid disease; Polydypsia (Excessive Thirst) Medical History: Negative for: Type I Diabetes; Type II Diabetes Genitourinary Complaints and Symptoms: Negative for: Kidney failure/ Dialysis; Incontinence/dribbling Medical History: Negative for: End Stage Renal Disease Immunological Complaints and Symptoms: Negative for: Hives; Itching Medical History: Negative for: Lupus Erythematosus; Raynaudos; Scleroderma Integumentary (Skin) Complaints and Symptoms: Positive for: Wounds Negative for: Bleeding or bruising tendency; Breakdown; Swelling Medical History: Negative for: History of Burn;  History of pressure wounds Musculoskeletal Complaints and Symptoms: Negative for: Muscle Pain; Muscle Weakness Medical History: Positive for: Osteomyelitis - right shin Negative for: Gout; Rheumatoid Arthritis; Osteoarthritis Neurologic Complaints and Symptoms: Negative for: Numbness/parasthesias; CISCO, MCGUANE ([id number]) Medical History: Negative for: Dementia; Neuropathy; Quadriplegia; Paraplegia; Seizure Disorder Psychiatric Complaints and Symptoms: Negative for: Anxiety; Claustrophobia Medical History: Negative for: Anorexia/bulimia; Confinement Anxiety Oncologic Medical History: Negative for: Received Chemotherapy; Received Radiation Past Medical History Notes: Skin Cancer HBO Extended History Items Eyes: Cataracts Immunizations Pneumococcal Vaccine: Received Pneumococcal Vaccination: Yes Implantable Devices None Hospitalization / Surgery History Type of Hospitalization/Surgery January 2021 [facility] Family and Social History Cancer: No; Diabetes: No; Heart Disease: Yes - Mother,Siblings; Hypertension: Yes - Mother,Siblings; Kidney Disease: No; Lung Disease: No; Seizures: No; Stroke: No; Thyroid Problems: No; Current every day smoker; Marital Status - Married; Alcohol Use: Never; Drug Use: No History; Caffeine Use: Daily; Financial Concerns: No; Food, Clothing or Shelter Needs: No; Support System Lacking: No; Transportation Concerns: No Electronic Signature(s) Signed: 01/19/2020 4:04:09 PM By: Worthy Keeler PA-C Signed: 01/20/2020 11:18:27 AM By: Army Melia Entered By: Army Melia on 01/19/2020 14:33:19 [name] ([id number]) -------------------------------------------------------------------------------- SuperBill Details Patient Name: [name] Date of Service: 01/19/2020 Medical Record Number: [id number] Patient Account Number: [id number] Date of Birth/Sex: Feb 20, 1941 (79 y.o. M) Treating RN: Montey Hora Primary Care Provider: Lamonte Sakai Other Clinician: Referring Provider: Referral, Self Treating Provider/Extender: Melburn Hake, Jacksen Isip Weeks in Treatment: 0 Diagnosis Coding ICD-10 Codes Code Description I87.2 Venous insufficiency (chronic) (peripheral) I89.0 Lymphedema, not elsewhere classified L97.822 Non-pressure chronic ulcer of other part of left lower leg with  fat layer exposed I10 Essential (primary) hypertension I50.42 Chronic combined systolic (congestive) and diastolic (congestive) heart failure Facility Procedures CPT4 Code: [id number] Description: [id number] - WOUND CARE VISIT-LEV 5 EST PT Modifier: Quantity: 1 Physician Procedures CPT4 Code Description: [id number] - WC PHYS LEVEL 4 - EST PT ICD-10 Diagnosis Description I87.2 Venous insufficiency (chronic) (peripheral) I89.0 Lymphedema, not elsewhere classified L97.822 Non-pressure chronic ulcer of other part of left lower leg  wit I10 Essential (primary) hypertension Modifier: h fat layer expos Quantity: 1 ed Electronic Signature(s) Signed: 01/19/2020 3:13:10 PM By: Worthy Keeler PA-C Entered By: Worthy Keeler on 01/19/2020 15:13:09

## 2020-01-20 NOTE — Progress Notes (Signed)
Stanley Marks ([id number]) Visit Report for 01/19/2020 Abuse/Suicide Risk Screen Details Patient Name: Stanley Marks Date of Service: 01/19/2020 2:15 PM Medical Record Number: [id number] Patient Account Number: [id number] Date of Birth/Sex: December 17, 1940 (79 y.o. M) Treating RN: Army Melia Primary Care Kaylor Maiers: Lamonte Sakai Other Clinician: Referring Burke Terry: Referral, Self Treating Roma Bondar/Extender: STONE III, HOYT Weeks in Treatment: 0 Abuse/Suicide Risk Screen Items Answer ABUSE RISK SCREEN: Has anyone close to you tried to hurt or harm you recentlyo No Do you feel uncomfortable with anyone in your familyo No Has anyone forced you do things that you didnot want to doo No Electronic Signature(s) Signed: 01/20/2020 11:18:27 AM By: Army Melia Entered By: Army Melia on 01/19/2020 14:33:27 Stanley Marks ([id number]) -------------------------------------------------------------------------------- Activities of Daily Living Details Patient Name: Stanley Marks Date of Service: 01/19/2020 2:15 PM Medical Record Number: [id number] Patient Account Number: [id number] Date of Birth/Sex: October 02, 1941 (78 y.o. M) Treating RN: Army Melia Primary Care Ares Cardozo: Lamonte Sakai Other Clinician: Referring Charlee Whitebread: Referral, Self Treating Heather Mckendree/Extender: STONE III, HOYT Weeks in Treatment: 0 Activities of Daily Living Items Answer Activities of Daily Living (Please select one for each item) Drive Automobile Completely Able Take Medications Completely Able Use Telephone Completely Able Care for Appearance Completely Able Use Toilet Completely Able Bath / Shower Completely Able Dress Self Completely Able Feed Self Completely Able Walk Completely Able Get In / Out Bed Completely Able Housework Completely Able Prepare Meals Completely [place] for Self Completely Able Electronic Signature(s) Signed: 01/20/2020 11:18:27 AM By: Army Melia Entered By: Army Melia on 01/19/2020 14:33:35 Stanley Marks ([id number]) -------------------------------------------------------------------------------- Education Screening Details Patient Name: Stanley Marks Date of Service: 01/19/2020 2:15 PM Medical Record Number: [id number] Patient Account Number: [id number] Date of Birth/Sex: 1941/03/11 (78 y.o. M) Treating RN: Army Melia Primary Care Jermy Couper: Lamonte Sakai Other Clinician: Referring Alexine Pilant: Referral, Self Treating Daegen Berrocal/Extender: Melburn Hake, HOYT Weeks in Treatment: 0 Primary Learner Assessed: Patient Learning Preferences/Education Level/Primary Language Learning Preference: Explanation Highest Education Level: High School Preferred Language: English Cognitive Barrier Language Barrier: No Translator Needed: No Memory Deficit: No Emotional Barrier: No Cultural/Religious Beliefs Affecting Medical Care: No Physical Barrier Impaired Vision: No Impaired Hearing: No Decreased Hand dexterity: No Knowledge/Comprehension Knowledge Level: High Comprehension Level: High Ability to understand written High instructions: Ability to understand verbal High instructions: Motivation Anxiety Level: Calm Cooperation: Cooperative Education Importance: Acknowledges Need Interest in Health Problems: Asks Questions Perception: Coherent Willingness to Engage in Self- High Management Activities: Readiness to Engage in Self- High Management Activities: Electronic Signature(s) Signed: 01/20/2020 11:18:27 AM By: Army Melia Entered By: Army Melia on 01/19/2020 14:33:50 Stanley Marks ([id number]) -------------------------------------------------------------------------------- Fall Risk Assessment Details Patient Name: Stanley Marks Date of Service: 01/19/2020 2:15 PM Medical Record Number: [id number] Patient Account Number: [id number] Date of Birth/Sex: [birth year] (79 y.o. M) Treating RN: Army Melia Primary Care Mahasin Riviere: Lamonte Sakai Other  Clinician: Referring Rocquel Askren: Referral, Self Treating Keyri Salberg/Extender: Melburn Hake, HOYT Weeks in Treatment: 0 Fall Risk Assessment Items Have you had 2 or more falls in the last 12 monthso 0 Yes Have you had any fall that resulted in injury in the last 12 monthso 0 Yes FALLS RISK SCREEN History of falling - immediate or within 3 months 25 Yes Secondary diagnosis (Do you have 2 or more medical diagnoseso) 0 No Ambulatory aid None/bed rest/wheelchair/nurse 0 No Crutches/cane/walker 15 Yes Furniture 0 No Intravenous therapy Access/Saline/Heparin Lock 0 No Gait/Transferring Normal/ bed rest/ wheelchair 0 No Weak (short steps with or without shuffle, stooped but able  to lift head while 10 Yes walking, may seek support from furniture) Impaired (short steps with shuffle, may have difficulty arising from chair, head 0 No down, impaired balance) Mental Status Oriented to own ability 0 No Electronic Signature(s) Signed: 01/20/2020 11:18:27 AM By: Army Melia Entered By: Army Melia on 01/19/2020 14:34:10 Stanley Marks ([id number]) -------------------------------------------------------------------------------- Foot Assessment Details Patient Name: Stanley Marks Date of Service: 01/19/2020 2:15 PM Medical Record Number: [id number] Patient Account Number: [id number] Date of Birth/Sex: [birth year] (78 y.o. M) Treating RN: Army Melia Primary Care Ajeet Casasola: Lamonte Sakai Other Clinician: Referring Nakaiya Beddow: Referral, Self Treating Emrik Erhard/Extender: STONE III, HOYT Weeks in Treatment: 0 Foot Assessment Items Site Locations + = Sensation present, - = Sensation absent, C = Callus, U = Ulcer R = Redness, W = Warmth, M = Maceration, PU = Pre-ulcerative lesion F = Fissure, S = Swelling, D = Dryness Assessment Right: Left: Other Deformity: No No Prior Foot Ulcer: No No Prior Amputation: No No Charcot Joint: No No Ambulatory Status: Ambulatory With Help Assistance Device: Walker Gait:  Administrator, arts) Signed: 01/20/2020 11:18:27 AM By: Army Melia Entered By: Army Melia on 01/19/2020 14:34:38 Stanley Marks ([id number]) -------------------------------------------------------------------------------- Nutrition Risk Screening Details Patient Name: Stanley Marks Date of Service: 01/19/2020 2:15 PM Medical Record Number: [id number] Patient Account Number: [id number] Date of Birth/Sex: [birth year] (78 y.o. M) Treating RN: Army Melia Primary Care Rashay Barnette: Lamonte Sakai Other Clinician: Referring Ger Nicks: Referral, Self Treating Samantha Ragen/Extender: STONE III, HOYT Weeks in Treatment: 0 Height (in): 74 Weight (lbs): 290 Body Mass Index (BMI): 37.2 Nutrition Risk Screening Items Score Screening NUTRITION RISK SCREEN: I have an illness or condition that made me change the kind and/or amount of 0 No food I eat I eat fewer than two meals per day 0 No I eat few fruits and vegetables, or milk products 0 No I have three or more drinks of beer, liquor or wine almost every day 0 No I have tooth or mouth problems that make it hard for me to eat 0 No I don't always have enough money to buy the food I need 0 No I eat alone most of the time 0 No I take three or more different prescribed or over-the-counter drugs a day 0 No Without wanting to, I have lost or gained 10 pounds in the last six months 0 No I am not always physically able to shop, cook and/or feed myself 0 No Nutrition Protocols Good Risk Protocol 0 No interventions needed Moderate Risk Protocol High Risk Proctocol Risk Level: Good Risk Score: 0 Electronic Signature(s) Signed: 01/20/2020 11:18:27 AM By: Army Melia Entered By: Army Melia on 01/19/2020 14:34:15

## 2020-01-20 NOTE — Progress Notes (Signed)
COLESTON, SARFF ([id number]) Visit Report for 01/19/2020 Allergy List Details Patient Name: Stanley Marks, Stanley Marks Date of Service: 01/19/2020 2:15 PM Medical Record Number: [id number] Patient Account Number: [id number] Date of Birth/Sex: Apr 20, 1941 (79 y.o. M) Treating RN: Army Melia Primary Care Tikita Mabee: Lamonte Sakai Other Clinician: Referring Osmin Welz: Referral, Self Treating Apurva Reily/Extender: STONE III, HOYT Weeks in Treatment: 0 Allergies Active Allergies No Known Drug Allergies Allergy Notes Electronic Signature(s) Signed: 01/20/2020 11:18:27 AM By: Army Melia Entered By: Army Melia on 01/19/2020 14:31:14 Stanley Marks ([id number]) -------------------------------------------------------------------------------- Arrival Information Details Patient Name: Stanley Marks Date of Service: 01/19/2020 2:15 PM Medical Record Number: [id number] Patient Account Number: [id number] Date of Birth/Sex: 1941/06/27 (79 y.o. M) Treating RN: Army Melia Primary Care Coulter Oldaker: Lamonte Sakai Other Clinician: Referring Trayven Lumadue: Referral, Self Treating Bernese Doffing/Extender: Melburn Hake, HOYT Weeks in Treatment: 0 Visit Information Patient Arrived: Walker Arrival Time: 14:21 Accompanied By: family Transfer Assistance: None Patient Identification Verified: Yes History Since Last Visit Added or deleted any medications: No Any new allergies or adverse reactions: No Had a fall or experienced change in activities of daily living that may affect risk of falls: No Signs or symptoms of abuse/neglect since last visito No Hospitalized since last visit: No Has Dressing in Place as Prescribed: Yes Electronic Signature(s) Signed: 01/20/2020 11:18:27 AM By: Army Melia Entered By: Army Melia on 01/19/2020 14:21:28 Stanley Marks ([id number]) -------------------------------------------------------------------------------- Clinic Level of Care Assessment Details Patient Name: Stanley Marks Date of Service:  01/19/2020 2:15 PM Medical Record Number: [id number] Patient Account Number: [id number] Date of Birth/Sex: 01/14/1941 (79 y.o. M) Treating RN: Montey Hora Primary Care Konica Stankowski: Lamonte Sakai Other Clinician: Referring Dyke Weible: Referral, Self Treating Kellina Dreese/Extender: Melburn Hake, HOYT Weeks in Treatment: 0 Clinic Level of Care Assessment Items TOOL 2 Quantity Score []  - Use when only an EandM is performed on the INITIAL visit 0 ASSESSMENTS - Nursing Assessment / Reassessment X - General Physical Exam (combine w/ comprehensive assessment (listed just below) when 1 20 performed on new pt. evals) X- 1 25 Comprehensive Assessment (HX, ROS, Risk Assessments, Wounds Hx, etc.) ASSESSMENTS - Wound and Skin Assessment / Reassessment []  - Simple Wound Assessment / Reassessment - one wound 0 X- 3 5 Complex Wound Assessment / Reassessment - multiple wounds []  - 0 Dermatologic / Skin Assessment (not related to wound area) ASSESSMENTS - Ostomy and/or Continence Assessment and Care []  - Incontinence Assessment and Management 0 []  - 0 Ostomy Care Assessment and Management (repouching, etc.) PROCESS - Coordination of Care X - Simple Patient / Family Education for ongoing care 1 15 []  - 0 Complex (extensive) Patient / Family Education for ongoing care X- 1 10 Staff obtains Programmer, systems, Records, Test Results / Process Orders []  - 0 Staff telephones HHA, Nursing Homes / Clarify orders / etc []  - 0 Routine Transfer to another Facility (non-emergent condition) []  - 0 Routine Hospital Admission (non-emergent condition) X- 1 15 New Admissions / Biomedical engineer / Ordering NPWT, Apligraf, etc. []  - 0 Emergency Hospital Admission (emergent condition) X- 1 10 Simple Discharge Coordination []  - 0 Complex (extensive) Discharge Coordination PROCESS - Special Needs []  - Pediatric / Minor Patient Management 0 []  - 0 Isolation Patient Management Stanley Marks, Stanley Marks ([id number]) []  - 0 Hearing /  Language / Visual special needs []  - 0 Assessment of Community assistance (transportation, D/C planning, etc.) []  - 0 Additional assistance / Altered mentation []  - 0 Support Surface(s) Assessment (bed, cushion, seat, etc.) INTERVENTIONS - Wound Cleansing / Measurement X - Wound Imaging (photographs - any number  of wounds) 1 5 []  - 0 Wound Tracing (instead of photographs) []  - 0 Simple Wound Measurement - one wound X- 3 5 Complex Wound Measurement - multiple wounds []  - 0 Simple Wound Cleansing - one wound X- 3 5 Complex Wound Cleansing - multiple wounds INTERVENTIONS - Wound Dressings []  - Small Wound Dressing one or multiple wounds 0 []  - 0 Medium Wound Dressing one or multiple wounds X- 1 20 Large Wound Dressing one or multiple wounds []  - 0 Application of Medications - injection INTERVENTIONS - Miscellaneous []  - External ear exam 0 []  - 0 Specimen Collection (cultures, biopsies, blood, body fluids, etc.) []  - 0 Specimen(s) / Culture(s) sent or taken to Lab for analysis []  - 0 Patient Transfer (multiple staff / Civil Service fast streamer / Similar devices) []  - 0 Simple Staple / Suture removal (25 or less) []  - 0 Complex Staple / Suture removal (26 or more) []  - 0 Hypo / Hyperglycemic Management (close monitor of Blood Glucose) X- 1 15 Ankle / Brachial Index (ABI) - do not check if billed separately Has the patient been seen at the hospital within the last three years: Yes Total Score: 180 Level Of Care: New/Established - Level 5 Electronic Signature(s) Signed: 01/19/2020 4:13:47 PM By: Montey Hora Entered By: Montey Hora on 01/19/2020 15:06:34 Stanley Marks ([id number]) -------------------------------------------------------------------------------- Encounter Discharge Information Details Patient Name: Stanley Marks Date of Service: 01/19/2020 2:15 PM Medical Record Number: [id number] Patient Account Number: [id number] Date of Birth/Sex: [birth year] (79 y.o. M) Treating  RN: Montey Hora Primary Care Apostolos Blagg: Lamonte Sakai Other Clinician: Referring Mustaf Antonacci: Referral, Self Treating Tamanna Whitson/Extender: Melburn Hake, HOYT Weeks in Treatment: 0 Encounter Discharge Information Items Discharge Condition: Stable Ambulatory Status: Walker Discharge Destination: Home Transportation: Private Auto Accompanied By: daughter Schedule Follow-up Appointment: Yes Clinical Summary of Care: Electronic Signature(s) Signed: 01/19/2020 4:13:47 PM By: Montey Hora Entered By: Montey Hora on 01/19/2020 15:07:53 Stanley Marks ([id number]) -------------------------------------------------------------------------------- Lower Extremity Assessment Details Patient Name: Stanley Marks Date of Service: 01/19/2020 2:15 PM Medical Record Number: [id number] Patient Account Number: [id number] Date of Birth/Sex: 05-Jun-1941 (79 y.o. M) Treating RN: Army Melia Primary Care Danuta Huseman: Lamonte Sakai Other Clinician: Referring Nathon Stefanski: Referral, Self Treating Amar Sippel/Extender: STONE III, HOYT Weeks in Treatment: 0 Edema Assessment Assessed: [Left: No] [Right: No] Edema: [Left: Ye] [Right: s] Calf Left: Right: Point of Measurement: 35 cm From Medial Instep 45.5 cm cm Ankle Left: Right: Point of Measurement: 13 cm From Medial Instep 27.5 cm cm Vascular Assessment Pulses: Dorsalis Pedis Palpable: [Left:Yes] Blood Pressure: Brachial: [Left:110] [Right:110] Dorsalis Pedis: 136 [Left:Dorsalis Pedis: 150] Ankle: Posterior Tibial: 134 [Left:Posterior Tibial: 140 1.24] [Right:1.36] Notes unable to obtain ABIs today d/t pain; ABI values imported from 10/14/19 visit Electronic Signature(s) Signed: 01/19/2020 4:13:47 PM By: Montey Hora Signed: 01/20/2020 11:18:27 AM By: Army Melia Entered By: Montey Hora on 01/19/2020 15:03:58 Stanley Marks ([id number]) -------------------------------------------------------------------------------- Multi Wound Chart Details Patient Name:  Stanley Marks Date of Service: 01/19/2020 2:15 PM Medical Record Number: [id number] Patient Account Number: [id number] Date of Birth/Sex: [birth year] (79 y.o. 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  Ulcer Date Acquired: 12/30/2019 12/30/2019 12/30/2019 Weeks of Treatment: 0 0 0 Wound Status: Open Open Open Measurements L x W x D 7.7x7.5x0.1 4.6x4.2x0.1 1.5x1x0.1 (cm) Area (cm) : 45.357 15.174 1.178 Volume (cm) : 4.536 1.517 0.118 Classification: Full Thickness Without Full Thickness Without Full Thickness Without Exposed Support Structures Exposed Support Structures Exposed Support Structures Exudate Amount: Large Medium Medium Exudate Type: Serosanguineous Serosanguineous Serosanguineous Exudate Color: red, brown red, brown red, brown Granulation Amount: Large (67-100%) Large (67-100%) Small (1-33%) Granulation Quality: Red Red Red Necrotic Amount: Small (1-33%) Small (1-33%) Large (67-100%) Necrotic Tissue: Adherent [place] Exposed Structures: Fat Layer (Subcutaneous Fat Layer (Subcutaneous Fat Layer (Subcutaneous Tissue) Exposed: Yes Tissue) Exposed: Yes Tissue) Exposed: Yes Fascia: No Fascia: No Fascia: No Tendon: No Tendon: No Tendon: No Muscle: No Muscle: No Muscle: No Joint: No Joint: No Joint: No Bone: No Bone: No Bone: No Epithelialization: N/A None None Stanley Marks, Stanley Marks ([id number]) Treatment Notes Electronic  Signature(s) Signed: 01/19/2020 4:13:47 PM By: Montey Hora Entered By: Montey Hora on 01/19/2020 15:00:41 Stanley Marks ([id number]) -------------------------------------------------------------------------------- Multi-Disciplinary Care Plan Details Patient Name: Stanley Marks Date of Service: 01/19/2020 2:15 PM Medical Record Number: [id number] Patient Account Number: [id number] Date of Birth/Sex: 03/22/1941 (79 y.o. M) Treating RN: Montey Hora Primary Care Shrinika Blatz: Lamonte Sakai Other Clinician: Referring Taseen Marasigan: Referral, Self Treating Meliah Appleman/Extender: Melburn Hake, HOYT Weeks in Treatment: 0 Active Inactive Abuse / Safety / Falls / Self Care Management Nursing Diagnoses: Potential for falls Goals: Patient will remain injury free related to falls Date Initiated: 01/19/2020 Target Resolution Date: 04/16/2020 Goal Status: Active Interventions: Assess fall risk on admission and as needed Notes: Orientation to the Wound Care Program Nursing Diagnoses: Knowledge deficit related to the wound healing center program Goals: Patient/caregiver will verbalize understanding of the [place] Program Date Initiated: 01/19/2020 Target Resolution Date: 04/16/2020 Goal Status: Active Interventions: Provide education on orientation to the wound center Notes: Venous Leg Ulcer Nursing Diagnoses: Potential for venous Insuffiency (use before diagnosis confirmed) Goals: Patient will maintain optimal edema control Date Initiated: 01/19/2020 Target Resolution Date: 04/16/2020 Goal Status: Active Interventions: Provide education on venous insufficiency Stanley Marks, Stanley Marks ([id number]) Notes: Wound/Skin Impairment Nursing Diagnoses: Impaired tissue integrity Goals: Ulcer/skin breakdown will heal within 14 weeks Date Initiated: 01/19/2020 Target Resolution Date: 04/16/2020 Goal Status: Active Interventions: Assess patient/caregiver ability to obtain necessary supplies Assess  patient/caregiver ability to perform ulcer/skin care regimen upon admission and as needed Assess ulceration(s) every visit Notes: Electronic Signature(s) Signed: 01/19/2020 4:13:47 PM By: Montey Hora Entered By: Montey Hora on 01/19/2020 14:59:06 Stanley Marks ([id number]) -------------------------------------------------------------------------------- Pain Assessment Details Patient Name: Stanley Marks Date of Service: 01/19/2020 2:15 PM Medical Record Number: [id number] Patient Account Number: [id number] Date of Birth/Sex: Aug 04, 1941 (79 y.o. M) Treating RN: Army Melia Primary Care Lianette Broussard: Lamonte Sakai Other Clinician: Referring Jhanae Jaskowiak: Referral, Self Treating Manuelito Poage/Extender: STONE III, HOYT Weeks in Treatment: 0 Active Problems Location of Pain Severity and Description of Pain Patient Has Paino Yes Site Locations Pain Location: Pain in Ulcers Rate the pain. Current Pain Level: 6 Pain Management and Medication Current Pain Management: Electronic Signature(s) Signed: 01/20/2020 11:18:27 AM By: Army Melia Entered By: Army Melia on 01/19/2020 14:21:37 Stanley Marks ([id number]) -------------------------------------------------------------------------------- Patient/Caregiver Education Details Patient Name: Stanley Marks Date of Service: 01/19/2020 2:15 PM Medical Record Number: [id number] Patient Account Number: [id number] Date of Birth/Gender: Aug 29, 1941 (79 y.o. M) Treating RN: Montey Hora Primary Care Physician: Lamonte Sakai Other Clinician: Referring Physician: Referral, Self Treating Physician/Extender: STONE III, HOYT Weeks in Treatment:  0 Education Assessment Education Provided To: Patient and Caregiver Education Topics Provided Venous: Handouts: Other: leg elevation Methods: Explain/Verbal Responses: State content correctly Electronic Signature(s) Signed: 01/19/2020 4:13:47 PM By: Montey Hora Entered By: Montey Hora on 01/19/2020  15:07:01 Stanley Marks ([id number]) -------------------------------------------------------------------------------- Wound Assessment Details Patient Name: Stanley Marks Date of Service: 01/19/2020 2:15 PM Medical Record Number: [id number] Patient Account Number: [id number] Date of Birth/Sex: 01-Jan-1941 (79 y.o. M) Treating RN: Army Melia Primary Care Salil Raineri: Lamonte Sakai Other Clinician: Referring Chandler Stofer: Referral, Self Treating Mystique Bjelland/Extender: STONE III, HOYT Weeks in Treatment: 0 Wound Status Wound Number: 6 Primary Etiology: Venous Leg Ulcer Wound Location: Left Lower Leg - Proximal Wound Status: Open Wounding Event: Blister Date Acquired: 12/30/2019 Weeks Of Treatment: 0 Clustered Wound: No Photos Wound Measurements Length: (cm) 7.7 Width: (cm) 7.5 Depth: (cm) 0.1 Area: (cm) 45.357 Volume: (cm) 4.536 % Reduction in Area: % Reduction in Volume: Tunneling: No Undermining: No Wound Description Full Thickness Without Exposed Support Foul Classification: Structures Sloug Exudate Large Amount: Exudate Type: Serosanguineous Exudate Color: red, brown Odor After Cleansing: No h/Fibrino Yes Wound Bed Granulation Amount: Large (67-100%) Exposed Structure Granulation Quality: Red Fascia Exposed: No Necrotic Amount: Small (1-33%) Fat Layer (Subcutaneous Tissue) Exposed: Yes Necrotic Quality: Adherent Slough Tendon Exposed: No Muscle Exposed: No Joint Exposed: No Bone Exposed: No Electronic Signature(s) Stanley Marks, Stanley Marks ([id number]) Signed: 01/20/2020 11:18:27 AM By: Army Melia Entered By: Army Melia on 01/19/2020 14:26:33 Stanley Marks ([id number]) -------------------------------------------------------------------------------- Wound Assessment Details Patient Name: Stanley Marks Date of Service: 01/19/2020 2:15 PM Medical Record Number: [id number] Patient Account Number: [id number] Date of Birth/Sex: 14-Jan-1941 (79 y.o. M) Treating RN: Army Melia Primary Care Sequan Auxier: Lamonte Sakai Other Clinician: Referring Judiann Celia: Referral, Self Treating Boyd Litaker/Extender: STONE III, HOYT Weeks in Treatment: 0 Wound Status Wound Number: 7 Primary Etiology: Venous Leg Ulcer Wound Location: Left Lower Leg - Posterior, Distal Wound Status: Open Wounding Event: Blister Date Acquired: 12/30/2019 Weeks Of Treatment: 0 Clustered Wound: No Photos Wound Measurements Length: (cm) 4.6 Width: (cm) 4.2 Depth: (cm) 0.1 Area: (cm) 15.174 Volume: (cm) 1.517 % Reduction in Area: % Reduction in Volume: Epithelialization: None Tunneling: No Undermining: No Wound Description Full Thickness Without Exposed Support Foul Classification: Structures Sloug Exudate Medium Amount: Exudate Type: Serosanguineous Exudate Color: red, brown Odor After Cleansing: No h/Fibrino Yes Wound Bed Granulation Amount: Large (67-100%) Exposed Structure Granulation Quality: Red Fascia Exposed: No Necrotic Amount: Small (1-33%) Fat Layer (Subcutaneous Tissue) Exposed: Yes Necrotic Quality: Adherent Slough Tendon Exposed: No Muscle Exposed: No Joint Exposed: No Bone Exposed: No Treatment Notes Stanley Marks, Stanley Marks ([id number]) Wound #7 (Left, Distal, Posterior Lower Leg) Notes silvercel, abd, kerlix/coban wrap with unna to anchor Electronic Signature(s) Signed: 01/20/2020 11:18:27 AM By: Army Melia Entered By: Army Melia on 01/19/2020 14:27:50 Stanley Marks ([id number]) -------------------------------------------------------------------------------- Wound Assessment Details Patient Name: Stanley Marks Date of Service: 01/19/2020 2:15 PM Medical Record Number: [id number] Patient Account Number: [id number] Date of Birth/Sex: 11-11-1941 (79 y.o. M) Treating RN: Army Melia Primary Care Dayjah Selman: Lamonte Sakai Other Clinician: Referring Nakkia Mackiewicz: Referral, Self Treating Kenichi Cassada/Extender: STONE III, HOYT Weeks in Treatment: 0 Wound Status Wound Number:  8 Primary Etiology: Venous Leg Ulcer Wound Location: Left Lower Leg - Anterior Wound Status: Open Wounding Event: Blister Date Acquired: 12/30/2019 Weeks Of Treatment: 0 Clustered Wound: No Photos Wound Measurements Length: (cm) 1.5 Width: (cm) 1 Depth: (cm) 0.1 Area: (cm) 1.178 Volume: (cm) 0.118 % Reduction in Area: % Reduction in Volume: Epithelialization: None Tunneling: No Undermining: No Wound Description Full Thickness Without Exposed  Support Foul Classification: Structures Sloug Exudate Medium Amount: Exudate Type: Serosanguineous Exudate Color: red, brown Odor After Cleansing: No h/Fibrino Yes Wound Bed Granulation Amount: Small (1-33%) Exposed Structure Granulation Quality: Red Fascia Exposed: No Necrotic Amount: Large (67-100%) Fat Layer (Subcutaneous Tissue) Exposed: Yes Necrotic Quality: Eschar Tendon Exposed: No Muscle Exposed: No Joint Exposed: No Bone Exposed: No Treatment Notes Stanley Marks, Stanley Marks ([id number]) Wound #8 (Left, Anterior Lower Leg) Notes silvercel, abd, kerlix/coban wrap with unna to anchor Electronic Signature(s) Signed: 01/20/2020 11:18:27 AM By: Army Melia Entered By: Army Melia on 01/19/2020 14:31:03 Stanley Marks ([id number]) -------------------------------------------------------------------------------- Vitals Details Patient Name: Stanley Marks Date of Service: 01/19/2020 2:15 PM Medical Record Number: [id number] Patient Account Number: [id number] Date of Birth/Sex: January 07, 1941 (79 y.o. M) Treating RN: Army Melia Primary Care Joannie Medine: Lamonte Sakai Other Clinician: Referring Assad Harbeson: Referral, Self Treating Ronav Furney/Extender: STONE III, HOYT Weeks in Treatment: 0 Vital Signs Time Taken: 14:21 Temperature (F): 98.0 Height (in): 74 Pulse (bpm): 81 Source: Stated Respiratory Rate (breaths/min): 16 Weight (lbs): 290 Blood Pressure (mmHg): 100/58 Source: Stated Reference Range: 80 - 120 mg / dl Body Mass Index  (BMI): 37.2 Electronic Signature(s) Signed: 01/20/2020 11:18:27 AM By: Army Melia Entered By: Army Melia on 01/19/2020 14:22:30

## 2020-01-22 ENCOUNTER — Emergency Department: Payer: Medicare Other

## 2020-01-22 ENCOUNTER — Encounter: Payer: Self-pay | Admitting: Intensive Care

## 2020-01-22 ENCOUNTER — Other Ambulatory Visit: Payer: Self-pay

## 2020-01-22 ENCOUNTER — Inpatient Hospital Stay
Admission: EM | Admit: 2020-01-22 | Discharge: 2020-01-28 | DRG: 308 | Disposition: A | Discharge status: Home / Self Care | Payer: Medicare Other | Attending: Internal Medicine | Admitting: Internal Medicine

## 2020-01-22 DIAGNOSIS — M79606 Pain in leg, unspecified: Secondary | ICD-10-CM

## 2020-01-22 DIAGNOSIS — I5022 Chronic systolic (congestive) heart failure: Secondary | ICD-10-CM | POA: Diagnosis present

## 2020-01-22 DIAGNOSIS — J439 Emphysema, unspecified: Secondary | ICD-10-CM | POA: Diagnosis present

## 2020-01-22 DIAGNOSIS — I739 Peripheral vascular disease, unspecified: Secondary | ICD-10-CM | POA: Diagnosis present

## 2020-01-22 DIAGNOSIS — Z6838 Body mass index (BMI) 38.0-38.9, adult: Secondary | ICD-10-CM

## 2020-01-22 DIAGNOSIS — G4733 Obstructive sleep apnea (adult) (pediatric): Secondary | ICD-10-CM | POA: Diagnosis present

## 2020-01-22 DIAGNOSIS — R680 Hypothermia, not associated with low environmental temperature: Secondary | ICD-10-CM | POA: Diagnosis present

## 2020-01-22 DIAGNOSIS — J9621 Acute and chronic respiratory failure with hypoxia: Secondary | ICD-10-CM | POA: Diagnosis present

## 2020-01-22 DIAGNOSIS — S81802A Unspecified open wound, left lower leg, initial encounter: Secondary | ICD-10-CM | POA: Diagnosis not present

## 2020-01-22 DIAGNOSIS — Z79899 Other long term (current) drug therapy: Secondary | ICD-10-CM | POA: Diagnosis not present

## 2020-01-22 DIAGNOSIS — R531 Weakness: Secondary | ICD-10-CM | POA: Diagnosis present

## 2020-01-22 DIAGNOSIS — Z888 Allergy status to other drugs, medicaments and biological substances status: Secondary | ICD-10-CM | POA: Diagnosis not present

## 2020-01-22 DIAGNOSIS — I482 Chronic atrial fibrillation, unspecified: Secondary | ICD-10-CM | POA: Diagnosis present

## 2020-01-22 DIAGNOSIS — I10 Essential (primary) hypertension: Secondary | ICD-10-CM

## 2020-01-22 DIAGNOSIS — Z8701 Personal history of pneumonia (recurrent): Secondary | ICD-10-CM | POA: Diagnosis not present

## 2020-01-22 DIAGNOSIS — I4891 Unspecified atrial fibrillation: Secondary | ICD-10-CM

## 2020-01-22 DIAGNOSIS — Z7901 Long term (current) use of anticoagulants: Secondary | ICD-10-CM

## 2020-01-22 DIAGNOSIS — U071 COVID-19: Secondary | ICD-10-CM

## 2020-01-22 DIAGNOSIS — J1282 Pneumonia due to coronavirus disease 2019: Secondary | ICD-10-CM

## 2020-01-22 DIAGNOSIS — F17210 Nicotine dependence, cigarettes, uncomplicated: Secondary | ICD-10-CM | POA: Diagnosis present

## 2020-01-22 DIAGNOSIS — E669 Obesity, unspecified: Secondary | ICD-10-CM

## 2020-01-22 DIAGNOSIS — R627 Adult failure to thrive: Secondary | ICD-10-CM | POA: Diagnosis present

## 2020-01-22 DIAGNOSIS — J441 Chronic obstructive pulmonary disease with (acute) exacerbation: Secondary | ICD-10-CM | POA: Diagnosis not present

## 2020-01-22 DIAGNOSIS — I110 Hypertensive heart disease with heart failure: Secondary | ICD-10-CM | POA: Diagnosis present

## 2020-01-22 DIAGNOSIS — J9610 Chronic respiratory failure, unspecified whether with hypoxia or hypercapnia: Secondary | ICD-10-CM | POA: Diagnosis present

## 2020-01-22 DIAGNOSIS — E872 Acidosis: Secondary | ICD-10-CM | POA: Diagnosis present

## 2020-01-22 HISTORY — DX: COVID-19: U07.1

## 2020-01-22 LAB — MRSA PCR SCREENING: MRSA by PCR: NEGATIVE

## 2020-01-22 LAB — CBC WITH DIFFERENTIAL/PLATELET
Abs Immature Granulocytes: 0.02 10*3/uL (ref 0.00–0.07)
Basophils Absolute: 0.1 10*3/uL (ref 0.0–0.1)
Basophils Relative: 1 %
Eosinophils Absolute: 0.1 10*3/uL (ref 0.0–0.5)
Eosinophils Relative: 2 %
HCT: 50 % (ref 39.0–52.0)
Hemoglobin: 16 g/dL (ref 13.0–17.0)
Immature Granulocytes: 0 %
Lymphocytes Relative: 14 %
Lymphs Abs: 1.2 10*3/uL (ref 0.7–4.0)
MCH: 30.3 pg (ref 26.0–34.0)
MCHC: 32 g/dL (ref 30.0–36.0)
MCV: 94.7 fL (ref 80.0–100.0)
Monocytes Absolute: 0.6 10*3/uL (ref 0.1–1.0)
Monocytes Relative: 8 %
Neutro Abs: 6 10*3/uL (ref 1.7–7.7)
Neutrophils Relative %: 75 %
Platelets: 231 10*3/uL (ref 150–400)
RBC: 5.28 MIL/uL (ref 4.22–5.81)
RDW: 16.5 % — ABNORMAL HIGH (ref 11.5–15.5)
WBC: 8 10*3/uL (ref 4.0–10.5)
nRBC: 0 % (ref 0.0–0.2)

## 2020-01-22 LAB — DIGOXIN LEVEL: Digoxin Level: <0.2 ng/mL — ABNORMAL LOW (ref 0.8–2.0)

## 2020-01-22 LAB — LACTIC ACID, PLASMA
Lactic Acid, Venous: 1.4 mmol/L (ref 0.5–1.9)
Lactic Acid, Venous: 2.2 mmol/L (ref 0.5–1.9)

## 2020-01-22 LAB — COMPREHENSIVE METABOLIC PANEL
ALT: 20 U/L (ref 0–44)
AST: 18 U/L (ref 15–41)
Albumin: 3.3 g/dL — ABNORMAL LOW (ref 3.5–5.0)
Alkaline Phosphatase: 67 U/L (ref 38–126)
Anion gap: 18 — ABNORMAL HIGH (ref 5–15)
BUN: 13 mg/dL (ref 8–23)
CO2: 29 mmol/L (ref 22–32)
Calcium: 9.4 mg/dL (ref 8.9–10.3)
Chloride: 98 mmol/L (ref 98–111)
Creatinine, Ser: 1.47 mg/dL — ABNORMAL HIGH (ref 0.61–1.24)
GFR calc Af Amer: 53 mL/min — ABNORMAL LOW (ref >60)
GFR calc non Af Amer: 46 mL/min — ABNORMAL LOW (ref >60)
Glucose, Bld: 114 mg/dL — ABNORMAL HIGH (ref 70–99)
Potassium: 3.5 mmol/L (ref 3.5–5.1)
Sodium: 145 mmol/L (ref 135–145)
Total Bilirubin: 1.2 mg/dL (ref 0.3–1.2)
Total Protein: 6.8 g/dL (ref 6.5–8.1)

## 2020-01-22 LAB — TROPONIN I (HIGH SENSITIVITY)
Troponin I (High Sensitivity): 6 ng/L (ref <18)
Troponin I (High Sensitivity): 4 ng/L (ref <18)

## 2020-01-22 LAB — RESPIRATORY PANEL BY RT PCR (FLU A&B, COVID)
Influenza A by PCR: NEGATIVE
Influenza B by PCR: NEGATIVE
SARS Coronavirus 2 by RT PCR: POSITIVE — AB

## 2020-01-22 LAB — BRAIN NATRIURETIC PEPTIDE: B Natriuretic Peptide: 159 pg/mL — ABNORMAL HIGH (ref 0.0–100.0)

## 2020-01-22 MED ORDER — DILTIAZEM HCL-DEXTROSE 125-5 MG/125ML-% IV SOLN (PREMIX)
5.0000 mg/h | INTRAVENOUS | Status: DC
  Administered 2020-01-22: 5 mg/h via INTRAVENOUS
  Administered 2020-01-23: 15 mg/h via INTRAVENOUS
  Administered 2020-01-23: 10 mg/h via INTRAVENOUS
  Administered 2020-01-24: 15 mg/h via INTRAVENOUS
  Filled 2020-01-22 (×5): qty 125

## 2020-01-22 MED ORDER — CHLORHEXIDINE GLUCONATE CLOTH 2 % EX PADS
6.0000 | MEDICATED_PAD | Freq: Every day | CUTANEOUS | Status: DC
  Administered 2020-01-22 – 2020-01-27 (×3): 6 via TOPICAL

## 2020-01-22 MED ORDER — SODIUM CHLORIDE 0.9 % IV BOLUS
500.0000 mL | Freq: Once | INTRAVENOUS | Status: AC
  Administered 2020-01-22: 500 mL via INTRAVENOUS

## 2020-01-22 MED ORDER — ADULT MULTIVITAMIN W/MINERALS CH
1.0000 | ORAL_TABLET | Freq: Every day | ORAL | Status: DC
Start: 2020-01-22 — End: 2020-01-28
  Administered 2020-01-23 – 2020-01-28 (×6): 1 via ORAL
  Filled 2020-01-22 (×7): qty 1

## 2020-01-22 MED ORDER — METHYLPREDNISOLONE SODIUM SUCC 125 MG IJ SOLR
60.0000 mg | Freq: Two times a day (BID) | INTRAMUSCULAR | Status: DC
  Administered 2020-01-22 – 2020-01-23 (×2): 60 mg via INTRAVENOUS
  Filled 2020-01-22 (×2): qty 2

## 2020-01-22 MED ORDER — IPRATROPIUM BROMIDE HFA 17 MCG/ACT IN AERS
2.0000 | INHALATION_SPRAY | RESPIRATORY_TRACT | Status: DC
  Administered 2020-01-23 – 2020-01-28 (×29): 2 via RESPIRATORY_TRACT
  Filled 2020-01-22 (×3): qty 12.9

## 2020-01-22 MED ORDER — ZINC SULFATE 220 (50 ZN) MG PO CAPS
220.0000 mg | ORAL_CAPSULE | Freq: Every day | ORAL | Status: DC
  Administered 2020-01-23 – 2020-01-28 (×6): 220 mg via ORAL
  Filled 2020-01-22 (×5): qty 1

## 2020-01-22 MED ORDER — DILTIAZEM LOAD VIA INFUSION
10.0000 mg | Freq: Once | INTRAVENOUS | Status: AC
  Administered 2020-01-22: 10 mg via INTRAVENOUS
  Filled 2020-01-22: qty 10

## 2020-01-22 MED ORDER — RIVAROXABAN 15 MG PO TABS
15.0000 mg | ORAL_TABLET | Freq: Every day | ORAL | Status: DC
  Administered 2020-01-22: 15 mg via ORAL

## 2020-01-22 MED ORDER — FENTANYL CITRATE (PF) 100 MCG/2ML IJ SOLN
50.0000 ug | Freq: Once | INTRAMUSCULAR | Status: AC
  Administered 2020-01-22: 50 ug via INTRAVENOUS
  Filled 2020-01-22: qty 2

## 2020-01-22 MED ORDER — DM-GUAIFENESIN ER 30-600 MG PO TB12
1.0000 | ORAL_TABLET | Freq: Two times a day (BID) | ORAL | Status: DC
  Administered 2020-01-23 – 2020-01-28 (×11): 1 via ORAL
  Filled 2020-01-22 (×10): qty 1

## 2020-01-22 MED ORDER — SODIUM CHLORIDE 0.9 % IV SOLN: 1.0000 g | Freq: Once | INTRAVENOUS | Status: DC

## 2020-01-22 MED ORDER — SACUBITRIL-VALSARTAN 49-51 MG PO TABS
1.0000 | ORAL_TABLET | Freq: Two times a day (BID) | ORAL | Status: DC
  Administered 2020-01-23 – 2020-01-28 (×11): 1 via ORAL
  Filled 2020-01-22 (×12): qty 1

## 2020-01-22 MED ORDER — ACETAMINOPHEN 325 MG PO TABS
650.0000 mg | ORAL_TABLET | Freq: Four times a day (QID) | ORAL | Status: DC | PRN
  Administered 2020-01-24 – 2020-01-28 (×9): 650 mg via ORAL
  Filled 2020-01-22 (×9): qty 2

## 2020-01-22 MED ORDER — ALBUTEROL SULFATE HFA 108 (90 BASE) MCG/ACT IN AERS
2.0000 | INHALATION_SPRAY | RESPIRATORY_TRACT | Status: DC | PRN
  Filled 2020-01-22: qty 6.7

## 2020-01-22 MED ORDER — ASCORBIC ACID 500 MG PO TABS
500.0000 mg | ORAL_TABLET | Freq: Every day | ORAL | Status: DC
  Administered 2020-01-22 – 2020-01-28 (×7): 500 mg via ORAL
  Filled 2020-01-22 (×6): qty 1

## 2020-01-22 MED ORDER — DILTIAZEM HCL ER 60 MG PO CP12
60.0000 mg | ORAL_CAPSULE | Freq: Two times a day (BID) | ORAL | Status: DC
  Administered 2020-01-22 – 2020-01-24 (×5): 60 mg via ORAL
  Filled 2020-01-22 (×6): qty 1

## 2020-01-22 MED ORDER — CALCIUM GLUCONATE-NACL 1-0.675 GM/50ML-% IV SOLN
1.0000 g | Freq: Once | INTRAVENOUS | Status: AC
  Administered 2020-01-22: 1000 mg via INTRAVENOUS
  Filled 2020-01-22: qty 50

## 2020-01-22 MED ORDER — NICOTINE 21 MG/24HR TD PT24
21.0000 mg | MEDICATED_PATCH | Freq: Every day | TRANSDERMAL | Status: DC
  Administered 2020-01-22 – 2020-01-28 (×7): 21 mg via TRANSDERMAL
  Filled 2020-01-22 (×7): qty 1

## 2020-01-22 MED ORDER — ACETAMINOPHEN 650 MG RE SUPP: 650.0000 mg | Freq: Four times a day (QID) | RECTAL | Status: DC | PRN

## 2020-01-22 NOTE — ED Notes (Signed)
Attempted to call report x2

## 2020-01-22 NOTE — ED Provider Notes (Signed)
[facility] Emergency Department Provider Note  ____________________________________________   First MD Initiated Contact with Patient 01/22/20 1027     (approximate)  I have reviewed the triage vital signs and the nursing notes.   HISTORY  Chief Complaint Shortness of Breath, Failure To Thrive, and Dizziness    HPI Stanley Marks is a 79 y.o. male with past medical history as below including COPD, A. fib, hypertension, CHF, recent Covid diagnosis, here with multiple complaints.  The patient reportedly has been struggling since the passage of his wife in May.  Has had multiple ED visits as well as admissions for respiratory failure, Covid, and medication nonadherence.  Reportedly only takes his medications when he feels like he needs them.  He states that over the last several days, he has had progressively worsening generalized weakness, shortness of breath, and fatigue.  He said poor appetite and has had little to nothing to eat or drink.  He was also been dealing with a left lower extremity wound which appears to be increasingly painful, though he was recently started at the wound clinic for this.  Denies any known fevers or chills.  No known coronavirus reexposures.  No chest pain.  No specific alleviating factors.  Symptoms are worse with exertion.       Past Medical History:  Diagnosis Date  . Cancer ([place])    skin  . Emphysema of lung ([place])   . Hypertension   . PVD (peripheral vascular disease) [facility])     Patient Active Problem List   Diagnosis Date Noted  . Acute respiratory failure due to COVID-19 ([place]) 12/22/2019  . COPD with chronic bronchitis ([place]) 12/22/2019  . Atrial fibrillation, chronic ([place]) 12/22/2019  . Acute respiratory failure with hypoxia ([place]) 12/22/2019  . Pneumonia due to COVID-19 virus 12/22/2019  . Atrial fibrillation with RVR ([place])   . Essential hypertension   . Influenza A 01/28/2017  . Pneumonia 01/28/2017  . COPD  exacerbation ([place]) 01/28/2017  . Tobacco abuse counseling 01/28/2017  . Patient's noncompliance with other medical treatment and regimen 01/28/2017  . Emphysema of lung ([place]) 01/28/2017  . Leukocytosis 01/28/2017  . Chronic systolic CHF (congestive heart failure) ([place]) 01/28/2017  . Bradycardia 01/28/2017  . Acute and chronic respiratory failure with hypoxia ([place]) 01/26/2017  . Obstructive sleep apnea syndrome 03/01/2016  . Personal history of other malignant neoplasm of skin 12/08/2014    Past Surgical History:  Procedure Laterality Date  . APPENDECTOMY    . arm surgery    . ELECTROPHYSIOLOGIC STUDY N/A 11/20/2016   Procedure: CARDIOVERSION;  Surgeon: Dionisio David, MD;  Location: [facility] ORS;  Service: Cardiovascular;  Laterality: N/A;    Prior to Admission medications   Medication Sig Start Date End Date Taking? Authorizing Provider  ADVAIR HFA 230-21 MCG/ACT inhaler Inhale 1 puff into the lungs 2 (two) times daily as needed.  12/17/19  Yes [provider]  albuterol (VENTOLIN HFA) 108 (90 Base) MCG/ACT inhaler Inhale 2 puffs into the lungs every 6 (six) hours as needed for wheezing or shortness of breath. 12/21/19  Yes Vanessa Jennings, MD  Aspirin-Salicylamide-Caffeine (ARTHRITIS STRENGTH BC POWDER PO) Take 7 packets by mouth daily as needed.   Yes [provider]  diltiazem (CARDIZEM SR) 60 MG 12 hr capsule Take 1 capsule (60 mg total) by mouth every 12 (twelve) hours. 12/26/19  Yes Little Ishikawa, MD  ipratropium-albuterol (DUONEB) 0.5-2.5 (3) MG/3ML SOLN Inhale 3 mLs into the lungs every 6 (  six) hours as needed. 01/01/20  Yes [provider]  Multiple Vitamin (MULTIVITAMIN WITH MINERALS) TABS tablet Take 1 tablet by mouth daily. 12/22/19  Yes Wieting, Richard, MD  Rivaroxaban (XARELTO) 15 MG TABS tablet Take 15 mg by mouth daily with supper.   Yes [provider]  sacubitril-valsartan (ENTRESTO) 49-51 MG Take 1 tablet by mouth 2 (two) times daily.    Yes [provider]  spironolactone (ALDACTONE) 25 MG tablet Take 25 mg by mouth daily.   Yes [provider]  torsemide (DEMADEX) 20 MG tablet Take 20 mg by mouth daily.   Yes [provider]  zinc sulfate 220 (50 Zn) MG capsule Take 1 capsule (220 mg total) by mouth daily. 12/22/19  Yes Loletha Grayer, MD    Allergies Lipitor [atorvastatin calcium]  History reviewed. No pertinent family history.  Social History Social History   Tobacco Use  . Smoking status: Current Every Day Smoker    Packs/day: 0.00    Types: Cigarettes  . Smokeless tobacco: Never Used  Substance Use Topics  . 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 Drug use: Never    Review of Systems  Review of Systems  Constitutional: Positive for fatigue. Negative for chills and fever.  HENT: Negative for sore throat.   Respiratory: Positive for cough and shortness of breath.   Cardiovascular: Positive for palpitations. Negative for chest pain.  Gastrointestinal: Positive for nausea. Negative for abdominal pain.  Genitourinary: Negative for flank pain.  Musculoskeletal: Negative for neck pain.  Skin: Negative for rash and wound.  Allergic/Immunologic: Negative for immunocompromised state.  Neurological: Positive for weakness. Negative for numbness.  Hematological: Does not bruise/bleed easily.  All other systems reviewed and are negative.    ____________________________________________  PHYSICAL EXAM:      VITAL SIGNS: ED Triage Vitals  Enc Vitals Group     BP 01/22/20 1028 104/64     Pulse Rate 01/22/20 1028 (!) 141     Resp 01/22/20 1028 (!) 28     Temp 01/22/20 1028 97.8 F (36.6 C)     Temp Source 01/22/20 1028 Oral     SpO2 01/22/20 1027 92 %     Weight 01/22/20 1031 290 lb (131.5 kg)     Height 01/22/20 1031 6\' 2"  (1.88 m)     Head Circumference --      Peak Flow --      Pain Score 01/22/20 1030 5     Pain Loc --      Pain  Edu? --      Excl. in [place]? --      Physical Exam Vitals and nursing note reviewed.  Constitutional:      General: He is not in acute distress.    Appearance: He is well-developed. He is ill-appearing.  HENT:     Head: Normocephalic and atraumatic.     Mouth/Throat:     Comments: Markedly dry mucous membranes Eyes:     Conjunctiva/sclera: Conjunctivae normal.  Cardiovascular:     Rate and Rhythm: Tachycardia present. Rhythm irregular.     Heart sounds: Normal heart sounds.  Pulmonary:     Effort: Pulmonary effort is normal. Tachypnea present. No respiratory distress.     Breath sounds: Decreased breath sounds present. No wheezing.  Abdominal:     General: There is no distension.  Musculoskeletal:     Cervical back: Neck supple.     Right  lower leg: No edema.     Left lower leg: No edema.  Skin:    General: Skin is warm.     Capillary Refill: Capillary refill takes less than 2 seconds.     Findings: No rash.     Comments: Superficial wounds with fibrinous versus slightly purulent wound base to the left medial/posterior lower leg, as below.  Neurological:     Mental Status: He is alert and oriented to person, place, and time.     Motor: No abnormal muscle tone.         ____________________________________________   LABS (all labs ordered are listed, but only abnormal results are displayed)  Labs Reviewed  RESPIRATORY PANEL BY RT PCR (FLU A&B, COVID) - Abnormal; Notable for the following components:      Result Value   SARS Coronavirus 2 by RT PCR POSITIVE (*)    All other components within normal limits  CBC WITH DIFFERENTIAL/PLATELET - Abnormal; Notable for the following components:   RDW 16.5 (*)    All other components within normal limits  COMPREHENSIVE METABOLIC PANEL - Abnormal; Notable for the following components:   Glucose, Bld 114 (*)    Creatinine, Ser 1.47 (*)    Albumin 3.3 (*)    GFR calc non Af Amer 46 (*)    GFR calc Af Amer 53 (*)    Anion gap  18 (*)    All other components within normal limits  LACTIC ACID, PLASMA - Abnormal; Notable for the following components:   Lactic Acid, Venous 2.2 (*)    All other components within normal limits  BRAIN NATRIURETIC PEPTIDE - Abnormal; Notable for the following components:   B Natriuretic Peptide 159.0 (*)    All other components within normal limits  DIGOXIN LEVEL - Abnormal; Notable for the following components:   Digoxin Level <0.2 (*)    All other components within normal limits  LACTIC ACID, PLASMA  TROPONIN I (HIGH SENSITIVITY)  TROPONIN I (HIGH SENSITIVITY)    ____________________________________________  EKG: Atrial fibrillation, ventricular rate 153.  QRS 73, QTc 541.  Nonspecific ST changes, likely rate related. ________________________________________  RADIOLOGY All imaging, including plain films, CT scans, and ultrasounds, independently reviewed by me, and interpretations confirmed via formal radiology reads.  ED MD interpretation:   Chest x-ray: No acute abnormality, possible bronchitic changes versus edema  Official radiology report(s): DG Chest Portable 1 View  Result Date: 01/22/2020 CLINICAL DATA:  Shortness of breath and dizziness EXAM: PORTABLE CHEST 1 VIEW COMPARISON:  12/21/2019 FINDINGS: Cardiomegaly and interstitial opacity that was also seen on prior. Generous lung volumes with borderline flattening of the diaphragm. There is no edema, consolidation, effusion, or pneumothorax. IMPRESSION: Interstitial prominence that could be congestive or bronchitic, appearance similar to January 2021. Electronically Signed   By: Monte Fantasia M.D.   On: 01/22/2020 11:24   DG Tibia/Fibula Left Port  Result Date: 01/22/2020 CLINICAL DATA:  Left lower leg pain with wounds and redness EXAM: PORTABLE LEFT TIBIA AND FIBULA - 2 VIEW COMPARISON:  None. FINDINGS: Nonspecific subcutaneous reticulation in the leg with undulating lateral calf skin surface. No opaque foreign body or  soft tissue emphysema. Degenerative spurring at the knee and ankle which is mild. IMPRESSION: 1. Soft tissue swelling without gas or opaque foreign body. 2. No acute osseous finding. Electronically Signed   By: Monte Fantasia M.D.   On: 01/22/2020 11:27    ____________________________________________  PROCEDURES   Procedure(s) performed (including Critical Care):  .Critical  Care Performed by: Duffy Bruce, MD Authorized by: Duffy Bruce, MD   Critical care provider statement:    Critical care time (minutes):  35   Critical care time was exclusive of:  Separately billable procedures and treating other patients and teaching time   Critical care was necessary to treat or prevent imminent or life-threatening deterioration of the following conditions:  Cardiac failure, respiratory failure and circulatory failure   Critical care was time spent personally by me on the following activities:  Development of treatment plan with patient or surrogate, discussions with consultants, evaluation of patient's response to treatment, examination of patient, obtaining history from patient or surrogate, ordering and performing treatments and interventions, ordering and review of laboratory studies, ordering and review of radiographic studies, pulse oximetry, re-evaluation of patient's condition and review of old charts   I assumed direction of critical care for this patient from another provider in my specialty: no      ____________________________________________  INITIAL IMPRESSION / MDM / [place] / ED COURSE  As part of my medical decision making, I reviewed the following data within the [place] notes reviewed and incorporated, Old chart reviewed, Notes from prior ED visits, and [place] Controlled Substance Database       *Stanley Marks was evaluated in Emergency Department on 01/22/2020 for the symptoms described in the history of present illness. He was  evaluated in the context of the global COVID-19 pandemic, which necessitated consideration that the patient might be at risk for infection with the SARS-CoV-2 virus that causes COVID-19. Institutional protocols and algorithms that pertain to the evaluation of patients at risk for COVID-19 are in a state of rapid change based on information released by regulatory bodies including the CDC and federal and state organizations. These policies and algorithms were followed during the patient's care in the ED.  Some ED evaluations and interventions may be delayed as a result of limited staffing during the pandemic.*     Medical Decision Making:  79 yo M here with generalized weakness, fatigue. On arrival, pt noted to be in AFib RVR with relative hypotension. He appears dehydrated clinically and endorses poor PO intake. Given cautious fluids and placed on dilt drip with good effect. No fever noted but CXR shows possible atypical PNA, and COVID+ (though he has a h/o COVID last month). Otherwise, trop neg, no signs of ischemia. LA normal. No signs of sepsis. Suspect SOB 2/2 AFib RVR likely precipitated by med nonadherence as well as posisbly ongoing COVID. Admit to medicine on dilt drip.  Re: leg wounds - will need wound care nursing consult. No surrounding erythema, warmth, or signs of acute infection.  ____________________________________________  FINAL CLINICAL IMPRESSION(S) / ED DIAGNOSES  Final diagnoses:  Leg pain  Atrial fibrillation with rapid ventricular response (HCC)     MEDICATIONS GIVEN DURING THIS VISIT:  Medications  diltiazem (CARDIZEM) 1 mg/mL load via infusion 10 mg (10 mg Intravenous Bolus from Bag 01/22/20 1401)    And  diltiazem (CARDIZEM) 125 mg in dextrose 5% 125 mL (1 mg/mL) infusion (5 mg/hr Intravenous New Bag/Given 01/22/20 1400)  sodium chloride 0.9 % bolus 500 mL (0 mLs Intravenous Stopped 01/22/20 1401)  fentaNYL (SUBLIMAZE) injection 50 mcg (50 mcg Intravenous Given 01/22/20  1112)  calcium gluconate 1 g/ 50 mL sodium chloride IVPB (0 g Intravenous Stopped 01/22/20 1505)     ED Discharge Orders    None       Note:  This document  was prepared using Systems analyst and may include unintentional dictation errors.   Duffy Bruce, MD 01/22/20 [phone]

## 2020-01-22 NOTE — H&P (Signed)
History and Physical    Stanley Marks [id number] DOB: Feb 01, 1941 DOA: 01/22/2020  Referring MD/NP/PA:   PCP: Perrin Maltese, MD   Patient coming from:  The patient is coming from home.  At baseline, pt is independent for most of ADL.        Chief Complaint: Generalized weakness, fatigue, cough, shortness of breath, dizziness.  HPI: Stanley Marks is a 80 y.o. male with medical history significant of hypertension, COPD, PVD, atrial fibrillation on Xarelto, CHF, tobacco abuse, OSA, recently treated COVID-19 infection, who presents with generalized weakness, fatigue, cough, shortness breath, dizziness.  Patient states that in the past several days, he has been having generalized weakness, fatigue, dry cough, shortness of breath and dizziness.  No unilateral numbness or tingling in extremities, no facial droop or slurred speech.  Patient does not have chest pain, fever or chills.  He did not have nausea, vomiting, diarrhea or abdominal pain at home, but had one loose stool bowel movement in ED. He has left lower extremity wound which has been going on for more than two weeks and is more painful recently. He is getting treatment in wound clinic. Of note, patient was recently hospitalized from 1/12-1/16 due to COVID-19 infection.  Patient was treated with remdesivir and steroid.  Patient was found to have atrial fibrillation with RVR in ED, with heart rates up to 140s.  Cardizem drip is started in ED.  ED Course: pt was found to have BNP 159, troponin 6--> 4, lactic acid 1.4, 2.2, renal function close to baseline, RVP positive for COVID-19, temperature normal, soft blood pressure with blood pressure 79/62, which improved with 111/68 after giving 500 cc normal saline in ED.  Oxygen saturation 91% on room air, improved to 95% to 4 L nasal cannula oxygen chest x-ray showed interstitial prominence.  X-ray of left tibia/fibula showed soft tissue swelling, no bony fracture.  Patient is admitted to stepdown as  inpatient.  Review of Systems:   General: no fevers, chills, no body weight gain, has poor appetite, has fatigue HEENT: no blurry vision, hearing changes or sore throat Respiratory: has dyspnea, coughing, wheezing CV: no chest pain, no palpitations GI: no nausea, vomiting, abdominal pain, constipation GU: no dysuria, burning on urination, increased urinary frequency, hematuria  Ext: has trace leg edema Neuro: no unilateral weakness, numbness, or tingling, no vision change or hearing loss Skin: has wound in left lower leg MSK: No muscle spasm, no deformity, no limitation of range of movement in spin Heme: No easy bruising.  Travel history: No recent long distant travel.  Allergy:  Allergies  Allergen Reactions  . Lipitor [Atorvastatin Calcium] Other (See Comments)    arthralgia    Past Medical History:  Diagnosis Date  . Cancer ([place])    skin  . Emphysema of lung ([place])   . Hypertension   . PVD (peripheral vascular disease) ([place])     Past Surgical History:  Procedure Laterality Date  . APPENDECTOMY    . arm surgery    . ELECTROPHYSIOLOGIC STUDY N/A 11/20/2016   Procedure: CARDIOVERSION;  Surgeon: Dionisio David, MD;  Location: [facility] ORS;  Service: Cardiovascular;  Laterality: N/A;    Social History:  reports that he has been smoking cigarettes. He has been smoking about 0.00 packs per day. He has never used smokeless tobacco. He reports previous alcohol use. He reports that he does not use drugs.  Family History: History reviewed. No pertinent family history.   Prior to Admission medications  Medication Sig Start Date End Date Taking? Authorizing Provider  ADVAIR HFA 230-21 MCG/ACT inhaler Inhale 1 puff into the lungs 2 (two) times daily as needed.  12/17/19  Yes [provider]  albuterol (VENTOLIN HFA) 108 (90 Base) MCG/ACT inhaler Inhale 2 puffs into the lungs every 6 (six) hours as needed for wheezing or shortness of breath. 12/21/19  Yes Vanessa [place], MD    Aspirin-Salicylamide-Caffeine (ARTHRITIS STRENGTH BC POWDER PO) Take 7 packets by mouth daily as needed.   Yes [provider]  diltiazem (CARDIZEM SR) 60 MG 12 hr capsule Take 1 capsule (60 mg total) by mouth every 12 (twelve) hours. 12/26/19  Yes Little Ishikawa, MD  ipratropium-albuterol (DUONEB) 0.5-2.5 (3) MG/3ML SOLN Inhale 3 mLs into the lungs every 6 (six) hours as needed. 01/01/20  Yes [provider]  Multiple Vitamin (MULTIVITAMIN WITH MINERALS) TABS tablet Take 1 tablet by mouth daily. 12/22/19  Yes Wieting, Richard, MD  Rivaroxaban (XARELTO) 15 MG TABS tablet Take 15 mg by mouth daily with supper.   Yes [provider]  sacubitril-valsartan (ENTRESTO) 49-51 MG Take 1 tablet by mouth 2 (two) times daily.   Yes [provider]  spironolactone (ALDACTONE) 25 MG tablet Take 25 mg by mouth daily.   Yes [provider]  torsemide (DEMADEX) 20 MG tablet Take 20 mg by mouth daily.   Yes [provider]  zinc sulfate 220 (50 Zn) MG capsule Take 1 capsule (220 mg total) by mouth daily. 12/22/19  Yes Loletha Grayer, MD    Physical Exam: Vitals:   01/22/20 1725 01/22/20 1730 01/22/20 1735 01/22/20 1740  BP: 106/77 103/64 (!) 89/55 97/65  Pulse: (!) 54  88 72  Resp: 13 (!) 25 (!) 25 (!) 21  Temp:      TempSrc:      SpO2: 94%  93% 94%  Weight:      Height:       General: Not in acute distress HEENT:       Eyes: PERRL, EOMI, no scleral icterus.       ENT: No discharge from the ears and nose, no pharynx injection, no tonsillar enlargement.        Neck: No JVD, no bruit, no mass felt. Heme: No neck lymph node enlargement. Cardiac: S1/S2, RRR, No murmurs, No gallops or rubs. Respiratory: Has mild wheezing bilaterally GI: Soft, nondistended, nontender, no rebound pain, no organomegaly, BS present. GU: No hematuria Ext: has trace leg edema and chronic venous insufficiency change bilaterally. 2+DP/PT pulse  bilaterally. Musculoskeletal: No joint deformities, No joint redness or warmth, no limitation of ROM in spin. Skin: Has a chronic wound in the left lower leg Neuro: Alert, oriented X3, cranial nerves II-XII grossly intact, moves all extremities normally.  Psych: Patient is not psychotic, no suicidal or hemocidal ideation.  Labs on Admission: I have personally reviewed following labs and imaging studies  CBC: Recent Labs  Lab 01/22/20 1048  WBC 8.0  NEUTROABS 6.0  HGB 16.0  HCT 50.0  MCV 94.7  PLT [id number]   Basic Metabolic Panel: Recent Labs  Lab 01/22/20 1048  NA 145  K 3.5  CL 98  CO2 29  GLUCOSE 114*  BUN 13  CREATININE 1.47*  CALCIUM 9.4   GFR: Estimated Creatinine Clearance: 61.6 mL/min (A) (by C-G formula based on SCr of 1.47 mg/dL (H)). Liver Function Tests: Recent Labs  Lab 01/22/20 1048  AST 18  ALT 20  ALKPHOS 67  BILITOT 1.2  PROT 6.8  ALBUMIN 3.3*   No results for input(s): LIPASE, AMYLASE in the last 168 hours. No results for input(s): AMMONIA in the last 168 hours. Coagulation Profile: No results for input(s): INR, PROTIME in the last 168 hours. Cardiac Enzymes: No results for input(s): CKTOTAL, CKMB, CKMBINDEX, TROPONINI in the last 168 hours. BNP (last 3 results) No results for input(s): PROBNP in the last 8760 hours. HbA1C: No results for input(s): HGBA1C in the last 72 hours. CBG: No results for input(s): GLUCAP in the last 168 hours. Lipid Profile: No results for input(s): CHOL, HDL, LDLCALC, TRIG, CHOLHDL, LDLDIRECT in the last 72 hours. Thyroid Function Tests: No results for input(s): TSH, T4TOTAL, FREET4, T3FREE, THYROIDAB in the last 72 hours. Anemia Panel: No results for input(s): VITAMINB12, FOLATE, FERRITIN, TIBC, IRON, RETICCTPCT in the last 72 hours. Urine analysis: No results found for: COLORURINE, APPEARANCEUR, LABSPEC, PHURINE, GLUCOSEU, HGBUR, BILIRUBINUR, KETONESUR, PROTEINUR, UROBILINOGEN, NITRITE, LEUKOCYTESUR Sepsis  Labs: @LABRCNTIP (procalcitonin:4,lacticidven:4) ) Recent Results (from the past 240 hour(s))  Respiratory Panel by RT PCR (Flu A&B, Covid) - Nasopharyngeal Swab     Status: Abnormal   Collection Time: 01/22/20  2:52 PM   Specimen: Nasopharyngeal Swab  Result Value Ref Range Status   SARS Coronavirus 2 by RT PCR POSITIVE (A) NEGATIVE Final    Comment: RESULT CALLED TO, READ BACK BY AND VERIFIED WITH: CHRISSY BRAND @1546  01/22/20 MJU (NOTE) SARS-CoV-2 target nucleic acids are DETECTED. SARS-CoV-2 RNA is generally detectable in upper respiratory specimens  during the acute phase of infection. Positive results are indicative of the presence of the identified virus, but do not rule out bacterial infection or co-infection with other pathogens not detected by the test. Clinical correlation with patient history and other diagnostic information is necessary to determine patient infection status. The expected result is Negative. Fact Sheet for Patients:  PinkCheek.be Fact Sheet for Healthcare Providers: GravelBags.it This test is not yet approved or cleared by the [place] FDA and  has been authorized for detection and/or diagnosis of SARS-CoV-2 by FDA under an Emergency Use Authorization (EUA).  This EUA will remain in effect (meaning this test can be used) for t he duration of  the COVID-19 declaration under Section 564(b)(1) of the Act, 21 U.S.C. section 360bbb-3(b)(1), unless the authorization is terminated or revoked sooner.    Influenza A by PCR NEGATIVE NEGATIVE Final   Influenza B by PCR NEGATIVE NEGATIVE Final    Comment: (NOTE) The Xpert Xpress SARS-CoV-2/FLU/RSV assay is intended as an aid in  the diagnosis of influenza from Nasopharyngeal swab specimens and  should not be used as a sole basis for treatment. Nasal washings and  aspirates are unacceptable for Xpert Xpress SARS-CoV-2/FLU/RSV  testing. Fact Sheet for  Patients: PinkCheek.be Fact Sheet for Healthcare Providers: GravelBags.it This test is not yet approved or cleared by the [place] FDA and  has been authorized for detection and/or diagnosis of SARS-CoV-2 by  FDA under an Emergency Use Authorization (EUA). This EUA will remain  in effect (meaning this test can be used) for the duration of the  Covid-19 declaration under Section 564(b)(1) of the Act, 21  U.S.C. section 360bbb-3(b)(1), unless the authorization is  terminated or revoked. Performed at [facility], [address] [place], [place] [address]      Radiological Exams on Admission: DG Chest Portable 1 View  Result Date: 01/22/2020 CLINICAL DATA:  Shortness of breath and dizziness EXAM: PORTABLE CHEST 1 VIEW COMPARISON:  12/21/2019 FINDINGS: Cardiomegaly and interstitial  opacity that was also seen on prior. Generous lung volumes with borderline flattening of the diaphragm. There is no edema, consolidation, effusion, or pneumothorax. IMPRESSION: Interstitial prominence that could be congestive or bronchitic, appearance similar to January 2021. Electronically Signed   By: Monte Fantasia M.D.   On: 01/22/2020 11:24   DG Tibia/Fibula Left Port  Result Date: 01/22/2020 CLINICAL DATA:  Left lower leg pain with wounds and redness EXAM: PORTABLE LEFT TIBIA AND FIBULA - 2 VIEW COMPARISON:  None. FINDINGS: Nonspecific subcutaneous reticulation in the leg with undulating lateral calf skin surface. No opaque foreign body or soft tissue emphysema. Degenerative spurring at the knee and ankle which is mild. IMPRESSION: 1. Soft tissue swelling without gas or opaque foreign body. 2. No acute osseous finding. Electronically Signed   By: Monte Fantasia M.D.   On: 01/22/2020 11:27     EKG: Independently reviewed.  Atrial fibrillation with RVR, QTC 508, low voltage, nonspecific T wave change  Assessment/Plan Principal  Problem:   Atrial fibrillation with RVR (HCC) Active Problems:   Acute and chronic respiratory failure with hypoxia (HCC)   COPD exacerbation ([facility]CC)   Chronic systolic CHF (congestive heart failure) (HCC)   Essential hypertension   Pneumonia due to COVID-19 virus   Atrial fibrillation with RVR ([place]): likely triggerd by COPD exacerbation. CHA2DS2-VASc Score is 5, needs oral anticoagulation. Patient is on Xarelto at home. HR is up to 140s. No CP. Trop 6 -->4  -admit to SDU as inpt -IV cardizem gtt -continue home Cardizem -check TSH  Acute and chronic respiratory failure with hypoxia due to COPD exacerbation ([place]): -Bronchodilators -Solu-Medrol 60 mg twice daily, -As needed Mucinex for cough  Chronic systolic CHF (congestive heart failure) [facility]): Patient has trace leg edema.  BNP 159.  Does not seem to have CHF exacerbation.  No 2D echo on record. -Hold spironolactone and torsemide due to hypotension -continue Entresto --will start tomorrow  Essential hypertension: Patient had a low blood pressure initially with blood pressure 79/62, currently blood pressure is 111/68 -On Entresto which is for CHF -On Cardizem which is for A. Fib  Recent Pneumonia due to COVID-19 virus: Patient was treated with remdesivir and steroid.  Patient is on Xarelto, low suspicions of PE. -Vitamin C and zinc  Left lower leg wound: dose not seem to be infected -Wound care consult    Inpatient status:  # Patient requires inpatient status due to high intensity of service, high risk for further deterioration and high frequency of surveillance required.  I certify that at the point of admission it is my clinical judgment that the patient will require inpatient hospital care spanning beyond 2 midnights from the point of admission.  . This patient has multiple chronic comorbidities including hypertension, COPD, PVD, atrial fibrillation on Xarelto, CHF, tobacco abuse, OSA, recently treated COVID-19  infection, . Now patient has presenting with A fib with RVR, acute and chronic respiratory failure with hypoxia and COPD exacerbation . The worrisome physical exam findings include wheezing on auscultation bilaterally, chronic wound on left lower leg . The initial radiographic and laboratory data are worrisome because of chest x-ray showed interstitial prominence . Current medical needs: please see my assessment and plan . Predictability of an adverse outcome (risk): Patient has multiple comorbidities as listed above. Now presents with a fib with RVR, acute and chronic respiratory failure with hypoxia and COPD exacerbation. Patient's presentation is highly complicated.  Patient is at high risk of deteriorating.  Will need to be treated  in hospital for at least 2 days.             DVT ppx: on Xarelto Code Status: Full code Family Communication: None at bed side.   Disposition Plan:  Anticipate discharge back to previous home environment Consults called: None Admission status: SDU/inpation       Date of Service 01/22/2020    [facility]   If 7PM-7AM, please contact night-coverage [email or web address] 01/22/2020, 5:48 PM

## 2020-01-22 NOTE — ED Notes (Signed)
Attempted to call report x 1  

## 2020-01-22 NOTE — ED Notes (Signed)
Patient has wound present on left calf. Three different wounds. Wound rewrapped with xeroform and curlex

## 2020-01-22 NOTE — ED Triage Notes (Signed)
Pt arrived by EMS from home. C/o sob and dizziness for a few days. HX A-fib. Patient reports only taking medicine when he feels that he needs it. Family at home reports patients wife died in May 06, 2019 and he has been somewhat depressed, not taking meds compliantly, and failure to thrive. Pt A&O x4 upon arrival. Able to ambulate from EMS stretcher to ER stretcher

## 2020-01-23 DIAGNOSIS — I4891 Unspecified atrial fibrillation: Secondary | ICD-10-CM

## 2020-01-23 LAB — CBC
HCT: 48.3 % (ref 39.0–52.0)
Hemoglobin: 15.7 g/dL (ref 13.0–17.0)
MCH: 30.4 pg (ref 26.0–34.0)
MCHC: 32.5 g/dL (ref 30.0–36.0)
MCV: 93.4 fL (ref 80.0–100.0)
Platelets: 227 10*3/uL (ref 150–400)
RBC: 5.17 MIL/uL (ref 4.22–5.81)
RDW: 15.7 % — ABNORMAL HIGH (ref 11.5–15.5)
WBC: 7.9 10*3/uL (ref 4.0–10.5)
nRBC: 0 % (ref 0.0–0.2)

## 2020-01-23 LAB — BASIC METABOLIC PANEL
Anion gap: 9 (ref 5–15)
BUN: 19 mg/dL (ref 8–23)
CO2: 28 mmol/L (ref 22–32)
Calcium: 8.7 mg/dL — ABNORMAL LOW (ref 8.9–10.3)
Chloride: 103 mmol/L (ref 98–111)
Creatinine, Ser: 1.34 mg/dL — ABNORMAL HIGH (ref 0.61–1.24)
GFR calc Af Amer: 59 mL/min — ABNORMAL LOW (ref >60)
GFR calc non Af Amer: 51 mL/min — ABNORMAL LOW (ref >60)
Glucose, Bld: 215 mg/dL — ABNORMAL HIGH (ref 70–99)
Potassium: 3.6 mmol/L (ref 3.5–5.1)
Sodium: 140 mmol/L (ref 135–145)

## 2020-01-23 LAB — TSH: TSH: 0.41 u[IU]/mL (ref 0.350–4.500)

## 2020-01-23 LAB — LACTIC ACID, PLASMA: Lactic Acid, Venous: 3.2 mmol/L (ref 0.5–1.9)

## 2020-01-23 MED ORDER — RIVAROXABAN 20 MG PO TABS
20.0000 mg | ORAL_TABLET | Freq: Every day | ORAL | Status: DC
  Administered 2020-01-23 – 2020-01-27 (×5): 20 mg via ORAL
  Filled 2020-01-23 (×6): qty 1

## 2020-01-23 MED ORDER — METHYLPREDNISOLONE SODIUM SUCC 40 MG IJ SOLR
40.0000 mg | Freq: Every day | INTRAMUSCULAR | Status: DC
  Administered 2020-01-24 – 2020-01-28 (×5): 40 mg via INTRAVENOUS
  Filled 2020-01-23 (×6): qty 1

## 2020-01-23 NOTE — Progress Notes (Signed)
PROGRESS NOTE    Stanley Marks  [id number] DOB: Jul 09, 1941 DOA: 01/22/2020 PCP: Perrin Maltese, MD   Brief Narrative:  Stanley Marks is a 79 y.o. male with medical history significant of hypertension, COPD, PVD, atrial fibrillation on Xarelto, CHF, tobacco abuse, OSA, recently treated COVID-19 infection, who presents with generalized weakness, fatigue, cough, shortness breath, dizziness.  He has left lower extremity wound which has been going on for more than two weeks and is more painful recently. He is getting treatment in wound clinic. Of note, patient was recently hospitalized from 1/12-1/16 due to COVID-19 infection.  Patient was treated with remdesivir and steroid.  Patient was found to have atrial fibrillation with RVR in ED, with heart rates up to 140s.  Cardizem drip is started in ED.  Subjective: Patient was feeling better when seen this morning.  He was having some left lower leg pain, stating that he is being followed up at the wound care clinic.  Denies any chest pain or shortness of breath.  Assessment & Plan:   Principal Problem:   Atrial fibrillation with RVR (HCC) Active Problems:   Acute and chronic respiratory failure with hypoxia (HCC)   COPD exacerbation (HCC)   Chronic systolic CHF (congestive heart failure) (HCC)   Essential hypertension   Pneumonia due to COVID-19 virus  Atrial fibrillation with RVR ([place]): likely triggerd by COPD exacerbation. CHA2DS2-VASc Score is 5, patient is on Xarelto at home. Patient continued to have heart rate in low 100s when seen this morning. TSH within normal limit. -Continue Cardizem infusion till heart rate normalized. -Continue home dose of Cardizem 60 mg twice daily. -Continue Xarelto.  Acute and chronic respiratory failure with hypoxia due to COPD exacerbation ([place]): There was no wheezing today. -Continue bronchodilators. -Decrease Solu-Medrol to 40 mg daily. -Continue as needed Mucinex for cough.  Chronic systolic CHF  (congestive heart failure) [facility]): Patient has trace leg edema.  BNP 159.  Does not seem to have CHF exacerbation.  No 2D echo on record.  Appears euvolemic. -Holding home dose of spironolactone and torsemide due to softer blood pressure. -Continue Entresto.  Hypertension.  Initially softer blood pressure, currently within goal. -Continue home dose of Entresto and Cardizem. -Keep holding spironolactone and torsemide. -Keep monitoring.  Recent Pneumonia due to COVID-19 virus: Patient was treated with remdesivir and steroid.  Patient is on Xarelto, low suspicions of PE. -Continue Vitamin C and zinc  Left lower leg wound: dose not seem to be infected -Wound care consult.  Objective: Vitals:   01/23/20 0900 01/23/20 1000 01/23/20 1100 01/23/20 1300  BP: (!) 91/55 115/75 107/71   Pulse: (!) 120 86 (!) 58 81  Resp: (!) 26 20 (!) 32 18  Temp:      TempSrc:      SpO2: 93% 97% 94% 96%  Weight:      Height:        Intake/Output Summary (Last 24 hours) at 01/23/2020 1425 Last data filed at 01/23/2020 0900 Gross per 24 hour  Intake 424.97 ml  Output 175 ml  Net 249.97 ml   Filed Weights   01/22/20 1031 01/22/20 2110  Weight: 131.5 kg 131.1 kg    Examination:  General exam: Appears calm and comfortable  Respiratory system: Clear to auscultation. Respiratory effort normal. Cardiovascular system: Irregularly irregular, no JVD, murmurs, rubs, gallops or clicks. Gastrointestinal system: Soft, nontender, nondistended, bowel sounds positive. Central nervous system: Alert and oriented. No focal neurological deficits.Symmetric 5 x 5 power. Extremities: Trace edema, no  cyanosis, pulses intact and symmetrical.  Left lower extremity with clean bandage.  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 Psychiatry: Judgement and insight appear normal. Mood & affect appropriate.    DVT prophylaxis: Xarelto Code Status: Full Family Communication: No family at bedside. Disposition Plan:  Pending improvement, will go back home.  Consultants:   None  Procedures:  Antimicrobials:   Data Reviewed: I have personally reviewed following labs and imaging studies  CBC: Recent Labs  Lab 01/22/20 1048 01/23/20 0626  WBC 8.0 7.9  NEUTROABS 6.0  --   HGB 16.0 15.7  HCT 50.0 48.3  MCV 94.7 93.4  PLT 231 [id number]   Basic Metabolic Panel: Recent Labs  Lab 01/22/20 1048 01/23/20 0626  NA 145 140  K 3.5 3.6  CL 98 103  CO2 29 28  GLUCOSE 114* 215*  BUN 13 19  CREATININE 1.47* 1.34*  CALCIUM 9.4 8.7*   GFR: Estimated Creatinine Clearance: 67.5 mL/min (A) (by C-G formula based on SCr of 1.34 mg/dL (H)). Liver Function Tests: Recent Labs  Lab 01/22/20 1048  AST 18  ALT 20  ALKPHOS 67  BILITOT 1.2  PROT 6.8  ALBUMIN 3.3*   No results for input(s): LIPASE, AMYLASE in the last 168 hours. No results for input(s): AMMONIA in the last 168 hours. Coagulation Profile: No results for input(s): INR, PROTIME in the last 168 hours. Cardiac Enzymes: No results for input(s): CKTOTAL, CKMB, CKMBINDEX, TROPONINI in the last 168 hours. BNP (last 3 results) No results for input(s): PROBNP in the last 8760 hours. HbA1C: No results for input(s): HGBA1C in the last 72 hours. CBG: No results for input(s): GLUCAP in the last 168 hours. Lipid Profile: No results for input(s): CHOL, HDL, LDLCALC, TRIG, CHOLHDL, LDLDIRECT in the last 72 hours. Thyroid Function Tests: Recent Labs    01/23/20 0626  TSH 0.410   Anemia Panel: No results for input(s): VITAMINB12, FOLATE, FERRITIN, TIBC, IRON, RETICCTPCT in the last 72 hours. Sepsis Labs: Recent Labs  Lab 01/22/20 1103 01/22/20 1419  LATICACIDVEN 1.4 2.2*    Recent Results (from the past 240 hour(s))  Respiratory Panel by RT PCR (Flu A&B, Covid) - Nasopharyngeal Swab     Status: Abnormal   Collection Time: 01/22/20  2:52 PM   Specimen: Nasopharyngeal Swab  Result Value Ref Range Status   SARS Coronavirus 2 by RT PCR  POSITIVE (A) NEGATIVE Final    Comment: RESULT CALLED TO, READ BACK BY AND VERIFIED WITH: CHRISSY BRAND @1546  01/22/20 MJU (NOTE) SARS-CoV-2 target nucleic acids are DETECTED. SARS-CoV-2 RNA is generally detectable in upper respiratory specimens  during the acute phase of infection. Positive results are indicative of the presence of the identified virus, but do not rule out bacterial infection or co-infection with other pathogens not detected by the test. Clinical correlation with patient history and other diagnostic information is necessary to determine patient infection status. The expected result is Negative. Fact Sheet for Patients:  PinkCheek.be Fact Sheet for Healthcare Providers: GravelBags.it This test is not yet approved or cleared by the [place] FDA and  has been authorized for detection and/or diagnosis of SARS-CoV-2 by FDA under an Emergency Use Authorization (EUA).  This EUA will remain in effect (meaning this test can be used) for t he duration of  the COVID-19 declaration under Section 564(b)(1) of the Act, 21 U.S.C. section 360bbb-3(b)(1), unless the authorization is terminated or revoked sooner.    Influenza A by PCR NEGATIVE NEGATIVE Final   Influenza  B by PCR NEGATIVE NEGATIVE Final    Comment: (NOTE) The Xpert Xpress SARS-CoV-2/FLU/RSV assay is intended as an aid in  the diagnosis of influenza from Nasopharyngeal swab specimens and  should not be used as a sole basis for treatment. Nasal washings and  aspirates are unacceptable for Xpert Xpress SARS-CoV-2/FLU/RSV  testing. Fact Sheet for Patients: PinkCheek.be Fact Sheet for Healthcare Providers: GravelBags.it This test is not yet approved or cleared by the [place] FDA and  has been authorized for detection and/or diagnosis of SARS-CoV-2 by  FDA under an Emergency Use Authorization  (EUA). This EUA will remain  in effect (meaning this test can be used) for the duration of the  Covid-19 declaration under Section 564(b)(1) of the Act, 21  U.S.C. section 360bbb-3(b)(1), unless the authorization is  terminated or revoked. Performed at [facility], [address] [place], [place] [address]   MRSA PCR Screening     Status: None   Collection Time: 01/22/20  9:17 PM   Specimen: Nasopharyngeal  Result Value Ref Range Status   MRSA by PCR NEGATIVE NEGATIVE Final    Comment:        The GeneXpert MRSA Assay (FDA approved for NASAL specimens only), is one component of a comprehensive MRSA colonization surveillance program. It is not intended to diagnose MRSA infection nor to guide or monitor treatment for MRSA infections. Performed at [facility], [address] [place],  [address]      Radiology Studies: DG Chest Portable 1 View  Result Date: 01/22/2020 CLINICAL DATA:  Shortness of breath and dizziness EXAM: PORTABLE CHEST 1 VIEW COMPARISON:  12/21/2019 FINDINGS: Cardiomegaly and interstitial opacity that was also seen on prior. Generous lung volumes with borderline flattening of the diaphragm. There is no edema, consolidation, effusion, or pneumothorax. IMPRESSION: Interstitial prominence that could be congestive or bronchitic, appearance similar to January 2021. Electronically Signed   By: Monte Fantasia M.D.   On: 01/22/2020 11:24   DG Tibia/Fibula Left Port  Result Date: 01/22/2020 CLINICAL DATA:  Left lower leg pain with wounds and redness EXAM: PORTABLE LEFT TIBIA AND FIBULA - 2 VIEW COMPARISON:  None. FINDINGS: Nonspecific subcutaneous reticulation in the leg with undulating lateral calf skin surface. No opaque foreign body or soft tissue emphysema. Degenerative spurring at the knee and ankle which is mild. IMPRESSION: 1. Soft tissue swelling without gas or opaque foreign body. 2. No acute osseous finding. Electronically Signed   By:  Monte Fantasia M.D.   On: 01/22/2020 11:27    Scheduled Meds: . vitamin C  500 mg Oral Daily  . Chlorhexidine Gluconate Cloth  6 each Topical Q0600  . dextromethorphan-guaiFENesin  1 tablet Oral BID  . diltiazem  60 mg Oral Q12H  . ipratropium  2 puff Inhalation Q4H  . methylPREDNISolone (SOLU-MEDROL) injection  60 mg Intravenous Q12H  . multivitamin with minerals  1 tablet Oral Daily  . nicotine  21 mg Transdermal Daily  . Rivaroxaban  20 mg Oral Q supper  . sacubitril-valsartan  1 tablet Oral BID  . zinc sulfate  220 mg Oral Daily   Continuous Infusions: . diltiazem (CARDIZEM) infusion 15 mg/hr (01/23/20 1035)     LOS: 1 day   Time spent: 40 minutes.  Lorella Nimrod, MD [facility]  If 7PM-7AM, please contact night-coverage [email or web address]  01/23/2020, 2:25 PM   This record has been created using Systems analyst. Errors have been sought and corrected,but may not always be located. Such  creation errors do not reflect on the standard of care.

## 2020-01-23 NOTE — Consult Note (Signed)
[facility] Nurse Consult Note: Patient receiving care in [place].  Patient is COVID +.  Consult completed remotely after review of record, including Wound Center notes. Reason for Consult: left leg wound Wound type: per record from wound center, venous insufficiency for which the patient does not tolerate compression. Pressure Injury POA: Yes/No/NA Measurement: To be provided by the bedside RN in the flowsheet section Wound bed: Drainage (amount, consistency, odor)  Periwound: Dressing procedure/placement/frequency:  Wash left leg with soap and water. Apply Aquacel Kellie Simmering [phone]) over the wounds, then kerlex, then 4 inch Ace Wrap. Monitor the wound area(s) for worsening of condition such as: Signs/symptoms of infection,  Increase in size,  Development of or worsening of odor, Development of pain, or increased pain at the affected locations.  Notify the medical team if any of these develop.  Thank you for the consult. [place] nurse will not follow at this time.  Please re-consult the [facility] team if needed.  Val Riles, RN, MSN, CWOCN, CNS-BC, pager [phone]

## 2020-01-24 LAB — LACTIC ACID, PLASMA
Lactic Acid, Venous: 2.2 mmol/L (ref 0.5–1.9)
Lactic Acid, Venous: 1.9 mmol/L (ref 0.5–1.9)
Lactic Acid, Venous: 3 mmol/L (ref 0.5–1.9)

## 2020-01-24 LAB — BASIC METABOLIC PANEL
Anion gap: 10 (ref 5–15)
BUN: 24 mg/dL — ABNORMAL HIGH (ref 8–23)
CO2: 27 mmol/L (ref 22–32)
Calcium: 9 mg/dL (ref 8.9–10.3)
Chloride: 102 mmol/L (ref 98–111)
Creatinine, Ser: 1.1 mg/dL (ref 0.61–1.24)
GFR calc Af Amer: >60 mL/min (ref >60)
GFR calc non Af Amer: >60 mL/min (ref >60)
Glucose, Bld: 163 mg/dL — ABNORMAL HIGH (ref 70–99)
Potassium: 3.9 mmol/L (ref 3.5–5.1)
Sodium: 139 mmol/L (ref 135–145)

## 2020-01-24 LAB — URINALYSIS, COMPLETE (UACMP) WITH MICROSCOPIC
Bacteria, UA: NONE SEEN
Bilirubin Urine: NEGATIVE
Glucose, UA: NEGATIVE mg/dL
Hgb urine dipstick: NEGATIVE
Ketones, ur: NEGATIVE mg/dL
Leukocytes,Ua: NEGATIVE
Nitrite: NEGATIVE
Protein, ur: NEGATIVE mg/dL
Specific Gravity, Urine: 1.008 (ref 1.005–1.030)
Squamous Epithelial / HPF: NONE SEEN (ref 0–5)
pH: 5 (ref 5.0–8.0)

## 2020-01-24 LAB — PROCALCITONIN: Procalcitonin: <0.1 ng/mL

## 2020-01-24 LAB — CBC
HCT: 47.4 % (ref 39.0–52.0)
Hemoglobin: 15.6 g/dL (ref 13.0–17.0)
MCH: 30.8 pg (ref 26.0–34.0)
MCHC: 32.9 g/dL (ref 30.0–36.0)
MCV: 93.5 fL (ref 80.0–100.0)
Platelets: 263 10*3/uL (ref 150–400)
RBC: 5.07 MIL/uL (ref 4.22–5.81)
RDW: 15.8 % — ABNORMAL HIGH (ref 11.5–15.5)
WBC: 19.2 10*3/uL — ABNORMAL HIGH (ref 4.0–10.5)
nRBC: 0 % (ref 0.0–0.2)

## 2020-01-24 MED ORDER — AMIODARONE HCL IN DEXTROSE 360-4.14 MG/200ML-% IV SOLN
30.0000 mg/h | INTRAVENOUS | Status: DC
  Administered 2020-01-24 – 2020-01-27 (×7): 30 mg/h via INTRAVENOUS
  Filled 2020-01-24 (×5): qty 200

## 2020-01-24 MED ORDER — AMIODARONE LOAD VIA INFUSION
150.0000 mg | Freq: Once | INTRAVENOUS | Status: AC
  Administered 2020-01-24: 150 mg via INTRAVENOUS
  Filled 2020-01-24: qty 83.34

## 2020-01-24 MED ORDER — AMIODARONE HCL IN DEXTROSE 360-4.14 MG/200ML-% IV SOLN
60.0000 mg/h | INTRAVENOUS | Status: AC
  Administered 2020-01-24 (×2): 60 mg/h via INTRAVENOUS
  Filled 2020-01-24 (×3): qty 200

## 2020-01-24 NOTE — Progress Notes (Signed)
PROGRESS NOTE    Stanley Marks  [id number] DOB: [birth year] DOA: 01/22/2020 PCP: Perrin Maltese, MD   Brief Narrative:  Stanley Marks is a 79 y.o. male with medical history significant of hypertension, COPD, PVD, atrial fibrillation on Xarelto, CHF, tobacco abuse, OSA, recently treated COVID-19 infection, who presents with generalized weakness, fatigue, cough, shortness breath, dizziness.  He has left lower extremity wound which has been going on for more than two weeks and is more painful recently. He is getting treatment in wound clinic. Of note, patient was recently hospitalized from 1/12-1/16 due to COVID-19 infection.  Patient was treated with remdesivir and steroid.  Patient was found to have atrial fibrillation with RVR in ED, with heart rates up to 140s.  Cardizem drip is started in ED.  Subjective: Patient denies any chest pain or shortness of breath.  Discussed with him that his heart rate remained high and we would like to change his medications from Cardizem to amiodarone.  He wants me to involve his cardiologist.  Assessment & Plan:   Principal Problem:   Atrial fibrillation with RVR ([place]) Active Problems:   Acute and chronic respiratory failure with hypoxia (HCC)   COPD exacerbation (HCC)   Chronic systolic CHF (congestive heart failure) (HCC)   Essential hypertension   Pneumonia due to COVID-19 virus  Atrial fibrillation with RVR ([place]): likely triggerd by COPD exacerbation. CHA2DS2-VASc Score is 5, patient is on Xarelto at home. Patient continued to have heart rate in low 100s when seen this morning. TSH within normal limit. -Switch Cardizem with amiodarone infusion after discussing with Dr. Humphrey Rolls his cardiologist as patient's blood pressure was little soft and there is not much room to go up on Cardizem infusion. -Continue home dose of Cardizem 60 mg twice daily. -Continue Xarelto.  Hypothermia/leukocytosis/Lactic acidosis.  Hypothermia with worsening leukocytosis and  lactic acidosis today.  Patient is on steroids which can partly explain leukocytosis.  Has left lower extremity wound which apparently does not appear infected. Most likely secondary to decreased perfusion due to A. fib with RVR. -Get UA, urine and blood cultures. -UA within normal limit-cultures pending. -Monitor Lactic acid.3.2>>2.2 -Check procalcitonin- <0.10  Acute and chronic respiratory failure with hypoxia due to COPD exacerbation ([place]): There was no wheezing today. -Continue bronchodilators. -Decrease Solu-Medrol to 40 mg daily. -Continue as needed Mucinex for cough.  Chronic systolic CHF (congestive heart failure) [facility]): Patient has trace leg edema.  BNP 159.  Does not seem to have CHF exacerbation.  No 2D echo on record.  Appears euvolemic. -Holding home dose of spironolactone and torsemide due to softer blood pressure. -Continue Entresto.  Hypertension.  Mildly softer blood pressure,  -Continue home dose of Entresto and Cardizem. -Switching Cardizem infusion with amiodarone. -Keep holding spironolactone and torsemide. -Keep monitoring.  Recent Pneumonia due to COVID-19 virus: Patient was treated with remdesivir and steroid.  Patient is on Xarelto, low suspicions of PE. -Continue Vitamin C and zinc  Left lower leg wound: does not seem to be infected. -Wound care consult.  Objective: Vitals:   01/24/20 0700 01/24/20 0800 01/24/20 0815 01/24/20 0830  BP:   104/67 108/64  Pulse: 61 75 77 77  Resp: 13 15 14 20   Temp:  (!) 96.8 F (36 C)    TempSrc:  Oral    SpO2: 95% 96% 96% 94%  Weight:      Height:        Intake/Output Summary (Last 24 hours) at 01/24/2020 0912 Last data filed at 01/24/2020  0800 Gross per 24 hour  Intake 540.47 ml  Output 1275 ml  Net -734.53 ml   Filed Weights   01/22/20 1031 01/22/20 2110 01/24/20 0500  Weight: 131.5 kg 131.1 kg 134.4 kg    Examination:  General exam: Appears calm and comfortable  Respiratory system: Clear to  auscultation. Respiratory effort normal. Cardiovascular system: Irregularly irregular, no JVD, murmurs, rubs, gallops or clicks. Gastrointestinal system: Soft, nontender, nondistended, bowel sounds positive. Central nervous system: Alert and oriented. No focal neurological deficits.Symmetric 5 x 5 power. Extremities: Trace edema, no cyanosis, pulses intact and symmetrical.  Left lower extremity with clean bandage.  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 Psychiatry: Judgement and insight appear normal.    DVT prophylaxis: Xarelto Code Status: Full Family Communication: No family at bedside. Disposition Plan: Pending improvement, will go back home.  Consultants:   Cardiology  Procedures:  Antimicrobials:   Data Reviewed: I have personally reviewed following labs and imaging studies  CBC: Recent Labs  Lab 01/22/20 1048 01/23/20 0626 01/24/20 0553  WBC 8.0 7.9 19.2*  NEUTROABS 6.0  --   --   HGB 16.0 15.7 15.6  HCT 50.0 48.3 47.4  MCV 94.7 93.4 93.5  PLT 231 227 [id number]   Basic Metabolic Panel: Recent Labs  Lab 01/22/20 1048 01/23/20 0626 01/24/20 0553  NA 145 140 139  K 3.5 3.6 3.9  CL 98 103 102  CO2 29 28 27   GLUCOSE 114* 215* 163*  BUN 13 19 24*  CREATININE 1.47* 1.34* 1.10  CALCIUM 9.4 8.7* 9.0   GFR: Estimated Creatinine Clearance: 83.3 mL/min (by C-G formula based on SCr of 1.1 mg/dL). Liver Function Tests: Recent Labs  Lab 01/22/20 1048  AST 18  ALT 20  ALKPHOS 67  BILITOT 1.2  PROT 6.8  ALBUMIN 3.3*   No results for input(s): LIPASE, AMYLASE in the last 168 hours. No results for input(s): AMMONIA in the last 168 hours. Coagulation Profile: No results for input(s): INR, PROTIME in the last 168 hours. Cardiac Enzymes: No results for input(s): CKTOTAL, CKMB, CKMBINDEX, TROPONINI in the last 168 hours. BNP (last 3 results) No results for input(s): PROBNP in the last 8760 hours. HbA1C: No results for input(s): HGBA1C in the last 72  hours. CBG: No results for input(s): GLUCAP in the last 168 hours. Lipid Profile: No results for input(s): CHOL, HDL, LDLCALC, TRIG, CHOLHDL, LDLDIRECT in the last 72 hours. Thyroid Function Tests: Recent Labs    01/23/20 0626  TSH 0.410   Anemia Panel: No results for input(s): VITAMINB12, FOLATE, FERRITIN, TIBC, IRON, RETICCTPCT in the last 72 hours. Sepsis Labs: Recent Labs  Lab 01/22/20 1103 01/22/20 1419 01/23/20 1640  LATICACIDVEN 1.4 2.2* 3.2*    Recent Results (from the past 240 hour(s))  Respiratory Panel by RT PCR (Flu A&B, Covid) - Nasopharyngeal Swab     Status: Abnormal   Collection Time: 01/22/20  2:52 PM   Specimen: Nasopharyngeal Swab  Result Value Ref Range Status   SARS Coronavirus 2 by RT PCR POSITIVE (A) NEGATIVE Final    Comment: RESULT CALLED TO, READ BACK BY AND VERIFIED WITH: CHRISSY BRAND @1546  01/22/20 MJU (NOTE) SARS-CoV-2 target nucleic acids are DETECTED. SARS-CoV-2 RNA is generally detectable in upper respiratory specimens  during the acute phase of infection. Positive results are indicative of the presence of the identified virus, but do not rule out bacterial infection or co-infection with other pathogens not detected by the test. Clinical correlation with patient history and other  diagnostic information is necessary to determine patient infection status. The expected result is Negative. Fact Sheet for Patients:  PinkCheek.be Fact Sheet for Healthcare Providers: GravelBags.it This test is not yet approved or cleared by the [place] FDA and  has been authorized for detection and/or diagnosis of SARS-CoV-2 by FDA under an Emergency Use Authorization (EUA).  This EUA will remain in effect (meaning this test can be used) for t he duration of  the COVID-19 declaration under Section 564(b)(1) of the Act, 21 U.S.C. section 360bbb-3(b)(1), unless the authorization is terminated or  revoked sooner.    Influenza A by PCR NEGATIVE NEGATIVE Final   Influenza B by PCR NEGATIVE NEGATIVE Final    Comment: (NOTE) The Xpert Xpress SARS-CoV-2/FLU/RSV assay is intended as an aid in  the diagnosis of influenza from Nasopharyngeal swab specimens and  should not be used as a sole basis for treatment. Nasal washings and  aspirates are unacceptable for Xpert Xpress SARS-CoV-2/FLU/RSV  testing. Fact Sheet for Patients: PinkCheek.be Fact Sheet for Healthcare Providers: GravelBags.it This test is not yet approved or cleared by the [place] FDA and  has been authorized for detection and/or diagnosis of SARS-CoV-2 by  FDA under an Emergency Use Authorization (EUA). This EUA will remain  in effect (meaning this test can be used) for the duration of the  Covid-19 declaration under Section 564(b)(1) of the Act, 21  U.S.C. section 360bbb-3(b)(1), unless the authorization is  terminated or revoked. Performed at [facility], [address] [place], [place] [address]   MRSA PCR Screening     Status: None   Collection Time: 01/22/20  9:17 PM   Specimen: Nasopharyngeal  Result Value Ref Range Status   MRSA by PCR NEGATIVE NEGATIVE Final    Comment:        The GeneXpert MRSA Assay (FDA approved for NASAL specimens only), is one component of a comprehensive MRSA colonization surveillance program. It is not intended to diagnose MRSA infection nor to guide or monitor treatment for MRSA infections. Performed at [facility], [address], [place], [place] [address]      Radiology Studies: DG Chest Portable 1 View  Result Date: 01/22/2020 CLINICAL DATA:  Shortness of breath and dizziness EXAM: PORTABLE CHEST 1 VIEW COMPARISON:  12/21/2019 FINDINGS: Cardiomegaly and interstitial opacity that was also seen on prior. Generous lung volumes with borderline flattening of the diaphragm. There is no edema,  consolidation, effusion, or pneumothorax. IMPRESSION: Interstitial prominence that could be congestive or bronchitic, appearance similar to January 2021. Electronically Signed   By: Monte Fantasia M.D.   On: 01/22/2020 11:24   DG Tibia/Fibula Left Port  Result Date: 01/22/2020 CLINICAL DATA:  Left lower leg pain with wounds and redness EXAM: PORTABLE LEFT TIBIA AND FIBULA - 2 VIEW COMPARISON:  None. FINDINGS: Nonspecific subcutaneous reticulation in the leg with undulating lateral calf skin surface. No opaque foreign body or soft tissue emphysema. Degenerative spurring at the knee and ankle which is mild. IMPRESSION: 1. Soft tissue swelling without gas or opaque foreign body. 2. No acute osseous finding. Electronically Signed   By: Monte Fantasia M.D.   On: 01/22/2020 11:27    Scheduled Meds: . vitamin C  500 mg Oral Daily  . Chlorhexidine Gluconate Cloth  6 each Topical Q0600  . dextromethorphan-guaiFENesin  1 tablet Oral BID  . diltiazem  60 mg Oral Q12H  . ipratropium  2 puff Inhalation Q4H  . methylPREDNISolone (SOLU-MEDROL) injection  40 mg Intravenous  Daily  . multivitamin with minerals  1 tablet Oral Daily  . nicotine  21 mg Transdermal Daily  . Rivaroxaban  20 mg Oral Q supper  . sacubitril-valsartan  1 tablet Oral BID  . zinc sulfate  220 mg Oral Daily   Continuous Infusions: . diltiazem (CARDIZEM) infusion 10 mg/hr (01/24/20 0600)     LOS: 2 days   Time spent: 45 minutes.  Lorella Nimrod, MD [facility]  If 7PM-7AM, please contact night-coverage [email or web address]  01/24/2020, 9:12 AM   This record has been created using Systems analyst. Errors have been sought and corrected,but may not always be located. Such creation errors do not reflect on the standard of care.

## 2020-01-25 ENCOUNTER — Inpatient Hospital Stay
Admit: 2020-01-25 | Discharge: 2020-01-25 | Disposition: A | Discharge status: Home / Self Care | Payer: Medicare Other | Attending: Nurse Practitioner | Admitting: Nurse Practitioner

## 2020-01-25 ENCOUNTER — Inpatient Hospital Stay: Admit: 2020-01-25 | Payer: Medicare Other

## 2020-01-25 LAB — URINE CULTURE: Culture: NO GROWTH

## 2020-01-25 LAB — CBC
HCT: 47.8 % (ref 39.0–52.0)
Hemoglobin: 15.5 g/dL (ref 13.0–17.0)
MCH: 30.5 pg (ref 26.0–34.0)
MCHC: 32.4 g/dL (ref 30.0–36.0)
MCV: 94.1 fL (ref 80.0–100.0)
Platelets: 275 10*3/uL (ref 150–400)
RBC: 5.08 MIL/uL (ref 4.22–5.81)
RDW: 15.7 % — ABNORMAL HIGH (ref 11.5–15.5)
WBC: 16.2 10*3/uL — ABNORMAL HIGH (ref 4.0–10.5)
nRBC: 0 % (ref 0.0–0.2)

## 2020-01-25 LAB — BASIC METABOLIC PANEL
Anion gap: 4 — ABNORMAL LOW (ref 5–15)
BUN: 26 mg/dL — ABNORMAL HIGH (ref 8–23)
CO2: 31 mmol/L (ref 22–32)
Calcium: 8.8 mg/dL — ABNORMAL LOW (ref 8.9–10.3)
Chloride: 103 mmol/L (ref 98–111)
Creatinine, Ser: 1.13 mg/dL (ref 0.61–1.24)
GFR calc Af Amer: >60 mL/min (ref >60)
GFR calc non Af Amer: >60 mL/min (ref >60)
Glucose, Bld: 175 mg/dL — ABNORMAL HIGH (ref 70–99)
Potassium: 4.2 mmol/L (ref 3.5–5.1)
Sodium: 138 mmol/L (ref 135–145)

## 2020-01-25 LAB — PROCALCITONIN: Procalcitonin: <0.1 ng/mL

## 2020-01-25 LAB — LACTIC ACID, PLASMA
Lactic Acid, Venous: 2.4 mmol/L (ref 0.5–1.9)
Lactic Acid, Venous: 2.8 mmol/L (ref 0.5–1.9)

## 2020-01-25 LAB — ECHOCARDIOGRAM COMPLETE
Height: 74 in
Weight: 4807.79 oz

## 2020-01-25 LAB — GLUCOSE, CAPILLARY: Glucose-Capillary: 158 mg/dL — ABNORMAL HIGH (ref 70–99)

## 2020-01-25 MED ORDER — DIGOXIN 0.25 MG/ML IJ SOLN
0.2500 mg | Freq: Once | INTRAMUSCULAR | Status: AC
  Administered 2020-01-25: 0.25 mg via INTRAVENOUS
  Filled 2020-01-25: qty 2

## 2020-01-25 MED ORDER — DIGOXIN 0.25 MG/ML IJ SOLN
0.2500 mg | Freq: Every day | INTRAMUSCULAR | Status: DC
  Administered 2020-01-25 – 2020-01-27 (×3): 0.25 mg via INTRAVENOUS
  Filled 2020-01-25 (×4): qty 2

## 2020-01-25 NOTE — Progress Notes (Signed)
*  PRELIMINARY RESULTS* Echocardiogram 2D Echocardiogram has been performed.  Stanley Marks 01/25/2020, 1:32 PM

## 2020-01-25 NOTE — Progress Notes (Signed)
PROGRESS NOTE    Stanley Marks  [id number] DOB: 05-Nov-1941 DOA: 01/22/2020 PCP: Perrin Maltese, MD   Brief Narrative:  Stanley Marks is a 79 y.o. male with medical history significant of hypertension, COPD, PVD, atrial fibrillation on Xarelto, CHF, tobacco abuse, OSA, recently treated COVID-19 infection, who presents with generalized weakness, fatigue, cough, shortness breath, dizziness.  He has left lower extremity wound which has been going on for more than two weeks and is more painful recently. He is getting treatment in wound clinic. Of note, patient was recently hospitalized from 1/12-1/16 due to COVID-19 infection.  Patient was treated with remdesivir and steroid.  Patient was found to have atrial fibrillation with RVR in ED, with heart rates up to 140s.  Cardizem drip is started in ED.  Subjective: Patient was feeling better when seen this morning.  Continues to have borderline heart rate around 100.  Denies any chest pain or shortness of breath.  Was feeling little lethargic.  Assessment & Plan:   Principal Problem:   Atrial fibrillation with RVR (HCC) Active Problems:   Acute and chronic respiratory failure with hypoxia (HCC)   COPD exacerbation (HCC)   Chronic systolic CHF (congestive heart failure) (HCC)   Essential hypertension   Pneumonia due to COVID-19 virus  Atrial fibrillation with RVR ([place]): likely triggerd by COPD exacerbation. CHA2DS2-VASc Score is 5, patient is on Xarelto at home. Patient continued to have heart rate in low 100s when seen this morning. TSH within normal limit. -Cardiology started him on digoxin. -Continue amiodarone infusion till heart rate improves. -Echocardiogram was ordered by cardiology to rule out myopericarditis due to his recent covert 19 infection-pending results. -Discontinue home dose of Cardizem 60 mg twice daily. -Continue Xarelto.  Hypothermia/leukocytosis/Lactic acidosis.  Hypothermia resolved.   Patient is on steroids which  can partly explain leukocytosis.  Has left lower extremity wound which apparently does not appear infected.  Procalcitonin remain negative.  Blood and urine culture remain negative. Again developed lactic acidosis most likely secondary to decreased perfusion due to A. fib with RVR. -Monitor Lactic acid.3.0>>  Acute and chronic respiratory failure with hypoxia due to COPD exacerbation (HCC): There was no wheezing today. -Continue bronchodilators. -Continue Solu-Medrol to 40 mg daily. -Continue as needed Mucinex for cough.  Chronic systolic CHF (congestive heart failure) [facility]): Patient has trace leg edema.  BNP 159.  Does not seem to have CHF exacerbation.  No 2D echo on record.  Appears euvolemic. -Holding home dose of spironolactone and torsemide due to softer blood pressure. -Continue Entresto. -Repeat echo done today-pending results.  Hypertension.  Mildly softer blood pressure,  -Continue home dose of Entresto . -Switching Cardizem infusion with amiodarone. -Keep holding spironolactone and torsemide. -Keep monitoring.  Recent Pneumonia due to COVID-19 virus: Patient was treated with remdesivir and steroid.  Patient is on Xarelto, low suspicions of PE. -Continue Vitamin C and zinc  Left lower leg wound: does not seem to be infected. -Wound care consult.  Objective: Vitals:   01/25/20 1000 01/25/20 1100 01/25/20 1200 01/25/20 1300  BP: (!) 119/96 93/63 (!) 90/49 (!) 115/97  Pulse: 78 69 76 79  Resp: (!) 25 15 (!) 28 19  Temp:      TempSrc:      SpO2: 97% 99% 94% 98%  Weight:      Height:        Intake/Output Summary (Last 24 hours) at 01/25/2020 1401 Last data filed at 01/25/2020 1333 Gross per 24 hour  Intake 923.9 ml  Output 1700 ml  Net -776.1 ml   Filed Weights   01/22/20 2110 01/24/20 0500 01/25/20 0500  Weight: 131.1 kg 134.4 kg (!) 136.3 kg    Examination:  General exam: Appears calm and comfortable  Respiratory system: Clear to auscultation. Respiratory  effort normal. Cardiovascular system: Irregularly irregular, no JVD, murmurs, rubs, gallops or clicks. Gastrointestinal system: Soft, nontender, nondistended, bowel sounds positive. Central nervous system: Alert and oriented. No focal neurological deficits.Symmetric 5 x 5 power. Extremities: Trace edema, no cyanosis, pulses intact and symmetrical.  Left lower extremity with clean bandage.  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 Psychiatry: Judgement and insight appear normal.    DVT prophylaxis: Xarelto Code Status: Full Family Communication: No family at bedside. Disposition Plan: Pending improvement, will go back home.  Consultants:   Cardiology  Procedures:  Antimicrobials:   Data Reviewed: I have personally reviewed following labs and imaging studies  CBC: Recent Labs  Lab 01/22/20 1048 01/23/20 0626 01/24/20 0553 01/25/20 0544  WBC 8.0 7.9 19.2* 16.2*  NEUTROABS 6.0  --   --   --   HGB 16.0 15.7 15.6 15.5  HCT 50.0 48.3 47.4 47.8  MCV 94.7 93.4 93.5 94.1  PLT 231 227 263 [id number]   Basic Metabolic Panel: Recent Labs  Lab 01/22/20 1048 01/23/20 0626 01/24/20 0553 01/25/20 0544  NA 145 140 139 138  K 3.5 3.6 3.9 4.2  CL 98 103 102 103  CO2 29 28 27 31   GLUCOSE 114* 215* 163* 175*  BUN 13 19 24* 26*  CREATININE 1.47* 1.34* 1.10 1.13  CALCIUM 9.4 8.7* 9.0 8.8*   GFR: Estimated Creatinine Clearance: 81.7 mL/min (by C-G formula based on SCr of 1.13 mg/dL). Liver Function Tests: Recent Labs  Lab 01/22/20 1048  AST 18  ALT 20  ALKPHOS 67  BILITOT 1.2  PROT 6.8  ALBUMIN 3.3*   No results for input(s): LIPASE, AMYLASE in the last 168 hours. No results for input(s): AMMONIA in the last 168 hours. Coagulation Profile: No results for input(s): INR, PROTIME in the last 168 hours. Cardiac Enzymes: No results for input(s): CKTOTAL, CKMB, CKMBINDEX, TROPONINI in the last 168 hours. BNP (last 3 results) No results for input(s): PROBNP in the last  8760 hours. HbA1C: No results for input(s): HGBA1C in the last 72 hours. CBG: Recent Labs  Lab 01/22/20 2103  GLUCAP 158*   Lipid Profile: No results for input(s): CHOL, HDL, LDLCALC, TRIG, CHOLHDL, LDLDIRECT in the last 72 hours. Thyroid Function Tests: Recent Labs    01/23/20 0626  TSH 0.410   Anemia Panel: No results for input(s): VITAMINB12, FOLATE, FERRITIN, TIBC, IRON, RETICCTPCT in the last 72 hours. Sepsis Labs: Recent Labs  Lab 01/23/20 1640 01/24/20 1254 01/24/20 1556 01/24/20 1913 01/25/20 0544  PROCALCITON  --  <0.10  --   --  <0.10  LATICACIDVEN 3.2* 2.2* 1.9 3.0*  --     Recent Results (from the past 240 hour(s))  Respiratory Panel by RT PCR (Flu A&B, Covid) - Nasopharyngeal Swab     Status: Abnormal   Collection Time: 01/22/20  2:52 PM   Specimen: Nasopharyngeal Swab  Result Value Ref Range Status   SARS Coronavirus 2 by RT PCR POSITIVE (A) NEGATIVE Final    Comment: RESULT CALLED TO, READ BACK BY AND VERIFIED WITH: CHRISSY BRAND @1546  01/22/20 MJU (NOTE) SARS-CoV-2 target nucleic acids are DETECTED. SARS-CoV-2 RNA is generally detectable in upper respiratory specimens  during the acute phase of infection. Positive results  are indicative of the presence of the identified virus, but do not rule out bacterial infection or co-infection with other pathogens not detected by the test. Clinical correlation with patient history and other diagnostic information is necessary to determine patient infection status. The expected result is Negative. Fact Sheet for Patients:  PinkCheek.be Fact Sheet for Healthcare Providers: GravelBags.it This test is not yet approved or cleared by the [place] FDA and  has been authorized for detection and/or diagnosis of SARS-CoV-2 by FDA under an Emergency Use Authorization (EUA).  This EUA will remain in effect (meaning this test can be used) for t he duration of    the COVID-19 declaration under Section 564(b)(1) of the Act, 21 U.S.C. section 360bbb-3(b)(1), unless the authorization is terminated or revoked sooner.    Influenza A by PCR NEGATIVE NEGATIVE Final   Influenza B by PCR NEGATIVE NEGATIVE Final    Comment: (NOTE) The Xpert Xpress SARS-CoV-2/FLU/RSV assay is intended as an aid in  the diagnosis of influenza from Nasopharyngeal swab specimens and  should not be used as a sole basis for treatment. Nasal washings and  aspirates are unacceptable for Xpert Xpress SARS-CoV-2/FLU/RSV  testing. Fact Sheet for Patients: PinkCheek.be Fact Sheet for Healthcare Providers: GravelBags.it This test is not yet approved or cleared by the [place] FDA and  has been authorized for detection and/or diagnosis of SARS-CoV-2 by  FDA under an Emergency Use Authorization (EUA). This EUA will remain  in effect (meaning this test can be used) for the duration of the  Covid-19 declaration under Section 564(b)(1) of the Act, 21  U.S.C. section 360bbb-3(b)(1), unless the authorization is  terminated or revoked. Performed at [facility], [address] [place], [place] [address]   MRSA PCR Screening     Status: None   Collection Time: 01/22/20  9:17 PM   Specimen: Nasopharyngeal  Result Value Ref Range Status   MRSA by PCR NEGATIVE NEGATIVE Final    Comment:        The GeneXpert MRSA Assay (FDA approved for NASAL specimens only), is one component of a comprehensive MRSA colonization surveillance program. It is not intended to diagnose MRSA infection nor to guide or monitor treatment for MRSA infections. Performed at [facility], [address] [place], [place] [address]   CULTURE, BLOOD (ROUTINE X 2) w Reflex to ID Panel     Status: None (Preliminary result)   Collection Time: 01/24/20  9:52 AM   Specimen: BLOOD  Result Value Ref Range Status   Specimen Description  BLOOD RT HAND  Final   Special Requests   Final    BOTTLES DRAWN AEROBIC AND ANAEROBIC Blood Culture adequate volume   Culture   Final    NO GROWTH < 24 HOURS Performed at [facility], [address], [place], [place] [address]    Report Status PENDING  Incomplete  Urine Culture     Status: None   Collection Time: 01/24/20 12:14 PM   Specimen: Urine, Clean Catch  Result Value Ref Range Status   Specimen Description   Final    URINE, CLEAN CATCH Performed at [facility], [address] [place], [place] [address]    Special Requests   Final    NONE Performed at [facility], [address], [place], [place] [address]    Culture   Final    NO GROWTH Performed at [facility], [place] [address], [place], [place] [address]  Report Status 01/25/2020 FINAL  Final  CULTURE, BLOOD (ROUTINE X 2) w Reflex to ID Panel     Status: None (Preliminary result)   Collection Time: 01/24/20 12:47 PM   Specimen: BLOOD  Result Value Ref Range Status   Specimen Description BLOOD LEFT ANTECUBITAL  Final   Special Requests   Final    BOTTLES DRAWN AEROBIC AND ANAEROBIC Blood Culture adequate volume   Culture   Final    NO GROWTH < 24 HOURS Performed at [facility], [address] [place], [place] [address]    Report Status PENDING  Incomplete     Radiology Studies: No results found.  Scheduled Meds: . vitamin C  500 mg Oral Daily  . Chlorhexidine Gluconate Cloth  6 each Topical Q0600  . dextromethorphan-guaiFENesin  1 tablet Oral BID  . digoxin  0.25 mg Intravenous Daily  . ipratropium  2 puff Inhalation Q4H  . methylPREDNISolone (SOLU-MEDROL) injection  40 mg Intravenous Daily  . multivitamin with minerals  1 tablet Oral Daily  . nicotine  21 mg Transdermal Daily  . Rivaroxaban  20 mg Oral Q supper  . sacubitril-valsartan  1 tablet Oral BID  . zinc sulfate  220 mg Oral Daily   Continuous Infusions: . amiodarone 30 mg/hr (01/25/20 1333)      LOS: 3 days   Time spent: 40 minutes.  Lorella Nimrod, MD [facility]  If 7PM-7AM, please contact night-coverage [email or web address]  01/25/2020, 2:01 PM   This record has been created using Systems analyst. Errors have been sought and corrected,but may not always be located. Such creation errors do not reflect on the standard of care.

## 2020-01-25 NOTE — Consult Note (Signed)
Stanley Marks is a 79 y.o. male  [id number]  Primary Cardiologist: Neoma Laming Reason for Consultation: Atrial fibrillation with rapid ventricular response.  HPI: This is a 79 year old male patient who presented to the hospital with shortness of breath progressive over a couple of days and was found to be in atrial fibrillation with rapid ventricular response rate.  I was asked to evaluate the patient as Cardizem was not controlling known ventricular response rate and as to whether amiodarone drip should be started.  Amiodarone drip was started last night but patient still in A. fib with rapid ventricular response rate in spite of being also on Cardizem p.o.  Patient denies any chest pain.   Review of Systems: Patient is fatigued and short of breath but no chest pain or dizziness or syncope.   Past Medical History:  Diagnosis Date  . Cancer ([place])    skin  . Emphysema of lung ([place])   . Hypertension   . PVD (peripheral vascular disease) ([place])     Medications Prior to Admission  Medication Sig Dispense Refill  . ADVAIR HFA 230-21 MCG/ACT inhaler Inhale 1 puff into the lungs 2 (two) times daily as needed.     Marland Kitchen albuterol (VENTOLIN HFA) 108 (90 Base) MCG/ACT inhaler Inhale 2 puffs into the lungs every 6 (six) hours as needed for wheezing or shortness of breath. 8 g 1  . Aspirin-Salicylamide-Caffeine (ARTHRITIS STRENGTH BC POWDER PO) Take 7 packets by mouth daily as needed.    . diltiazem (CARDIZEM SR) 60 MG 12 hr capsule Take 1 capsule (60 mg total) by mouth every 12 (twelve) hours. 60 capsule 1  . ipratropium-albuterol (DUONEB) 0.5-2.5 (3) MG/3ML SOLN Inhale 3 mLs into the lungs every 6 (six) hours as needed.    . Multiple Vitamin (MULTIVITAMIN WITH MINERALS) TABS tablet Take 1 tablet by mouth daily.    . Rivaroxaban (XARELTO) 15 MG TABS tablet Take 15 mg by mouth daily with supper.    . sacubitril-valsartan (ENTRESTO) 49-51 MG Take 1 tablet by mouth 2 (two) times daily.    Marland Kitchen  spironolactone (ALDACTONE) 25 MG tablet Take 25 mg by mouth daily.    Marland Kitchen torsemide (DEMADEX) 20 MG tablet Take 20 mg by mouth daily.    Marland Kitchen zinc sulfate 220 (50 Zn) MG capsule Take 1 capsule (220 mg total) by mouth daily.       . vitamin C  500 mg Oral Daily  . Chlorhexidine Gluconate Cloth  6 each Topical Q0600  . dextromethorphan-guaiFENesin  1 tablet Oral BID  . digoxin  0.25 mg Intravenous Once  . digoxin  0.25 mg Intravenous Daily  . ipratropium  2 puff Inhalation Q4H  . methylPREDNISolone (SOLU-MEDROL) injection  40 mg Intravenous Daily  . multivitamin with minerals  1 tablet Oral Daily  . nicotine  21 mg Transdermal Daily  . Rivaroxaban  20 mg Oral Q supper  . sacubitril-valsartan  1 tablet Oral BID  . zinc sulfate  220 mg Oral Daily    Infusions: . amiodarone 30 mg/hr (01/25/20 0520)    Allergies  Allergen Reactions  . Lipitor [Atorvastatin Calcium] Other (See Comments)    arthralgia    Social History   Socioeconomic History  . Marital status: Married    Spouse name: Not on file  . Number of children: Not on file  . Years of education: Not on file  . Highest education level: Not on file  Occupational History  . Not on file  Tobacco  Use  . Smoking status: Current Every Day Smoker    Packs/day: 0.00    Types: Cigarettes  . Smokeless tobacco: Never Used  Substance and Sexual Activity  . 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 Drug use: Never  . Sexual activity: Not on file  Other Topics Concern  . Not on file  Social History Narrative  . Not on file   Social Determinants of Health   Financial Resource Strain:   . Difficulty of Paying Living Expenses: Not on file  Food Insecurity:   . Worried About Charity fundraiser in the Last Year: Not on file  . Ran Out of Food in the Last Year: Not on file  Transportation Needs:   . Lack of Transportation (Medical): Not on file  . Lack of Transportation (Non-Medical): Not on  file  Physical Activity:   . Days of Exercise per Week: Not on file  . Minutes of Exercise per Session: Not on file  Stress:   . Feeling of Stress : Not on file  Social Connections:   . Frequency of Communication with Friends and Family: Not on file  . Frequency of Social Gatherings with Friends and Family: Not on file  . Attends Religious Services: Not on file  . Active Member of Clubs or Organizations: Not on file  . Attends Archivist Meetings: Not on file  . Marital Status: Not on file  Intimate Partner Violence:   . Fear of Current or Ex-Partner: Not on file  . Emotionally Abused: Not on file  . Physically Abused: Not on file  . Sexually Abused: Not on file    History reviewed. No pertinent family history.  PHYSICAL EXAM: Vitals:   01/25/20 0600 01/25/20 0630  BP: 98/75 96/82  Pulse: (!) 117 (!) 122  Resp: 19 19  Temp:    SpO2: 98% 99%     Intake/Output Summary (Last 24 hours) at 01/25/2020 0837 Last data filed at 01/24/2020 2357 Gross per 24 hour  Intake 761.33 ml  Output 2425 ml  Net -1663.67 ml    General:  Well appearing. No respiratory difficulty HEENT: normal Neck: supple. no JVD. Carotids 2+ bilat; no bruits. No lymphadenopathy or thryomegaly appreciated. Cor: PMI nondisplaced. Regular rate & rhythm. No rubs, gallops or murmurs. Lungs: clear Abdomen: soft, nontender, nondistended. No hepatosplenomegaly. No bruits or masses. Good bowel sounds. Extremities: no cyanosis, clubbing, rash, edema Neuro: alert & oriented x 3, cranial nerves grossly intact. moves all 4 extremities w/o difficulty. Affect pleasant.  ECG: Atrial fibrillation with rapid ventricular response rate was 156 bpm with nonspecific ST-T changes  Results for orders placed or performed during the hospital encounter of 01/22/20 (from the past 24 hour(s))  CULTURE, BLOOD (ROUTINE X 2) w Reflex to ID Panel     Status: None (Preliminary result)   Collection Time: 01/24/20  9:52 AM    Specimen: BLOOD  Result Value Ref Range   Specimen Description BLOOD RT HAND    Special Requests      BOTTLES DRAWN AEROBIC AND ANAEROBIC Blood Culture adequate volume   Culture      NO GROWTH < 24 HOURS Performed at [facility], [address] [place], [place] [address]    Report Status PENDING   Urinalysis, Complete w Microscopic     Status: Abnormal   Collection Time: 01/24/20 12:14 PM  Result Value Ref Range   Color, Urine  YELLOW (A) YELLOW   APPearance CLEAR (A) CLEAR   Specific Gravity, Urine 1.008 1.005 - 1.030   pH 5.0 5.0 - 8.0   Glucose, UA NEGATIVE NEGATIVE mg/dL   Hgb urine dipstick NEGATIVE NEGATIVE   Bilirubin Urine NEGATIVE NEGATIVE   Ketones, ur NEGATIVE NEGATIVE mg/dL   Protein, ur NEGATIVE NEGATIVE mg/dL   Nitrite NEGATIVE NEGATIVE   Leukocytes,Ua NEGATIVE NEGATIVE   WBC, UA 0-5 0 - 5 WBC/hpf   Bacteria, UA NONE SEEN NONE SEEN   Squamous Epithelial / LPF NONE SEEN 0 - 5  CULTURE, BLOOD (ROUTINE X 2) w Reflex to ID Panel     Status: None (Preliminary result)   Collection Time: 01/24/20 12:47 PM   Specimen: BLOOD  Result Value Ref Range   Specimen Description BLOOD LEFT ANTECUBITAL    Special Requests      BOTTLES DRAWN AEROBIC AND ANAEROBIC Blood Culture adequate volume   Culture      NO GROWTH < 24 HOURS Performed at [facility], [address] [place], [place] [address]    Report Status PENDING   Lactic acid, plasma     Status: Abnormal   Collection Time: 01/24/20 12:54 PM  Result Value Ref Range   Lactic Acid, Venous 2.2 (HH) 0.5 - 1.9 mmol/L  Procalcitonin - Baseline     Status: None   Collection Time: 01/24/20 12:54 PM  Result Value Ref Range   Procalcitonin <0.10 ng/mL  Lactic acid, plasma     Status: None   Collection Time: 01/24/20  3:56 PM  Result Value Ref Range   Lactic Acid, Venous 1.9 0.5 - 1.9 mmol/L  Lactic acid, plasma     Status: Abnormal   Collection Time: 01/24/20  7:13 PM  Result Value Ref Range    Lactic Acid, Venous 3.0 (HH) 0.5 - 1.9 mmol/L  Procalcitonin     Status: None   Collection Time: 01/25/20  5:44 AM  Result Value Ref Range   Procalcitonin <0.10 ng/mL  CBC     Status: Abnormal   Collection Time: 01/25/20  5:44 AM  Result Value Ref Range   WBC 16.2 (H) 4.0 - 10.5 K/uL   RBC 5.08 4.22 - 5.81 MIL/uL   Hemoglobin 15.5 13.0 - 17.0 g/dL   HCT 47.8 39.0 - 52.0 %   MCV 94.1 80.0 - 100.0 fL   MCH 30.5 26.0 - 34.0 pg   MCHC 32.4 30.0 - 36.0 g/dL   RDW 15.7 (H) 11.5 - 15.5 %   Platelets 275 150 - 400 K/uL   nRBC 0.0 0.0 - 0.2 %  Basic metabolic panel     Status: Abnormal   Collection Time: 01/25/20  5:44 AM  Result Value Ref Range   Sodium 138 135 - 145 mmol/L   Potassium 4.2 3.5 - 5.1 mmol/L   Chloride 103 98 - 111 mmol/L   CO2 31 22 - 32 mmol/L   Glucose, Bld 175 (H) 70 - 99 mg/dL   BUN 26 (H) 8 - 23 mg/dL   Creatinine, Ser 1.13 0.61 - 1.24 mg/dL   Calcium 8.8 (L) 8.9 - 10.3 mg/dL   GFR calc non Af Amer >60 >60 mL/min   GFR calc Af Amer >60 >60 mL/min   Anion gap 4 (L) 5 - 15   No results found.   ASSESSMENT AND PLAN: Atrial fibrillation with rapid ventricular response rate in spite of being on p.o. Cardizem and amiodarone drip remains in A. fib.  Advise  adding digoxin with loading dose of 0.5 mg today.  Continue 0.25 IV daily digoxin on top of IV amiodarone.  Advise discontinuing Cardizem.  Patient has recent history about a month ago of COVID-19 infection and may have underlying mild pericarditis which may have triggered the atrial fibrillation.  He has also other multiple comorbidities including malignancy thus would not be an ideal candidate for electrical cardioversion.  Will make an attempt at converting to sinus rhythm with IV amiodarone but if unsuccessful will proceed with rate control between 80 and 120 bpm.  Will get echocardiogram to evaluate whether ejection fraction has changed and to evaluate whether patient has myopericarditis.  Thank you very much for  referral.  Jannessa Ogden A

## 2020-01-26 ENCOUNTER — Ambulatory Visit: Payer: Medicare Other | Admitting: Physician Assistant

## 2020-01-26 LAB — CBC WITH DIFFERENTIAL/PLATELET
Abs Immature Granulocytes: 0.1 10*3/uL — ABNORMAL HIGH (ref 0.00–0.07)
Basophils Absolute: 0 10*3/uL (ref 0.0–0.1)
Basophils Relative: 0 %
Eosinophils Absolute: 0 10*3/uL (ref 0.0–0.5)
Eosinophils Relative: 0 %
HCT: 49.8 % (ref 39.0–52.0)
Hemoglobin: 15.9 g/dL (ref 13.0–17.0)
Immature Granulocytes: 1 %
Lymphocytes Relative: 7 %
Lymphs Abs: 1.1 10*3/uL (ref 0.7–4.0)
MCH: 30.7 pg (ref 26.0–34.0)
MCHC: 31.9 g/dL (ref 30.0–36.0)
MCV: 96.1 fL (ref 80.0–100.0)
Monocytes Absolute: 0.5 10*3/uL (ref 0.1–1.0)
Monocytes Relative: 4 %
Neutro Abs: 13.3 10*3/uL — ABNORMAL HIGH (ref 1.7–7.7)
Neutrophils Relative %: 88 %
Platelets: 283 10*3/uL (ref 150–400)
RBC: 5.18 MIL/uL (ref 4.22–5.81)
RDW: 15.9 % — ABNORMAL HIGH (ref 11.5–15.5)
WBC: 15 10*3/uL — ABNORMAL HIGH (ref 4.0–10.5)
nRBC: 0 % (ref 0.0–0.2)

## 2020-01-26 LAB — BASIC METABOLIC PANEL
Anion gap: 8 (ref 5–15)
BUN: 26 mg/dL — ABNORMAL HIGH (ref 8–23)
CO2: 27 mmol/L (ref 22–32)
Calcium: 8.7 mg/dL — ABNORMAL LOW (ref 8.9–10.3)
Chloride: 104 mmol/L (ref 98–111)
Creatinine, Ser: 1.12 mg/dL (ref 0.61–1.24)
GFR calc Af Amer: >60 mL/min (ref >60)
GFR calc non Af Amer: >60 mL/min (ref >60)
Glucose, Bld: 132 mg/dL — ABNORMAL HIGH (ref 70–99)
Potassium: 4.7 mmol/L (ref 3.5–5.1)
Sodium: 139 mmol/L (ref 135–145)

## 2020-01-26 LAB — MAGNESIUM: Magnesium: 2.5 mg/dL — ABNORMAL HIGH (ref 1.7–2.4)

## 2020-01-26 LAB — PROCALCITONIN: Procalcitonin: 0.1 ng/mL

## 2020-01-26 LAB — GLUCOSE, CAPILLARY: Glucose-Capillary: 186 mg/dL — ABNORMAL HIGH (ref 70–99)

## 2020-01-26 LAB — PHOSPHORUS: Phosphorus: 3.2 mg/dL (ref 2.5–4.6)

## 2020-01-26 MED ORDER — METOPROLOL TARTRATE 25 MG PO TABS
25.0000 mg | ORAL_TABLET | Freq: Two times a day (BID) | ORAL | Status: DC
  Administered 2020-01-26 (×2): 25 mg via ORAL
  Filled 2020-01-26 (×2): qty 1

## 2020-01-26 NOTE — TOC Initial Note (Signed)
Transition of Care [facility]) - Initial/Assessment Note    Patient Details  Name: Stanley Marks MRN: [id number] Date of Birth: [birth year]  Transition [facility]) CM/SW Contact:    Magnus Ivan, LCSW Phone Number: 01/26/2020, 2:17 PM  Clinical Narrative:                CSW met with patient over phone due to isolation status. Explained CSW role. Patient reported he lives at home with his son, daughter in law, and 3 grandchildren. He reported his family is supportive of his needs. Patient stated he has a walker, cane, and crutches at home. Patient reported Dr. Humphrey Rolls is his PCP. Request placed for Nurse Secretary to schedule PCP appointment for within 5-7 days of discharge. Patient uses [facility] for routine medicines. Patient said he uses [facility] on [address] as his local pharmacy if needed. Patient reported he received home health services in the past for wound care, but was not satisfied with the services so cancelled them. He was unsure which company he used for this. Patient currently goes to the [facility] for wound care needs. Patient reported he drives or has his daughter-in-law drive him to appointments. CSW encouraged patient to reach out with any needs. CSW will continue to follow.   Expected Discharge Plan: Home/Self Care Barriers to Discharge: Continued Medical Work up   Patient Goals and CMS Choice Patient states their goals for this hospitalization and ongoing recovery are:: to return home, lives with family      Expected Discharge Plan and Services Expected Discharge Plan: Home/Self Care   Discharge Planning Services: CM Consult   Living arrangements for the past 2 months: Single Family Home                                      Prior Living Arrangements/Services Living arrangements for the past 2 months: Single Family Home Lives with:: Adult Children, Relatives Patient language and need for interpreter reviewed:: Yes Do you feel safe going  back to the place where you live?: Yes      Need for Family Participation in Patient Care: Yes (Comment) Care giver support system in place?: Yes (comment)   Criminal Activity/Legal Involvement Pertinent to Current Situation/Hospitalization: No - Comment as needed  Activities of Daily Living   ADL Screening (condition at time of admission) Patient's cognitive ability adequate to safely complete daily activities?: Yes Is the patient deaf or have difficulty hearing?: No Does the patient have difficulty seeing, even when wearing glasses/contacts?: No Does the patient have difficulty concentrating, remembering, or making decisions?: No Patient able to express need for assistance with ADLs?: Yes Does the patient have difficulty dressing or bathing?: No Independently performs ADLs?: Yes (appropriate for developmental age) Does the patient have difficulty walking or climbing stairs?: Yes Weakness of Legs: Both Weakness of Arms/Hands: None  Permission Sought/Granted                  Emotional Assessment   Attitude/Demeanor/Rapport: Engaged Affect (typically observed): Calm Orientation: : Oriented to Self, Oriented to Place, Oriented to  Time, Oriented to Situation Alcohol / Substance Use: Not Applicable Psych Involvement: No (comment)  Admission diagnosis:  Leg pain [M79.606] Atrial fibrillation with rapid ventricular response (HCC) [I48.91] Atrial fibrillation with RVR ([place]) [I48.91] Wound of left lower extremity, initial encounter [S81.802A] Patient Active Problem List   Diagnosis Date Noted  .  Wound of left leg   . Acute respiratory failure due to COVID-19 ([place]) 12/22/2019  . COPD with chronic bronchitis ([place]) 12/22/2019  . Atrial fibrillation, chronic ([place]) 12/22/2019  . Acute respiratory failure with hypoxia ([place]) 12/22/2019  . Pneumonia due to COVID-19 virus 12/22/2019  . Atrial fibrillation with RVR ([place])   . Essential hypertension   . Influenza A 01/28/2017  .  Pneumonia 01/28/2017  . COPD exacerbation ([place]) 01/28/2017  . Tobacco abuse counseling 01/28/2017  . Patient's noncompliance with other medical treatment and regimen 01/28/2017  . Emphysema of lung ([place]) 01/28/2017  . Leukocytosis 01/28/2017  . Chronic systolic CHF (congestive heart failure) ([place]) 01/28/2017  . Bradycardia 01/28/2017  . Acute and chronic respiratory failure with hypoxia ([place]) 01/26/2017  . Obstructive sleep apnea syndrome 03/01/2016  . Personal history of other malignant neoplasm of skin 12/08/2014   PCP:  Perrin Maltese, MD Pharmacy:   [place], [place] - [address] AT [facility] [address] [place] [address] Phone: [phone] Fax: [phone]     Social Determinants of Health (SDOH) Interventions    Readmission Risk Interventions Readmission Risk Prevention Plan 01/26/2020  Transportation Screening Complete  PCP or Specialist Appt within 3-5 Days Complete  HRI or Home Care Consult Complete  Palliative Care Screening Not Applicable  Medication Review (RN Care Manager) Complete  Some recent data might be hidden

## 2020-01-26 NOTE — Progress Notes (Signed)
I came back to see the patient and heart rate was between 90 and 120 bpm.  Patient currently is on metoprolol and digoxin along with amiodarone drip.  Patient remains in atrial fibrillation and may have to stop amiodarone once the rate is controlled between 80 and 120 bpm.  Since patient is not converting to sinus rhythm amiodarone can be discontinued once the rate is controlled.  He has Covid myopericarditis and it will be difficult to convert the patient to sinus rhythm.  Advise rate control only with metoprolol and digoxin.

## 2020-01-26 NOTE — Progress Notes (Signed)
PROGRESS NOTE    Stanley Marks  [id number] DOB: 1941-06-28 DOA: 01/22/2020 PCP: Perrin Maltese, MD   Brief Narrative:  Stanley Marks is a 79 y.o. male with medical history significant of hypertension, COPD, PVD, atrial fibrillation on Xarelto, CHF, tobacco abuse, OSA, recently treated COVID-19 infection, who presents with generalized weakness, fatigue, cough, shortness breath, dizziness.  He has left lower extremity wound which has been going on for more than two weeks and is more painful recently. He is getting treatment in wound clinic. Of note, patient was recently hospitalized from 1/12-1/16 due to COVID-19 infection.  Patient was treated with remdesivir and steroid.  Patient was found to have atrial fibrillation with RVR in ED, with heart rates up to 140s.  Cardizem drip is started in ED.  Subjective: Patient was feeling better when seen this morning.  Continues to have some shortness of breath.  He was sitting on the side of the bed with heart rate in 130s to 140s.  Assessment & Plan:   Principal Problem:   Atrial fibrillation with RVR (HCC) Active Problems:   Acute and chronic respiratory failure with hypoxia (HCC)   COPD exacerbation (HCC)   Chronic systolic CHF (congestive heart failure) (HCC)   Essential hypertension   Pneumonia due to COVID-19 virus  Atrial fibrillation with RVR ([place]): likely triggerd by COPD exacerbation. CHA2DS2-VASc Score is 5, patient is on Xarelto at home. Patient continued to have heart rate in low 100s when seen this morning. TSH within normal limit. -Cardiology started him on digoxin. -Continue amiodarone infusion till heart rate improves. -Echocardiogram was ordered by cardiology to rule out myopericarditis due to his recent covert 19 infection-shows normal ejection fraction, mostly within normal limit. -Continue Xarelto. -Start him on metoprolol 25 mg twice daily-we will titrate as needed.  Hypothermia/leukocytosis/Lactic acidosis.   Hypothermia resolved.   Patient is on steroids which can partly explain leukocytosis.  Has left lower extremity wound which apparently does not appear infected.  Procalcitonin remain negative.  Blood and urine culture remain negative. Again developed lactic acidosis most likely secondary to decreased perfusion due to A. fib with RVR. -Monitor Lactic acid.3.0>>2.8 -Continue monitoring of lactic acid.  Acute and chronic respiratory failure with hypoxia due to COPD exacerbation [facility]): There was no wheezing today. -Continue bronchodilators. -Continue Solu-Medrol to 40 mg daily. -Continue as needed Mucinex for cough.  Chronic systolic CHF (congestive heart failure) [facility]): Patient has trace leg edema.  BNP 159.  Does not seem to have CHF exacerbation.  No 2D echo on record.  Appears euvolemic. -Holding home dose of spironolactone and torsemide due to softer blood pressure. -Continue Entresto. -Repeat echo done today-pending results.  Hypertension.  Mildly softer blood pressure,  -Continue home dose of Entresto . -Keep holding spironolactone and torsemide. -Keep monitoring.  Recent Pneumonia due to COVID-19 virus: Patient was treated with remdesivir and steroid.  Patient is on Xarelto, low suspicions of PE. -Continue Vitamin C and zinc -Out of infectious window now.  Left lower leg wound: does not seem to be infected. -Wound care consult.  Objective: Vitals:   01/26/20 0800 01/26/20 0900 01/26/20 1000 01/26/20 1100  BP: 122/86 (!) 104/93 96/72 (!) 139/91  Pulse: 87 73 87 91  Resp: 19 (!) 30 (!) 21 (!) 25  Temp: 97.8 F (36.6 C)     TempSrc: Oral     SpO2: 95% 95% 97% 93%  Weight:      Height:        Intake/Output Summary (Last  24 hours) at 01/26/2020 1300 Last data filed at 01/26/2020 1100 Gross per 24 hour  Intake 1387.02 ml  Output 2300 ml  Net -912.98 ml   Filed Weights   01/24/20 0500 01/25/20 0500 01/26/20 0500  Weight: 134.4 kg (!) 136.3 kg (!) 136.1 kg     Examination:  General exam: Appears calm and comfortable  Respiratory system: Clear to auscultation. Respiratory effort normal. Cardiovascular system: Irregularly irregular, no JVD, murmurs, rubs, gallops or clicks. Gastrointestinal system: Soft, nontender, nondistended, bowel sounds positive. Central nervous system: Alert and oriented. No focal neurological deficits.Symmetric 5 x 5 power. Extremities: Trace edema, no cyanosis, pulses intact and symmetrical.  Left lower extremity with clean bandage.  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 Psychiatry: Judgement and insight appear normal.    DVT prophylaxis: Xarelto Code Status: Full Family Communication: No family at bedside. Disposition Plan: Pending improvement, will go back home.  Consultants:   Cardiology  Procedures:  Antimicrobials:   Data Reviewed: I have personally reviewed following labs and imaging studies  CBC: Recent Labs  Lab 01/22/20 1048 01/23/20 0626 01/24/20 0553 01/25/20 0544 01/26/20 0410  WBC 8.0 7.9 19.2* 16.2* 15.0*  NEUTROABS 6.0  --   --   --  13.3*  HGB 16.0 15.7 15.6 15.5 15.9  HCT 50.0 48.3 47.4 47.8 49.8  MCV 94.7 93.4 93.5 94.1 96.1  PLT 231 227 263 275 [id number]   Basic Metabolic Panel: Recent Labs  Lab 01/22/20 1048 01/23/20 0626 01/24/20 0553 01/25/20 0544 01/26/20 0410  NA 145 140 139 138 139  K 3.5 3.6 3.9 4.2 4.7  CL 98 103 102 103 104  CO2 29 28 27 31 27   GLUCOSE 114* 215* 163* 175* 132*  BUN 13 19 24* 26* 26*  CREATININE 1.47* 1.34* 1.10 1.13 1.12  CALCIUM 9.4 8.7* 9.0 8.8* 8.7*  MG  --   --   --   --  2.5*  PHOS  --   --   --   --  3.2   GFR: Estimated Creatinine Clearance: 82.4 mL/min (by C-G formula based on SCr of 1.12 mg/dL). Liver Function Tests: Recent Labs  Lab 01/22/20 1048  AST 18  ALT 20  ALKPHOS 67  BILITOT 1.2  PROT 6.8  ALBUMIN 3.3*   No results for input(s): LIPASE, AMYLASE in the last 168 hours. No results for input(s): AMMONIA  in the last 168 hours. Coagulation Profile: No results for input(s): INR, PROTIME in the last 168 hours. Cardiac Enzymes: No results for input(s): CKTOTAL, CKMB, CKMBINDEX, TROPONINI in the last 168 hours. BNP (last 3 results) No results for input(s): PROBNP in the last 8760 hours. HbA1C: No results for input(s): HGBA1C in the last 72 hours. CBG: Recent Labs  Lab 01/22/20 2103 01/25/20 1544  GLUCAP 158* 186*   Lipid Profile: No results for input(s): CHOL, HDL, LDLCALC, TRIG, CHOLHDL, LDLDIRECT in the last 72 hours. Thyroid Function Tests: No results for input(s): TSH, T4TOTAL, FREET4, T3FREE, THYROIDAB in the last 72 hours. Anemia Panel: No results for input(s): VITAMINB12, FOLATE, FERRITIN, TIBC, IRON, RETICCTPCT in the last 72 hours. Sepsis Labs: Recent Labs  Lab 01/24/20 1254 01/24/20 1254 01/24/20 1556 01/24/20 1913 01/25/20 0544 01/25/20 1402 01/25/20 1702 01/26/20 0410  PROCALCITON <0.10  --   --   --  <0.10  --   --  0.10  LATICACIDVEN 2.2*   < > 1.9 3.0*  --  2.4* 2.8*  --    < > = values in  this interval not displayed.    Recent Results (from the past 240 hour(s))  Respiratory Panel by RT PCR (Flu A&B, Covid) - Nasopharyngeal Swab     Status: Abnormal   Collection Time: 01/22/20  2:52 PM   Specimen: Nasopharyngeal Swab  Result Value Ref Range Status   SARS Coronavirus 2 by RT PCR POSITIVE (A) NEGATIVE Final    Comment: RESULT CALLED TO, READ BACK BY AND VERIFIED WITH: CHRISSY BRAND @1546  01/22/20 MJU (NOTE) SARS-CoV-2 target nucleic acids are DETECTED. SARS-CoV-2 RNA is generally detectable in upper respiratory specimens  during the acute phase of infection. Positive results are indicative of the presence of the identified virus, but do not rule out bacterial infection or co-infection with other pathogens not detected by the test. Clinical correlation with patient history and other diagnostic information is necessary to determine patient infection status.  The expected result is Negative. Fact Sheet for Patients:  PinkCheek.be Fact Sheet for Healthcare Providers: GravelBags.it This test is not yet approved or cleared by the [place] FDA and  has been authorized for detection and/or diagnosis of SARS-CoV-2 by FDA under an Emergency Use Authorization (EUA).  This EUA will remain in effect (meaning this test can be used) for t he duration of  the COVID-19 declaration under Section 564(b)(1) of the Act, 21 U.S.C. section 360bbb-3(b)(1), unless the authorization is terminated or revoked sooner.    Influenza A by PCR NEGATIVE NEGATIVE Final   Influenza B by PCR NEGATIVE NEGATIVE Final    Comment: (NOTE) The Xpert Xpress SARS-CoV-2/FLU/RSV assay is intended as an aid in  the diagnosis of influenza from Nasopharyngeal swab specimens and  should not be used as a sole basis for treatment. Nasal washings and  aspirates are unacceptable for Xpert Xpress SARS-CoV-2/FLU/RSV  testing. Fact Sheet for Patients: PinkCheek.be Fact Sheet for Healthcare Providers: GravelBags.it This test is not yet approved or cleared by the [place] FDA and  has been authorized for detection and/or diagnosis of SARS-CoV-2 by  FDA under an Emergency Use Authorization (EUA). This EUA will remain  in effect (meaning this test can be used) for the duration of the  Covid-19 declaration under Section 564(b)(1) of the Act, 21  U.S.C. section 360bbb-3(b)(1), unless the authorization is  terminated or revoked. Performed at [facility], [address] [place], [place] [address]   MRSA PCR Screening     Status: None   Collection Time: 01/22/20  9:17 PM   Specimen: Nasopharyngeal  Result Value Ref Range Status   MRSA by PCR NEGATIVE NEGATIVE Final    Comment:        The GeneXpert MRSA Assay (FDA approved for NASAL specimens only), is one  component of a comprehensive MRSA colonization surveillance program. It is not intended to diagnose MRSA infection nor to guide or monitor treatment for MRSA infections. Performed at [facility], [address] [place], [place] [address]   CULTURE, BLOOD (ROUTINE X 2) w Reflex to ID Panel     Status: None (Preliminary result)   Collection Time: 01/24/20  9:52 AM   Specimen: BLOOD  Result Value Ref Range Status   Specimen Description BLOOD RT HAND  Final   Special Requests   Final    BOTTLES DRAWN AEROBIC AND ANAEROBIC Blood Culture adequate volume   Culture   Final    NO GROWTH 2 DAYS Performed at [facility], [address] [place], [place] [address]    Report Status PENDING  Incomplete  Urine Culture     Status: None   Collection Time: 01/24/20 12:14 PM   Specimen: Urine, Clean Catch  Result Value Ref Range Status   Specimen Description   Final    URINE, CLEAN CATCH Performed at [facility], [address] [place], [place] [address]    Special Requests   Final    NONE Performed at [facility], [address], [place], [place] [address]    Culture   Final    NO GROWTH Performed at [facility], [place] [address] [place], [place] [address]    Report Status 01/25/2020 FINAL  Final  CULTURE, BLOOD (ROUTINE X 2) w Reflex to ID Panel     Status: None (Preliminary result)   Collection Time: 01/24/20 12:47 PM   Specimen: BLOOD  Result Value Ref Range Status   Specimen Description BLOOD LEFT ANTECUBITAL  Final   Special Requests   Final    BOTTLES DRAWN AEROBIC AND ANAEROBIC Blood Culture adequate volume   Culture   Final    NO GROWTH 2 DAYS Performed at [facility], [address], [place], [place] [address]    Report Status PENDING  Incomplete     Radiology Studies: ECHOCARDIOGRAM COMPLETE  Result Date: 01/25/2020    ECHOCARDIOGRAM REPORT   Patient Name:   KAULDER ANN Date of Exam: 01/25/2020 Medical Rec  #:  [id number]     Height:       74.0 in Accession #:    [id number]    Weight:       300.5 lb Date of Birth:  10/04/1941     BSA:          2.58 m Patient Age:    89 years      BP:           119/96 mmHg Patient Gender: M             HR:           78 bpm. Exam Location:  [facility] Procedure: 2D Echo, Color Doppler and Cardiac Doppler Indications:     Atrial Fibrillation 427.31  History:         Patient has no prior history of Echocardiogram examinations.                  Risk Factors:Hypertension. Emphysema of lung, PVD.  Sonographer:     Sherrie Sport RDCS (AE) Referring Phys:  [id number] [place] Diagnosing Phys: Neoma Laming MD  Sonographer Comments: Technically difficult study due to poor echo windows, no apical window and no subcostal window. IMPRESSIONS  1. Left ventricular ejection fraction, by estimation, is 60 to 65%. The left ventricle has normal function. The left ventricle has no regional wall motion abnormalities. There is mild concentric left ventricular hypertrophy. Left ventricular diastolic parameters are indeterminate.  2. Right ventricular systolic function is normal. The right ventricular size is normal.  3. The mitral valve is normal in structure and function. No evidence of mitral valve regurgitation. No evidence of mitral stenosis.  4. The aortic valve is normal in structure and function. Aortic valve regurgitation is not visualized. No aortic stenosis is present.  5. The inferior vena cava is normal in size with greater than 50% respiratory variability, suggesting right atrial pressure of 3 mmHg. FINDINGS  Left Ventricle: Left ventricular ejection fraction, by estimation, is 60 to 65%. The left ventricle has normal function. The left ventricle has no regional wall motion abnormalities. The  left ventricular internal cavity size was normal in size. There is  mild concentric left ventricular hypertrophy. Left ventricular diastolic parameters are indeterminate. Right Ventricle: The right ventricular  size is normal. No increase in right ventricular wall thickness. Right ventricular systolic function is normal. Left Atrium: Left atrial size was normal in size. Right Atrium: Right atrial size was normal in size. Pericardium: A small pericardial effusion is present. The pericardial effusion is circumferential. Mitral Valve: The mitral valve is normal in structure and function. Normal mobility of the mitral valve leaflets. No evidence of mitral valve regurgitation. No evidence of mitral valve stenosis. Tricuspid Valve: The tricuspid valve is normal in structure. Tricuspid valve regurgitation is trivial. No evidence of tricuspid stenosis. Aortic Valve: The aortic valve is normal in structure and function. Aortic valve regurgitation is not visualized. No aortic stenosis is present. Pulmonic Valve: The pulmonic valve was normal in structure. Pulmonic valve regurgitation is trivial. No evidence of pulmonic stenosis. Aorta: The aortic root is normal in size and structure. Venous: The inferior vena cava is normal in size with greater than 50% respiratory variability, suggesting right atrial pressure of 3 mmHg. IAS/Shunts: No atrial level shunt detected by color flow Doppler.  LEFT VENTRICLE PLAX 2D LVIDd:         4.70 cm LVIDs:         2.57 cm LV PW:         1.24 cm LV IVS:        1.44 cm LVOT diam:     2.20 cm LV SV Index:   28.85 LVOT Area:     3.80 cm  LEFT ATRIUM         Index LA diam:    4.90 cm 1.90 cm/m                        PULMONIC VALVE AORTA                 PV Vmax:        0.80 m/s Ao Root diam: 3.00 cm PV Peak grad:   2.6 mmHg                       RVOT Peak grad: 4 mmHg   SHUNTS Systemic Diam: 2.20 cm Neoma Laming MD Electronically signed by Neoma Laming MD Signature Date/Time: 01/25/2020/2:23:00 PM    Final     Scheduled Meds: . vitamin C  500 mg Oral Daily  . Chlorhexidine Gluconate Cloth  6 each Topical Q0600  . dextromethorphan-guaiFENesin  1 tablet Oral BID  . digoxin  0.25 mg Intravenous Daily    . ipratropium  2 puff Inhalation Q4H  . methylPREDNISolone (SOLU-MEDROL) injection  40 mg Intravenous Daily  . metoprolol tartrate  25 mg Oral BID  . multivitamin with minerals  1 tablet Oral Daily  . nicotine  21 mg Transdermal Daily  . Rivaroxaban  20 mg Oral Q supper  . sacubitril-valsartan  1 tablet Oral BID  . zinc sulfate  220 mg Oral Daily   Continuous Infusions: . amiodarone 30 mg/hr (01/26/20 0500)     LOS: 4 days   Time spent: 40 minutes.  Lorella Nimrod, MD [facility]  If 7PM-7AM, please contact night-coverage [email or web address]  01/26/2020, 1:00 PM   This record has been created using Systems analyst. Errors have been sought and corrected,but may not always be located. Such creation errors do not reflect on the  standard of care.

## 2020-01-27 DIAGNOSIS — I739 Peripheral vascular disease, unspecified: Secondary | ICD-10-CM

## 2020-01-27 DIAGNOSIS — E669 Obesity, unspecified: Secondary | ICD-10-CM

## 2020-01-27 LAB — COMPREHENSIVE METABOLIC PANEL
ALT: 98 U/L — ABNORMAL HIGH (ref 0–44)
AST: 39 U/L (ref 15–41)
Albumin: 3.2 g/dL — ABNORMAL LOW (ref 3.5–5.0)
Alkaline Phosphatase: 70 U/L (ref 38–126)
Anion gap: 9 (ref 5–15)
BUN: 33 mg/dL — ABNORMAL HIGH (ref 8–23)
CO2: 27 mmol/L (ref 22–32)
Calcium: 8.8 mg/dL — ABNORMAL LOW (ref 8.9–10.3)
Chloride: 101 mmol/L (ref 98–111)
Creatinine, Ser: 1.17 mg/dL (ref 0.61–1.24)
GFR calc Af Amer: >60 mL/min (ref >60)
GFR calc non Af Amer: >60 mL/min (ref >60)
Glucose, Bld: 198 mg/dL — ABNORMAL HIGH (ref 70–99)
Potassium: 4.9 mmol/L (ref 3.5–5.1)
Sodium: 137 mmol/L (ref 135–145)
Total Bilirubin: 0.7 mg/dL (ref 0.3–1.2)
Total Protein: 6.6 g/dL (ref 6.5–8.1)

## 2020-01-27 LAB — CBC
HCT: 53 % — ABNORMAL HIGH (ref 39.0–52.0)
Hemoglobin: 16.9 g/dL (ref 13.0–17.0)
MCH: 30.2 pg (ref 26.0–34.0)
MCHC: 31.9 g/dL (ref 30.0–36.0)
MCV: 94.8 fL (ref 80.0–100.0)
Platelets: 266 10*3/uL (ref 150–400)
RBC: 5.59 MIL/uL (ref 4.22–5.81)
RDW: 15.5 % (ref 11.5–15.5)
WBC: 14.2 10*3/uL — ABNORMAL HIGH (ref 4.0–10.5)
nRBC: 0 % (ref 0.0–0.2)

## 2020-01-27 LAB — LACTIC ACID, PLASMA: Lactic Acid, Venous: 2.1 mmol/L (ref 0.5–1.9)

## 2020-01-27 MED ORDER — AMIODARONE HCL 200 MG PO TABS
400.0000 mg | ORAL_TABLET | Freq: Two times a day (BID) | ORAL | Status: DC
  Administered 2020-01-27 – 2020-01-28 (×3): 400 mg via ORAL
  Filled 2020-01-27 (×3): qty 2

## 2020-01-27 MED ORDER — METOPROLOL SUCCINATE ER 50 MG PO TB24
50.0000 mg | ORAL_TABLET | Freq: Two times a day (BID) | ORAL | Status: DC
  Administered 2020-01-27 – 2020-01-28 (×3): 50 mg via ORAL
  Filled 2020-01-27 (×4): qty 1

## 2020-01-27 NOTE — Progress Notes (Signed)
SUBJECTIVE: Patient remains in atrial fibrillation with rapid ventricular rate with no acute events overnight. Patient with no complaints.   Vitals:   01/27/20 0815 01/27/20 0900 01/27/20 1000 01/27/20 1100  BP: (!) 139/92 (!) 123/108 (!) 120/95 (!) 129/91  Pulse: (!) 27 73 71   Resp: 18 (!) 26 20   Temp:      TempSrc:      SpO2: 93% 92% 95%   Weight:      Height:        Intake/Output Summary (Last 24 hours) at 01/27/2020 1243 Last data filed at 01/27/2020 0554 Gross per 24 hour  Intake 1196.26 ml  Output 2475 ml  Net -1278.74 ml    LABS: Basic Metabolic Panel: Recent Labs    01/26/20 0410 01/27/20 0316  NA 139 137  K 4.7 4.9  CL 104 101  CO2 27 27  GLUCOSE 132* 198*  BUN 26* 33*  CREATININE 1.12 1.17  CALCIUM 8.7* 8.8*  MG 2.5*  --   PHOS 3.2  --    Liver Function Tests: Recent Labs    01/27/20 0316  AST 39  ALT 98*  ALKPHOS 70  BILITOT 0.7  PROT 6.6  ALBUMIN 3.2*   No results for input(s): LIPASE, AMYLASE in the last 72 hours. CBC: Recent Labs    01/26/20 0410 01/27/20 0316  WBC 15.0* 14.2*  NEUTROABS 13.3*  --   HGB 15.9 16.9  HCT 49.8 53.0*  MCV 96.1 94.8  PLT 283 266   Cardiac Enzymes: No results for input(s): CKTOTAL, CKMB, CKMBINDEX, TROPONINI in the last 72 hours. BNP: Invalid input(s): POCBNP D-Dimer: No results for input(s): DDIMER in the last 72 hours. Hemoglobin A1C: No results for input(s): HGBA1C in the last 72 hours. Fasting Lipid Panel: No results for input(s): CHOL, HDL, LDLCALC, TRIG, CHOLHDL, LDLDIRECT in the last 72 hours. Thyroid Function Tests: No results for input(s): TSH, T4TOTAL, T3FREE, THYROIDAB in the last 72 hours.  Invalid input(s): FREET3 Anemia Panel: No results for input(s): VITAMINB12, FOLATE, FERRITIN, TIBC, IRON, RETICCTPCT in the last 72 hours.   PHYSICAL EXAM General: Well developed, well nourished, in no acute distress HEENT:  Normocephalic and atramatic Neck:  No JVD.  Lungs: Clear bilaterally  to auscultation and percussion. Heart: HRRR . Normal S1 and S2 without gallops or murmurs.  Abdomen: Bowel sounds are positive, abdomen soft and non-tender  Msk:  Back normal, normal gait. Normal strength and tone for age. Extremities: No clubbing, cyanosis or edema.   Neuro: Alert and oriented X 3. Psych:  Good affect, responds appropriately  TELEMETRY: A fib with RVR. HR 105-135  ASSESSMENT AND PLAN: Patient remains in A fib RVR but stable with BP and asymptomatic. Patient started on metoprolol 25mg  BID yesterday and remains on an amiodarone drip. Partial rate control overnight. Increased metoprolol to 50mg  BID this morning with plans to discontinue amiodarone drip once better rate controlled. Continue daily digoxin.  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

## 2020-01-27 NOTE — Evaluation (Signed)
Physical Therapy Evaluation Patient Details Name: Stanley Marks MRN: [id number] DOB: 1941-08-20 Today's Date: 01/27/2020   History of Present Illness  Stanley Marks is a 33yoM who comes to [facility] on 2/12 c malaise, cough, SOB, giddiness. Pt COVID (+). Recently hospitalized from 1/12-1/16 due to COVID-19 infection. Patient was found to have atrial fibrillation with RVR in ED, with heart rates up to 140s. Pt has a chronic LLE wound, recently more painful. PMH: HTN, COPD, PVD, atrial fibrillation on Xarelto, CHF, tobacco abuse, OSA, recently treated COVID-19 infection. 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 Per CSW note: pt lives at home with his son, daughter in law, and 3 grandchildren. He reported his family is supportive of his needs.  Clinical Impression  Pt admitted with above diagnosis. Pt currently with functional limitations due to the deficits listed below (see "PT Problem List"). Upon entry, pt in bed, awake and agreeable to participate. The pt is alert and oriented x4, pleasant, conversational, and generally a good historian. Bed mobility, transfers, AMB all performed safely at Supervision level or better. Pt has awareness of lines/leads, makes efforts to protect during mobility. Pt on O2 upon arrival, but actually does well on room air AMB, 90% or better. Resting saturations fluctuate 89-94% unrelated to exertion. Functional mobility assessment demonstrates increased effort/time requirements, fair tolerance, but no need for physical assistance, whereas the patient performed these at a similar level of independence PTA. Pt does endorse some acute weakness, but is not concerned about his ability to safely access his home. Pt not interested in PT services at [place], although he would benefit from them. Pt reports he has had a "long, good life, a veteran of 25 years, and when (he is) ready to go, the [facility] can have (him.)" Patient is at baseline of  functional independence, all education completed, and time is given to address all questions/concerns. No additional skilled PT services needed at this time, PT signing off. PT recommends daily ambulation ad lib or with nursing staff as needed to prevent deconditioning.       Follow Up Recommendations Home health PT;Other (comment)(pt not interested in HHPT services at this time.)    Equipment Recommendations  None recommended by PT    Recommendations for Other Services       Precautions / Restrictions Precautions Precautions: Fall Restrictions Weight Bearing Restrictions: No      Mobility  Bed Mobility Overal bed mobility: Modified Independent                Transfers Overall transfer level: Modified independent Equipment used: Rolling walker (2 wheeled)             General transfer comment: labored, but fluent and safe  Ambulation/Gait Ambulation/Gait assistance: Supervision;Min guard Gait Distance (Feet): 150 Feet Assistive device: Rolling walker (2 wheeled) Gait Pattern/deviations: Step-to pattern     General Gait Details: author manages lines/leads; pt performs multiple turns in room without LOB  Stairs            Wheelchair Mobility    Modified Rankin (Stroke Patients Only)       Balance Overall balance assessment: Modified Independent;History of Falls                                           Pertinent Vitals/Pain Pain Assessment: Faces Faces Pain Scale: Hurts a little  bit Pain Location: LLE wound pain, as per baseline Pain Descriptors / Indicators: Aching Pain Intervention(s): Monitored during session;Limited activity within patient's tolerance    Home Living Family/patient expects to be discharged to:: Private residence Living Arrangements: Children Available Help at Discharge: Family(DIL, son, 3 Grandchildren.) Type of Home: Apartment Home Access: Stairs to enter Entrance Stairs-Rails: None Entrance  Stairs-Number of Steps: 2-3 partial steps Home Layout: One level Home Equipment: Cane - single point;Walker - 2 wheels      Prior Function Level of Independence: Independent with assistive device(s);Needs assistance   Gait / Transfers Assistance Needed: RW in household AMB due to 2 recent falls.  ADL's / Homemaking Assistance Needed: independently.        Hand Dominance   Dominant Hand: Right    Extremity/Trunk Assessment   Upper Extremity Assessment Upper Extremity Assessment: Generalized weakness;Overall [facility] for tasks assessed    Lower Extremity Assessment Lower Extremity Assessment: Generalized weakness;Overall WFL for tasks assessed       Communication   Communication: No difficulties  Cognition Arousal/Alertness: Awake/alert Behavior During Therapy: WFL for tasks assessed/performed Overall Cognitive Status: Within Functional Limits for tasks assessed                                        General Comments      Exercises     Assessment/Plan    PT Assessment All further PT needs can be met in the next venue of care  PT Problem List Decreased strength;Decreased activity tolerance;Decreased balance;Decreased mobility       PT Treatment Interventions Functional mobility training;Therapeutic activities;Therapeutic exercise;Balance training;Gait training    PT Goals (Current goals can be found in the Care Plan section)  Acute Rehab PT Goals Patient Stated Goal: Return to home. PT Goal Formulation: All assessment and education complete, DC therapy    Frequency     Barriers to discharge        Co-evaluation               AM-PAC PT "6 Clicks" Mobility  Outcome Measure Help needed turning from your back to your side while in a flat bed without using bedrails?: A Little Help needed moving from lying on your back to sitting on the side of a flat bed without using bedrails?: A Little Help needed moving to and from a bed to a chair  (including a wheelchair)?: A Little Help needed standing up from a chair using your arms (e.g., wheelchair or bedside chair)?: A Little Help needed to walk in hospital room?: A Little Help needed climbing 3-5 steps with a railing? : A Little 6 Click Score: 18    End of Session Equipment Utilized During Treatment: Oxygen Activity Tolerance: Patient tolerated treatment well;No increased pain Patient left: in bed;with call bell/phone within reach;Other (comment) Nurse Communication: Mobility status PT Visit Diagnosis: Unsteadiness on feet (R26.81);Difficulty in walking, not elsewhere classified (R26.2)    Time: 6568-1275 PT Time Calculation (min) (ACUTE ONLY): 29 min   Charges:   PT Evaluation $PT Eval Moderate Complexity: 1 Mod PT Treatments $Therapeutic Exercise: 8-22 mins        5:52 PM, 01/27/20 Etta Grandchild, PT, DPT Physical Therapist - [facility]  [phone] ([place])    Makana Feigel C 01/27/2020, 5:47 PM

## 2020-01-27 NOTE — Progress Notes (Signed)
Patient ID: Stanley Marks, male   DOB: May 13, 1941, 79 y.o.   MRN: [id number] [facility] PROGRESS NOTE  Stanley Marks [id number] DOB: 06-28-1941 DOA: 01/22/2020 PCP: Perrin Maltese, MD  HPI/Subjective: Patient asked me to get him off the amiodarone drip this morning.  Patient states he feels fine and he actually wanted to go home today.  I said his heart rate was too elevated in order to make that decision.  Heart rates this afternoon have improved but still very tenuous on where his heart rate goes.  Objective: Vitals:   01/27/20 1400 01/27/20 1500  BP: (!) 130/103 (!) 122/93  Pulse: 78 76  Resp:  11  Temp: 98.2 F (36.8 C)   SpO2: 93% 92%    Intake/Output Summary (Last 24 hours) at 01/27/2020 1653 Last data filed at 01/27/2020 0554 Gross per 24 hour  Intake 1129.64 ml  Output 2475 ml  Net -1345.36 ml   Filed Weights   01/25/20 0500 01/26/20 0500 01/27/20 0500  Weight: (!) 136.3 kg (!) 136.1 kg (!) 136.1 kg    ROS: Review of Systems  Constitutional: Negative for chills and fever.  Eyes: Negative for blurred vision.  Respiratory: Negative for cough and shortness of breath.   Cardiovascular: Negative for chest pain.  Gastrointestinal: Negative for abdominal pain, constipation, diarrhea, nausea and vomiting.  Genitourinary: Negative for dysuria.  Musculoskeletal: Negative for joint pain.  Neurological: Negative for dizziness and headaches.   Exam: Physical Exam  Constitutional: He is oriented to person, place, and time.  HENT:  Nose: No mucosal edema.  Mouth/Throat: No oropharyngeal exudate or posterior oropharyngeal edema.  Eyes: Conjunctivae and lids are normal.  Neck: Carotid bruit is not present.  Cardiovascular: S1 normal and S2 normal. An irregularly irregular rhythm present. Tachycardia present. Exam reveals no gallop.  No murmur heard. Respiratory: No respiratory distress. He has decreased breath sounds in the right lower field and the left lower field.  He has no wheezes. He has no rhonchi. He has no rales.  GI: Soft. Bowel sounds are normal. There is no abdominal tenderness.  Musculoskeletal:     Right ankle: Swelling present.     Left ankle: Swelling present.  Lymphadenopathy:    He has no cervical adenopathy.  Neurological: He is alert and oriented to person, place, and time. No cranial nerve deficit.  Skin: Skin is warm. Nails show no clubbing.  Splotchy nonblanching discoloration lower extremities.  Wound covered left lower extremity  Psychiatric: He has a normal mood and affect.      Data Reviewed: Basic Metabolic Panel: Recent Labs  Lab 01/23/20 0626 01/24/20 0553 01/25/20 0544 01/26/20 0410 01/27/20 0316  NA 140 139 138 139 137  K 3.6 3.9 4.2 4.7 4.9  CL 103 102 103 104 101  CO2 28 27 31 27 27   GLUCOSE 215* 163* 175* 132* 198*  BUN 19 24* 26* 26* 33*  CREATININE 1.34* 1.10 1.13 1.12 1.17  CALCIUM 8.7* 9.0 8.8* 8.7* 8.8*  MG  --   --   --  2.5*  --   PHOS  --   --   --  3.2  --    Liver Function Tests: Recent Labs  Lab 01/22/20 1048 01/27/20 0316  AST 18 39  ALT 20 98*  ALKPHOS 67 70  BILITOT 1.2 0.7  PROT 6.8 6.6  ALBUMIN 3.3* 3.2*   CBC: Recent Labs  Lab 01/22/20 1048 01/22/20 1048 01/23/20 [id number] 01/24/20 [id number] 01/25/20 0544 01/26/20 0410 01/27/20 [id number]  WBC 8.0   < > 7.9 19.2* 16.2* 15.0* 14.2*  NEUTROABS 6.0  --   --   --   --  13.3*  --   HGB 16.0   < > 15.7 15.6 15.5 15.9 16.9  HCT 50.0   < > 48.3 47.4 47.8 49.8 53.0*  MCV 94.7   < > 93.4 93.5 94.1 96.1 94.8  PLT 231   < > 227 263 275 283 266   < > = values in this interval not displayed.   BNP (last 3 results) Recent Labs    12/22/19 0151 01/22/20 1055  BNP 174.0* 159.0*     CBG: Recent Labs  Lab 01/22/20 2103 01/25/20 1544  GLUCAP 158* 186*    Recent Results (from the past 240 hour(s))  Respiratory Panel by RT PCR (Flu A&B, Covid) - Nasopharyngeal Swab     Status: Abnormal   Collection Time: 01/22/20  2:52 PM    Specimen: Nasopharyngeal Swab  Result Value Ref Range Status   SARS Coronavirus 2 by RT PCR POSITIVE (A) NEGATIVE Final    Comment: RESULT CALLED TO, READ BACK BY AND VERIFIED WITH: CHRISSY BRAND @1546  01/22/20 MJU (NOTE) SARS-CoV-2 target nucleic acids are DETECTED. SARS-CoV-2 RNA is generally detectable in upper respiratory specimens  during the acute phase of infection. Positive results are indicative of the presence of the identified virus, but do not rule out bacterial infection or co-infection with other pathogens not detected by the test. Clinical correlation with patient history and other diagnostic information is necessary to determine patient infection status. The expected result is Negative. Fact Sheet for Patients:  PinkCheek.be Fact Sheet for Healthcare Providers: GravelBags.it This test is not yet approved or cleared by the [place] FDA and  has been authorized for detection and/or diagnosis of SARS-CoV-2 by FDA under an Emergency Use Authorization (EUA).  This EUA will remain in effect (meaning this test can be used) for t he duration of  the COVID-19 declaration under Section 564(b)(1) of the Act, 21 U.S.C. section 360bbb-3(b)(1), unless the authorization is terminated or revoked sooner.    Influenza A by PCR NEGATIVE NEGATIVE Final   Influenza B by PCR NEGATIVE NEGATIVE Final    Comment: (NOTE) The Xpert Xpress SARS-CoV-2/FLU/RSV assay is intended as an aid in  the diagnosis of influenza from Nasopharyngeal swab specimens and  should not be used as a sole basis for treatment. Nasal washings and  aspirates are unacceptable for Xpert Xpress SARS-CoV-2/FLU/RSV  testing. Fact Sheet for Patients: PinkCheek.be Fact Sheet for Healthcare Providers: GravelBags.it This test is not yet approved or cleared by the [place] FDA and  has been authorized  for detection and/or diagnosis of SARS-CoV-2 by  FDA under an Emergency Use Authorization (EUA). This EUA will remain  in effect (meaning this test can be used) for the duration of the  Covid-19 declaration under Section 564(b)(1) of the Act, 21  U.S.C. section 360bbb-3(b)(1), unless the authorization is  terminated or revoked. Performed at [facility], [address] [place], [place] [address]   MRSA PCR Screening     Status: None   Collection Time: 01/22/20  9:17 PM   Specimen: Nasopharyngeal  Result Value Ref Range Status   MRSA by PCR NEGATIVE NEGATIVE Final    Comment:        The GeneXpert MRSA Assay (FDA approved for NASAL specimens only), is one component of a comprehensive MRSA colonization surveillance program. It is not intended to diagnose MRSA  infection nor to guide or monitor treatment for MRSA infections. Performed at [facility], [address] [place], [place] [address]   CULTURE, BLOOD (ROUTINE X 2) w Reflex to ID Panel     Status: None (Preliminary result)   Collection Time: 01/24/20  9:52 AM   Specimen: BLOOD  Result Value Ref Range Status   Specimen Description BLOOD RT HAND  Final   Special Requests   Final    BOTTLES DRAWN AEROBIC AND ANAEROBIC Blood Culture adequate volume   Culture   Final    NO GROWTH 3 DAYS Performed at [facility], [address], [place], [place] [address]    Report Status PENDING  Incomplete  Urine Culture     Status: None   Collection Time: 01/24/20 12:14 PM   Specimen: Urine, Clean Catch  Result Value Ref Range Status   Specimen Description   Final    URINE, CLEAN CATCH Performed at [facility], [address], [place], [place] [address]    Special Requests   Final    NONE Performed at [facility], [address], [place], [place] [address]    Culture   Final    NO GROWTH Performed at [facility], [place] [address] [place], [place] [address]    Report  Status 01/25/2020 FINAL  Final  CULTURE, BLOOD (ROUTINE X 2) w Reflex to ID Panel     Status: None (Preliminary result)   Collection Time: 01/24/20 12:47 PM   Specimen: BLOOD  Result Value Ref Range Status   Specimen Description BLOOD LEFT ANTECUBITAL  Final   Special Requests   Final    BOTTLES DRAWN AEROBIC AND ANAEROBIC Blood Culture adequate volume   Culture   Final    NO GROWTH 3 DAYS Performed at [facility], [address] [place], [place] [address]    Report Status PENDING  Incomplete     Scheduled Meds: . amiodarone  400 mg Oral BID  . vitamin C  500 mg Oral Daily  . Chlorhexidine Gluconate Cloth  6 each Topical Q0600  . dextromethorphan-guaiFENesin  1 tablet Oral BID  . digoxin  0.25 mg Intravenous Daily  . ipratropium  2 puff Inhalation Q4H  . methylPREDNISolone (SOLU-MEDROL) injection  40 mg Intravenous Daily  . metoprolol succinate  50 mg Oral BID  . multivitamin with minerals  1 tablet Oral Daily  . nicotine  21 mg Transdermal Daily  . Rivaroxaban  20 mg Oral Q supper  . sacubitril-valsartan  1 tablet Oral BID  . zinc sulfate  220 mg Oral Daily   Continuous Infusions: . amiodarone Stopped (01/27/20 1652)    Assessment/Plan:  1. Atrial fibrillation with rapid ventricular response.  Patient has a history of chronic atrial fibrillation.  Patient on amiodarone drip and he wanted to be converted off to oral amiodarone.  Patient on metoprolol and IV digoxin.  Need to see what happens when he moves around.  Heart rate at rest seems to have improved this afternoon. Patient on Xarelto for anticoagulation.  We will give a dose of IV magnesium. 2. Chronic systolic congestive heart failure on metoprolol and Entresto. 3. COPD exacerbation on Solu-Medrol 4. Recent COVID-19 pneumonia previously treated with remdesivir and steroid. 5. Left lower extremity wound.  Follows at the wound care center. 6. Obesity with a BMI of 38.52 7. Peripheral vascular disease on  Xarelto  Code Status:     Code Status Orders  (From admission,  onward)         Start     Ordered   01/22/20 2110  Full code  Continuous     01/22/20 2109        Code Status History    Date Active Date Inactive Code Status Order ID Comments User Context   12/22/2019 1759 12/26/2019 1912 Full Code [id number]  Barb Merino, MD Inpatient   12/22/2019 1359 12/22/2019 1759 DNR [id number]  Barb Merino, MD Inpatient   12/22/2019 0826 12/22/2019 1339 DNR [id number]  Loletha Grayer, MD ED   12/22/2019 0511 12/22/2019 0825 Full Code [id number]  Athena Masse, MD ED   01/25/2017 0105 01/28/2017 1434 Full Code [id number]  Hugelmeyer, Ubaldo Glassing, DO Inpatient   Advance Care Planning Activity      Disposition Plan: To be determined  Consultants:  Cardiology  Time spent: 28 minutes  [place]

## 2020-01-27 NOTE — Progress Notes (Signed)
Pt up out of bed the [facility] to have BM. Pt has been in bed x days and has not been able to tolerate getting up d/t his elevated heart rate. CHG was given to pt while he was up to commode. RN was assisting pt with peri care after he finished using the bathroom and discovered a small round 4x4x1cm wound to the pts right buttock. Pt states he gets these often and education was given about how to prevent these wounds from occurring. Wound was cleansed with sterile saline, pat dried and barrier cream was applied along with fresh underwear. Will pass findings along to the oncoming RN for further treatment and management. Consult for wound RN evaluation placed.

## 2020-01-28 LAB — BASIC METABOLIC PANEL
Anion gap: 5 (ref 5–15)
BUN: 31 mg/dL — ABNORMAL HIGH (ref 8–23)
CO2: 28 mmol/L (ref 22–32)
Calcium: 8.7 mg/dL — ABNORMAL LOW (ref 8.9–10.3)
Chloride: 105 mmol/L (ref 98–111)
Creatinine, Ser: 1.24 mg/dL (ref 0.61–1.24)
GFR calc Af Amer: >60 mL/min (ref >60)
GFR calc non Af Amer: 56 mL/min — ABNORMAL LOW (ref >60)
Glucose, Bld: 127 mg/dL — ABNORMAL HIGH (ref 70–99)
Potassium: 4.9 mmol/L (ref 3.5–5.1)
Sodium: 138 mmol/L (ref 135–145)

## 2020-01-28 LAB — DIGOXIN LEVEL: Digoxin Level: 0.9 ng/mL (ref 0.8–2.0)

## 2020-01-28 LAB — HEPATITIS C ANTIBODY: HCV Ab: NONREACTIVE

## 2020-01-28 MED ORDER — DIGOXIN 125 MCG PO TABS
0.1250 mg | ORAL_TABLET | ORAL | Status: DC
  Administered 2020-01-28: 0.125 mg via ORAL
  Filled 2020-01-28: qty 1

## 2020-01-28 MED ORDER — ASCORBIC ACID 500 MG PO TABS: 500.0000 mg | ORAL_TABLET | Freq: Every day | ORAL | 0 refills | Status: DC

## 2020-01-28 MED ORDER — NICOTINE 21 MG/24HR TD PT24: MEDICATED_PATCH | TRANSDERMAL | 0 refills | Status: DC

## 2020-01-28 MED ORDER — AMIODARONE HCL 200 MG PO TABS: 400.0000 mg | ORAL_TABLET | Freq: Two times a day (BID) | ORAL | 0 refills | Status: DC

## 2020-01-28 MED ORDER — METOPROLOL SUCCINATE ER 50 MG PO TB24: 50.0000 mg | ORAL_TABLET | Freq: Two times a day (BID) | ORAL | 0 refills | Status: DC

## 2020-01-28 MED ORDER — DIGOXIN 125 MCG PO TABS: 0.1250 mg | ORAL_TABLET | ORAL | 0 refills | Status: DC

## 2020-01-28 NOTE — TOC Transition Note (Signed)
Transition of Care [facility]) - CM/SW Discharge Note   Patient Details  Name: Stanley Marks MRN: [id number] Date of Birth: 09/21/1941  [facility] CM/SW Contact:  Candie Chroman, LCSW Phone Number: 01/28/2020, 12:51 PM   Clinical Narrative:  Patient has orders to discharge home today. He needs an overnight oximetry to qualify for nighttime oxygen or pay privately. Patient said he does not want to stay overnight and does not want to private pay. He also does not feel like he needs it. Patient has a ride home today. Updated MD and RN. No further concerns. CSW signing off.  Final next level of care: Home/Self Care Barriers to Discharge: Barriers Resolved   Patient Goals and CMS Choice Patient states their goals for this hospitalization and ongoing recovery are:: to return home, lives with family      Discharge Placement                Patient to be transferred to facility by: Patient said he has someone to pick him up.   Patient and family notified of of transfer: 01/28/20  Discharge Plan and Services   Discharge Planning Services: CM Consult                                 Social Determinants of Health ([place]) Interventions     Readmission Risk Interventions Readmission Risk Prevention Plan 01/26/2020  Transportation Screening Complete  PCP or Specialist Appt within 3-5 Days Complete  HRI or Home Care Consult Complete  Palliative Care Screening Not Applicable  Medication Review (RN Care Manager) Complete  Some recent data might be hidden

## 2020-01-28 NOTE — Discharge Instructions (Signed)

## 2020-01-28 NOTE — Discharge Summary (Signed)
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 Cancer ([place])    skin  . Emphysema of lung ([place])   . Hypertension   . PVD (peripheral vascular disease) ([place])     HOSPITAL COURSE:   1.  Atrial fibrillation with rapid ventricular response.  The patient has a history of chronic atrial fibrillation.  The patient was on amiodarone drip and was converted over to oral amiodarone 400 mg twice daily.  Dr. Humphrey Rolls cardiology recommended a slow taper.  He will follow-up with Dr. Humphrey Rolls for a tapering of this medication.  He was also on IV digoxin and oral Toprol.  Since the patient did have a 2.4-second pause we got a digoxin level which was normal range.  Dr. Humphrey Rolls recommended digoxin orally every other day. 2.  Chronic systolic congestive heart failure on metoprolol, Entresto and can go back on torsemide as outpatient 3.  COPD exacerbation.  The patient finished Solu-Medrol course while here in the hospital.  Lungs are clear upon discharge no need for further Solu-Medrol. 4.  Recent COVID-19 pneumonia  treated with remdesivir and steroid 5.  Left lower extremity wound covered.  Patient follows at the wound care center.  Patient also has these nonblanching lesions on both of his legs.  Hepatitis C is negative.  Can follow-up with dermatology as outpatient 6.  Obesity with a BMI of 38.27.  Weight loss needed 7.  Nocturnal hypoxia with pulse ox dipping down to 87%.  I was unable to qualify him for oxygen without a overnight oximetry test even though he was on pulse ox overnight in the ICU.  Patient did not want to stay in the hospital any further and wanted to go home.  Consider sleep study as outpatient or even an overnight oximetry. 8.  Peripheral vascular disease on Xarelto.   DISCHARGE CONDITIONS:   Satisfactory  CONSULTS OBTAINED:  Treatment Team:  Dionisio David, MD  DRUG ALLERGIES:   Allergies  Allergen Reactions  . Lipitor [Atorvastatin Calcium] Other (See Comments)    arthralgia    DISCHARGE MEDICATIONS:   Allergies as of 01/28/2020      Reactions   Lipitor [atorvastatin Calcium] Other (See Comments)   arthralgia      Medication List    STOP taking these medications   ARTHRITIS STRENGTH BC POWDER PO   diltiazem 60 MG 12 hr capsule Commonly known as: CARDIZEM SR   spironolactone 25 MG tablet Commonly known as: ALDACTONE     TAKE these medications   Advair HFA 230-21 MCG/ACT inhaler Generic drug: fluticasone-salmeterol Inhale 1 puff into the lungs 2 (  two) times daily as needed.   albuterol 108 (90 Base) MCG/ACT inhaler Commonly known as: VENTOLIN HFA Inhale 2 puffs into the lungs every 6 (six) hours as needed for wheezing or shortness of breath.   amiodarone 200 MG tablet Commonly known as: PACERONE Take 2 tablets (400 mg total) by mouth 2 (two) times daily.   ascorbic acid 500 MG tablet Commonly known as: VITAMIN C Take 1 tablet (500 mg total) by mouth daily. Start taking on: January 29, 2020   digoxin 0.125 MG tablet Commonly known as:  LANOXIN Take 1 tablet (0.125 mg total) by mouth every other day.   Entresto 49-51 MG Generic drug: sacubitril-valsartan Take 1 tablet by mouth 2 (two) times daily.   ipratropium-albuterol 0.5-2.5 (3) MG/3ML Soln Commonly known as: DUONEB Inhale 3 mLs into the lungs every 6 (six) hours as needed.   metoprolol succinate 50 MG 24 hr tablet Commonly known as: TOPROL-XL Take 1 tablet (50 mg total) by mouth in the morning and at bedtime. Take with or immediately following a meal.   multivitamin with minerals Tabs tablet Take 1 tablet by mouth daily.   nicotine 21 mg/24hr patch Commonly known as: NICODERM CQ - dosed in mg/24 hours One patch chest wall daily (okay to substitute generic)   Rivaroxaban 15 MG Tabs tablet Commonly known as: XARELTO Take 15 mg by mouth daily with supper.   torsemide 20 MG tablet Commonly known as: DEMADEX Take 20 mg by mouth daily.   zinc sulfate 220 (50 Zn) MG capsule Take 1 capsule (220 mg total) by mouth daily.            Durable Medical Equipment  (From admission, onward)         Start     Ordered   01/28/20 1113  For home use only DME oxygen  Once    Question Answer Comment  Length of Need Lifetime   Mode or (Route) Nasal cannula   Liters per Minute 2   Frequency Continuous (stationary and portable oxygen unit needed)   Oxygen conserving device Yes   Oxygen delivery system Gas      01/28/20 1112   01/28/20 1051  For home use only DME oxygen  Once    Question Answer Comment  Length of Need Lifetime   Mode or (Route) Nasal cannula   Liters per Minute 2   Frequency Only at night (stationary unit needed)   Oxygen conserving device Yes   Oxygen delivery system Gas      01/28/20 1051         Unable to set up home oxygen.  DISCHARGE INSTRUCTIONS:   Follow-up PMD 5 days Follow-up cardiology 5 days to start amiodarone taper  If you experience worsening of your admission symptoms, develop shortness of breath, life threatening  emergency, suicidal or homicidal thoughts you must seek medical attention immediately by calling 911 or calling your MD immediately  if symptoms less severe.  You Must read complete instructions/literature along with all the possible adverse reactions/side effects for all the Medicines you take and that have been prescribed to you. Take any new Medicines after you have completely understood and accept all the possible adverse reactions/side effects.   Please note  You were cared for by a hospitalist during your hospital stay. If you have any questions about your discharge medications or the care you received while you were in the hospital after you are discharged, you can call the unit and asked to speak with  the hospitalist on call if the hospitalist that took care of you is not available. Once you are discharged, your primary care physician will handle any further medical issues. Please note that NO REFILLS for any discharge medications will be authorized once you are discharged, as it is imperative that you return to your primary care physician (or establish a relationship with a primary care physician if you do not have one) for your aftercare needs so that they can reassess your need for medications and monitor your lab values.    Today   CHIEF COMPLAINT:   Chief Complaint  Patient presents with  . Shortness of Breath  . Failure To Thrive  . Dizziness    HISTORY OF PRESENT ILLNESS:  Stanley Marks  is a 79 y.o. male came in with shortness of breath and dizziness   VITAL SIGNS:  Blood pressure (!) 142/90, pulse 68, temperature 97.7 F (36.5 C), temperature source Oral, resp. rate 20, height 6\' 2"  (1.88 m), weight 135.2 kg, SpO2 93 %.    PHYSICAL EXAMINATION:  GENERAL:  79 y.o.-year-old patient lying in the bed with no acute distress.  EYES: Pupils equal, round, reactive to light and accommodation. No scleral icterus. Extraocular muscles intact.  HEENT: Head atraumatic,  normocephalic. Oropharynx and nasopharynx clear.  NECK:  Supple, no jugular venous distention. No thyroid enlargement, no tenderness.  LUNGS: Decreased breath sounds bilaterally, no wheezing, rales,rhonchi or crepitation. No use of accessory muscles of respiration.  CARDIOVASCULAR: S1, S2 irregularly irregular. No murmurs, rubs, or gallops.  ABDOMEN: Soft, non-tender, non-distended. Bowel sounds present. No organomegaly or mass.  EXTREMITIES: No pedal edema, cyanosis, or clubbing.  NEUROLOGIC: Cranial nerves II through XII are intact. Muscle strength 5/5 in all extremities. Sensation intact. Gait not checked.  PSYCHIATRIC: The patient is alert and oriented x 3.  SKIN: Left ankle covered.  Bilateral lower extremity nonblanching darkish lesions bilaterally  DATA REVIEW:   CBC Recent Labs  Lab 01/27/20 0316  WBC 14.2*  HGB 16.9  HCT 53.0*  PLT 266    Chemistries  Recent Labs  Lab 01/26/20 0410 01/26/20 0410 01/27/20 0316 01/27/20 0316 01/28/20 0318  NA 139   < > 137   < > 138  K 4.7   < > 4.9   < > 4.9  CL 104   < > 101   < > 105  CO2 27   < > 27   < > 28  GLUCOSE 132*   < > 198*   < > 127*  BUN 26*   < > 33*   < > 31*  CREATININE 1.12   < > 1.17   < > 1.24  CALCIUM 8.7*   < > 8.8*   < > 8.7*  MG 2.5*  --   --   --   --   AST  --   --  39  --   --   ALT  --   --  98*  --   --   ALKPHOS  --   --  70  --   --   BILITOT  --   --  0.7  --   --    < > = values in this interval not displayed.     Microbiology Results  Results for orders placed or performed during the hospital encounter of 01/22/20  Respiratory Panel by RT PCR (Flu A&B, Covid) - Nasopharyngeal Swab     Status: Abnormal   Collection Time:  01/22/20  2:52 PM   Specimen: Nasopharyngeal Swab  Result Value Ref Range Status   SARS Coronavirus 2 by RT PCR POSITIVE (A) NEGATIVE Final    Comment: RESULT CALLED TO, READ BACK BY AND VERIFIED WITH: CHRISSY BRAND @1546  01/22/20 MJU (NOTE) SARS-CoV-2 target nucleic  acids are DETECTED. SARS-CoV-2 RNA is generally detectable in upper respiratory specimens  during the acute phase of infection. Positive results are indicative of the presence of the identified virus, but do not rule out bacterial infection or co-infection with other pathogens not detected by the test. Clinical correlation with patient history and other diagnostic information is necessary to determine patient infection status. The expected result is Negative. Fact Sheet for Patients:  PinkCheek.be Fact Sheet for Healthcare Providers: GravelBags.it This test is not yet approved or cleared by the [place] FDA and  has been authorized for detection and/or diagnosis of SARS-CoV-2 by FDA under an Emergency Use Authorization (EUA).  This EUA will remain in effect (meaning this test can be used) for t he duration of  the COVID-19 declaration under Section 564(b)(1) of the Act, 21 U.S.C. section 360bbb-3(b)(1), unless the authorization is terminated or revoked sooner.    Influenza A by PCR NEGATIVE NEGATIVE Final   Influenza B by PCR NEGATIVE NEGATIVE Final    Comment: (NOTE) The Xpert Xpress SARS-CoV-2/FLU/RSV assay is intended as an aid in  the diagnosis of influenza from Nasopharyngeal swab specimens and  should not be used as a sole basis for treatment. Nasal washings and  aspirates are unacceptable for Xpert Xpress SARS-CoV-2/FLU/RSV  testing. Fact Sheet for Patients: PinkCheek.be Fact Sheet for Healthcare Providers: GravelBags.it This test is not yet approved or cleared by the [place] FDA and  has been authorized for detection and/or diagnosis of SARS-CoV-2 by  FDA under an Emergency Use Authorization (EUA). This EUA will remain  in effect (meaning this test can be used) for the duration of the  Covid-19 declaration under Section 564(b)(1) of the Act, 21   U.S.C. section 360bbb-3(b)(1), unless the authorization is  terminated or revoked. Performed at [facility], [address] [place], [place] [address]   MRSA PCR Screening     Status: None   Collection Time: 01/22/20  9:17 PM   Specimen: Nasopharyngeal  Result Value Ref Range Status   MRSA by PCR NEGATIVE NEGATIVE Final    Comment:        The GeneXpert MRSA Assay (FDA approved for NASAL specimens only), is one component of a comprehensive MRSA colonization surveillance program. It is not intended to diagnose MRSA infection nor to guide or monitor treatment for MRSA infections. Performed at [facility], [address] [place], [place] [address]   CULTURE, BLOOD (ROUTINE X 2) w Reflex to ID Panel     Status: None (Preliminary result)   Collection Time: 01/24/20  9:52 AM   Specimen: BLOOD  Result Value Ref Range Status   Specimen Description BLOOD RT HAND  Final   Special Requests   Final    BOTTLES DRAWN AEROBIC AND ANAEROBIC Blood Culture adequate volume   Culture   Final    NO GROWTH 4 DAYS Performed at [facility], [address] [place], [place] [address]    Report Status PENDING  Incomplete  Urine Culture     Status: None   Collection Time: 01/24/20 12:14 PM   Specimen: Urine, Clean Catch  Result Value Ref Range Status   Specimen Description   Final  URINE, CLEAN CATCH Performed at [facility], [address], [place], [place] [address]    Special Requests   Final    NONE Performed at [facility], [address] [place], [place] [address]    Culture   Final    NO GROWTH Performed at [facility], [place] [address], [place], [place] [address]    Report Status 01/25/2020 FINAL  Final  CULTURE, BLOOD (ROUTINE X 2) w Reflex to ID Panel     Status: None (Preliminary result)   Collection Time: 01/24/20 12:47 PM   Specimen: BLOOD  Result Value Ref Range Status   Specimen Description BLOOD LEFT  ANTECUBITAL  Final   Special Requests   Final    BOTTLES DRAWN AEROBIC AND ANAEROBIC Blood Culture adequate volume   Culture   Final    NO GROWTH 4 DAYS Performed at [facility], [address] [place], [place] [address]    Report Status PENDING  Incomplete    Management plans discussed with the patient, and he is a in agreement.  CODE STATUS:     Code Status Orders  (From admission, onward)         Start     Ordered   01/22/20 2110  Full code  Continuous     01/22/20 2109        Code Status History    Date Active Date Inactive Code Status Order ID Comments User Context   12/22/2019 1759 12/26/2019 1912 Full Code [id number]  Barb Merino, MD Inpatient   12/22/2019 1359 12/22/2019 1759 DNR [id number]  Barb Merino, MD Inpatient   12/22/2019 0826 12/22/2019 1339 DNR [id number]  Loletha Grayer, MD ED   12/22/2019 0511 12/22/2019 0825 Full Code [id number]  Athena Masse, MD ED   01/25/2017 0105 01/28/2017 1434 Full Code [id number]  Hugelmeyer, Ubaldo Glassing, DO Inpatient   Advance Care Planning Activity      TOTAL TIME TAKING CARE OF THIS PATIENT: 35 minutes.    Loletha Grayer M.D on 01/28/2020 at 3:40 PM  Between 7am to 6pm - Pager - [phone]  After 6pm go to [email or web address] - password [email or web address]  [facility]  CC: Primary care physician; Perrin Maltese, MD

## 2020-01-28 NOTE — Progress Notes (Signed)
SUBJECTIVE: Patient remains in A fib, but with appropriate rate control 80-110. Per RN note, patient had an 2.4 second pause which was asymptomatic. Patient states he just wants to go home and is frustrated. Patient further educated on his situation.  Vitals:   01/28/20 0000 01/28/20 0200 01/28/20 0400 01/28/20 0500  BP: 129/86 109/67 116/90   Pulse:  62 74 63  Resp:      Temp:   97.9 F (36.6 C)   TempSrc:   Oral   SpO2: 94% 93% 95% 95%  Weight:    135.2 kg  Height:        Intake/Output Summary (Last 24 hours) at 01/28/2020 1009 Last data filed at 01/28/2020 0500 Gross per 24 hour  Intake --  Output 1570 ml  Net -1570 ml    LABS: Basic Metabolic Panel: Recent Labs    01/26/20 0410 01/26/20 0410 01/27/20 0316 01/28/20 0318  NA 139   < > 137 138  K 4.7   < > 4.9 4.9  CL 104   < > 101 105  CO2 27   < > 27 28  GLUCOSE 132*   < > 198* 127*  BUN 26*   < > 33* 31*  CREATININE 1.12   < > 1.17 1.24  CALCIUM 8.7*   < > 8.8* 8.7*  MG 2.5*  --   --   --   PHOS 3.2  --   --   --    < > = values in this interval not displayed.   Liver Function Tests: Recent Labs    01/27/20 0316  AST 39  ALT 98*  ALKPHOS 70  BILITOT 0.7  PROT 6.6  ALBUMIN 3.2*   No results for input(s): LIPASE, AMYLASE in the last 72 hours. CBC: Recent Labs    01/26/20 0410 01/27/20 0316  WBC 15.0* 14.2*  NEUTROABS 13.3*  --   HGB 15.9 16.9  HCT 49.8 53.0*  MCV 96.1 94.8  PLT 283 266   Cardiac Enzymes: No results for input(s): CKTOTAL, CKMB, CKMBINDEX, TROPONINI in the last 72 hours. BNP: Invalid input(s): POCBNP D-Dimer: No results for input(s): DDIMER in the last 72 hours. Hemoglobin A1C: No results for input(s): HGBA1C in the last 72 hours. Fasting Lipid Panel: No results for input(s): CHOL, HDL, LDLCALC, TRIG, CHOLHDL, LDLDIRECT in the last 72 hours. Thyroid Function Tests: No results for input(s): TSH, T4TOTAL, T3FREE, THYROIDAB in the last 72 hours.  Invalid input(s):  FREET3 Anemia Panel: No results for input(s): VITAMINB12, FOLATE, FERRITIN, TIBC, IRON, RETICCTPCT in the last 72 hours.   PHYSICAL EXAM General: Well developed, well nourished, in no acute distress HEENT:  Normocephalic and atramatic Neck:  No JVD.  Lungs: Clear bilaterally to auscultation and percussion. Heart: HRRR . Normal S1 and S2 without gallops or murmurs.  Abdomen: Bowel sounds are positive, abdomen soft and non-tender  Msk:  Back normal, normal gait. Normal strength and tone for age. Extremities: No clubbing, cyanosis or edema.   Neuro: Alert and oriented X 3. Psych:  Good affect, responds appropriately  TELEMETRY: A fib  ASSESSMENT AND PLAN: Patient doing well with no complaints of chest pain or dyspnea. With the patient remianing in A fib, continue metoprolol succinate 50mg  BID for rate control. With Digoxin serum level 0.9, will add back Digoxin at 0.125 PO every other day and continue to monitor kidney function. Continue with PO amiodarone 400mg  BID with slow taper.   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

## 2020-01-28 NOTE — Progress Notes (Signed)
Paged E. Ouma, NP concerning pt having asymtpomatic 2.4 sec pause on telemetry at 0459

## 2020-01-28 NOTE — Progress Notes (Signed)
Pt refuses LLE wound care. Explained [place] Nurse recom. & pt refuses, he states outpatient wound changes are twice a week.

## 2020-01-28 NOTE — Progress Notes (Signed)
Patient ID: Stanley Marks, male   DOB: 1941/01/13, 79 y.o.   MRN: [id number]  Oxygen placed at night because he desaturates to 87%. Will set up for night time oxygen  Dr Leslye Peer

## 2020-01-29 LAB — CULTURE, BLOOD (ROUTINE X 2)
Culture: NO GROWTH
Culture: NO GROWTH
Special Requests: ADEQUATE
Special Requests: ADEQUATE

## 2020-02-02 ENCOUNTER — Other Ambulatory Visit: Payer: Self-pay

## 2020-02-02 ENCOUNTER — Encounter: Payer: Medicare Other | Admitting: Physician Assistant

## 2020-02-02 DIAGNOSIS — I5042 Chronic combined systolic (congestive) and diastolic (congestive) heart failure: Secondary | ICD-10-CM | POA: Diagnosis not present

## 2020-02-02 DIAGNOSIS — I872 Venous insufficiency (chronic) (peripheral): Secondary | ICD-10-CM | POA: Diagnosis not present

## 2020-02-02 DIAGNOSIS — L97222 Non-pressure chronic ulcer of left calf with fat layer exposed: Secondary | ICD-10-CM | POA: Diagnosis present

## 2020-02-02 DIAGNOSIS — I890 Lymphedema, not elsewhere classified: Secondary | ICD-10-CM | POA: Diagnosis not present

## 2020-02-02 DIAGNOSIS — J449 Chronic obstructive pulmonary disease, unspecified: Secondary | ICD-10-CM | POA: Diagnosis not present

## 2020-02-02 DIAGNOSIS — L97822 Non-pressure chronic ulcer of other part of left lower leg with fat layer exposed: Secondary | ICD-10-CM | POA: Diagnosis not present

## 2020-02-02 DIAGNOSIS — F17200 Nicotine dependence, unspecified, uncomplicated: Secondary | ICD-10-CM | POA: Diagnosis not present

## 2020-02-02 DIAGNOSIS — I110 Hypertensive heart disease with heart failure: Secondary | ICD-10-CM | POA: Diagnosis not present

## 2020-02-02 NOTE — Progress Notes (Addendum)
ESGAR, WAYMIRE ([id number]) Visit Report for 02/02/2020 Chief Complaint Document Details Patient Name: Stanley Marks Date of Service: 02/02/2020 12:00 PM Medical Record Number: [id number] Patient Account Number: [id number] Date of Birth/Sex: 1941/11/09 (78 y.o. M) Treating RN: Montey Hora Primary Care Provider: Lamonte Sakai Other Clinician: Referring Provider: Lamonte Sakai Treating Provider/Extender: Melburn Hake, Jamahl Lemmons Weeks in Treatment: 2 Information Obtained from: Patient Chief Complaint Left LE Ulcers Electronic Signature(s) Signed: 02/02/2020 12:01:55 PM By: Worthy Keeler PA-C Entered By: Worthy Keeler on 02/02/2020 12:01:54 Stanley Marks ([id number]) -------------------------------------------------------------------------------- HPI Details Patient Name: Stanley Marks Date of Service: 02/02/2020 12:00 PM Medical Record Number: [id number] Patient Account Number: [id number] Date of Birth/Sex: May 23, 1941 (78 y.o. M) Treating RN: Montey Hora Primary Care Provider: Lamonte Sakai Other Clinician: Referring Provider: Lamonte Sakai Treating Provider/Extender: Melburn Hake, Tejay Hubert Weeks in Treatment: 2 History of Present Illness HPI Description: Pleasant 79 year old with history of chronic venous insufficiency and hypertension. No History of diabetes or peripheral arterial disease. He says that he stopped taking his "fluid pill" so that he wouldn't have to urinate as often. He subsequently developed bilateral lower extremity swelling and left calf ulcerations in late August 2016. Status post bilateral lower extremity endovenous ablation over 5 years ago in [place]. He does not regularly wear compression stockings. Denies any history of DVT. He is ambulating per his baseline. No claudication or rest pain. Tolerated 3 layer compression bandage with Prisma with significant improvement. Recently declined compression bandages and has been using a Tubigrip for edema control. He returns to  clinic for follow-up and is without complaints. No significant pain. No fever or chills. No drainage. READMISSION 10/14/2019 This is a 79 year old man who is in this clinic for years ago with a venous insufficiency wound on the left leg. He was discharged with compression stockings but he does not wear them. He states he developed a blister on the right anterior tibial area in October. He was in the ER on 10/26 after falling and noted to have a wound on his right leg/shin although the patient states the blister was already present. He was felt possibly to be mildly dehydrated he declined IV fluid. X-ray did not show any osseous abnormalities however he was noted to have severe right knee osteoarthritis. The patient has known chronic venous insufficiency. He states he had ablations in his bilateral lower legs many years ago. This seems well verified from the history noted above. He is not a diabetic and does not have known PAD. Past medical history; no diabetes, atrial fibrillation on Xarelto, history of basal cell cancer, COPD continued smoker, hypertension, peripheral vascular disease. ABIs in our clinic were 1.36 on the right and 1.24 on the left 11/11; patient with a venous insufficiency wound on the right anterior lower leg. Admitted to clinic last week. He complains bitterly about the 3 layer compression we put on him stating it was uncomfortable he could not sleep. He has previously not tolerated stockings very well as well. His ABIs in this clinic were noncompressible however his dorsalis pedis and posterior tibial pulses are easily palpable. He has decent edema control. We used Hydrofera Blue which apparently stuck to the wound. There are good improvements in wound dimensions 11/18; the patient's venous insufficiency wound on the right anterior lower leg is almost all epithelialized. However he arrives in the clinic with 2 skin tears on his forearm from a fall at noon yesterday 11/04/2019 on  evaluation today patient appears to be doing well with regard to his leg  ulcer as well as his right forearm ulcers. He has been tolerating the dressing changes without complication. Fortunately there is no sign of active infection at this time. No fevers, chills, nausea, vomiting, or diarrhea. I am extremely pleased with how things seem to be progressing based on what I am seeing today. 12/2; the patient's wound on the right anterior lower leg is healed. He has severe chronic venous insufficiency and has had previous ablations on this side. Previous ablations were done in [place]. He has not consistently worn compression stockings. He has a Tubigrip stocking on his left leg. The areas that he injured on his right dorsal forearm have some surface scabbing however I think this is on its way to closure I do not think independently they need to have him continue in this clinic. Readmission: 01/19/2020 upon evaluation today patient presents for reevaluation after having been seen last here in the clinic on November 11, 2019. He was readmitted last year on October 14, 2019 here in the clinic concerning right lower extremity ulcers. He had ABIs performed at that time that appeared to be doing well. Overall though he was only able to tolerate a Curlex and Coban wrap anything stronger was too much for him. He is still smoking unfortunately and this is an ongoing issue. He really has no plans to quit. He also does not wear compression socks he tells me that he cannot afford them. With that being said obviously I am not sure that this is really the reason behind why he does not like the compression socks my gut tells me he may have difficulty getting them on as well. Fortunately there is no signs of active infection at this time he does have a small ulcer on the anterior which actually began surface of his leg Christmas Eve 2020 he has larger wounds on the posterior surface of his leg which are larger and actually  began 2 weeks ago. He does have a history of congestive heart failure as noted by documentation. He also has venous insufficiency which is chronic as well as lymphedema. He also has hypertension. 02/02/2020 patient presents today for follow-up though I have not seen him last since 01/19/2020. He ended up in the hospital secondary to having issues with Covid. He was apparently very sick and not moving around a lot during that time. With that being said upon inspection today his wound actually appears to be doing significantly better compared to the previous evaluation even which is excellent news. The previous evaluation was the initial here in the clinic and at that time the wounds appeared to be doing much more severely in my opinion. Right now he has a lot of new skin growth which is Stanley Marks, Stanley Marks ([id number]) excellent news. With that being said the wounds are also measuring significantly smaller and my hope is that although the hospital stay obviously was not ideal that it is good to help these areas to heal much more effectively and quickly. We were using Curlex and Coban wraps for him Which is about all he can really tolerate. Electronic Signature(s) Signed: 02/12/2020 5:10:58 PM By: Gretta Cool, BSN, RN, CWS, Kim RN, BSN Signed: 02/15/2020 8:50:54 AM By: Worthy Keeler PA-C Previous Signature: 02/03/2020 7:56:33 AM Version By: Worthy Keeler PA-C Entered By: Gretta Cool BSN, RN, CWS, Kim on 02/12/2020 17:10:58 Stanley Marks, Stanley Marks ([id number]) -------------------------------------------------------------------------------- Physical Exam Details Patient Name: Stanley Marks Date of Service: 02/02/2020 12:00 PM Medical Record Number: [id number] Patient Account Number: [id number] Date of Birth/Sex:  1941-09-30 (79 y.o. M) Treating RN: Montey Hora Primary Care Provider: Lamonte Sakai Other Clinician: Referring Provider: Lamonte Sakai Treating Provider/Extender: Melburn Hake, Chau Sawin Weeks in Treatment:  2 Constitutional Well-nourished and well-hydrated in no acute distress. Respiratory normal breathing without difficulty. Psychiatric this patient is able to make decisions and demonstrates good insight into disease process. Alert and Oriented x 3. pleasant and cooperative. Notes Patient's wound bed currently showed signs of excellent granulation with excellent epithelization he had multiple skin islands scattered throughout the wound surfaces upon inspection today and overall seems to be doing quite well. I am extremely happy with how things stand at this point. Electronic Signature(s) Signed: 02/03/2020 7:56:54 AM By: Worthy Keeler PA-C Entered By: Worthy Keeler on 02/03/2020 07:56:54 Stanley Marks ([id number]) -------------------------------------------------------------------------------- Physician Orders Details Patient Name: Stanley Marks Date of Service: 02/02/2020 12:00 PM Medical Record Number: [id number] Patient Account Number: [id number] Date of Birth/Sex: 05/25/1941 (78 y.o. M) Treating RN: Montey Hora Primary Care Provider: Lamonte Sakai Other Clinician: Referring Provider: Lamonte Sakai Treating Provider/Extender: Melburn Hake, Loriene Taunton Weeks in Treatment: 2 Verbal / Phone Orders: No Diagnosis Coding ICD-10 Coding Code Description I87.2 Venous insufficiency (chronic) (peripheral) I89.0 Lymphedema, not elsewhere classified L97.822 Non-pressure chronic ulcer of other part of left lower leg with fat layer exposed I10 Essential (primary) hypertension I50.42 Chronic combined systolic (congestive) and diastolic (congestive) heart failure Wound Cleansing Wound #6 Left,Proximal,Posterior Lower Leg o Clean wound with Normal Saline. o Cleanse wound with mild soap and water Wound #7 Left,Distal,Posterior Lower Leg o Clean wound with Normal Saline. o Cleanse wound with mild soap and water Skin Barriers/Peri-Wound Care Wound #6 Left,Proximal,Posterior Lower Leg o  Moisturizing lotion Wound #7 Left,Distal,Posterior Lower Leg o Moisturizing lotion Primary Wound Dressing Wound #6 Left,Proximal,Posterior Lower Leg o Silver Alginate Wound #7 Left,Distal,Posterior Lower Leg o Silver Alginate Secondary Dressing Wound #6 Left,Proximal,Posterior Lower Leg o ABD pad o XtraSorb - if needed for drainage Wound #7 Left,Distal,Posterior Lower Leg o ABD pad o XtraSorb - if needed for drainage Dressing Change Frequency o Change dressing every week Follow-up Appointments o Return Appointment in 1 week. Edema Control Wound #6 Left,Proximal,Posterior Lower Leg o Kerlix and Coban - Left Lower Extremity - anchor with unna Wound #7 Left,Distal,Posterior Lower Leg o Kerlix and Coban - Left Lower Extremity - anchor with DEYVI, FICCO ([id number]) Electronic Signature(s) Signed: 02/02/2020 1:15:24 PM By: Montey Hora Signed: 02/03/2020 8:00:28 AM By: Worthy Keeler PA-C Entered By: Montey Hora on 02/02/2020 12:31:22 Stanley Marks ([id number]) -------------------------------------------------------------------------------- Problem List Details Patient Name: Stanley Marks Date of Service: 02/02/2020 12:00 PM Medical Record Number: [id number] Patient Account Number: [id number] Date of Birth/Sex: 11/17/1941 (78 y.o. M) Treating RN: Montey Hora Primary Care Provider: Lamonte Sakai Other Clinician: Referring Provider: Lamonte Sakai Treating Provider/Extender: Melburn Hake, Jorge Amparo Weeks in Treatment: 2 Active Problems ICD-10 Evaluated Encounter Code Description Active Date Today Diagnosis I87.2 Venous insufficiency (chronic) (peripheral) 01/19/2020 No Yes I89.0 Lymphedema, not elsewhere classified 01/19/2020 No Yes L97.822 Non-pressure chronic ulcer of other part of left lower leg with fat layer 01/19/2020 No Yes exposed [place] (primary) hypertension 01/19/2020 No Yes I50.42 Chronic combined systolic (congestive) and diastolic  (congestive) heart 01/19/2020 No Yes failure Inactive Problems Resolved Problems Electronic Signature(s) Signed: 02/02/2020 12:01:48 PM By: Worthy Keeler PA-C Entered By: Worthy Keeler on 02/02/2020 12:01:48 Stanley Marks ([id number]) -------------------------------------------------------------------------------- Progress Note Details Patient Name: Stanley Marks Date of Service: 02/02/2020 12:00 PM Medical Record Number: [id number] Patient Account Number: [id number] Date of Birth/Sex: 23-Mar-1941 (  79 y.o. M) Treating RN: Montey Hora Primary Care Provider: Lamonte Sakai Other Clinician: Referring Provider: Lamonte Sakai Treating Provider/Extender: Melburn Hake, Asante Ritacco Weeks in Treatment: 2 Subjective Chief Complaint Information obtained from Patient Left LE Ulcers History of Present Illness (HPI) Pleasant 79 year old with history of chronic venous insufficiency and hypertension. No History of diabetes or peripheral arterial disease. He says that he stopped taking his "fluid pill" so that he wouldn't have to urinate as often. He subsequently developed bilateral lower extremity swelling and left calf ulcerations in late August 2016. Status post bilateral lower extremity endovenous ablation over 5 years ago in [place]. He does not regularly wear compression stockings. Denies any history of DVT. He is ambulating per his baseline. No claudication or rest pain. Tolerated 3 layer compression bandage with Prisma with significant improvement. Recently declined compression bandages and has been using a Tubigrip for edema control. He returns to clinic for follow-up and is without complaints. No significant pain. No fever or chills. No drainage. READMISSION 10/14/2019 This is a 79 year old man who is in this clinic for years ago with a venous insufficiency wound on the left leg. He was discharged with compression stockings but he does not wear them. He states he developed a blister on the right  anterior tibial area in October. He was in the ER on 10/26 after falling and noted to have a wound on his right leg/shin although the patient states the blister was already present. He was felt possibly to be mildly dehydrated he declined IV fluid. X-ray did not show any osseous abnormalities however he was noted to have severe right knee osteoarthritis. The patient has known chronic venous insufficiency. He states he had ablations in his bilateral lower legs many years ago. This seems well verified from the history noted above. He is not a diabetic and does not have known PAD. Past medical history; no diabetes, atrial fibrillation on Xarelto, history of basal cell cancer, COPD continued smoker, hypertension, peripheral vascular disease. ABIs in our clinic were 1.36 on the right and 1.24 on the left 11/11; patient with a venous insufficiency wound on the right anterior lower leg. Admitted to clinic last week. He complains bitterly about the 3 layer compression we put on him stating it was uncomfortable he could not sleep. He has previously not tolerated stockings very well as well. His ABIs in this clinic were noncompressible however his dorsalis pedis and posterior tibial pulses are easily palpable. He has decent edema control. We used Hydrofera Blue which apparently stuck to the wound. There are good improvements in wound dimensions 11/18; the patient's venous insufficiency wound on the right anterior lower leg is almost all epithelialized. However he arrives in the clinic with 2 skin tears on his forearm from a fall at noon yesterday 11/04/2019 on evaluation today patient appears to be doing well with regard to his leg ulcer as well as his right forearm ulcers. He has been tolerating the dressing changes without complication. Fortunately there is no sign of active infection at this time. No fevers, chills, nausea, vomiting, or diarrhea. I am extremely pleased with how things seem to be progressing  based on what I am seeing today. 12/2; the patient's wound on the right anterior lower leg is healed. He has severe chronic venous insufficiency and has had previous ablations on this side. Previous ablations were done in [place]. He has not consistently worn compression stockings. He has a Tubigrip stocking on his left leg. The areas that he injured on  his right dorsal forearm have some surface scabbing however I think this is on its way to closure I do not think independently they need to have him continue in this clinic. Readmission: 01/19/2020 upon evaluation today patient presents for reevaluation after having been seen last here in the clinic on November 11, 2019. He was readmitted last year on October 14, 2019 here in the clinic concerning right lower extremity ulcers. He had ABIs performed at that time that appeared to be doing well. Overall though he was only able to tolerate a Curlex and Coban wrap anything stronger was too much for him. He is still smoking unfortunately and this is an ongoing issue. He really has no plans to quit. He also does not wear compression socks he tells me that he cannot afford them. With that being said obviously I am not sure that this is really the reason behind why he does not like the compression socks my gut tells me he may have difficulty getting them on as well. Fortunately there is no signs of active infection at this time he does have a small ulcer on the anterior which actually began surface of his leg Christmas Eve 2020 he has larger wounds on the posterior surface of his leg which are larger and actually began 2 weeks ago. He does have a history of congestive heart failure as noted by documentation. He also has venous insufficiency which is chronic as well as lymphedema. He also has hypertension. Stanley Marks, Stanley Marks ([id number]) 02/02/2020 patient presents today for follow-up though I have not seen him last since 01/19/2020. He ended up in the hospital secondary  to having issues with Covid. He was apparently very sick and not moving around a lot during that time. With that being said upon inspection today his wound actually appears to be doing significantly better compared to the previous evaluation even which is excellent news. The previous evaluation was the initial here in the clinic and at that time the wounds appeared to be doing much more severely in my opinion. Right now he has a lot of new skin growth which is excellent news. With that being said the wounds are also measuring significantly smaller and my hope is that although the hospital stay obviously was not ideal that it is good to help these areas to heal much more effectively and quickly. We were using Curlex and Coban wraps for him Which is about all he can really tolerate. Objective Constitutional Well-nourished and well-hydrated in no acute distress. Vitals Time Taken: 12:00 PM, Height: 74 in, Weight: 290 lbs, BMI: 37.2, Temperature: 98.3 F, Pulse: 77 bpm, Respiratory Rate: 18 breaths/min, Blood Pressure: 115/68 mmHg. Respiratory normal breathing without difficulty. Psychiatric this patient is able to make decisions and demonstrates good insight into disease process. Alert and Oriented x 3. pleasant and cooperative. General Notes: Patient's wound bed currently showed signs of excellent granulation with excellent epithelization he had multiple skin islands scattered throughout the wound surfaces upon inspection today and overall seems to be doing quite well. I am extremely happy with how things stand at this point. Integumentary (Hair, Skin) Wound #6 status is Open. Original cause of wound was Blister. The wound is located on the Left,Proximal,Posterior Lower Leg. The wound measures 5cm length x 3.5cm width x 0.1cm depth; 13.744cm^2 area and 1.374cm^3 volume. There is Fat Layer (Subcutaneous Tissue) Exposed exposed. There is no tunneling or undermining noted. There is a medium amount  of serosanguineous drainage noted. The wound margin is flat  and intact. There is large (67-100%) red granulation within the wound bed. There is a small (1-33%) amount of necrotic tissue within the wound bed including Adherent Slough. Wound #7 status is Open. Original cause of wound was Blister. The wound is located on the Left,Distal,Posterior Lower Leg. The wound measures 3.8cm length x 3cm width x 0.1cm depth; 8.954cm^2 area and 0.895cm^3 volume. There is Fat Layer (Subcutaneous Tissue) Exposed exposed. There is no tunneling or undermining noted. There is a medium amount of serosanguineous drainage noted. The wound margin is flat and intact. There is large (67-100%) pink granulation within the wound bed. There is a small (1-33%) amount of necrotic tissue within the wound bed including Adherent Slough. Wound #8 status is Healed - Epithelialized. Original cause of wound was Blister. The wound is located on the Left,Anterior Lower Leg. The wound measures 0cm length x 0cm width x 0cm depth; 0cm^2 area and 0cm^3 volume. Assessment Active Problems ICD-10 Venous insufficiency (chronic) (peripheral) Lymphedema, not elsewhere classified Non-pressure chronic ulcer of other part of left lower leg with fat layer exposed Essential (primary) hypertension Chronic combined systolic (congestive) and diastolic (congestive) heart failure Stanley Marks, Stanley Marks ([id number]) Plan Wound Cleansing: Wound #6 Left,Proximal,Posterior Lower Leg: Clean wound with Normal Saline. Cleanse wound with mild soap and water Wound #7 Left,Distal,Posterior Lower Leg: Clean wound with Normal Saline. Cleanse wound with mild soap and water Skin Barriers/Peri-Wound Care: Wound #6 Left,Proximal,Posterior Lower Leg: Moisturizing lotion Wound #7 Left,Distal,Posterior Lower Leg: Moisturizing lotion Primary Wound Dressing: Wound #6 Left,Proximal,Posterior Lower Leg: Silver Alginate Wound #7 Left,Distal,Posterior Lower Leg: Silver  Alginate Secondary Dressing: Wound #6 Left,Proximal,Posterior Lower Leg: ABD pad XtraSorb - if needed for drainage Wound #7 Left,Distal,Posterior Lower Leg: ABD pad XtraSorb - if needed for drainage Dressing Change Frequency: Change dressing every week Follow-up Appointments: Return Appointment in 1 week. Edema Control: Wound #6 Left,Proximal,Posterior Lower Leg: Kerlix and Coban - Left Lower Extremity - anchor with unna Wound #7 Left,Distal,Posterior Lower Leg: Kerlix and Coban - Left Lower Extremity - anchor with unna 1. My suggestion at this time is good to be that we go ahead and continue to manage the patient's wounds I think we can use a silver alginate dressing I think this will be excellent for him and we can use Xtrasorb over top although that may not be necessary going forward. We will see how much this drains. 2. I am also going to suggest that we continue with the Curlex and Coban wrap for the bilateral lower extremities this is about all the patient can handle and he seems to do okay with that. 3. I am to recommend the patient elevate his legs is much as possible. We will see patient back for reevaluation in 1 week here in the clinic. If anything worsens or changes patient will contact our office for additional recommendations. Electronic Signature(s) Signed: 02/12/2020 5:11:12 PM By: Gretta Cool, BSN, RN, CWS, Kim RN, BSN Signed: 02/15/2020 8:50:54 AM By: Worthy Keeler PA-C Previous Signature: 02/03/2020 7:59:50 AM Version By: Worthy Keeler PA-C Entered By: Gretta Cool BSN, RN, CWS, Kim on 02/12/2020 17:11:12 Stanley Marks ([id number]) -------------------------------------------------------------------------------- SuperBill Details Patient Name: Stanley Marks Date of Service: 02/02/2020 Medical Record Number: [id number] Patient Account Number: [id number] Date of Birth/Sex: Apr 24, 1941 (78 y.o. M) Treating RN: Montey Hora Primary Care Provider: Lamonte Sakai Other  Clinician: Referring Provider: Lamonte Sakai Treating Provider/Extender: Melburn Hake, Garrit Marrow Weeks in Treatment: 2 Diagnosis Coding ICD-10 Codes Code Description I87.2 Venous insufficiency (chronic) (peripheral) I89.0 Lymphedema, not elsewhere classified  [id number] Non-pressure chronic ulcer of other part of left lower leg with fat layer exposed I10 Essential (primary) hypertension I50.42 Chronic combined systolic (congestive) and diastolic (congestive) heart failure Facility Procedures CPT4 Code: [id number] Description: 99214 - WOUND CARE VISIT-LEV 4 EST PT Modifier: Quantity: 1 Physician Procedures CPT4 Code: [id number] Description: [id number] - WC PHYS LEVEL 3 - EST PT Modifier: Quantity: 1 CPT4 Code: Description: ICD-10 Diagnosis Description I87.2 Venous insufficiency (chronic) (peripheral) 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

## 2020-02-02 NOTE — Progress Notes (Addendum)
MILLAN, OTTS ([id number]) Visit Report for 02/02/2020 Arrival Information Details Patient Name: Stanley Marks Date of Service: 02/02/2020 12:00 PM Medical Record Number: [id number] Patient Account Number: [id number] Date of Birth/Sex: [birth year] (78 y.o. M) Treating RN: Montey Hora Primary Care Tea Collums: Lamonte Sakai Other Clinician: Referring Laisha Rau: Lamonte Sakai Treating Ridley Schewe/Extender: Melburn Hake, HOYT Weeks in Treatment: 2 Visit Information History Since Last Visit Added or deleted any medications: No Patient Arrived: Wheel Chair Any new allergies or adverse reactions: No Arrival Time: 12:02 Had a fall or experienced change in No Accompanied By: son activities of daily living that may affect Transfer Assistance: None risk of falls: Patient Identification Verified: Yes Signs or symptoms of abuse/neglect since last visito No Secondary Verification Process Completed: Yes Hospitalized since last visit: No Implantable device outside of the clinic excluding No cellular tissue based products placed in the center since last visit: Has Dressing in Place as Prescribed: Yes Pain Present Now: No Electronic Signature(s) Signed: 02/02/2020 12:54:44 PM By: Lorine Bears RCP, RRT, CHT Entered By: Lorine Bears on 02/02/2020 12:04:03 Stanley Marks ([id number]) -------------------------------------------------------------------------------- Clinic Level of Care Assessment Details Patient Name: Stanley Marks Date of Service: 02/02/2020 12:00 PM Medical Record Number: [id number] Patient Account Number: [id number] Date of Birth/Sex: Sep 03, 1941 (78 y.o. M) Treating RN: Montey Hora Primary Care Latia Mataya: Lamonte Sakai Other Clinician: Referring Novalee Horsfall: Lamonte Sakai Treating Skylan Gift/Extender: Melburn Hake, HOYT Weeks in Treatment: 2 Clinic Level of Care Assessment Items TOOL 4 Quantity Score []  - Use when only an EandM is performed on FOLLOW-UP visit  0 ASSESSMENTS - Nursing Assessment / Reassessment X - Reassessment of Co-morbidities (includes updates in patient status) 1 10 X- 1 5 Reassessment of Adherence to Treatment Plan ASSESSMENTS - Wound and Skin Assessment / Reassessment []  - Simple Wound Assessment / Reassessment - one wound 0 X- 2 5 Complex Wound Assessment / Reassessment - multiple wounds []  - 0 Dermatologic / Skin Assessment (not related to wound area) ASSESSMENTS - Focused Assessment X - Circumferential Edema Measurements - multi extremities 1 5 []  - 0 Nutritional Assessment / Counseling / Intervention X- 1 5 Lower Extremity Assessment (monofilament, tuning fork, pulses) []  - 0 Peripheral Arterial Disease Assessment (using hand held doppler) ASSESSMENTS - Ostomy and/or Continence Assessment and Care []  - Incontinence Assessment and Management 0 []  - 0 Ostomy Care Assessment and Management (repouching, etc.) PROCESS - Coordination of Care X - Simple Patient / Family Education for ongoing care 1 15 []  - 0 Complex (extensive) Patient / Family Education for ongoing care X- 1 10 Staff obtains Programmer, systems, Records, Test Results / Process Orders []  - 0 Staff telephones HHA, Nursing Homes / Clarify orders / etc []  - 0 Routine Transfer to another Facility (non-emergent condition) []  - 0 Routine Hospital Admission (non-emergent condition) []  - 0 New Admissions / Biomedical engineer / Ordering NPWT, Apligraf, etc. []  - 0 Emergency Hospital Admission (emergent condition) X- 1 10 Simple Discharge Coordination []  - 0 Complex (extensive) Discharge Coordination PROCESS - Special Needs []  - Pediatric / Minor Patient Management 0 []  - 0 Isolation Patient Management []  - 0 Hearing / Language / Visual special needs []  - 0 Assessment of Community assistance (transportation, D/C planning, etc.) [facility], [place] ([id number]) []  - 0 Additional assistance / Altered mentation []  - 0 Support Surface(s) Assessment (bed,  cushion, seat, etc.) INTERVENTIONS - Wound Cleansing / Measurement []  - Simple Wound Cleansing - one wound 0 X- 2 5 Complex Wound Cleansing - multiple wounds X- 1 5 Wound Imaging (  photographs - any number of wounds) []  - 0 Wound Tracing (instead of photographs) []  - 0 Simple Wound Measurement - one wound X- 2 5 Complex Wound Measurement - multiple wounds INTERVENTIONS - Wound Dressings []  - Small Wound Dressing one or multiple wounds 0 []  - 0 Medium Wound Dressing one or multiple wounds X- 1 20 Large Wound Dressing one or multiple wounds []  - 0 Application of Medications - topical []  - 0 Application of Medications - injection INTERVENTIONS - Miscellaneous []  - External ear exam 0 []  - 0 Specimen Collection (cultures, biopsies, blood, body fluids, etc.) []  - 0 Specimen(s) / Culture(s) sent or taken to Lab for analysis []  - 0 Patient Transfer (multiple staff / Civil Service fast streamer / Similar devices) []  - 0 Simple Staple / Suture removal (25 or less) []  - 0 Complex Staple / Suture removal (26 or more) []  - 0 Hypo / Hyperglycemic Management (close monitor of Blood Glucose) []  - 0 Ankle / Brachial Index (ABI) - do not check if billed separately X- 1 5 Vital Signs Has the patient been seen at the hospital within the last three years: Yes Total Score: 120 Level Of Care: New/Established - Level 4 Electronic Signature(s) Signed: 02/02/2020 1:15:24 PM By: Montey Hora Entered By: Montey Hora on 02/02/2020 12:31:53 Stanley Marks ([id number]) -------------------------------------------------------------------------------- Encounter Discharge Information Details Patient Name: Stanley Marks Date of Service: 02/02/2020 12:00 PM Medical Record Number: [id number] Patient Account Number: [id number] Date of Birth/Sex: Apr 29, 1941 (78 y.o. M) Treating RN: Montey Hora Primary Care Justinn Welter: Lamonte Sakai Other Clinician: Referring Beyonce Sawatzky: Lamonte Sakai Treating Ericberto Padget/Extender: Melburn Hake, HOYT Weeks in Treatment: 2 Encounter Discharge Information Items Discharge Condition: Stable Ambulatory Status: Wheelchair Discharge Destination: Home Transportation: Private Auto Accompanied By: self Schedule Follow-up Appointment: Yes Clinical Summary of Care: Electronic Signature(s) Signed: 02/02/2020 1:15:24 PM By: Montey Hora Entered By: Montey Hora on 02/02/2020 12:32:45 Stanley Marks ([id number]) -------------------------------------------------------------------------------- Lower Extremity Assessment Details Patient Name: Stanley Marks Date of Service: 02/02/2020 12:00 PM Medical Record Number: [id number] Patient Account Number: [id number] Date of Birth/Sex: 14-Jun-1941 (78 y.o. M) Treating RN: Cornell Barman Primary Care Raeley Gilmore: Lamonte Sakai Other Clinician: Referring Negin Hegg: Lamonte Sakai Treating Valori Hollenkamp/Extender: Melburn Hake, HOYT Weeks in Treatment: 2 Edema Assessment Assessed: [Left: No] [Right: No] [Left: Edema] [Right: :] Calf Left: Right: Point of Measurement: 35 cm From Medial Instep 48 cm cm Ankle Left: Right: Point of Measurement: 13 cm From Medial Instep 25 cm cm Vascular Assessment Pulses: Dorsalis Pedis Palpable: [Left:Yes] Electronic Signature(s) Signed: 02/12/2020 5:40:44 PM By: Gretta Cool, BSN, RN, CWS, Kim RN, BSN Entered By: Gretta Cool, BSN, RN, CWS, Kim on 02/02/2020 12:13:59 Stanley Marks ([id number]) -------------------------------------------------------------------------------- Multi Wound Chart Details Patient Name: Stanley Marks Date of Service: 02/02/2020 12:00 PM Medical Record Number: [id number] Patient Account Number: [id number] Date of Birth/Sex: 05/03/1941 (78 y.o. 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 Comorbid History: Cataracts, Chronic Obstructive Cataracts, Chronic Obstructive N/A Pulmonary Disease (COPD), Pulmonary Disease (COPD), Arrhythmia, Congestive Heart Arrhythmia, Congestive Heart Failure, Hypertension, Osteomyelitis Failure, Hypertension, Osteomyelitis Date Acquired: 12/30/2019 12/30/2019 12/30/2019 Weeks of Treatment:  2 2 2  Wound Status: Open Open Healed - Epithelialized Measurements L x W x D (cm) 5x3.5x0.1 3.8x3x0.1 0x0x0 Area (cm) : 13.744 8.954 0 Volume (cm) : 1.374 0.895 0 % Reduction in Area: 69.70% 41.00% 100.00% % Reduction in Volume: 69.70% 41.00% 100.00% Classification: Full Thickness Without Exposed Full Thickness Without Exposed Full Thickness Without Exposed Support Structures Support Structures Support Structures Exudate Amount: Medium Medium N/A Exudate Type: Serosanguineous Serosanguineous N/A Exudate Color: red, brown red, brown N/A Wound Margin: Flat and Intact Flat and Intact N/A Granulation Amount: Large (67-100%) Large (67-100%) N/A Granulation Quality: Red Pink N/A Necrotic Amount: Small (1-33%) Small (1-33%) N/A Exposed Structures: Fat Layer (Subcutaneous Tissue) Fat Layer (Subcutaneous Tissue) N/A Exposed: Yes Exposed: Yes Fascia: No Fascia: No Tendon: No Tendon: No Muscle: No Muscle: No Joint: No Joint: No Bone: No Bone: No Epithelialization: Medium (34-66%) Large (67-100%) N/A Treatment Notes Electronic Signature(s) Signed: 02/02/2020 1:15:24 PM By: Josetta Huddle ([id number]) Entered By: Montey Hora on 02/02/2020 12:30:01 Stanley Marks  ([id number]) -------------------------------------------------------------------------------- Multi-Disciplinary Care Plan Details Patient Name: Stanley Marks Date of Service: 02/02/2020 12:00 PM Medical Record Number: [id number] Patient Account Number: [id number] Date of Birth/Sex: 11-22-1941 (78 y.o. M) Treating RN: Montey Hora Primary Care Malaquias Lenker: Lamonte Sakai Other Clinician: Referring Witney Huie: Lamonte Sakai Treating Nohelia Valenza/Extender: Melburn Hake, HOYT Weeks in Treatment: 2 Active Inactive Abuse / Safety / Falls / Self Care Management Nursing Diagnoses: Potential for falls Goals: Patient will remain injury free related to falls Date Initiated: 01/19/2020 Target Resolution Date: 04/16/2020 Goal Status: Active Interventions: Assess fall risk on admission and as needed Notes: Orientation to the Wound Care Program Nursing Diagnoses: Knowledge deficit related to the wound healing center program Goals: Patient/caregiver will verbalize understanding of the [facility] Date Initiated: 01/19/2020 Target Resolution Date: 04/16/2020 Goal Status: Active Interventions: Provide education on orientation to the wound center Notes: Venous Leg Ulcer Nursing Diagnoses: Potential for venous Insuffiency (use before diagnosis confirmed) Goals: Patient will maintain optimal edema control Date Initiated: 01/19/2020 Target Resolution Date: 04/16/2020 Goal Status: Active Interventions: Provide education on venous insufficiency Notes: Wound/Skin Impairment Nursing Diagnoses: Impaired tissue integrity ALVONTE, PIKER ([id number]) Goals: Ulcer/skin breakdown will heal within 14 weeks Date Initiated: 01/19/2020 Target Resolution Date: 04/16/2020 Goal Status: Active Interventions: Assess patient/caregiver ability to obtain necessary supplies Assess patient/caregiver ability to perform ulcer/skin care regimen upon admission and as needed Assess ulceration(s) every  visit Notes: Electronic Signature(s) Signed: 02/02/2020 1:15:24 PM By: Montey Hora Entered By: Montey Hora on 02/02/2020 12:29:47 Stanley Marks ([id number]) -------------------------------------------------------------------------------- Pain Assessment Details Patient Name: Stanley Marks Date of Service: 02/02/2020 12:00 PM Medical Record Number: [id number] Patient Account Number: [id number] Date of Birth/Sex: Oct 10, 1941 (79 y.o. M) Treating RN: Cornell Barman Primary Care Evie Croston: Lamonte Sakai Other Clinician: Referring Dejane Scheibe: Lamonte Sakai Treating Nikiah Goin/Extender: Melburn Hake, HOYT Weeks in Treatment: 2 Active Problems Location of Pain Severity and Description of Pain Patient Has Paino Yes Site Locations Rate the pain. Current Pain Level: 4 Pain Management and Medication Current Pain Management: Notes Hurts all the time, but deals with it. Electronic Signature(s) Signed: 02/12/2020 5:40:44 PM By: Gretta Cool, BSN, RN, CWS, Kim RN, BSN Entered By: Gretta Cool, BSN, RN, CWS, Kim on 02/02/2020 12:09:52 Stanley Marks ([id number]) -------------------------------------------------------------------------------- Patient/Caregiver Education Details Patient Name: Stanley Marks Date of Service: 02/02/2020 12:00 PM Medical Record Number: [id number] Patient Account Number: [id number] Date of Birth/Gender: 01/10/1941 (78 y.o. M) Treating RN: Montey Hora Primary Care Physician: Lamonte Sakai Other Clinician: Referring Physician: Lamonte Sakai Treating Physician/Extender: Joaquim Lai  III, HOYT Weeks in Treatment: 2 Education Assessment Education Provided To: Patient Education Topics Provided Venous: Handouts: Other: leg elevation Methods: Explain/Verbal Responses: State content correctly Electronic Signature(s) Signed: 02/02/2020 1:15:24 PM By: Montey Hora Entered By: Montey Hora on 02/02/2020 12:32:12 Stanley Marks  ([id number]) -------------------------------------------------------------------------------- Wound Assessment Details Patient Name: Stanley Marks Date of Service: 02/02/2020 12:00 PM Medical Record Number: [id number] Patient Account Number: [id number] Date of Birth/Sex: 12/09/1941 (78 y.o. M) Treating RN: Cornell Barman Primary Care Shanetha Bradham: Lamonte Sakai Other Clinician: Referring Keenen Roessner: Lamonte Sakai Treating Mckoy Bhakta/Extender: Melburn Hake, HOYT Weeks in Treatment: 2 Wound Status Wound Number: 6 Primary Venous Leg Ulcer Etiology: Wound Location: Left Lower Leg - Posterior, Proximal Wound Open Wounding Event: Blister Status: Date Acquired: 12/30/2019 Comorbid Cataracts, Chronic Obstructive Pulmonary Disease (COPD), Weeks Of Treatment: 2 History: Arrhythmia, Congestive Heart Failure, Hypertension, Clustered Wound: No Osteomyelitis Photos Wound Measurements Length: (cm) 5 Width: (cm) 3.5 Depth: (cm) 0.1 Area: (cm) 13.744 Volume: (cm) 1.374 % Reduction in Area: 69.7% % Reduction in Volume: 69.7% Epithelialization: Medium (34-66%) Tunneling: No Undermining: No Wound Description Classification: Full Thickness Without Exposed Support Structu Wound Margin: Flat and Intact Exudate Amount: Medium Exudate Type: Serosanguineous Exudate Color: red, brown res Foul Odor After Cleansing: No Slough/Fibrino Yes Wound Bed Granulation Amount: Large (67-100%) Exposed Structure Granulation Quality: Red Fascia Exposed: No Necrotic Amount: Small (1-33%) Fat Layer (Subcutaneous Tissue) Exposed: Yes Necrotic Quality: Adherent Slough Tendon Exposed: No Muscle Exposed: No Joint Exposed: No Bone Exposed: No Electronic Signature(s) Signed: 02/12/2020 5:40:44 PM By: Gretta Cool, BSN, RN, CWS, Kim RN, BSN Entered By: Gretta Cool, BSN, RN, CWS, Kim on 02/02/2020 12:12:12 Stanley Marks ([id number]) -------------------------------------------------------------------------------- Wound Assessment  Details Patient Name: Stanley Marks Date of Service: 02/02/2020 12:00 PM Medical Record Number: [id number] Patient Account Number: [id number] Date of Birth/Sex: 1941/05/05 (78 y.o. M) Treating RN: Cornell Barman Primary Care Dorie Ohms: Lamonte Sakai Other Clinician: Referring Jamieka Royle: Lamonte Sakai Treating Haygen Zebrowski/Extender: Melburn Hake, HOYT Weeks in Treatment: 2 Wound Status Wound Number: 7 Primary Venous Leg Ulcer Etiology: Wound Location: Left Lower Leg - Posterior, Distal Wound Open Wounding Event: Blister Status: Date Acquired: 12/30/2019 Comorbid Cataracts, Chronic Obstructive Pulmonary Disease (COPD), Weeks Of Treatment: 2 History: Arrhythmia, Congestive Heart Failure, Hypertension, Clustered Wound: No Osteomyelitis Photos Wound Measurements Length: (cm) 3.8 Width: (cm) 3 Depth: (cm) 0.1 Area: (cm) 8.954 Volume: (cm) 0.895 % Reduction in Area: 41% % Reduction in Volume: 41% Epithelialization: Large (67-100%) Tunneling: No Undermining: No Wound Description Classification: Full Thickness Without Exposed Support Structu Wound Margin: Flat and Intact Exudate Amount: Medium Exudate Type: Serosanguineous Exudate Color: red, brown res Foul Odor After Cleansing: No Slough/Fibrino Yes Wound Bed Granulation Amount: Large (67-100%) Exposed Structure Granulation Quality: Pink Fascia Exposed: No Necrotic Amount: Small (1-33%) Fat Layer (Subcutaneous Tissue) Exposed: Yes Necrotic Quality: Adherent Slough Tendon Exposed: No Muscle Exposed: No Joint Exposed: No Bone Exposed: No Electronic Signature(s) Signed: 02/12/2020 5:40:44 PM By: Gretta Cool, BSN, RN, CWS, Kim RN, BSN Entered By: Gretta Cool, BSN, RN, CWS, Kim on 02/02/2020 12:12:53 Stanley Marks ([id number]) -------------------------------------------------------------------------------- Wound Assessment Details Patient Name: Stanley Marks Date of Service: 02/02/2020 12:00 PM Medical Record Number: [id number] Patient Account  Number: [id number] Date of Birth/Sex: 10-May-1941 (78 y.o. M) Treating RN: Cornell Barman Primary Care Omri Bertran: Lamonte Sakai Other Clinician: Referring Alexei Ey: Lamonte Sakai Treating Kineta Fudala/Extender: Melburn Hake, HOYT Weeks in Treatment: 2 Wound Status Wound Number: 8 Primary Etiology: Venous Leg Ulcer Wound Location: Left, Anterior Lower Leg Wound Status: Healed - Epithelialized Wounding Event: Blister Date Acquired: 12/30/2019 Weeks Of  Treatment: 2 Clustered Wound: No Photos Photo Uploaded By: Gretta Cool, BSN, RN, CWS, Kim on 02/02/2020 13:38:05 Wound Measurements Length: (cm) Width: (cm) Depth: (cm) Area: (cm) Volume: (cm) 0 % Reduction in Area: 100% 0 % Reduction in Volume: 100% 0 0 0 Wound Description Classification: Full Thickness Without Exposed Support Structu res Engineer, maintenance) Signed: 02/12/2020 5:40:44 PM By: Gretta Cool, BSN, RN, CWS, Kim RN, BSN Entered By: Gretta Cool, BSN, RN, CWS, Kim on 02/02/2020 12:11:16 Stanley Marks ([id number]) -------------------------------------------------------------------------------- [place] Details Patient Name: Stanley Marks Date of Service: 02/02/2020 12:00 PM Medical Record Number: [id number] Patient Account Number: [id number] Date of Birth/Sex: [birth year] (78 y.o. M) Treating RN: Montey Hora Primary Care Kamryn Gauthier: Lamonte Sakai Other Clinician: Referring Joandy Burget: Lamonte Sakai Treating Adeleine Pask/Extender: Melburn Hake, HOYT Weeks in Treatment: 2 Vital Signs Time Taken: 12:00 Temperature (F): 98.3 Height (in): 74 Pulse (bpm): 77 Weight (lbs): 290 Respiratory Rate (breaths/min): 18 Body Mass Index (BMI): 37.2 Blood Pressure (mmHg): 115/68 Reference Range: 80 - 120 mg / dl Electronic Signature(s) Signed: 02/02/2020 12:54:44 PM By: Lorine Bears RCP, RRT, CHT Entered By: Lorine Bears on 02/02/2020 12:05:09

## 2020-02-08 ENCOUNTER — Other Ambulatory Visit: Payer: Self-pay | Admitting: Family

## 2020-02-08 DIAGNOSIS — M79601 Pain in right arm: Secondary | ICD-10-CM

## 2020-02-08 DIAGNOSIS — I8290 Acute embolism and thrombosis of unspecified vein: Secondary | ICD-10-CM

## 2020-02-09 ENCOUNTER — Encounter: Payer: Medicare Other | Attending: Physician Assistant | Admitting: Physician Assistant

## 2020-02-09 ENCOUNTER — Other Ambulatory Visit: Payer: Self-pay

## 2020-02-09 DIAGNOSIS — M1711 Unilateral primary osteoarthritis, right knee: Secondary | ICD-10-CM | POA: Insufficient documentation

## 2020-02-09 DIAGNOSIS — I110 Hypertensive heart disease with heart failure: Secondary | ICD-10-CM | POA: Diagnosis not present

## 2020-02-09 DIAGNOSIS — E669 Obesity, unspecified: Secondary | ICD-10-CM | POA: Insufficient documentation

## 2020-02-09 DIAGNOSIS — I5042 Chronic combined systolic (congestive) and diastolic (congestive) heart failure: Secondary | ICD-10-CM | POA: Insufficient documentation

## 2020-02-09 DIAGNOSIS — J449 Chronic obstructive pulmonary disease, unspecified: Secondary | ICD-10-CM | POA: Diagnosis not present

## 2020-02-09 DIAGNOSIS — L97822 Non-pressure chronic ulcer of other part of left lower leg with fat layer exposed: Secondary | ICD-10-CM | POA: Diagnosis not present

## 2020-02-09 DIAGNOSIS — Z6837 Body mass index (BMI) 37.0-37.9, adult: Secondary | ICD-10-CM | POA: Insufficient documentation

## 2020-02-09 DIAGNOSIS — I872 Venous insufficiency (chronic) (peripheral): Secondary | ICD-10-CM | POA: Diagnosis present

## 2020-02-09 DIAGNOSIS — I890 Lymphedema, not elsewhere classified: Secondary | ICD-10-CM | POA: Diagnosis not present

## 2020-02-09 NOTE — Progress Notes (Signed)
Stanley, Marks ([id number]) Visit Report for 02/09/2020 Arrival Information Details Patient Name: Stanley Marks, Stanley Marks Date of Service: 02/09/2020 8:45 AM Medical Record Number: [id number] Patient Account Number: [id number] Date of Birth/Sex: [birth year] (79 y.o. M) Treating RN: Montey Hora Primary Care Kessie Croston: Lamonte Sakai Other Clinician: Referring Yaman Grauberger: Lamonte Sakai Treating Jovanne Riggenbach/Extender: Melburn Hake, HOYT Weeks in Treatment: 3 Visit Information History Since Last Visit Added or deleted any medications: No Patient Arrived: Walker Any new allergies or adverse reactions: No Arrival Time: 08:45 Had a fall or experienced change in No Accompanied By: caregiver activities of daily living that may affect Transfer Assistance: None risk of falls: Patient Identification Verified: Yes Signs or symptoms of abuse/neglect since last visito No Secondary Verification Process Completed: Yes Hospitalized since last visit: No Implantable device outside of the clinic excluding No cellular tissue based products placed in the center since last visit: Has Dressing in Place as Prescribed: Yes Pain Present Now: No Electronic Signature(s) Signed: 02/09/2020 4:01:06 PM By: Lorine Bears RCP, RRT, CHT Entered By: Lorine Bears on 02/09/2020 08:47:01 Stanley Marks ([id number]) -------------------------------------------------------------------------------- Clinic Level of Care Assessment Details Patient Name: Stanley Marks Date of Service: 02/09/2020 8:45 AM Medical Record Number: [id number] Patient Account Number: [id number] Date of Birth/Sex: [birth year] (78 y.o. M) Treating RN: Montey Hora Primary Care Raphael Fitzpatrick: Lamonte Sakai Other Clinician: Referring Piercen Covino: Lamonte Sakai Treating Kaidin Boehle/Extender: Melburn Hake, HOYT Weeks in Treatment: 3 Clinic Level of Care Assessment Items TOOL 4 Quantity Score []  - Use when only an EandM is performed on FOLLOW-UP visit  0 ASSESSMENTS - Nursing Assessment / Reassessment X - Reassessment of Co-morbidities (includes updates in patient status) 1 10 X- 1 5 Reassessment of Adherence to Treatment Plan ASSESSMENTS - Wound and Skin Assessment / Reassessment []  - Simple Wound Assessment / Reassessment - one wound 0 X- 2 5 Complex Wound Assessment / Reassessment - multiple wounds []  - 0 Dermatologic / Skin Assessment (not related to wound area) ASSESSMENTS - Focused Assessment X - Circumferential Edema Measurements - multi extremities 1 5 []  - 0 Nutritional Assessment / Counseling / Intervention X- 1 5 Lower Extremity Assessment (monofilament, tuning fork, pulses) []  - 0 Peripheral Arterial Disease Assessment (using hand held doppler) ASSESSMENTS - Ostomy and/or Continence Assessment and Care []  - Incontinence Assessment and Management 0 []  - 0 Ostomy Care Assessment and Management (repouching, etc.) PROCESS - Coordination of Care X - Simple Patient / Family Education for ongoing care 1 15 []  - 0 Complex (extensive) Patient / Family Education for ongoing care X- 1 10 Staff obtains Programmer, systems, Records, Test Results / Process Orders []  - 0 Staff telephones HHA, Nursing Homes / Clarify orders / etc []  - 0 Routine Transfer to another Facility (non-emergent condition) []  - 0 Routine Hospital Admission (non-emergent condition) []  - 0 New Admissions / Biomedical engineer / Ordering NPWT, Apligraf, etc. []  - 0 Emergency Hospital Admission (emergent condition) X- 1 10 Simple Discharge Coordination []  - 0 Complex (extensive) Discharge Coordination PROCESS - Special Needs []  - Pediatric / Minor Patient Management 0 []  - 0 Isolation Patient Management []  - 0 Hearing / Language / Visual special needs []  - 0 Assessment of Community assistance (transportation, D/C planning, etc.) ELDRIC, JOACHIM ([id number]) []  - 0 Additional assistance / Altered mentation []  - 0 Support Surface(s) Assessment (bed,  cushion, seat, etc.) INTERVENTIONS - Wound Cleansing / Measurement []  - Simple Wound Cleansing - one wound 0 X- 2 5 Complex Wound Cleansing - multiple wounds X- 1 5 Wound Imaging (photographs -  any number of wounds) []  - 0 Wound Tracing (instead of photographs) []  - 0 Simple Wound Measurement - one wound X- 2 5 Complex Wound Measurement - multiple wounds INTERVENTIONS - Wound Dressings []  - Small Wound Dressing one or multiple wounds 0 []  - 0 Medium Wound Dressing one or multiple wounds X- 1 20 Large Wound Dressing one or multiple wounds []  - 0 Application of Medications - topical []  - 0 Application of Medications - injection INTERVENTIONS - Miscellaneous []  - External ear exam 0 []  - 0 Specimen Collection (cultures, biopsies, blood, body fluids, etc.) []  - 0 Specimen(s) / Culture(s) sent or taken to Lab for analysis []  - 0 Patient Transfer (multiple staff / Civil Service fast streamer / Similar devices) []  - 0 Simple Staple / Suture removal (25 or less) []  - 0 Complex Staple / Suture removal (26 or more) []  - 0 Hypo / Hyperglycemic Management (close monitor of Blood Glucose) []  - 0 Ankle / Brachial Index (ABI) - do not check if billed separately X- 1 5 Vital Signs Has the patient been seen at the hospital within the last three years: Yes Total Score: 120 Level Of Care: New/Established - Level 4 Electronic Signature(s) Signed: 02/09/2020 4:45:34 PM By: Montey Hora Entered By: Montey Hora on 02/09/2020 09:24:09 Stanley Marks ([id number]) -------------------------------------------------------------------------------- Encounter Discharge Information Details Patient Name: Stanley Marks Date of Service: 02/09/2020 8:45 AM Medical Record Number: [id number] Patient Account Number: [id number] Date of Birth/Sex: 14-Oct-1941 (79 y.o. M) Treating RN: Montey Hora Primary Care Sanjana Folz: Lamonte Sakai Other Clinician: Referring Zarinah Oviatt: Lamonte Sakai Treating Rozalyn Osland/Extender: Melburn Hake, HOYT Weeks in Treatment: 3 Encounter Discharge Information Items Discharge Condition: Stable Ambulatory Status: Walker Discharge Destination: Home Transportation: Private Auto Accompanied By: caregiver Schedule Follow-up Appointment: Yes Clinical Summary of Care: Electronic Signature(s) Signed: 02/09/2020 9:25:12 AM By: Montey Hora Entered By: Montey Hora on 02/09/2020 09:25:11 Stanley Marks ([id number]) -------------------------------------------------------------------------------- Lower Extremity Assessment Details Patient Name: Stanley Marks Date of Service: 02/09/2020 8:45 AM Medical Record Number: [id number] Patient Account Number: [id number] Date of Birth/Sex: 1941-06-05 (78 y.o. M) Treating RN: Army Melia Primary Care Mihira Tozzi: Lamonte Sakai Other Clinician: Referring Gladys Deckard: Lamonte Sakai Treating Sukanya Goldblatt/Extender: Melburn Hake, HOYT Weeks in Treatment: 3 Edema Assessment Assessed: [Left: No] [Right: No] Edema: [Left: Ye] [Right: s] Calf Left: Right: Point of Measurement: 35 cm From Medial Instep 43 cm cm Ankle Left: Right: Point of Measurement: 13 cm From Medial Instep 25 cm cm Vascular Assessment Pulses: Dorsalis Pedis Palpable: [Left:Yes] Electronic Signature(s) Signed: 02/09/2020 4:27:58 PM By: Army Melia Entered By: Army Melia on 02/09/2020 08:53:18 Stanley Marks ([id number]) -------------------------------------------------------------------------------- Multi Wound Chart Details Patient Name: Stanley Marks Date of Service: 02/09/2020 8:45 AM Medical Record Number: [id number] Patient Account Number: [id number] Date of Birth/Sex: February 28, 1941 (79 y.o. M) Treating RN: Montey Hora Primary Care Lakechia Nay: Lamonte Sakai Other Clinician: Referring Niklas Chretien: Lamonte Sakai Treating Lakeesha Fontanilla/Extender: Melburn Hake, HOYT Weeks in Treatment: 3 Vital Signs Height(in): 76 Pulse(bpm): 41 Weight(lbs): 290 Blood Pressure(mmHg): 117/62 Body Mass Index(BMI):  37 Temperature(F): 97.9 Respiratory Rate(breaths/min): 18 Photos: [N/A:N/A] Wound Location: Left Lower Leg - Posterior, Proximal Left Lower Leg - Posterior, Distal N/A Wounding Event: Blister Blister N/A Primary Etiology: Venous Leg Ulcer Venous Leg Ulcer N/A Comorbid History: Cataracts, Chronic Obstructive Cataracts, Chronic Obstructive N/A Pulmonary Disease (COPD), Pulmonary Disease (COPD), Arrhythmia, Congestive Heart Arrhythmia, Congestive Heart Failure, Hypertension, Osteomyelitis Failure, Hypertension, Osteomyelitis Date Acquired: 12/30/2019 12/30/2019 N/A Weeks of Treatment: 3 3 N/A Wound Status: Open Open N/A Measurements L x W x D (cm)  2.5x3.5x0.1 1x1x0.1 N/A Area (cm) : 6.872 0.785 N/A Volume (cm) : 0.687 0.079 N/A % Reduction in Area: 84.80% 94.80% N/A % Reduction in Volume: 84.90% 94.80% N/A Classification: Full Thickness Without Exposed Full Thickness Without Exposed N/A Support Structures Support Structures Exudate Amount: Medium Medium N/A Exudate Type: Serosanguineous Serosanguineous N/A Exudate Color: red, brown red, brown N/A Wound Margin: Flat and Intact Flat and Intact N/A Granulation Amount: Large (67-100%) Large (67-100%) N/A Granulation Quality: Red Pink N/A Necrotic Amount: Small (1-33%) Small (1-33%) N/A Exposed Structures: Fat Layer (Subcutaneous Tissue) Fat Layer (Subcutaneous Tissue) N/A Exposed: Yes Exposed: Yes Fascia: No Fascia: No Tendon: No Tendon: No Muscle: No Muscle: No Joint: No Joint: No Bone: No Bone: No Epithelialization: Medium (34-66%) Large (67-100%) N/A Treatment Notes Electronic Signature(s) Signed: 02/09/2020 4:45:34 PM By: Josetta Huddle ([id number]) Entered By: Montey Hora on 02/09/2020 09:16:45 Stanley Marks ([id number]) -------------------------------------------------------------------------------- Multi-Disciplinary Care Plan Details Patient Name: Stanley Marks Date of Service: 02/09/2020 8:45  AM Medical Record Number: [id number] Patient Account Number: [id number] Date of Birth/Sex: May 18, 1941 (78 y.o. M) Treating RN: Montey Hora Primary Care Deven Furia: Lamonte Sakai Other Clinician: Referring Seabron Iannello: Lamonte Sakai Treating Haidar Muse/Extender: Melburn Hake, HOYT Weeks in Treatment: 3 Active Inactive Abuse / Safety / Falls / Self Care Management Nursing Diagnoses: Potential for falls Goals: Patient will remain injury free related to falls Date Initiated: 01/19/2020 Target Resolution Date: 04/16/2020 Goal Status: Active Interventions: Assess fall risk on admission and as needed Notes: Orientation to the Wound Care Program Nursing Diagnoses: Knowledge deficit related to the wound healing center program Goals: Patient/caregiver will verbalize understanding of the [facility] Program Date Initiated: 01/19/2020 Target Resolution Date: 04/16/2020 Goal Status: Active Interventions: Provide education on orientation to the wound center Notes: Venous Leg Ulcer Nursing Diagnoses: Potential for venous Insuffiency (use before diagnosis confirmed) Goals: Patient will maintain optimal edema control Date Initiated: 01/19/2020 Target Resolution Date: 04/16/2020 Goal Status: Active Interventions: Provide education on venous insufficiency Notes: Wound/Skin Impairment Nursing Diagnoses: Impaired tissue integrity MARSHUN, FULMORE ([id number]) Goals: Ulcer/skin breakdown will heal within 14 weeks Date Initiated: 01/19/2020 Target Resolution Date: 04/16/2020 Goal Status: Active Interventions: Assess patient/caregiver ability to obtain necessary supplies Assess patient/caregiver ability to perform ulcer/skin care regimen upon admission and as needed Assess ulceration(s) every visit Notes: Electronic Signature(s) Signed: 02/09/2020 4:45:34 PM By: Montey Hora Entered By: Montey Hora on 02/09/2020 09:16:35 Stanley Marks  ([id number]) -------------------------------------------------------------------------------- Pain Assessment Details Patient Name: Stanley Marks Date of Service: 02/09/2020 8:45 AM Medical Record Number: [id number] Patient Account Number: [id number] Date of Birth/Sex: 1941/07/04 (78 y.o. M) Treating RN: Army Melia Primary Care Belvia Gotschall: Lamonte Sakai Other Clinician: Referring Madisyn Mawhinney: Lamonte Sakai Treating Sharone Almond/Extender: Melburn Hake, HOYT Weeks in Treatment: 3 Active Problems Location of Pain Severity and Description of Pain Patient Has Paino No Site Locations Pain Management and Medication Current Pain Management: Electronic Signature(s) Signed: 02/09/2020 4:27:58 PM By: Army Melia Entered By: Army Melia on 02/09/2020 08:57:11 Stanley Marks ([id number]) -------------------------------------------------------------------------------- Patient/Caregiver Education Details Patient Name: Stanley Marks Date of Service: 02/09/2020 8:45 AM Medical Record Number: [id number] Patient Account Number: [id number] Date of Birth/Gender: 1941/09/26 (78 y.o. M) Treating RN: Montey Hora Primary Care Physician: Lamonte Sakai Other Clinician: Referring Physician: Lamonte Sakai Treating Physician/Extender: Sharalyn Ink in Treatment: 3 Education Assessment Education Provided To: Patient Education Topics Provided Venous: Handouts: Other: leg elevation Methods: Explain/Verbal Responses: State content correctly Electronic Signature(s) Signed: 02/09/2020 4:45:34 PM By: Montey Hora Entered By: Montey Hora on 02/09/2020 09:24:36 Stanley Marks ([id number]) --------------------------------------------------------------------------------  Wound Assessment Details Patient Name: ELIAM, THILGES Date of Service: 02/09/2020 8:45 AM Medical Record Number: [id number] Patient Account Number: [id number] Date of Birth/Sex: 1941-10-17 (79 y.o. M) Treating RN: Army Melia Primary Care Yassin Scales:  Lamonte Sakai Other Clinician: Referring Jeryl Wilbourn: Lamonte Sakai Treating Seini Lannom/Extender: Melburn Hake, HOYT Weeks in Treatment: 3 Wound Status Wound Number: 6 Primary Venous Leg Ulcer Etiology: Wound Location: Left Lower Leg - Posterior, Proximal Wound Open Wounding Event: Blister Status: Date Acquired: 12/30/2019 Comorbid Cataracts, Chronic Obstructive Pulmonary Disease (COPD), Weeks Of Treatment: 3 History: Arrhythmia, Congestive Heart Failure, Hypertension, Clustered Wound: No Osteomyelitis Photos Wound Measurements Length: (cm) 2.5 Width: (cm) 3.5 Depth: (cm) 0.1 Area: (cm) 6.872 Volume: (cm) 0.687 % Reduction in Area: 84.8% % Reduction in Volume: 84.9% Epithelialization: Medium (34-66%) Tunneling: No Undermining: No Wound Description Classification: Full Thickness Without Exposed Support Struct Wound Margin: Flat and Intact Exudate Amount: Medium Exudate Type: Serosanguineous Exudate Color: red, brown ures Foul Odor After Cleansing: No Slough/Fibrino Yes Wound Bed Granulation Amount: Large (67-100%) Exposed Structure Granulation Quality: Red Fascia Exposed: No Necrotic Amount: Small (1-33%) Fat Layer (Subcutaneous Tissue) Exposed: Yes Necrotic Quality: Adherent Slough Tendon Exposed: No Muscle Exposed: No Joint Exposed: No Bone Exposed: No Treatment Notes Wound #6 (Left, Proximal, Posterior Lower Leg) Notes silvercel, abd, kerlix/coban wrap with unna to anchor Electronic Signature(s) KALYX, SPADE ([id number]) Signed: 02/09/2020 4:27:58 PM By: Army Melia Entered By: Army Melia on 02/09/2020 08:56:36 Stanley Marks ([id number]) -------------------------------------------------------------------------------- Wound Assessment Details Patient Name: Stanley Marks Date of Service: 02/09/2020 8:45 AM Medical Record Number: [id number] Patient Account Number: [id number] Date of Birth/Sex: 1941-03-12 (78 y.o. M) Treating RN: Army Melia Primary Care Ashish Rossetti:  Lamonte Sakai Other Clinician: Referring Cameo Shewell: Lamonte Sakai Treating Alizaya Oshea/Extender: Melburn Hake, HOYT Weeks in Treatment: 3 Wound Status Wound Number: 7 Primary Venous Leg Ulcer Etiology: Wound Location: Left Lower Leg - Posterior, Distal Wound Open Wounding Event: Blister Status: Date Acquired: 12/30/2019 Comorbid Cataracts, Chronic Obstructive Pulmonary Disease (COPD), Weeks Of Treatment: 3 History: Arrhythmia, Congestive Heart Failure, Hypertension, Clustered Wound: No Osteomyelitis Photos Wound Measurements Length: (cm) 1 Width: (cm) 1 Depth: (cm) 0.1 Area: (cm) 0.785 Volume: (cm) 0.079 % Reduction in Area: 94.8% % Reduction in Volume: 94.8% Epithelialization: Large (67-100%) Tunneling: No Undermining: No Wound Description Classification: Full Thickness Without Exposed Support Struct Wound Margin: Flat and Intact Exudate Amount: Medium Exudate Type: Serosanguineous Exudate Color: red, brown ures Foul Odor After Cleansing: No Slough/Fibrino Yes Wound Bed Granulation Amount: Large (67-100%) Exposed Structure Granulation Quality: Pink Fascia Exposed: No Necrotic Amount: Small (1-33%) Fat Layer (Subcutaneous Tissue) Exposed: Yes Necrotic Quality: Adherent Slough Tendon Exposed: No Muscle Exposed: No Joint Exposed: No Bone Exposed: No Treatment Notes Wound #7 (Left, Distal, Posterior Lower Leg) Notes silvercel, abd, kerlix/coban wrap with unna to anchor Electronic Signature(s) DANIYEL, GOODIE ([id number]) Signed: 02/09/2020 4:27:58 PM By: Army Melia Entered By: Army Melia on 02/09/2020 08:56:55 Stanley Marks ([id number]) -------------------------------------------------------------------------------- Vitals Details Patient Name: Stanley Marks Date of Service: 02/09/2020 8:45 AM Medical Record Number: [id number] Patient Account Number: [id number] Date of Birth/Sex: 1940/12/21 (78 y.o. M) Treating RN: Montey Hora Primary Care Ben Habermann: Lamonte Sakai  Other Clinician: Referring Khrystian Schauf: Lamonte Sakai Treating Dawnmarie Breon/Extender: Melburn Hake, HOYT Weeks in Treatment: 3 Vital Signs Time Taken: 08:45 Temperature (F): 97.9 Height (in): 74 Pulse (bpm): 71 Weight (lbs): 290 Respiratory Rate (breaths/min): 18 Body Mass Index (BMI): 37.2 Blood Pressure (mmHg): 117/62 Reference Range: 80 - 120 mg / dl Electronic Signature(s) Signed: 02/09/2020 4:01:06 PM By: Lorine Bears RCP, RRT,  CHT Entered By: Lorine Bears on 02/09/2020 08:48:02

## 2020-02-09 NOTE — Progress Notes (Addendum)
Stanley Marks ([id number]) Visit Report for 02/09/2020 Chief Complaint Document Details Patient Name: Stanley Marks, Stanley Marks Date of Service: 02/09/2020 8:45 AM Medical Record Number: [id number] Patient Account Number: [id number] Date of Birth/Sex: December 25, 1940 (79 y.o. M) Treating RN: Montey Hora Primary Care Provider: Lamonte Sakai Other Clinician: Referring Provider: Lamonte Sakai Treating Provider/Extender: Melburn Hake, Jadah Bobak Weeks in Treatment: 3 Information Obtained from: Patient Chief Complaint Left LE Ulcers Electronic Signature(s) Signed: 02/09/2020 8:48:44 AM By: Worthy Keeler PA-C Entered By: Worthy Keeler on 02/09/2020 08:48:44 Stanley Marks ([id number]) -------------------------------------------------------------------------------- HPI Details Patient Name: Stanley Marks Date of Service: 02/09/2020 8:45 AM Medical Record Number: [id number] Patient Account Number: [id number] Date of Birth/Sex: [birth year] (79 y.o. M) Treating RN: Montey Hora Primary Care Provider: Lamonte Sakai Other Clinician: Referring Provider: Lamonte Sakai Treating Provider/Extender: Melburn Hake, Vinnie Bobst Weeks in Treatment: 3 History of Present Illness HPI Description: Pleasant 79 year old with history of chronic venous insufficiency and hypertension. No History of diabetes or peripheral arterial disease. He says that he stopped taking his "fluid pill" so that he wouldn't have to urinate as often. He subsequently developed bilateral lower extremity swelling and left calf ulcerations in late August 2016. Status post bilateral lower extremity endovenous ablation over 5 years ago in [place]. He does not regularly wear compression stockings. Denies any history of DVT. He is ambulating per his baseline. No claudication or rest pain. Tolerated 3 layer compression bandage with Prisma with significant improvement. Recently declined compression bandages and has been using a Tubigrip for edema control. He returns to clinic  for follow-up and is without complaints. No significant pain. No fever or chills. No drainage. READMISSION 10/14/2019 This is a 79 year old man who is in this clinic for years ago with a venous insufficiency wound on the left leg. He was discharged with compression stockings but he does not wear them. He states he developed a blister on the right anterior tibial area in October. He was in the ER on 10/26 after falling and noted to have a wound on his right leg/shin although the patient states the blister was already present. He was felt possibly to be mildly dehydrated he declined IV fluid. X-ray did not show any osseous abnormalities however he was noted to have severe right knee osteoarthritis. The patient has known chronic venous insufficiency. He states he had ablations in his bilateral lower legs many years ago. This seems well verified from the history noted above. He is not a diabetic and does not have known PAD. Past medical history; no diabetes, atrial fibrillation on Xarelto, history of basal cell cancer, COPD continued smoker, hypertension, peripheral vascular disease. ABIs in our clinic were 1.36 on the right and 1.24 on the left 11/11; patient with a venous insufficiency wound on the right anterior lower leg. Admitted to clinic last week. He complains bitterly about the 3 layer compression we put on him stating it was uncomfortable he could not sleep. He has previously not tolerated stockings very well as well. His ABIs in this clinic were noncompressible however his dorsalis pedis and posterior tibial pulses are easily palpable. He has decent edema control. We used Hydrofera Blue which apparently stuck to the wound. There are good improvements in wound dimensions 11/18; the patient's venous insufficiency wound on the right anterior lower leg is almost all epithelialized. However he arrives in the clinic with 2 skin tears on his forearm from a fall at noon yesterday 11/04/2019 on  evaluation today patient appears to be doing well with regard to his leg  ulcer as well as his right forearm ulcers. He has been tolerating the dressing changes without complication. Fortunately there is no sign of active infection at this time. No fevers, chills, nausea, vomiting, or diarrhea. I am extremely pleased with how things seem to be progressing based on what I am seeing today. 12/2; the patient's wound on the right anterior lower leg is healed. He has severe chronic venous insufficiency and has had previous ablations on this side. Previous ablations were done in [place]. He has not consistently worn compression stockings. He has a Tubigrip stocking on his left leg. The areas that he injured on his right dorsal forearm have some surface scabbing however I think this is on its way to closure I do not think independently they need to have him continue in this clinic. Readmission: 01/19/2020 upon evaluation today patient presents for reevaluation after having been seen last here in the clinic on November 11, 2019. He was readmitted last year on October 14, 2019 here in the clinic concerning right lower extremity ulcers. He had ABIs performed at that time that appeared to be doing well. Overall though he was only able to tolerate a Curlex and Coban wrap anything stronger was too much for him. He is still smoking unfortunately and this is an ongoing issue. He really has no plans to quit. He also does not wear compression socks he tells me that he cannot afford them. With that being said obviously I am not sure that this is really the reason behind why he does not like the compression socks my gut tells me he may have difficulty getting them on as well. Fortunately there is no signs of active infection at this time he does have a small ulcer on the anterior which actually began surface of his leg Christmas Eve 2020 he has larger wounds on the posterior surface of his leg which are larger and actually  began 2 weeks ago. He does have a history of congestive heart failure as noted by documentation. He also has venous insufficiency which is chronic as well as lymphedema. He also has hypertension. 02/03/2020 patient presents today for follow-up though I have not seen him last since 01/19/2020. He ended up in the hospital secondary to having issues with Covid. He was apparently very sick and not moving around a lot during that time. With that being said upon inspection today his wound actually appears to be doing significantly better compared to the previous evaluation even which is excellent news. The previous evaluation was the initial here in the clinic and at that time the wounds appeared to be doing much more severely in my opinion. Right now he has a lot of new skin growth which is Stanley Marks, Stanley Marks ([id number]) excellent news. With that being said the wounds are also measuring significantly smaller and my hope is that although the hospital stay obviously was not ideal that it is good to help these areas to heal much more effectively and quickly. We were using Curlex and Coban wraps for him Which is about all he can really tolerate. 02/09/2020 upon evaluation today patient actually appears to be doing much better with regard to his ulcers on his left lower extremity. In general I feel like he is making excellent progress and the wounds in general seem to also be doing well from the standpoint of size as well as no evidence of infection. Electronic Signature(s) Signed: 02/09/2020 9:23:39 AM By: Worthy Keeler PA-C Entered By: Worthy Keeler on  02/09/2020 09:23:38 Stanley Marks, Stanley Marks ([id number]) -------------------------------------------------------------------------------- Physical Exam Details Patient Name: Stanley Marks, Stanley Marks Date of Service: 02/09/2020 8:45 AM Medical Record Number: [id number] Patient Account Number: [id number] Date of Birth/Sex: [birth year] (78 y.o. M) Treating RN: Montey Hora Primary  Care Provider: Lamonte Sakai Other Clinician: Referring Provider: Lamonte Sakai Treating Provider/Extender: Melburn Hake, Santoria Chason Weeks in Treatment: 3 Constitutional Well-nourished and well-hydrated in no acute distress. Respiratory normal breathing without difficulty. Psychiatric this patient is able to make decisions and demonstrates good insight into disease process. Alert and Oriented x 3. pleasant and cooperative. Notes Patient's wound bed currently showed signs of good granulation and epithelization at this point. There does not appear to be any evidence of active infection which is good news. No fevers, chills, nausea, vomiting, or diarrhea. Electronic Signature(s) Signed: 02/09/2020 9:23:55 AM By: Worthy Keeler PA-C Entered By: Worthy Keeler on 02/09/2020 09:23:54 Stanley Marks ([id number]) -------------------------------------------------------------------------------- Physician Orders Details Patient Name: Stanley Marks Date of Service: 02/09/2020 8:45 AM Medical Record Number: [id number] Patient Account Number: [id number] Date of Birth/Sex: 12/23/1940 (78 y.o. M) Treating RN: Montey Hora Primary Care Provider: Lamonte Sakai Other Clinician: Referring Provider: Lamonte Sakai Treating Provider/Extender: Melburn Hake, Alila Sotero Weeks in Treatment: 3 Verbal / Phone Orders: No Diagnosis Coding ICD-10 Coding Code Description I87.2 Venous insufficiency (chronic) (peripheral) I89.0 Lymphedema, not elsewhere classified L97.822 Non-pressure chronic ulcer of other part of left lower leg with fat layer exposed I10 Essential (primary) hypertension I50.42 Chronic combined systolic (congestive) and diastolic (congestive) heart failure Wound Cleansing Wound #6 Left,Proximal,Posterior Lower Leg o Clean wound with Normal Saline. o Cleanse wound with mild soap and water Wound #7 Left,Distal,Posterior Lower Leg o Clean wound with Normal Saline. o Cleanse wound with mild soap and  water Skin Barriers/Peri-Wound Care Wound #6 Left,Proximal,Posterior Lower Leg o Moisturizing lotion Wound #7 Left,Distal,Posterior Lower Leg o Moisturizing lotion Primary Wound Dressing Wound #6 Left,Proximal,Posterior Lower Leg o Silver Alginate Wound #7 Left,Distal,Posterior Lower Leg o Silver Alginate Secondary Dressing Wound #6 Left,Proximal,Posterior Lower Leg o ABD pad Wound #7 Left,Distal,Posterior Lower Leg o ABD pad Dressing Change Frequency o Change dressing every week Follow-up Appointments o Return Appointment in 1 week. Edema Control Wound #6 Left,Proximal,Posterior Lower Leg o Kerlix and Coban - Left Lower Extremity - anchor with unna Wound #7 Left,Distal,Posterior Lower Leg o Kerlix and Coban - Left Lower Extremity - anchor with RAPHEAL, KACZYNSKI ([id number]) Electronic Signature(s) Signed: 02/09/2020 4:45:34 PM By: Montey Hora Signed: 02/11/2020 5:37:52 PM By: Worthy Keeler PA-C Entered By: Montey Hora on 02/09/2020 09:21:53 Stanley Marks ([id number]) -------------------------------------------------------------------------------- Problem List Details Patient Name: Stanley Marks Date of Service: 02/09/2020 8:45 AM Medical Record Number: [id number] Patient Account Number: [id number] Date of Birth/Sex: December 24, 1940 (78 y.o. M) Treating RN: Montey Hora Primary Care Provider: Lamonte Sakai Other Clinician: Referring Provider: Lamonte Sakai Treating Provider/Extender: Melburn Hake, Hannan Tetzlaff Weeks in Treatment: 3 Active Problems ICD-10 Evaluated Encounter Code Description Active Date Today Diagnosis I87.2 Venous insufficiency (chronic) (peripheral) 01/19/2020 No Yes I89.0 Lymphedema, not elsewhere classified 01/19/2020 No Yes L97.822 Non-pressure chronic ulcer of other part of left lower leg with fat layer 01/19/2020 No Yes exposed [place] (primary) hypertension 01/19/2020 No Yes I50.42 Chronic combined systolic (congestive) and diastolic  (congestive) heart 01/19/2020 No Yes failure Inactive Problems Resolved Problems Electronic Signature(s) Signed: 02/09/2020 8:48:35 AM By: Worthy Keeler PA-C Entered By: Worthy Keeler on 02/09/2020 08:48:34 Stanley Marks ([id number]) -------------------------------------------------------------------------------- Progress Note Details Patient Name: Stanley Marks Date of Service: 02/09/2020 8:45 AM Medical Record Number: [id number]  Patient Account Number: [id number] Date of Birth/Sex: [birth year] (79 y.o. M) Treating RN: Montey Hora Primary Care Provider: Lamonte Sakai Other Clinician: Referring Provider: Lamonte Sakai Treating Provider/Extender: Melburn Hake, Talonda Artist Weeks in Treatment: 3 Subjective Chief Complaint Information obtained from Patient Left LE Ulcers History of Present Illness (HPI) Pleasant 79 year old with history of chronic venous insufficiency and hypertension. No History of diabetes or peripheral arterial disease. He says that he stopped taking his "fluid pill" so that he wouldn't have to urinate as often. He subsequently developed bilateral lower extremity swelling and left calf ulcerations in late August 2016. Status post bilateral lower extremity endovenous ablation over 5 years ago in [place]. He does not regularly wear compression stockings. Denies any history of DVT. He is ambulating per his baseline. No claudication or rest pain. Tolerated 3 layer compression bandage with Prisma with significant improvement. Recently declined compression bandages and has been using a Tubigrip for edema control. He returns to clinic for follow-up and is without complaints. No significant pain. No fever or chills. No drainage. READMISSION 10/14/2019 This is a 79 year old man who is in this clinic for years ago with a venous insufficiency wound on the left leg. He was discharged with compression stockings but he does not wear them. He states he developed a blister on the right  anterior tibial area in October. He was in the ER on 10/26 after falling and noted to have a wound on his right leg/shin although the patient states the blister was already present. He was felt possibly to be mildly dehydrated he declined IV fluid. X-ray did not show any osseous abnormalities however he was noted to have severe right knee osteoarthritis. The patient has known chronic venous insufficiency. He states he had ablations in his bilateral lower legs many years ago. This seems well verified from the history noted above. He is not a diabetic and does not have known PAD. Past medical history; no diabetes, atrial fibrillation on Xarelto, history of basal cell cancer, COPD continued smoker, hypertension, peripheral vascular disease. ABIs in our clinic were 1.36 on the right and 1.24 on the left 11/11; patient with a venous insufficiency wound on the right anterior lower leg. Admitted to clinic last week. He complains bitterly about the 3 layer compression we put on him stating it was uncomfortable he could not sleep. He has previously not tolerated stockings very well as well. His ABIs in this clinic were noncompressible however his dorsalis pedis and posterior tibial pulses are easily palpable. He has decent edema control. We used Hydrofera Blue which apparently stuck to the wound. There are good improvements in wound dimensions 11/18; the patient's venous insufficiency wound on the right anterior lower leg is almost all epithelialized. However he arrives in the clinic with 2 skin tears on his forearm from a fall at noon yesterday 11/04/2019 on evaluation today patient appears to be doing well with regard to his leg ulcer as well as his right forearm ulcers. He has been tolerating the dressing changes without complication. Fortunately there is no sign of active infection at this time. No fevers, chills, nausea, vomiting, or diarrhea. I am extremely pleased with how things seem to be progressing  based on what I am seeing today. 12/2; the patient's wound on the right anterior lower leg is healed. He has severe chronic venous insufficiency and has had previous ablations on this side. Previous ablations were done in [place]. He has not consistently worn compression stockings. He has a Tubigrip stocking on his  left leg. The areas that he injured on his right dorsal forearm have some surface scabbing however I think this is on its way to closure I do not think independently they need to have him continue in this clinic. Readmission: 01/19/2020 upon evaluation today patient presents for reevaluation after having been seen last here in the clinic on November 11, 2019. He was readmitted last year on October 14, 2019 here in the clinic concerning right lower extremity ulcers. He had ABIs performed at that time that appeared to be doing well. Overall though he was only able to tolerate a Curlex and Coban wrap anything stronger was too much for him. He is still smoking unfortunately and this is an ongoing issue. He really has no plans to quit. He also does not wear compression socks he tells me that he cannot afford them. With that being said obviously I am not sure that this is really the reason behind why he does not like the compression socks my gut tells me he may have difficulty getting them on as well. Fortunately there is no signs of active infection at this time he does have a small ulcer on the anterior which actually began surface of his leg Christmas Eve 2020 he has larger wounds on the posterior surface of his leg which are larger and actually began 2 weeks ago. He does have a history of congestive heart failure as noted by documentation. He also has venous insufficiency which is chronic as well as lymphedema. He also has hypertension. Stanley Marks, Stanley Marks ([id number]) 02/03/2020 patient presents today for follow-up though I have not seen him last since 01/19/2020. He ended up in the hospital secondary  to having issues with Covid. He was apparently very sick and not moving around a lot during that time. With that being said upon inspection today his wound actually appears to be doing significantly better compared to the previous evaluation even which is excellent news. The previous evaluation was the initial here in the clinic and at that time the wounds appeared to be doing much more severely in my opinion. Right now he has a lot of new skin growth which is excellent news. With that being said the wounds are also measuring significantly smaller and my hope is that although the hospital stay obviously was not ideal that it is good to help these areas to heal much more effectively and quickly. We were using Curlex and Coban wraps for him Which is about all he can really tolerate. 02/09/2020 upon evaluation today patient actually appears to be doing much better with regard to his ulcers on his left lower extremity. In general I feel like he is making excellent progress and the wounds in general seem to also be doing well from the standpoint of size as well as no evidence of infection. Objective Constitutional Well-nourished and well-hydrated in no acute distress. Vitals Time Taken: 8:45 AM, Height: 74 in, Weight: 290 lbs, BMI: 37.2, Temperature: 97.9 F, Pulse: 71 bpm, Respiratory Rate: 18 breaths/min, Blood Pressure: 117/62 mmHg. Respiratory normal breathing without difficulty. Psychiatric this patient is able to make decisions and demonstrates good insight into disease process. Alert and Oriented x 3. pleasant and cooperative. General Notes: Patient's wound bed currently showed signs of good granulation and epithelization at this point. There does not appear to be any evidence of active infection which is good news. No fevers, chills, nausea, vomiting, or diarrhea. Integumentary (Hair, Skin) Wound #6 status is Open. Original cause of wound was Blister.  The wound is located on the  Left,Proximal,Posterior Lower Leg. The wound measures 2.5cm length x 3.5cm width x 0.1cm depth; 6.872cm^2 area and 0.687cm^3 volume. There is Fat Layer (Subcutaneous Tissue) Exposed exposed. There is no tunneling or undermining noted. There is a medium amount of serosanguineous drainage noted. The wound margin is flat and intact. There is large (67-100%) red granulation within the wound bed. There is a small (1-33%) amount of necrotic tissue within the wound bed including Adherent Slough. Wound #7 status is Open. Original cause of wound was Blister. The wound is located on the Left,Distal,Posterior Lower Leg. The wound measures 1cm length x 1cm width x 0.1cm depth; 0.785cm^2 area and 0.079cm^3 volume. There is Fat Layer (Subcutaneous Tissue) Exposed exposed. There is no tunneling or undermining noted. There is a medium amount of serosanguineous drainage noted. The wound margin is flat and intact. There is large (67-100%) pink granulation within the wound bed. There is a small (1-33%) amount of necrotic tissue within the wound bed including Adherent Slough. Assessment Active Problems ICD-10 Venous insufficiency (chronic) (peripheral) Lymphedema, not elsewhere classified Non-pressure chronic ulcer of other part of left lower leg with fat layer exposed Essential (primary) hypertension Chronic combined systolic (congestive) and diastolic (congestive) heart failure Stanley Marks, Stanley Marks ([id number]) Plan Wound Cleansing: Wound #6 Left,Proximal,Posterior Lower Leg: Clean wound with Normal Saline. Cleanse wound with mild soap and water Wound #7 Left,Distal,Posterior Lower Leg: Clean wound with Normal Saline. Cleanse wound with mild soap and water Skin Barriers/Peri-Wound Care: Wound #6 Left,Proximal,Posterior Lower Leg: Moisturizing lotion Wound #7 Left,Distal,Posterior Lower Leg: Moisturizing lotion Primary Wound Dressing: Wound #6 Left,Proximal,Posterior Lower Leg: Silver Alginate Wound #7  Left,Distal,Posterior Lower Leg: Silver Alginate Secondary Dressing: Wound #6 Left,Proximal,Posterior Lower Leg: ABD pad Wound #7 Left,Distal,Posterior Lower Leg: ABD pad Dressing Change Frequency: Change dressing every week Follow-up Appointments: Return Appointment in 1 week. Edema Control: Wound #6 Left,Proximal,Posterior Lower Leg: Kerlix and Coban - Left Lower Extremity - anchor with unna Wound #7 Left,Distal,Posterior Lower Leg: Kerlix and Coban - Left Lower Extremity - anchor with unna 1. My suggestion currently is good to be that we continue with the silver cell along with the Curlex and Coban wrap as that seems to be doing well for him I think we can continue as such. 2. I am also going to suggest he continue to try to elevate his legs is much as possible obviously the more he can elevate the better he will do as well. 3. I would also recommend regarding the rash that he has on his legs pretty much from the waist down that he talk to his primary care provider about that when he sees them next week due to the fact that I am unsure if this is something medication related with the medicines that he was given at the hospital or something else totally unrelated to that possibly even related to his Covid infection. Either way this is not causing any problems right now and it does not appear to be cellulitis. We will see patient back for reevaluation in 1 week here in the clinic. If anything worsens or changes patient will contact our office for additional recommendations. Electronic Signature(s) Signed: 02/09/2020 9:24:49 AM By: Worthy Keeler PA-C Entered By: Worthy Keeler on 02/09/2020 09:24:49 Stanley Marks ([id number]) -------------------------------------------------------------------------------- SuperBill Details Patient Name: Stanley Marks Date of Service: 02/09/2020 Medical Record Number: [id number] Patient Account Number: [id number] Date of Birth/Sex: 18-Feb-1941 (78 y.o.  M) Treating RN: Montey Hora Primary Care Provider: Lamonte Sakai  Other Clinician: Referring Provider: Lamonte Sakai Treating Provider/Extender: Melburn Hake, Josmar Messimer Weeks in Treatment: 3 Diagnosis Coding ICD-10 Codes Code Description I87.2 Venous insufficiency (chronic) (peripheral) I89.0 Lymphedema, not elsewhere classified L97.822 Non-pressure chronic ulcer of other part of left lower leg with fat layer exposed I10 Essential (primary) hypertension I50.42 Chronic combined systolic (congestive) and diastolic (congestive) heart failure Facility Procedures CPT4 Code: [id number] Description: 99214 - WOUND CARE VISIT-LEV 4 EST PT Modifier: Quantity: 1 Physician Procedures CPT4 [id number] Description: [id number] - WC PHYS LEVEL 3 - EST PT Modifier: Quantity: 1 CPT4 Code: Description: ICD-10 Diagnosis Description 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

## 2020-02-15 ENCOUNTER — Other Ambulatory Visit: Payer: Self-pay

## 2020-02-15 ENCOUNTER — Ambulatory Visit
Admission: RE | Admit: 2020-02-15 | Discharge: 2020-02-15 | Disposition: A | Discharge status: Home / Self Care | Payer: Medicare Other | Source: Ambulatory Visit | Attending: Family | Admitting: Family

## 2020-02-15 DIAGNOSIS — M79601 Pain in right arm: Secondary | ICD-10-CM | POA: Insufficient documentation

## 2020-02-15 DIAGNOSIS — I8290 Acute embolism and thrombosis of unspecified vein: Secondary | ICD-10-CM

## 2020-02-16 ENCOUNTER — Encounter: Payer: Medicare Other | Admitting: Physician Assistant

## 2020-02-16 DIAGNOSIS — I872 Venous insufficiency (chronic) (peripheral): Secondary | ICD-10-CM | POA: Diagnosis not present

## 2020-02-16 NOTE — Progress Notes (Addendum)
Stanley Marks, Stanley Marks ([id number]) Visit Report for 02/16/2020 Chief Complaint Document Details Patient Name: Stanley Marks, Stanley Marks Date of Service: 02/16/2020 11:15 AM Medical Record Number: [id number] Patient Account Number: [id number] Date of Birth/Sex: [birth year] (79 y.o. M) Treating RN: Montey Hora Primary Care Provider: Lamonte Sakai Other Clinician: Referring Provider: Lamonte Sakai Treating Provider/Extender: Melburn Hake, Verlin Duke Weeks in Treatment: 4 Information Obtained from: Patient Chief Complaint Left LE Ulcers Electronic Signature(s) Signed: 02/16/2020 11:29:41 AM By: Worthy Keeler PA-C Entered By: Worthy Keeler on 02/16/2020 11:29:40 Stanley Marks ([id number]) -------------------------------------------------------------------------------- HPI Details Patient Name: Stanley Marks Date of Service: 02/16/2020 11:15 AM Medical Record Number: [id number] Patient Account Number: [id number] Date of Birth/Sex: 11-29-1941 (79 y.o. M) Treating RN: Montey Hora Primary Care Provider: Lamonte Sakai Other Clinician: Referring Provider: Lamonte Sakai Treating Provider/Extender: Melburn Hake, Kathee Tumlin Weeks in Treatment: 4 History of Present Illness HPI Description: Pleasant 79 year old with history of chronic venous insufficiency and hypertension. No History of diabetes or peripheral arterial disease. He says that he stopped taking his "fluid pill" so that he wouldn't have to urinate as often. He subsequently developed bilateral lower extremity swelling and left calf ulcerations in late August 2016. Status post bilateral lower extremity endovenous ablation over 5 years ago in [place]. He does not regularly wear compression stockings. Denies any history of DVT. He is ambulating per his baseline. No claudication or rest pain. Tolerated 3 layer compression bandage with Prisma with significant improvement. Recently declined compression bandages and has been using a Tubigrip for edema control. He returns to  clinic for follow-up and is without complaints. No significant pain. No fever or chills. No drainage. READMISSION 10/14/2019 This is a 79 year old man who is in this clinic for years ago with a venous insufficiency wound on the left leg. He was discharged with compression stockings but he does not wear them. He states he developed a blister on the right anterior tibial area in October. He was in the ER on 10/26 after falling and noted to have a wound on his right leg/shin although the patient states the blister was already present. He was felt possibly to be mildly dehydrated he declined IV fluid. X-ray did not show any osseous abnormalities however he was noted to have severe right knee osteoarthritis. The patient has known chronic venous insufficiency. He states he had ablations in his bilateral lower legs many years ago. This seems well verified from the history noted above. He is not a diabetic and does not have known PAD. Past medical history; no diabetes, atrial fibrillation on Xarelto, history of basal cell cancer, COPD continued smoker, hypertension, peripheral vascular disease. ABIs in our clinic were 1.36 on the right and 1.24 on the left 11/11; patient with a venous insufficiency wound on the right anterior lower leg. Admitted to clinic last week. He complains bitterly about the 3 layer compression we put on him stating it was uncomfortable he could not sleep. He has previously not tolerated stockings very well as well. His ABIs in this clinic were noncompressible however his dorsalis pedis and posterior tibial pulses are easily palpable. He has decent edema control. We used Hydrofera Blue which apparently stuck to the wound. There are good improvements in wound dimensions 11/18; the patient's venous insufficiency wound on the right anterior lower leg is almost all epithelialized. However he arrives in the clinic with 2 skin tears on his forearm from a fall at noon yesterday 11/04/2019 on  evaluation today patient appears to be doing well with regard to his leg  ulcer as well as his right forearm ulcers. He has been tolerating the dressing changes without complication. Fortunately there is no sign of active infection at this time. No fevers, chills, nausea, vomiting, or diarrhea. I am extremely pleased with how things seem to be progressing based on what I am seeing today. 12/2; the patient's wound on the right anterior lower leg is healed. He has severe chronic venous insufficiency and has had previous ablations on this side. Previous ablations were done in [place]. He has not consistently worn compression stockings. He has a Tubigrip stocking on his left leg. The areas that he injured on his right dorsal forearm have some surface scabbing however I think this is on its way to closure I do not think independently they need to have him continue in this clinic. Readmission: 01/19/2020 upon evaluation today patient presents for reevaluation after having been seen last here in the clinic on November 11, 2019. He was readmitted last year on October 14, 2019 here in the clinic concerning right lower extremity ulcers. He had ABIs performed at that time that appeared to be doing well. Overall though he was only able to tolerate a Curlex and Coban wrap anything stronger was too much for him. He is still smoking unfortunately and this is an ongoing issue. He really has no plans to quit. He also does not wear compression socks he tells me that he cannot afford them. With that being said obviously I am not sure that this is really the reason behind why he does not like the compression socks my gut tells me he may have difficulty getting them on as well. Fortunately there is no signs of active infection at this time he does have a small ulcer on the anterior which actually began surface of his leg Christmas Eve 2020 he has larger wounds on the posterior surface of his leg which are larger and actually  began 2 weeks ago. He does have a history of congestive heart failure as noted by documentation. He also has venous insufficiency which is chronic as well as lymphedema. He also has hypertension. 02/03/2020 patient presents today for follow-up though I have not seen him last since 01/19/2020. He ended up in the hospital secondary to having issues with Covid. He was apparently very sick and not moving around a lot during that time. With that being said upon inspection today his wound actually appears to be doing significantly better compared to the previous evaluation even which is excellent news. The previous evaluation was the initial here in the clinic and at that time the wounds appeared to be doing much more severely in my opinion. Right now he has a lot of new skin growth which is HERMES, BALLOW ([id number]) excellent news. With that being said the wounds are also measuring significantly smaller and my hope is that although the hospital stay obviously was not ideal that it is good to help these areas to heal much more effectively and quickly. We were using Curlex and Coban wraps for him Which is about all he can really tolerate. 02/09/2020 upon evaluation today patient actually appears to be doing much better with regard to his ulcers on his left lower extremity. In general I feel like he is making excellent progress and the wounds in general seem to also be doing well from the standpoint of size as well as no evidence of infection. 02/16/2020 upon evaluation today patient actually appears to be doing quite well with regard to his lower  extremity wounds. In fact he appears to be healed based on what I am seeing currently there is no signs of active infection at this time. No fevers, chills, nausea, vomiting, or diarrhea. Electronic Signature(s) Signed: 02/16/2020 2:13:35 PM By: Worthy Keeler PA-C Entered By: Worthy Keeler on 02/16/2020 14:13:35 Stanley Marks  ([id number]) -------------------------------------------------------------------------------- Physical Exam Details Patient Name: Stanley Marks Date of Service: 02/16/2020 11:15 AM Medical Record Number: [id number] Patient Account Number: [id number] Date of Birth/Sex: 08-22-1941 (78 y.o. M) Treating RN: Montey Hora Primary Care Provider: Lamonte Sakai Other Clinician: Referring Provider: Lamonte Sakai Treating Provider/Extender: Melburn Hake, Geovanni Rahming Weeks in Treatment: 4 Constitutional Obese and well-hydrated in no acute distress. Respiratory normal breathing without difficulty. Psychiatric this patient is able to make decisions and demonstrates good insight into disease process. Alert and Oriented x 3. pleasant and cooperative. Notes Infection patient showed good signs of epithelization at all wound locations and there is nothing actually open at this point though he does have weeping from the right lower extremity and again the left has just recently close therefore I would like to still wrap him due to lymphedema I do not want anything to reopen shortly. Electronic Signature(s) Signed: 02/16/2020 2:13:56 PM By: Worthy Keeler PA-C Entered By: Worthy Keeler on 02/16/2020 14:13:55 Stanley Marks ([id number]) -------------------------------------------------------------------------------- Physician Orders Details Patient Name: Stanley Marks Date of Service: 02/16/2020 11:15 AM Medical Record Number: [id number] Patient Account Number: [id number] Date of Birth/Sex: [birth year] (78 y.o. M) Treating RN: Montey Hora Primary Care Provider: Lamonte Sakai Other Clinician: Referring Provider: Lamonte Sakai Treating Provider/Extender: Melburn Hake, Daran Favaro Weeks in Treatment: 4 Verbal / Phone Orders: No Diagnosis Coding ICD-10 Coding Code Description I87.2 Venous insufficiency (chronic) (peripheral) I89.0 Lymphedema, not elsewhere classified L97.822 Non-pressure chronic ulcer of other part of left  lower leg with fat layer exposed I10 Essential (primary) hypertension I50.42 Chronic combined systolic (congestive) and diastolic (congestive) heart failure Secondary Dressing o ABD pad - if needed for any draining areas Dressing Change Frequency o Change dressing every week Follow-up Appointments o Return Appointment in 1 week. - Please bring your compression socks with you to your next appointment Edema Control o Kerlix and Coban - Bilateral - anchor with unna o Elevate legs to the level of the heart and pump ankles as often as possible o Other: - Please bring your compression socks with you to your next appointment Electronic Signature(s) Signed: 02/16/2020 4:36:26 PM By: Montey Hora Signed: 02/16/2020 4:47:23 PM By: Worthy Keeler PA-C Entered By: Montey Hora on 02/16/2020 11:44:56 Stanley Marks ([id number]) -------------------------------------------------------------------------------- Problem List Details Patient Name: Stanley Marks Date of Service: 02/16/2020 11:15 AM Medical Record Number: [id number] Patient Account Number: [id number] Date of Birth/Sex: 18-Dec-1940 (78 y.o. M) Treating RN: Montey Hora Primary Care Provider: Lamonte Sakai Other Clinician: Referring Provider: Lamonte Sakai Treating Provider/Extender: Melburn Hake, Farris Geiman Weeks in Treatment: 4 Active Problems ICD-10 Evaluated Encounter Code Description Active Date Today Diagnosis I87.2 Venous insufficiency (chronic) (peripheral) 01/19/2020 No Yes I89.0 Lymphedema, not elsewhere classified 01/19/2020 No Yes L97.822 Non-pressure chronic ulcer of other part of left lower leg with fat layer 01/19/2020 No Yes exposed [place] (primary) hypertension 01/19/2020 No Yes I50.42 Chronic combined systolic (congestive) and diastolic (congestive) heart 01/19/2020 No Yes failure Inactive Problems Resolved Problems Electronic Signature(s) Signed: 02/16/2020 11:29:33 AM By: Worthy Keeler PA-C Entered By: Worthy Keeler on 02/16/2020 11:29:32 Stanley Marks ([id number]) -------------------------------------------------------------------------------- Progress Note Details Patient Name: Stanley Marks Date of Service: 02/16/2020 11:15 AM Medical Record  Number: [id number] Patient Account Number: [id number] Date of Birth/Sex: 01/29/1941 (79 y.o. M) Treating RN: Montey Hora Primary Care Provider: Lamonte Sakai Other Clinician: Referring Provider: Lamonte Sakai Treating Provider/Extender: Melburn Hake, Arch Methot Weeks in Treatment: 4 Subjective Chief Complaint Information obtained from Patient Left LE Ulcers History of Present Illness (HPI) Pleasant 79 year old with history of chronic venous insufficiency and hypertension. No History of diabetes or peripheral arterial disease. He says that he stopped taking his "fluid pill" so that he wouldn't have to urinate as often. He subsequently developed bilateral lower extremity swelling and left calf ulcerations in late August 2016. Status post bilateral lower extremity endovenous ablation over 5 years ago in [place]. He does not regularly wear compression stockings. Denies any history of DVT. He is ambulating per his baseline. No claudication or rest pain. Tolerated 3 layer compression bandage with Prisma with significant improvement. Recently declined compression bandages and has been using a Tubigrip for edema control. He returns to clinic for follow-up and is without complaints. No significant pain. No fever or chills. No drainage. READMISSION 10/14/2019 This is a 79 year old man who is in this clinic for years ago with a venous insufficiency wound on the left leg. He was discharged with compression stockings but he does not wear them. He states he developed a blister on the right anterior tibial area in October. He was in the ER on 10/26 after falling and noted to have a wound on his right leg/shin although the patient states the blister was already present. He  was felt possibly to be mildly dehydrated he declined IV fluid. X-ray did not show any osseous abnormalities however he was noted to have severe right knee osteoarthritis. The patient has known chronic venous insufficiency. He states he had ablations in his bilateral lower legs many years ago. This seems well verified from the history noted above. He is not a diabetic and does not have known PAD. Past medical history; no diabetes, atrial fibrillation on Xarelto, history of basal cell cancer, COPD continued smoker, hypertension, peripheral vascular disease. ABIs in our clinic were 1.36 on the right and 1.24 on the left 11/11; patient with a venous insufficiency wound on the right anterior lower leg. Admitted to clinic last week. He complains bitterly about the 3 layer compression we put on him stating it was uncomfortable he could not sleep. He has previously not tolerated stockings very well as well. His ABIs in this clinic were noncompressible however his dorsalis pedis and posterior tibial pulses are easily palpable. He has decent edema control. We used Hydrofera Blue which apparently stuck to the wound. There are good improvements in wound dimensions 11/18; the patient's venous insufficiency wound on the right anterior lower leg is almost all epithelialized. However he arrives in the clinic with 2 skin tears on his forearm from a fall at noon yesterday 11/04/2019 on evaluation today patient appears to be doing well with regard to his leg ulcer as well as his right forearm ulcers. He has been tolerating the dressing changes without complication. Fortunately there is no sign of active infection at this time. No fevers, chills, nausea, vomiting, or diarrhea. I am extremely pleased with how things seem to be progressing based on what I am seeing today. 12/2; the patient's wound on the right anterior lower leg is healed. He has severe chronic venous insufficiency and has had previous ablations on  this side. Previous ablations were done in [place]. He has not consistently worn compression stockings. He has a Tubigrip stocking  on his left leg. The areas that he injured on his right dorsal forearm have some surface scabbing however I think this is on its way to closure I do not think independently they need to have him continue in this clinic. Readmission: 01/19/2020 upon evaluation today patient presents for reevaluation after having been seen last here in the clinic on November 11, 2019. He was readmitted last year on October 14, 2019 here in the clinic concerning right lower extremity ulcers. He had ABIs performed at that time that appeared to be doing well. Overall though he was only able to tolerate a Curlex and Coban wrap anything stronger was too much for him. He is still smoking unfortunately and this is an ongoing issue. He really has no plans to quit. He also does not wear compression socks he tells me that he cannot afford them. With that being said obviously I am not sure that this is really the reason behind why he does not like the compression socks my gut tells me he may have difficulty getting them on as well. Fortunately there is no signs of active infection at this time he does have a small ulcer on the anterior which actually began surface of his leg Christmas Eve 2020 he has larger wounds on the posterior surface of his leg which are larger and actually began 2 weeks ago. He does have a history of congestive heart failure as noted by documentation. He also has venous insufficiency which is chronic as well as lymphedema. He also has hypertension. Stanley Marks, Stanley Marks ([id number]) 02/03/2020 patient presents today for follow-up though I have not seen him last since 01/19/2020. He ended up in the hospital secondary to having issues with Covid. He was apparently very sick and not moving around a lot during that time. With that being said upon inspection today his wound actually appears to  be doing significantly better compared to the previous evaluation even which is excellent news. The previous evaluation was the initial here in the clinic and at that time the wounds appeared to be doing much more severely in my opinion. Right now he has a lot of new skin growth which is excellent news. With that being said the wounds are also measuring significantly smaller and my hope is that although the hospital stay obviously was not ideal that it is good to help these areas to heal much more effectively and quickly. We were using Curlex and Coban wraps for him Which is about all he can really tolerate. 02/09/2020 upon evaluation today patient actually appears to be doing much better with regard to his ulcers on his left lower extremity. In general I feel like he is making excellent progress and the wounds in general seem to also be doing well from the standpoint of size as well as no evidence of infection. 02/16/2020 upon evaluation today patient actually appears to be doing quite well with regard to his lower extremity wounds. In fact he appears to be healed based on what I am seeing currently there is no signs of active infection at this time. No fevers, chills, nausea, vomiting, or diarrhea. Objective Constitutional Obese and well-hydrated in no acute distress. Vitals Time Taken: 11:19 AM, Height: 74 in, Weight: 290 lbs, BMI: 37.2, Temperature: 98.2 F, Pulse: 95 bpm, Respiratory Rate: 16 breaths/min, Blood Pressure: 130/88 mmHg. Respiratory normal breathing without difficulty. Psychiatric this patient is able to make decisions and demonstrates good insight into disease process. Alert and Oriented x 3. pleasant and cooperative.  General Notes: Infection patient showed good signs of epithelization at all wound locations and there is nothing actually open at this point though he does have weeping from the right lower extremity and again the left has just recently close therefore I would like  to still wrap him due to lymphedema I do not want anything to reopen shortly. Integumentary (Hair, Skin) Wound #6 status is Healed - Epithelialized. Original cause of wound was Blister. The wound is located on the Left,Proximal,Posterior Lower Leg. The wound measures 0cm length x 0cm width x 0cm depth; 0cm^2 area and 0cm^3 volume. There is Fat Layer (Subcutaneous Tissue) Exposed exposed. There is no tunneling or undermining noted. There is a medium amount of serosanguineous drainage noted. The wound margin is flat and intact. There is large (67-100%) red granulation within the wound bed. There is a small (1-33%) amount of necrotic tissue within the wound bed including Adherent Slough. Wound #7 status is Healed - Epithelialized. Original cause of wound was Blister. The wound is located on the Left,Distal,Posterior Lower Leg. The wound measures 0cm length x 0cm width x 0cm depth; 0cm^2 area and 0cm^3 volume. There is Fat Layer (Subcutaneous Tissue) Exposed exposed. There is a medium amount of serosanguineous drainage noted. The wound margin is flat and intact. There is large (67-100%) pink granulation within the wound bed. There is a small (1-33%) amount of necrotic tissue within the wound bed including Adherent Slough. Other Condition(s) Patient presents with Lymphedema located on the Bilateral Leg. Assessment Active Problems ICD-10 Venous insufficiency (chronic) (peripheral) Lymphedema, not elsewhere classified Non-pressure chronic ulcer of other part of left lower leg with fat layer exposed Essential (primary) hypertension Stanley Marks, Stanley Marks ([id number]) Chronic combined systolic (congestive) and diastolic (congestive) heart failure Plan Secondary Dressing: ABD pad - if needed for any draining areas Dressing Change Frequency: Change dressing every week Follow-up Appointments: Return Appointment in 1 week. - Please bring your compression socks with you to your next appointment Edema  Control: Kerlix and Coban - Bilateral - anchor with unna Elevate legs to the level of the heart and pump ankles as often as possible Other: - Please bring your compression socks with you to your next appointment 1. I would recommend currently that we go ahead and continue with the current wraps which is the Curlex and Coban bilaterally with Unna to anchor as the patient seems to be doing well with that currently. 2. I am also going to recommend that we go ahead and have him pick up compression socks that he needs to wear on a regular basis as can be of utmost importance to keep things under control and keep them from weeping. 3. I am also can recommend the patient needs to elevate his legs as much as possible and continue to take his torsemide he does not need to stop this he tells me he did recently and thus what caused the right to start weeping again. We will see patient back for reevaluation in 1 week here in the clinic. If anything worsens or changes patient will contact our office for additional recommendations. Electronic Signature(s) Signed: 02/16/2020 2:15:06 PM By: Worthy Keeler PA-C Entered By: Worthy Keeler on 02/16/2020 14:15:05 Stanley Marks ([id number]) -------------------------------------------------------------------------------- SuperBill Details Patient Name: Stanley Marks Date of Service: 02/16/2020 Medical Record Number: [id number] Patient Account Number: [id number] Date of Birth/Sex: 1941/08/28 (78 y.o. M) Treating RN: Montey Hora Primary Care Provider: Lamonte Sakai Other Clinician: Referring Provider: Lamonte Sakai Treating Provider/Extender: Melburn Hake, Tera Pellicane Weeks in Treatment:  4 Diagnosis Coding ICD-10 Codes Code Description I87.2 Venous insufficiency (chronic) (peripheral) I89.0 Lymphedema, not elsewhere classified L97.822 Non-pressure chronic ulcer of other part of left lower leg with fat layer exposed I10 Essential (primary) hypertension I50.42 Chronic  combined systolic (congestive) and diastolic (congestive) heart failure Facility Procedures CPT4 Code: [id number] Description: 99214 - WOUND CARE VISIT-LEV 4 EST PT Modifier: Quantity: 1 Physician Procedures CPT4 Code: [id number] Description: [id number] - WC PHYS LEVEL 3 - EST PT Modifier: Quantity: 1 CPT4 Code: Description: ICD-10 Diagnosis Description I87.2 Venous insufficiency (chronic) (peripheral) I89.0 Lymphedema, not elsewhere classified L97.822 Non-pressure chronic ulcer of other part of left lower leg with fat layer e I10 Essential (primary)  hypertension Modifier: xposed Quantity: Electronic Signature(s) Signed: 02/16/2020 2:15:23 PM By: Worthy Keeler PA-C Entered By: Worthy Keeler on 02/16/2020 14:15:23

## 2020-02-17 NOTE — Progress Notes (Signed)
Stanley Marks, Stanley Marks ([id number]) Visit Report for 02/16/2020 Arrival Information Details Patient Name: Stanley Marks, Stanley Marks Date of Service: 02/16/2020 11:15 AM Medical Record Number: [id number] Patient Account Number: [id number] Date of Birth/Sex: [birth year] (79 y.o. M) Treating RN: Army Melia Primary Care Jacarri Gesner: Lamonte Sakai Other Clinician: Referring Wilman Tucker: Lamonte Sakai Treating Jahfari Ambers/Extender: Melburn Hake, HOYT Weeks in Treatment: 4 Visit Information History Since Last Visit Added or deleted any medications: No Patient Arrived: Ambulatory Any new allergies or adverse reactions: No Arrival Time: 11:19 Had a fall or experienced change in No Accompanied By: self activities of daily living that may affect Transfer Assistance: None risk of falls: Patient Identification Verified: Yes Signs or symptoms of abuse/neglect since last visito No Hospitalized since last visit: No Has Dressing in Place as Prescribed: Yes Pain Present Now: No Electronic Signature(s) Signed: 02/17/2020 8:09:31 AM By: Army Melia Entered By: Army Melia on 02/16/2020 11:19:52 Stanley Marks ([id number]) -------------------------------------------------------------------------------- Clinic Level of Care Assessment Details Patient Name: Stanley Marks Date of Service: 02/16/2020 11:15 AM Medical Record Number: [id number] Patient Account Number: [id number] Date of Birth/Sex: [birth year] (79 y.o. M) Treating RN: Montey Hora Primary Care Isaah Furry: Lamonte Sakai Other Clinician: Referring Brissa Asante: Lamonte Sakai Treating Korin Setzler/Extender: Melburn Hake, HOYT Weeks in Treatment: 4 Clinic Level of Care Assessment Items TOOL 4 Quantity Score []  - Use when only an EandM is performed on FOLLOW-UP visit 0 ASSESSMENTS - Nursing Assessment / Reassessment X - Reassessment of Co-morbidities (includes updates in patient status) 1 10 X- 1 5 Reassessment of Adherence to Treatment Plan ASSESSMENTS - Wound and Skin Assessment /  Reassessment []  - Simple Wound Assessment / Reassessment - one wound 0 X- 2 5 Complex Wound Assessment / Reassessment - multiple wounds []  - 0 Dermatologic / Skin Assessment (not related to wound area) ASSESSMENTS - Focused Assessment X - Circumferential Edema Measurements - multi extremities 2 5 []  - 0 Nutritional Assessment / Counseling / Intervention X- 1 5 Lower Extremity Assessment (monofilament, tuning fork, pulses) []  - 0 Peripheral Arterial Disease Assessment (using hand held doppler) ASSESSMENTS - Ostomy and/or Continence Assessment and Care []  - Incontinence Assessment and Management 0 []  - 0 Ostomy Care Assessment and Management (repouching, etc.) PROCESS - Coordination of Care X - Simple Patient / Family Education for ongoing care 1 15 []  - 0 Complex (extensive) Patient / Family Education for ongoing care X- 1 10 Staff obtains Programmer, systems, Records, Test Results / Process Orders []  - 0 Staff telephones HHA, Nursing Homes / Clarify orders / etc []  - 0 Routine Transfer to another Facility (non-emergent condition) []  - 0 Routine Hospital Admission (non-emergent condition) []  - 0 New Admissions / Biomedical engineer / Ordering NPWT, Apligraf, etc. []  - 0 Emergency Hospital Admission (emergent condition) X- 1 10 Simple Discharge Coordination []  - 0 Complex (extensive) Discharge Coordination PROCESS - Special Needs []  - Pediatric / Minor Patient Management 0 []  - 0 Isolation Patient Management []  - 0 Hearing / Language / Visual special needs []  - 0 Assessment of Community assistance (transportation, D/C planning, etc.) Stanley Marks, Stanley Marks ([id number]) []  - 0 Additional assistance / Altered mentation []  - 0 Support Surface(s) Assessment (bed, cushion, seat, etc.) INTERVENTIONS - Wound Cleansing / Measurement []  - Simple Wound Cleansing - one wound 0 X- 2 5 Complex Wound Cleansing - multiple wounds X- 1 5 Wound Imaging (photographs - any number of wounds) []  -  0 Wound Tracing (instead of photographs) []  - 0 Simple Wound Measurement - one wound X- 2 5 Complex Wound Measurement -  multiple wounds INTERVENTIONS - Wound Dressings []  - Small Wound Dressing one or multiple wounds 0 []  - 0 Medium Wound Dressing one or multiple wounds X- 2 20 Large Wound Dressing one or multiple wounds []  - 0 Application of Medications - topical []  - 0 Application of Medications - injection INTERVENTIONS - Miscellaneous []  - External ear exam 0 []  - 0 Specimen Collection (cultures, biopsies, blood, body fluids, etc.) []  - 0 Specimen(s) / Culture(s) sent or taken to Lab for analysis []  - 0 Patient Transfer (multiple staff / Civil Service fast streamer / Similar devices) []  - 0 Simple Staple / Suture removal (25 or less) []  - 0 Complex Staple / Suture removal (26 or more) []  - 0 Hypo / Hyperglycemic Management (close monitor of Blood Glucose) []  - 0 Ankle / Brachial Index (ABI) - do not check if billed separately X- 1 5 Vital Signs Has the patient been seen at the hospital within the last three years: Yes Total Score: 145 Level Of Care: New/Established - Level 4 Electronic Signature(s) Signed: 02/16/2020 4:36:26 PM By: Montey Hora Entered By: Montey Hora on 02/16/2020 11:45:43 Stanley Marks ([id number]) -------------------------------------------------------------------------------- Encounter Discharge Information Details Patient Name: Stanley Marks Date of Service: 02/16/2020 11:15 AM Medical Record Number: [id number] Patient Account Number: [id number] Date of Birth/Sex: 1941-02-15 (79 y.o. M) Treating RN: Montey Hora Primary Care Ival Pacer: Lamonte Sakai Other Clinician: Referring Jaianna Nicoll: Lamonte Sakai Treating Dondi Burandt/Extender: Melburn Hake, HOYT Weeks in Treatment: 4 Encounter Discharge Information Items Discharge Condition: Stable Ambulatory Status: Walker Discharge Destination: Home Transportation: Private Auto Accompanied By: self Schedule Follow-up  Appointment: Yes Clinical Summary of Care: Electronic Signature(s) Signed: 02/16/2020 4:36:26 PM By: Montey Hora Entered By: Montey Hora on 02/16/2020 11:46:28 Stanley Marks ([id number]) -------------------------------------------------------------------------------- Lower Extremity Assessment Details Patient Name: Stanley Marks Date of Service: 02/16/2020 11:15 AM Medical Record Number: [id number] Patient Account Number: [id number] Date of Birth/Sex: 02-26-1941 (79 y.o. M) Treating RN: Army Melia Primary Care Haely Leyland: Lamonte Sakai Other Clinician: Referring Yaacov Koziol: Lamonte Sakai Treating Myndi Wamble/Extender: Melburn Hake, HOYT Weeks in Treatment: 4 Edema Assessment Assessed: [Left: No] [Right: No] Edema: [Left: Yes] [Right: Yes] Calf Left: Right: Point of Measurement: 35 cm From Medial Instep 49 cm 47 cm Ankle Left: Right: Point of Measurement: 13 cm From Medial Instep 27 cm 27 cm Vascular Assessment Pulses: Dorsalis Pedis Palpable: [Left:Yes] [Right:Yes] Electronic Signature(s) Signed: 02/17/2020 8:09:31 AM By: Army Melia Entered By: Army Melia on 02/16/2020 11:35:03 Stanley Marks ([id number]) -------------------------------------------------------------------------------- Multi Wound Chart Details Patient Name: Stanley Marks Date of Service: 02/16/2020 11:15 AM Medical Record Number: [id number] Patient Account Number: [id number] Date of Birth/Sex: 1941/07/25 (79 y.o. M) Treating RN: Montey Hora Primary Care Markail Diekman: Lamonte Sakai Other Clinician: Referring Vallery Mcdade: Lamonte Sakai Treating Tessah Patchen/Extender: Melburn Hake, HOYT Weeks in Treatment: 4 Vital Signs Height(in): 74 Pulse(bpm): 95 Weight(lbs): 290 Blood Pressure(mmHg): 130/88 Body Mass Index(BMI): 37 Temperature(F): 98.2 Respiratory Rate(breaths/min): 16 Photos: [N/A:N/A] Wound Location: Left, Proximal, Posterior Lower Leg Left, Distal, Posterior Lower Leg N/A Wounding Event: Blister Blister  N/A Primary Etiology: Venous Leg Ulcer Venous Leg Ulcer N/A Comorbid History: Cataracts, Chronic Obstructive Cataracts, Chronic Obstructive N/A Pulmonary Disease (COPD), Pulmonary Disease (COPD), Arrhythmia, Congestive Heart Arrhythmia, Congestive Heart Failure, Hypertension, Osteomyelitis Failure, Hypertension, Osteomyelitis Date Acquired: 12/30/2019 12/30/2019 N/A Weeks of Treatment: 4 4 N/A Wound Status: Healed - Epithelialized Healed - Epithelialized N/A Measurements L x W x D (cm) 0x0x0 0x0x0 N/A Area (cm) : 0 0 N/A Volume (cm) : 0 0 N/A % Reduction in Area: 100.00% 100.00% N/A %  Reduction in Volume: 100.00% 100.00% N/A Classification: Full Thickness Without Exposed Full Thickness Without Exposed N/A Support Structures Support Structures Exudate Amount: Medium Medium N/A Exudate Type: Serosanguineous Serosanguineous N/A Exudate Color: red, brown red, brown N/A Wound Margin: Flat and Intact Flat and Intact N/A Granulation Amount: Large (67-100%) Large (67-100%) N/A Granulation Quality: Red Pink N/A Necrotic Amount: Small (1-33%) Small (1-33%) N/A Exposed Structures: Fat Layer (Subcutaneous Tissue) Fat Layer (Subcutaneous Tissue) N/A Exposed: Yes Exposed: Yes Fascia: No Fascia: No Tendon: No Tendon: No Muscle: No Muscle: No Joint: No Joint: No Bone: No Bone: No Epithelialization: Medium (34-66%) Large (67-100%) N/A Treatment Notes Electronic Signature(s) Signed: 02/16/2020 4:36:26 PM By: Josetta Huddle ([id number]) Entered By: Montey Hora on 02/16/2020 11:43:03 Stanley Marks ([id number]) -------------------------------------------------------------------------------- Multi-Disciplinary Care Plan Details Patient Name: Stanley Marks Date of Service: 02/16/2020 11:15 AM Medical Record Number: [id number] Patient Account Number: [id number] Date of Birth/Sex: 1941-09-19 (79 y.o. M) Treating RN: Montey Hora Primary Care Mandeep Ferch: Lamonte Sakai Other  Clinician: Referring Huck Ashworth: Lamonte Sakai Treating Tasean Mancha/Extender: Melburn Hake, HOYT Weeks in Treatment: 4 Active Inactive Abuse / Safety / Falls / Self Care Management Nursing Diagnoses: Potential for falls Goals: Patient will remain injury free related to falls Date Initiated: 01/19/2020 Target Resolution Date: 04/16/2020 Goal Status: Active Interventions: Assess fall risk on admission and as needed Notes: Orientation to the Wound Care Program Nursing Diagnoses: Knowledge deficit related to the wound healing center program Goals: Patient/caregiver will verbalize understanding of the [facility] Date Initiated: 01/19/2020 Target Resolution Date: 04/16/2020 Goal Status: Active Interventions: Provide education on orientation to the wound center Notes: Venous Leg Ulcer Nursing Diagnoses: Potential for venous Insuffiency (use before diagnosis confirmed) Goals: Patient will maintain optimal edema control Date Initiated: 01/19/2020 Target Resolution Date: 04/16/2020 Goal Status: Active Interventions: Provide education on venous insufficiency Notes: Wound/Skin Impairment Nursing Diagnoses: Impaired tissue integrity Stanley Marks, Stanley Marks ([id number]) Goals: Ulcer/skin breakdown will heal within 14 weeks Date Initiated: 01/19/2020 Target Resolution Date: 04/16/2020 Goal Status: Active Interventions: Assess patient/caregiver ability to obtain necessary supplies Assess patient/caregiver ability to perform ulcer/skin care regimen upon admission and as needed Assess ulceration(s) every visit Notes: Electronic Signature(s) Signed: 02/16/2020 4:36:26 PM By: Montey Hora Entered By: Montey Hora on 02/16/2020 11:42:54 Stanley Marks ([id number]) -------------------------------------------------------------------------------- Non-Wound Condition Assessment Details Patient Name: Stanley Marks Date of Service: 02/16/2020 11:15 AM Medical Record Number: [id number] Patient Account  Number: [id number] Date of Birth/Sex: Jan 23, 1941 (79 y.o. M) Treating RN: Montey Hora Primary Care Antoniette Peake: Lamonte Sakai Other Clinician: Referring Charle Clear: Lamonte Sakai Treating Rekia Kujala/Extender: Melburn Hake, HOYT Weeks in Treatment: 4 Non-Wound Condition: Condition: Lymphedema Location: Leg Side: Bilateral Photos Electronic Signature(s) Signed: 02/16/2020 4:36:26 PM By: Montey Hora Entered By: Montey Hora on 02/16/2020 11:52:28 Stanley Marks ([id number]) -------------------------------------------------------------------------------- Pain Assessment Details Patient Name: Stanley Marks Date of Service: 02/16/2020 11:15 AM Medical Record Number: [id number] Patient Account Number: [id number] Date of Birth/Sex: [birth year] (79 y.o. M) Treating RN: Army Melia Primary Care Bintou Lafata: Lamonte Sakai Other Clinician: Referring Micharl Helmes: Lamonte Sakai Treating Tiya Schrupp/Extender: Melburn Hake, HOYT Weeks in Treatment: 4 Active Problems Location of Pain Severity and Description of Pain Patient Has Paino No Site Locations Pain Management and Medication Current Pain Management: Electronic Signature(s) Signed: 02/17/2020 8:09:31 AM By: Army Melia Entered By: Army Melia on 02/16/2020 11:23:18 Stanley Marks ([id number]) -------------------------------------------------------------------------------- Patient/Caregiver Education Details Patient Name: Stanley Marks Date of Service: 02/16/2020 11:15 AM Medical Record Number: [id number] Patient Account Number: [id number] Date of Birth/Gender: 07/27/1941 (79 y.o. M) Treating RN: Montey Hora Primary  Care Physician: Lamonte Sakai Other Clinician: Referring Physician: Lamonte Sakai Treating Physician/Extender: Sharalyn Ink in Treatment: 4 Education Assessment Education Provided To: Patient Education Topics Provided Venous: Handouts: Other: need for ongoing compression Methods: Explain/Verbal Responses: State content  correctly Electronic Signature(s) Signed: 02/16/2020 4:36:26 PM By: Montey Hora Entered By: Montey Hora on 02/16/2020 11:46:10 Stanley Marks ([id number]) -------------------------------------------------------------------------------- Wound Assessment Details Patient Name: Stanley Marks Date of Service: 02/16/2020 11:15 AM Medical Record Number: [id number] Patient Account Number: [id number] Date of Birth/Sex: [birth year] (79 y.o. M) Treating RN: Montey Hora Primary Care Kosha Jaquith: Lamonte Sakai Other Clinician: Referring Jenene Kauffmann: Lamonte Sakai Treating Matheson Vandehei/Extender: Melburn Hake, HOYT Weeks in Treatment: 4 Wound Status Wound Number: 6 Primary Venous Leg Ulcer Etiology: Wound Location: Left, Proximal, Posterior Lower Leg Wound Healed - Epithelialized Wounding Event: Blister Status: Date Acquired: 12/30/2019 Comorbid Cataracts, Chronic Obstructive Pulmonary Disease (COPD), Weeks Of Treatment: 4 History: Arrhythmia, Congestive Heart Failure, Hypertension, Clustered Wound: No Osteomyelitis Photos Wound Measurements Length: (cm) Width: (cm) Depth: (cm) Area: (cm) Volume: (cm) 0 % Reduction in Area: 100% 0 % Reduction in Volume: 100% 0 Epithelialization: Medium (34-66%) 0 Tunneling: No 0 Undermining: No Wound Description Classification: Full Thickness Without Exposed Support Structu Wound Margin: Flat and Intact Exudate Amount: Medium Exudate Type: Serosanguineous Exudate Color: red, brown res Foul Odor After Cleansing: No Slough/Fibrino Yes Wound Bed Granulation Amount: Large (67-100%) Exposed Structure Granulation Quality: Red Fascia Exposed: No Necrotic Amount: Small (1-33%) Fat Layer (Subcutaneous Tissue) Exposed: Yes Necrotic Quality: Adherent Slough Tendon Exposed: No Muscle Exposed: No Joint Exposed: No Bone Exposed: No Electronic Signature(s) Signed: 02/16/2020 4:36:26 PM By: Montey Hora Entered By: Montey Hora on 02/16/2020 11:41:22 Stanley Marks ([id number]) -------------------------------------------------------------------------------- Wound Assessment Details Patient Name: Stanley Marks Date of Service: 02/16/2020 11:15 AM Medical Record Number: [id number] Patient Account Number: [id number] Date of Birth/Sex: 1941/03/14 (79 y.o. M) Treating RN: Montey Hora Primary Care Raedyn Klinck: Lamonte Sakai Other Clinician: Referring Lavontay [name]: Lamonte Sakai Treating Alette Kataoka/Extender: Melburn Hake, HOYT Weeks in Treatment: 4 Wound Status Wound Number: 7 Primary Venous Leg Ulcer Etiology: Wound Location: Left, Distal, Posterior Lower Leg Wound Healed - Epithelialized Wounding Event: Blister Status: Date Acquired: 12/30/2019 Comorbid Cataracts, Chronic Obstructive Pulmonary Disease (COPD), Weeks Of Treatment: 4 History: Arrhythmia, Congestive Heart Failure, Hypertension, Clustered Wound: No Osteomyelitis Photos Wound Measurements Length: (cm) Width: (cm) Depth: (cm) Area: (cm) Volume: (cm) 0 % Reduction in Area: 100% 0 % Reduction in Volume: 100% 0 Epithelialization: Large (67-100%) 0 0 Wound Description Classification: Full Thickness Without Exposed Support Structu Wound Margin: Flat and Intact Exudate Amount: Medium Exudate Type: Serosanguineous Exudate Color: red, brown res Foul Odor After Cleansing: No Slough/Fibrino Yes Wound Bed Granulation Amount: Large (67-100%) Exposed Structure Granulation Quality: Pink Fascia Exposed: No Necrotic Amount: Small (1-33%) Fat Layer (Subcutaneous Tissue) Exposed: Yes Necrotic Quality: Adherent Slough Tendon Exposed: No Muscle Exposed: No Joint Exposed: No Bone Exposed: No Electronic Signature(s) Signed: 02/16/2020 4:36:26 PM By: Montey Hora Entered By: Montey Hora on 02/16/2020 11:41:31 Stanley Marks ([id number]) -------------------------------------------------------------------------------- Vitals Details Patient Name: Stanley Marks Date of Service: 02/16/2020  11:15 AM Medical Record Number: [id number] Patient Account Number: [id number] Date of Birth/Sex: Apr 06, 1941 (79 y.o. M) Treating RN: Army Melia Primary Care Jebediah Macrae: Lamonte Sakai Other Clinician: Referring Toran Murch: Lamonte Sakai Treating Mystique Bjelland/Extender: Melburn Hake, HOYT Weeks in Treatment: 4 Vital Signs Time Taken: 11:19 Temperature (F): 98.2 Height (in): 74 Pulse (bpm): 95 Weight (lbs): 290 Respiratory Rate (breaths/min): 16 Body Mass Index (BMI): 37.2 Blood Pressure (mmHg): 130/88 Reference Range: 80 -  120 mg / dl Electronic Signature(s) Signed: 02/17/2020 8:09:31 AM By: Army Melia Entered By: Army Melia on 02/16/2020 11:23:12

## 2020-02-23 ENCOUNTER — Encounter: Payer: Medicare Other | Admitting: Physician Assistant

## 2020-02-23 ENCOUNTER — Other Ambulatory Visit: Payer: Self-pay

## 2020-02-23 DIAGNOSIS — I872 Venous insufficiency (chronic) (peripheral): Secondary | ICD-10-CM | POA: Diagnosis not present

## 2020-02-23 NOTE — Progress Notes (Signed)
Stanley Marks ([id number]) Visit Report for 02/23/2020 Arrival Information Details Patient Name: Stanley Marks, Stanley Marks Date of Service: 02/23/2020 9:30 AM Medical Record Number: [id number] Patient Account Number: [id number] Date of Birth/Sex: 1941/05/28 (79 y.o. M) Treating RN: Army Melia Primary Care Primo Innis: Lamonte Sakai Other Clinician: Referring Jamaya Sleeth: Lamonte Sakai Treating Johnay Mano/Extender: Melburn Hake, HOYT Weeks in Treatment: 5 Visit Information History Since Last Visit Added or deleted any medications: No Patient Arrived: Walker Any new allergies or adverse reactions: No Arrival Time: 09:47 Had a fall or experienced change in No Accompanied By: family activities of daily living that may affect Transfer Assistance: None risk of falls: Patient Identification Verified: Yes Signs or symptoms of abuse/neglect since last visito No Hospitalized since last visit: No Has Dressing in Place as Prescribed: Yes Pain Present Now: No Electronic Signature(s) Signed: 02/23/2020 10:13:19 AM By: Army Melia Entered By: Army Melia on 02/23/2020 09:47:19 Stanley Marks ([id number]) -------------------------------------------------------------------------------- Compression Therapy Details Patient Name: Stanley Marks Date of Service: 02/23/2020 9:30 AM Medical Record Number: [id number] Patient Account Number: [id number] Date of Birth/Sex: 1941-01-30 (79 y.o. M) Treating RN: Montey Hora Primary Care Elson Ulbrich: Lamonte Sakai Other Clinician: Referring Rachael Ferrie: Lamonte Sakai Treating Royal Beirne/Extender: Melburn Hake, HOYT Weeks in Treatment: 5 Compression Therapy Performed for Wound Assessment: Wound #9 Right,Anterior Lower Leg Performed By: Clinician Montey Hora, RN Compression Type: Three Layer Pre Treatment ABI: 1.2 Post Procedure Diagnosis Same as Pre-procedure Electronic Signature(s) Signed: 02/23/2020 2:58:07 PM By: Montey Hora Entered By: Montey Hora on 02/23/2020  10:13:16 Stanley Marks ([id number]) -------------------------------------------------------------------------------- Encounter Discharge Information Details Patient Name: Stanley Marks Date of Service: 02/23/2020 9:30 AM Medical Record Number: [id number] Patient Account Number: [id number] Date of Birth/Sex: 08/04/1941 (79 y.o. M) Treating RN: Montey Hora Primary Care Damarrion Mimbs: Lamonte Sakai Other Clinician: Referring Shabazz Mckey: Lamonte Sakai Treating Aastha Dayley/Extender: Melburn Hake, HOYT Weeks in Treatment: 5 Encounter Discharge Information Items Discharge Condition: Stable Ambulatory Status: Walker Discharge Destination: Home Transportation: Private Auto Accompanied By: granddaughter Schedule Follow-up Appointment: Yes Clinical Summary of Care: Electronic Signature(s) Signed: 02/23/2020 2:58:07 PM By: Montey Hora Entered By: Montey Hora on 02/23/2020 10:16:02 Stanley Marks ([id number]) -------------------------------------------------------------------------------- Lower Extremity Assessment Details Patient Name: Stanley Marks Date of Service: 02/23/2020 9:30 AM Medical Record Number: [id number] Patient Account Number: [id number] Date of Birth/Sex: 04/06/1941 (79 y.o. M) Treating RN: Army Melia Primary Care Zared Knoth: Lamonte Sakai Other Clinician: Referring Lacretia Tindall: Lamonte Sakai Treating Laberta Wilbon/Extender: Melburn Hake, HOYT Weeks in Treatment: 5 Edema Assessment Assessed: [Left: No] [Right: No] Edema: [Left: Yes] [Right: Yes] Calf Left: Right: Point of Measurement: 35 cm From Medial Instep 49 cm 47 cm Ankle Left: Right: Point of Measurement: 13 cm From Medial Instep 27 cm 27 cm Vascular Assessment Pulses: Dorsalis Pedis Palpable: [Left:Yes] [Right:Yes] Electronic Signature(s) Signed: 02/23/2020 10:13:19 AM By: Army Melia Entered By: Army Melia on 02/23/2020 09:57:34 Stanley Marks  ([id number]) -------------------------------------------------------------------------------- Multi Wound Chart Details Patient Name: Stanley Marks Date of Service: 02/23/2020 9:30 AM Medical Record Number: [id number] Patient Account Number: [id number] Date of Birth/Sex: 1941/01/14 (79 y.o. M) Treating RN: Montey Hora Primary Care Shaterra Sanzone: Lamonte Sakai Other Clinician: Referring Londa Mackowski: Lamonte Sakai Treating Meghna Hagmann/Extender: Melburn Hake, HOYT Weeks in Treatment: 5 Vital Signs Height(in): 74 Pulse(bpm): Weight(lbs): 290 Blood Pressure(mmHg): Body Mass Index(BMI): 37 Temperature(F): 98.1 Respiratory Rate(breaths/min): 16 Photos: [N/A:N/A] Wound Location: Right Lower Leg - Anterior N/A N/A Wounding Event: Blister N/A N/A Primary Etiology: Venous Leg Ulcer N/A N/A Comorbid History: Cataracts, Chronic Obstructive N/A N/A Pulmonary Disease (COPD), Arrhythmia, Congestive Heart Failure, Hypertension, Osteomyelitis Date Acquired: 02/18/2020 N/A N/A  Weeks of Treatment: 0 N/A N/A Wound Status: Open N/A N/A Measurements L x W x D (cm) 8x15x0.1 N/A N/A Area (cm) : 94.248 N/A N/A Volume (cm) : 9.425 N/A N/A Classification: Full Thickness Without Exposed N/A N/A Support Structures Exudate Amount: Medium N/A N/A Exudate Type: Serosanguineous N/A N/A Exudate Color: red, brown N/A N/A Granulation Amount: Small (1-33%) N/A N/A Granulation Quality: Pink N/A N/A Necrotic Amount: Large (67-100%) N/A N/A Exposed Structures: Fat Layer (Subcutaneous Tissue) N/A N/A Exposed: Yes Fascia: No Tendon: No Muscle: No Joint: No Bone: No Epithelialization: None N/A N/A Treatment Notes Electronic Signature(s) Signed: 02/23/2020 2:58:07 PM By: Montey Hora Entered By: Montey Hora on 02/23/2020 10:07:05 Stanley Marks ([id number]) Stanley Marks ([id number]) -------------------------------------------------------------------------------- Multi-Disciplinary Care Plan Details Patient Name:  Stanley Marks Date of Service: 02/23/2020 9:30 AM Medical Record Number: [id number] Patient Account Number: [id number] Date of Birth/Sex: December 24, 1940 (79 y.o. M) Treating RN: Montey Hora Primary Care Angelissa Supan: Lamonte Sakai Other Clinician: Referring Jaheim Canino: Lamonte Sakai Treating Vonzella Althaus/Extender: Melburn Hake, HOYT Weeks in Treatment: 5 Active Inactive Abuse / Safety / Falls / Self Care Management Nursing Diagnoses: Potential for falls Goals: Patient will remain injury free related to falls Date Initiated: 01/19/2020 Target Resolution Date: 04/16/2020 Goal Status: Active Interventions: Assess fall risk on admission and as needed Notes: Orientation to the Wound Care Program Nursing Diagnoses: Knowledge deficit related to the wound healing center program Goals: Patient/caregiver will verbalize understanding of the [facility] Date Initiated: 01/19/2020 Target Resolution Date: 04/16/2020 Goal Status: Active Interventions: Provide education on orientation to the wound center Notes: Venous Leg Ulcer Nursing Diagnoses: Potential for venous Insuffiency (use before diagnosis confirmed) Goals: Patient will maintain optimal edema control Date Initiated: 01/19/2020 Target Resolution Date: 04/16/2020 Goal Status: Active Interventions: Provide education on venous insufficiency Notes: Wound/Skin Impairment Nursing Diagnoses: Impaired tissue integrity MANOJ, CONDE ([id number]) Goals: Ulcer/skin breakdown will heal within 14 weeks Date Initiated: 01/19/2020 Target Resolution Date: 04/16/2020 Goal Status: Active Interventions: Assess patient/caregiver ability to obtain necessary supplies Assess patient/caregiver ability to perform ulcer/skin care regimen upon admission and as needed Assess ulceration(s) every visit Notes: Electronic Signature(s) Signed: 02/23/2020 2:58:07 PM By: Montey Hora Entered By: Montey Hora on 02/23/2020 10:06:56 Stanley Marks  ([id number]) -------------------------------------------------------------------------------- Pain Assessment Details Patient Name: Stanley Marks Date of Service: 02/23/2020 9:30 AM Medical Record Number: [id number] Patient Account Number: [id number] Date of Birth/Sex: 25-Jul-1941 (78 y.o. M) Treating RN: Army Melia Primary Care Jsiah Menta: Lamonte Sakai Other Clinician: Referring Erland Vivas: Lamonte Sakai Treating Karianne Nogueira/Extender: Melburn Hake, HOYT Weeks in Treatment: 5 Active Problems Location of Pain Severity and Description of Pain Patient Has Paino No Site Locations Pain Management and Medication Current Pain Management: Electronic Signature(s) Signed: 02/23/2020 10:13:19 AM By: Army Melia Entered By: Army Melia on 02/23/2020 09:47:58 Stanley Marks ([id number]) -------------------------------------------------------------------------------- Patient/Caregiver Education Details Patient Name: Stanley Marks Date of Service: 02/23/2020 9:30 AM Medical Record Number: [id number] Patient Account Number: [id number] Date of Birth/Gender: 1941/05/18 (78 y.o. M) Treating RN: Montey Hora Primary Care Physician: Lamonte Sakai Other Clinician: Referring Physician: Lamonte Sakai Treating Physician/Extender: Sharalyn Ink in Treatment: 5 Education Assessment Education Provided To: Patient and Caregiver Education Topics Provided Venous: Handouts: Other: take fluid pills and elevate legs Methods: Explain/Verbal Responses: State content correctly Electronic Signature(s) Signed: 02/23/2020 2:58:07 PM By: Montey Hora Entered By: Montey Hora on 02/23/2020 10:15:12 Stanley Marks ([id number]) -------------------------------------------------------------------------------- Wound Assessment Details Patient Name: Stanley Marks Date of Service: 02/23/2020 9:30 AM Medical Record Number: [id number] Patient Account Number: [id number] Date of Birth/Sex:  05/10/1941 (79 y.o. M) Treating  RN: Army Melia Primary Care Saadiq Poche: Lamonte Sakai Other Clinician: Referring Yamato Kopf: Lamonte Sakai Treating Deshanna Kama/Extender: Melburn Hake, HOYT Weeks in Treatment: 5 Wound Status Wound Number: 9 Primary Venous Leg Ulcer Etiology: Wound Location: Right Lower Leg - Anterior Wound Open Wounding Event: Blister Status: Date Acquired: 02/18/2020 Comorbid Cataracts, Chronic Obstructive Pulmonary Disease (COPD), Weeks Of Treatment: 0 History: Arrhythmia, Congestive Heart Failure, Hypertension, Clustered Wound: No Osteomyelitis Photos Wound Measurements Length: (cm) 8 Width: (cm) 15 Depth: (cm) 0.1 Area: (cm) 94.248 Volume: (cm) 9.425 % Reduction in Area: % Reduction in Volume: Epithelialization: None Tunneling: No Undermining: No Wound Description Classification: Full Thickness Without Exposed Support Struct Exudate Amount: Medium Exudate Type: Serosanguineous Exudate Color: red, brown ures Foul Odor After Cleansing: No Slough/Fibrino Yes Wound Bed Granulation Amount: Small (1-33%) Exposed Structure Granulation Quality: Pink Fascia Exposed: No Necrotic Amount: Large (67-100%) Fat Layer (Subcutaneous Tissue) Exposed: Yes Necrotic Quality: Adherent Slough Tendon Exposed: No Muscle Exposed: No Joint Exposed: No Bone Exposed: No Treatment Notes Wound #9 (Right, Anterior Lower Leg) Notes xtrasorb, 3 layer bilateral with unna to anchor Electronic Signature(s) Signed: 02/23/2020 10:13:19 AM By: Evert Kohl ([id number]) Entered By: Army Melia on 02/23/2020 09:56:58 Stanley Marks ([id number]) -------------------------------------------------------------------------------- Vitals Details Patient Name: Stanley Marks Date of Service: 02/23/2020 9:30 AM Medical Record Number: [id number] Patient Account Number: [id number] Date of Birth/Sex: 08-01-1941 (78 y.o. M) Treating RN: Army Melia Primary Care Omar Gayden: Lamonte Sakai Other Clinician: Referring  Amad Mau: Lamonte Sakai Treating Addelynn Batte/Extender: Melburn Hake, HOYT Weeks in Treatment: 5 Vital Signs Time Taken: 09:46 Temperature (F): 98.1 Height (in): 74 Respiratory Rate (breaths/min): 16 Weight (lbs): 290 Reference Range: 80 - 120 mg / dl Body Mass Index (BMI): 37.2 Electronic Signature(s) Signed: 02/23/2020 10:13:19 AM By: Army Melia Entered By: Army Melia on 02/23/2020 09:47:54

## 2020-02-23 NOTE — Progress Notes (Addendum)
JAMESANDREW, KERNAGHAN ([id number]) Visit Report for 02/23/2020 Chief Complaint Document Details Patient Name: Stanley Marks, Stanley Marks Date of Service: 02/23/2020 9:30 AM Medical Record Number: [id number] Patient Account Number: [id number] Date of Birth/Sex: Jan 04, 1941 (78 y.o. M) Treating RN: Montey Hora Primary Care Provider: Lamonte Sakai Other Clinician: Referring Provider: Lamonte Sakai Treating Provider/Extender: Melburn Hake, Taiki Buckwalter Weeks in Treatment: 5 Information Obtained from: Patient Chief Complaint Left LE Ulcers Electronic Signature(s) Signed: 02/23/2020 10:03:50 AM By: Worthy Keeler PA-C Entered By: Worthy Keeler on 02/23/2020 10:03:49 Stanley Marks ([id number]) -------------------------------------------------------------------------------- HPI Details Patient Name: Stanley Marks Date of Service: 02/23/2020 9:30 AM Medical Record Number: [id number] Patient Account Number: [id number] Date of Birth/Sex: [birth year] (78 y.o. M) Treating RN: Montey Hora Primary Care Provider: Lamonte Sakai Other Clinician: Referring Provider: Lamonte Sakai Treating Provider/Extender: Melburn Hake, Peg Fifer Weeks in Treatment: 5 History of Present Illness HPI Description: Pleasant 79 year old with history of chronic venous insufficiency and hypertension. No History of diabetes or peripheral arterial disease. He says that he stopped taking his "fluid pill" so that he wouldn't have to urinate as often. He subsequently developed bilateral lower extremity swelling and left calf ulcerations in late August 2016. Status post bilateral lower extremity endovenous ablation over 5 years ago in [place]. He does not regularly wear compression stockings. Denies any history of DVT. He is ambulating per his baseline. No claudication or rest pain. Tolerated 3 layer compression bandage with Prisma with significant improvement. Recently declined compression bandages and has been using a Tubigrip for edema control. He returns to  clinic for follow-up and is without complaints. No significant pain. No fever or chills. No drainage. READMISSION 10/14/2019 This is a 79 year old man who is in this clinic for years ago with a venous insufficiency wound on the left leg. He was discharged with compression stockings but he does not wear them. He states he developed a blister on the right anterior tibial area in October. He was in the ER on 10/26 after falling and noted to have a wound on his right leg/shin although the patient states the blister was already present. He was felt possibly to be mildly dehydrated he declined IV fluid. X-ray did not show any osseous abnormalities however he was noted to have severe right knee osteoarthritis. The patient has known chronic venous insufficiency. He states he had ablations in his bilateral lower legs many years ago. This seems well verified from the history noted above. He is not a diabetic and does not have known PAD. Past medical history; no diabetes, atrial fibrillation on Xarelto, history of basal cell cancer, COPD continued smoker, hypertension, peripheral vascular disease. ABIs in our clinic were 1.36 on the right and 1.24 on the left 11/11; patient with a venous insufficiency wound on the right anterior lower leg. Admitted to clinic last week. He complains bitterly about the 3 layer compression we put on him stating it was uncomfortable he could not sleep. He has previously not tolerated stockings very well as well. His ABIs in this clinic were noncompressible however his dorsalis pedis and posterior tibial pulses are easily palpable. He has decent edema control. We used Hydrofera Blue which apparently stuck to the wound. There are good improvements in wound dimensions 11/18; the patient's venous insufficiency wound on the right anterior lower leg is almost all epithelialized. However he arrives in the clinic with 2 skin tears on his forearm from a fall at noon yesterday 11/04/2019 on  evaluation today patient appears to be doing well with regard to his leg  ulcer as well as his right forearm ulcers. He has been tolerating the dressing changes without complication. Fortunately there is no sign of active infection at this time. No fevers, chills, nausea, vomiting, or diarrhea. I am extremely pleased with how things seem to be progressing based on what I am seeing today. 12/2; the patient's wound on the right anterior lower leg is healed. He has severe chronic venous insufficiency and has had previous ablations on this side. Previous ablations were done in [place]. He has not consistently worn compression stockings. He has a Tubigrip stocking on his left leg. The areas that he injured on his right dorsal forearm have some surface scabbing however I think this is on its way to closure I do not think independently they need to have him continue in this clinic. Readmission: 01/19/2020 upon evaluation today patient presents for reevaluation after having been seen last here in the clinic on November 11, 2019. He was readmitted last year on October 14, 2019 here in the clinic concerning right lower extremity ulcers. He had ABIs performed at that time that appeared to be doing well. Overall though he was only able to tolerate a Curlex and Coban wrap anything stronger was too much for him. He is still smoking unfortunately and this is an ongoing issue. He really has no plans to quit. He also does not wear compression socks he tells me that he cannot afford them. With that being said obviously I am not sure that this is really the reason behind why he does not like the compression socks my gut tells me he may have difficulty getting them on as well. Fortunately there is no signs of active infection at this time he does have a small ulcer on the anterior which actually began surface of his leg Christmas Eve 2020 he has larger wounds on the posterior surface of his leg which are larger and actually  began 2 weeks ago. He does have a history of congestive heart failure as noted by documentation. He also has venous insufficiency which is chronic as well as lymphedema. He also has hypertension. 02/03/2020 patient presents today for follow-up though I have not seen him last since 01/19/2020. He ended up in the hospital secondary to having issues with Covid. He was apparently very sick and not moving around a lot during that time. With that being said upon inspection today his wound actually appears to be doing significantly better compared to the previous evaluation even which is excellent news. The previous evaluation was the initial here in the clinic and at that time the wounds appeared to be doing much more severely in my opinion. Right now he has a lot of new skin growth which is AUTHER, CHAPLA ([id number]) excellent news. With that being said the wounds are also measuring significantly smaller and my hope is that although the hospital stay obviously was not ideal that it is good to help these areas to heal much more effectively and quickly. We were using Curlex and Coban wraps for him Which is about all he can really tolerate. 02/09/2020 upon evaluation today patient actually appears to be doing much better with regard to his ulcers on his left lower extremity. In general I feel like he is making excellent progress and the wounds in general seem to also be doing well from the standpoint of size as well as no evidence of infection. 02/16/2020 upon evaluation today patient actually appears to be doing quite well with regard to his lower  extremity wounds. In fact he appears to be healed based on what I am seeing currently there is no signs of active infection at this time. No fevers, chills, nausea, vomiting, or diarrhea. 02/23/2020 upon evaluation today patient appears to be doing poorly in regard to his right lower extremity especially although his left lower extremity ulcers show signs of fairly  significant edema at this point though there are no open wounds. He appears to be healed here. There is no evidence of active infection which is also good news. With that being said his caregiver I think is also his granddaughter actually did mention today that the patient does not take his fluid pills on a regular basis. In fact she even tells me that sometimes he throws the medicine away instead of taking it. The patient then went on to explain that it makes him urinate a lot which obviously is very difficult as well. Electronic Signature(s) Signed: 02/23/2020 2:04:15 PM By: Worthy Keeler PA-C Entered By: Worthy Keeler on 02/23/2020 14:04:14 Stanley Marks ([id number]) -------------------------------------------------------------------------------- Physical Exam Details Patient Name: Stanley Marks Date of Service: 02/23/2020 9:30 AM Medical Record Number: [id number] Patient Account Number: [id number] Date of Birth/Sex: [birth year] (78 y.o. M) Treating RN: Montey Hora Primary Care Provider: Lamonte Sakai Other Clinician: Referring Provider: Lamonte Sakai Treating Provider/Extender: Melburn Hake, Lundon Verdejo Weeks in Treatment: 5 Constitutional Well-nourished and well-hydrated in no acute distress. Respiratory normal breathing without difficulty. Psychiatric this patient is able to make decisions and demonstrates good insight into disease process. Alert and Oriented x 3. pleasant and cooperative. Notes Upon inspection patient's left lower extremity is completely healed the right lower extremity fortunately is showing signs of several blistered areas that are now open fortunately the not too deep but we are can have to manage this. Electronic Signature(s) Signed: 02/23/2020 2:05:57 PM By: Worthy Keeler PA-C Entered By: Worthy Keeler on 02/23/2020 14:05:57 Stanley Marks ([id number]) -------------------------------------------------------------------------------- Physician Orders  Details Patient Name: Stanley Marks Date of Service: 02/23/2020 9:30 AM Medical Record Number: [id number] Patient Account Number: [id number] Date of Birth/Sex: [birth year] (78 y.o. M) Treating RN: Montey Hora Primary Care Provider: Lamonte Sakai Other Clinician: Referring Provider: Lamonte Sakai Treating Provider/Extender: Melburn Hake, Talaysha Freeberg Weeks in Treatment: 5 Verbal / Phone Orders: No Diagnosis Coding ICD-10 Coding Code Description I87.2 Venous insufficiency (chronic) (peripheral) I89.0 Lymphedema, not elsewhere classified L97.822 Non-pressure chronic ulcer of other part of left lower leg with fat layer exposed I10 Essential (primary) hypertension I50.42 Chronic combined systolic (congestive) and diastolic (congestive) heart failure Wound Cleansing o May shower with protection. - Do not get your wraps wet Secondary Dressing Wound #9 Right,Anterior Lower Leg o XtraSorb Dressing Change Frequency o Change dressing every week Follow-up Appointments o Return Appointment in 1 week. - Please bring your compression socks with you to your next appointment Edema Control o 3 Layer Compression System - Bilateral - anchor with unna o Elevate legs to the level of the heart and pump ankles as often as possible o Other: - Please bring your compression socks with you to your next appointment Electronic Signature(s) Signed: 02/23/2020 2:58:07 PM By: Montey Hora Signed: 02/23/2020 3:07:58 PM By: Worthy Keeler PA-C Entered By: Montey Hora on 02/23/2020 10:14:41 Stanley Marks ([id number]) -------------------------------------------------------------------------------- Problem List Details Patient Name: Stanley Marks Date of Service: 02/23/2020 9:30 AM Medical Record Number: [id number] Patient Account Number: [id number] Date of Birth/Sex: 1941-05-27 (78 y.o. M) Treating RN: Montey Hora Primary Care Provider: Lamonte Sakai Other Clinician: Referring Provider: Humphrey Rolls,  Neelam Treating Provider/Extender: STONE III, Judie Hollick Weeks in Treatment: 5 Active Problems ICD-10 Evaluated Encounter Code Description Active Date Today Diagnosis I87.2 Venous insufficiency (chronic) (peripheral) 01/19/2020 No Yes I89.0 Lymphedema, not elsewhere classified 01/19/2020 No Yes L97.822 Non-pressure chronic ulcer of other part of left lower leg with fat layer 01/19/2020 No Yes exposed I10 Essential (primary) hypertension 01/19/2020 No Yes I50.42 Chronic combined systolic (congestive) and diastolic (congestive) heart 01/19/2020 No Yes failure Inactive Problems Resolved Problems Electronic Signature(s) Signed: 02/23/2020 10:03:44 AM By: Worthy Keeler PA-C Entered By: Worthy Keeler on 02/23/2020 10:03:44 Stanley Marks ([id number]) -------------------------------------------------------------------------------- Progress Note Details Patient Name: Stanley Marks Date of Service: 02/23/2020 9:30 AM Medical Record Number: [id number] Patient Account Number: [id number] Date of Birth/Sex: [birth year] (78 y.o. M) Treating RN: Montey Hora Primary Care Provider: Lamonte Sakai Other Clinician: Referring Provider: Lamonte Sakai Treating Provider/Extender: Melburn Hake, Iviana Blasingame Weeks in Treatment: 5 Subjective Chief Complaint Information obtained from Patient Left LE Ulcers History of Present Illness (HPI) Pleasant 79 year old with history of chronic venous insufficiency and hypertension. No History of diabetes or peripheral arterial disease. He says that he stopped taking his "fluid pill" so that he wouldn't have to urinate as often. He subsequently developed bilateral lower extremity swelling and left calf ulcerations in late August 2016. Status post bilateral lower extremity endovenous ablation over 5 years ago in [place]. He does not regularly wear compression stockings. Denies any history of DVT. He is ambulating per his baseline. No claudication or rest pain. Tolerated 3 layer compression  bandage with Prisma with significant improvement. Recently declined compression bandages and has been using a Tubigrip for edema control. He returns to clinic for follow-up and is without complaints. No significant pain. No fever or chills. No drainage. READMISSION 10/14/2019 This is a 79 year old man who is in this clinic for years ago with a venous insufficiency wound on the left leg. He was discharged with compression stockings but he does not wear them. He states he developed a blister on the right anterior tibial area in October. He was in the ER on 10/26 after falling and noted to have a wound on his right leg/shin although the patient states the blister was already present. He was felt possibly to be mildly dehydrated he declined IV fluid. X-ray did not show any osseous abnormalities however he was noted to have severe right knee osteoarthritis. The patient has known chronic venous insufficiency. He states he had ablations in his bilateral lower legs many years ago. This seems well verified from the history noted above. He is not a diabetic and does not have known PAD. Past medical history; no diabetes, atrial fibrillation on Xarelto, history of basal cell cancer, COPD continued smoker, hypertension, peripheral vascular disease. ABIs in our clinic were 1.36 on the right and 1.24 on the left 11/11; patient with a venous insufficiency wound on the right anterior lower leg. Admitted to clinic last week. He complains bitterly about the 3 layer compression we put on him stating it was uncomfortable he could not sleep. He has previously not tolerated stockings very well as well. His ABIs in this clinic were noncompressible however his dorsalis pedis and posterior tibial pulses are easily palpable. He has decent edema control. We used Hydrofera Blue which apparently stuck to the wound. There are good improvements in wound dimensions 11/18; the patient's venous insufficiency wound on the right  anterior lower leg is almost all epithelialized. However he arrives in the clinic with 2 skin tears on his forearm from  a fall at noon yesterday 11/04/2019 on evaluation today patient appears to be doing well with regard to his leg ulcer as well as his right forearm ulcers. He has been tolerating the dressing changes without complication. Fortunately there is no sign of active infection at this time. No fevers, chills, nausea, vomiting, or diarrhea. I am extremely pleased with how things seem to be progressing based on what I am seeing today. 12/2; the patient's wound on the right anterior lower leg is healed. He has severe chronic venous insufficiency and has had previous ablations on this side. Previous ablations were done in [place]. He has not consistently worn compression stockings. He has a Tubigrip stocking on his left leg. The areas that he injured on his right dorsal forearm have some surface scabbing however I think this is on its way to closure I do not think independently they need to have him continue in this clinic. Readmission: 01/19/2020 upon evaluation today patient presents for reevaluation after having been seen last here in the clinic on November 11, 2019. He was readmitted last year on October 14, 2019 here in the clinic concerning right lower extremity ulcers. He had ABIs performed at that time that appeared to be doing well. Overall though he was only able to tolerate a Curlex and Coban wrap anything stronger was too much for him. He is still smoking unfortunately and this is an ongoing issue. He really has no plans to quit. He also does not wear compression socks he tells me that he cannot afford them. With that being said obviously I am not sure that this is really the reason behind why he does not like the compression socks my gut tells me he may have difficulty getting them on as well. Fortunately there is no signs of active infection at this time he does have a small ulcer on  the anterior which actually began surface of his leg Christmas Eve 2020 he has larger wounds on the posterior surface of his leg which are larger and actually began 2 weeks ago. He does have a history of congestive heart failure as noted by documentation. He also has venous insufficiency which is chronic as well as lymphedema. He also has hypertension. KENENNA, KOOIKER ([id number]) 02/03/2020 patient presents today for follow-up though I have not seen him last since 01/19/2020. He ended up in the hospital secondary to having issues with Covid. He was apparently very sick and not moving around a lot during that time. With that being said upon inspection today his wound actually appears to be doing significantly better compared to the previous evaluation even which is excellent news. The previous evaluation was the initial here in the clinic and at that time the wounds appeared to be doing much more severely in my opinion. Right now he has a lot of new skin growth which is excellent news. With that being said the wounds are also measuring significantly smaller and my hope is that although the hospital stay obviously was not ideal that it is good to help these areas to heal much more effectively and quickly. We were using Curlex and Coban wraps for him Which is about all he can really tolerate. 02/09/2020 upon evaluation today patient actually appears to be doing much better with regard to his ulcers on his left lower extremity. In general I feel like he is making excellent progress and the wounds in general seem to also be doing well from the standpoint of size as well as no  evidence of infection. 02/16/2020 upon evaluation today patient actually appears to be doing quite well with regard to his lower extremity wounds. In fact he appears to be healed based on what I am seeing currently there is no signs of active infection at this time. No fevers, chills, nausea, vomiting, or diarrhea. 02/23/2020 upon evaluation  today patient appears to be doing poorly in regard to his right lower extremity especially although his left lower extremity ulcers show signs of fairly significant edema at this point though there are no open wounds. He appears to be healed here. There is no evidence of active infection which is also good news. With that being said his caregiver I think is also his granddaughter actually did mention today that the patient does not take his fluid pills on a regular basis. In fact she even tells me that sometimes he throws the medicine away instead of taking it. The patient then went on to explain that it makes him urinate a lot which obviously is very difficult as well. Objective Constitutional Well-nourished and well-hydrated in no acute distress. Vitals Time Taken: 9:46 AM, Height: 74 in, Weight: 290 lbs, BMI: 37.2, Temperature: 98.1 F, Respiratory Rate: 16 breaths/min. Respiratory normal breathing without difficulty. Psychiatric this patient is able to make decisions and demonstrates good insight into disease process. Alert and Oriented x 3. pleasant and cooperative. General Notes: Upon inspection patient's left lower extremity is completely healed the right lower extremity fortunately is showing signs of several blistered areas that are now open fortunately the not too deep but we are can have to manage this. Integumentary (Hair, Skin) Wound #9 status is Open. Original cause of wound was Blister. The wound is located on the Right,Anterior Lower Leg. The wound measures 8cm length x 15cm width x 0.1cm depth; 94.248cm^2 area and 9.425cm^3 volume. There is Fat Layer (Subcutaneous Tissue) Exposed exposed. There is no tunneling or undermining noted. There is a medium amount of serosanguineous drainage noted. There is small (1-33%) pink granulation within the wound bed. There is a large (67-100%) amount of necrotic tissue within the wound bed including Adherent Slough. Assessment Active  Problems ICD-10 Venous insufficiency (chronic) (peripheral) Lymphedema, not elsewhere classified Non-pressure chronic ulcer of other part of left lower leg with fat layer exposed Essential (primary) hypertension Chronic combined systolic (congestive) and diastolic (congestive) heart failure WAYNE, SIEMON ([id number]) Procedures Wound #9 Pre-procedure diagnosis of Wound #9 is a Venous Leg Ulcer located on the Right,Anterior Lower Leg . There was a Three Layer Compression Therapy Procedure with a pre-treatment ABI of 1.2 by Montey Hora, RN. Post procedure Diagnosis Wound #9: Same as Pre-Procedure Plan Wound Cleansing: May shower with protection. - Do not get your wraps wet Secondary Dressing: Wound #9 Right,Anterior Lower Leg: XtraSorb Dressing Change Frequency: Change dressing every week Follow-up Appointments: Return Appointment in 1 week. - Please bring your compression socks with you to your next appointment Edema Control: 3 Layer Compression System - Bilateral - anchor with unna Elevate legs to the level of the heart and pump ankles as often as possible Other: - Please bring your compression socks with you to your next appointment 1. I would recommend currently that we continue to wrap both lower extremities. At this time I think that this is the best option as the patient is having significant edema still. 2. I am also can recommend at this time we use Xtrasorb over the right lower extremity as I think this is probably best any alginate to just  go to get really macerated. 3. I am also can recommend that we continue with specifically 3 layer compression wraps to both legs. 4. I did discuss with the patient that I do believe that he needs to be taking his medication on a regular basis. He does not need to skip this and he also does not need to throw the pills away. I understand that he makes him urinate more than he likes but nonetheless this is still of utmost importance as  far as trying to keep things under control here. We will see patient back for reevaluation in 1 week here in the clinic. If anything worsens or changes patient will contact our office for additional recommendations. Electronic Signature(s) Signed: 02/23/2020 2:13:45 PM By: Worthy Keeler PA-C Previous Signature: 02/23/2020 2:06:49 PM Version By: Worthy Keeler PA-C Entered By: Worthy Keeler on 02/23/2020 14:13:45 Stanley Marks ([id number]) -------------------------------------------------------------------------------- SuperBill Details Patient Name: Stanley Marks Date of Service: 02/23/2020 Medical Record Number: [id number] Patient Account Number: [id number] Date of Birth/Sex: [birth year] (78 y.o. 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

## 2020-03-01 ENCOUNTER — Other Ambulatory Visit: Payer: Self-pay

## 2020-03-01 ENCOUNTER — Encounter: Payer: Medicare Other | Admitting: Physician Assistant

## 2020-03-01 DIAGNOSIS — I872 Venous insufficiency (chronic) (peripheral): Secondary | ICD-10-CM | POA: Diagnosis not present

## 2020-03-01 NOTE — Progress Notes (Signed)
COLLINS, VANDENBOS ([id number]) Visit Report for 03/01/2020 Arrival Information Details Patient Name: MARKUS, ARMINIO Date of Service: 03/01/2020 8:30 AM Medical Record Number: [id number] Patient Account Number: [id number] Date of Birth/Sex: 04-16-1941 (78 y.o. M) Treating RN: Montey Hora Primary Care Malvika Tung: Lamonte Sakai Other Clinician: Referring Chelcee Korpi: Lamonte Sakai Treating Necie Wilcoxson/Extender: Melburn Hake, HOYT Weeks in Treatment: 6 Visit Information History Since Last Visit Added or deleted any medications: No Patient Arrived: Walker Any new allergies or adverse reactions: No Arrival Time: 08:30 Had a fall or experienced change in No Accompanied By: granddaughter activities of daily living that may affect Transfer Assistance: None risk of falls: Patient Identification Verified: Yes Signs or symptoms of abuse/neglect since last visito No Secondary Verification Process Completed: Yes Hospitalized since last visit: No Implantable device outside of the clinic excluding No cellular tissue based products placed in the center since last visit: Has Dressing in Place as Prescribed: Yes Pain Present Now: No Electronic Signature(s) Signed: 03/01/2020 3:58:05 PM By: Lorine Bears RCP, RRT, CHT Entered By: Lorine Bears on 03/01/2020 08:34:57 [name] ([id number]) -------------------------------------------------------------------------------- Compression Therapy Details Patient Name: [name] Date of Service: 03/01/2020 8:30 AM Medical Record Number: [id number] Patient Account Number: [id number] Date of Birth/Sex: Sep 16, 1941 (78 y.o. M) Treating RN: Montey Hora Primary Care Ronnette Rump: Lamonte Sakai Other Clinician: Referring Lezli Danek: Lamonte Sakai Treating Krikor Willet/Extender: Melburn Hake, HOYT Weeks in Treatment: 6 Compression Therapy Performed for Wound Assessment: Wound #9 Right,Anterior Lower Leg Performed By: Clinician Montey Hora,  RN Compression Type: Three Layer Pre Treatment ABI: 1.2 Post Procedure Diagnosis Same as Pre-procedure Electronic Signature(s) Signed: 03/01/2020 4:41:39 PM By: Montey Hora Entered By: Montey Hora on 03/01/2020 08:55:35 [name] ([id number]) -------------------------------------------------------------------------------- Encounter Discharge Information Details Patient Name: [name] Date of Service: 03/01/2020 8:30 AM Medical Record Number: [id number] Patient Account Number: [id number] Date of Birth/Sex: [birth year] (78 y.o. M) Treating RN: Montey Hora Primary Care Davonn Flanery: Lamonte Sakai Other Clinician: Referring Ayumi Wangerin: Lamonte Sakai Treating Dayan Desa/Extender: Melburn Hake, HOYT Weeks in Treatment: 6 Encounter Discharge Information Items Discharge Condition: Stable Ambulatory Status: Walker Discharge Destination: Home Transportation: Private Auto Accompanied By: granddaughter Schedule Follow-up Appointment: Yes Clinical Summary of Care: Electronic Signature(s) Signed: 03/01/2020 4:41:39 PM By: Montey Hora Entered By: Montey Hora on 03/01/2020 08:57:21 [name] ([id number]) -------------------------------------------------------------------------------- Lower Extremity Assessment Details Patient Name: [name] Date of Service: 03/01/2020 8:30 AM Medical Record Number: [id number] Patient Account Number: [id number] Date of Birth/Sex: November 07, 1941 (78 y.o. M) Treating RN: Army Melia Primary Care Tikia Skilton: Lamonte Sakai Other Clinician: Referring Joy Haegele: Lamonte Sakai Treating Jaeleen Inzunza/Extender: Melburn Hake, HOYT Weeks in Treatment: 6 Edema Assessment Assessed: [Left: No] [Right: No] Edema: [Left: No] [Right: No] Calf Left: Right: Point of Measurement: 35 cm From Medial Instep 40 cm 40 cm Ankle Left: Right: Point of Measurement: 13 cm From Medial Instep 24 cm 24 cm Vascular Assessment Pulses: Dorsalis Pedis Palpable: [Left:Yes]  [Right:Yes] Electronic Signature(s) Signed: 03/01/2020 4:13:28 PM By: Army Melia Entered By: Army Melia on 03/01/2020 08:43:58 [name] ([id number]) -------------------------------------------------------------------------------- Multi Wound Chart Details Patient Name: [name] Date of Service: 03/01/2020 8:30 AM Medical Record Number: [id number] Patient Account Number: [id number] Date of Birth/Sex: 09-21-1941 (79 y.o. M) Treating RN: Montey Hora Primary Care Shantal Roan: Lamonte Sakai Other Clinician: Referring Torianna Junio: Lamonte Sakai Treating Earlena Werst/Extender: Melburn Hake, HOYT Weeks in Treatment: 6 Vital Signs Height(in): 74 Pulse(bpm): 71 Weight(lbs): 290 Blood Pressure(mmHg): 134/84 Body Mass Index(BMI): 37 Temperature(F): 98.0 Respiratory Rate(breaths/min): 20 Photos: [N/A:N/A] Wound Location: Right Lower Leg - Anterior N/A N/A Wounding Event:  Blister N/A N/A Primary Etiology: Venous Leg Ulcer N/A N/A Comorbid History: Cataracts, Chronic Obstructive N/A N/A Pulmonary Disease (COPD), Arrhythmia, Congestive Heart Failure, Hypertension, Osteomyelitis Date Acquired: 02/18/2020 N/A N/A Weeks of Treatment: 1 N/A N/A Wound Status: Open N/A N/A Measurements L x W x D (cm) 8x9x0.1 N/A N/A Area (cm) : 56.549 N/A N/A Volume (cm) : 5.655 N/A N/A % Reduction in Area: 40.00% N/A N/A % Reduction in Volume: 40.00% N/A N/A Classification: Full Thickness Without Exposed N/A N/A Support Structures Exudate Amount: Medium N/A N/A Exudate Type: Serosanguineous N/A N/A Exudate Color: red, brown N/A N/A Granulation Amount: Small (1-33%) N/A N/A Granulation Quality: Pink N/A N/A Necrotic Amount: Large (67-100%) N/A N/A Exposed Structures: Fat Layer (Subcutaneous Tissue) N/A N/A Exposed: Yes Fascia: No Tendon: No Muscle: No Joint: No Bone: No Epithelialization: None N/A N/A Treatment Notes Electronic Signature(s) Signed: 03/01/2020 4:41:39 PM By: Josetta Huddle ([id number]) Entered By: Montey Hora on 03/01/2020 08:54:06 [name] ([id number]) -------------------------------------------------------------------------------- Multi-Disciplinary Care Plan Details Patient Name: [name] Date of Service: 03/01/2020 8:30 AM Medical Record Number: [id number] Patient Account Number: [id number] Date of Birth/Sex: Sep 01, 1941 (78 y.o. M) Treating RN: Montey Hora Primary Care Avabella Wailes: Lamonte Sakai Other Clinician: Referring Aizley Stenseth: Lamonte Sakai Treating Haskell Rihn/Extender: Melburn Hake, HOYT Weeks in Treatment: 6 Active Inactive Abuse / Safety / Falls / Self Care Management Nursing Diagnoses: Potential for falls Goals: Patient will remain injury free related to falls Date Initiated: 01/19/2020 Target Resolution Date: 04/16/2020 Goal Status: Active Interventions: Assess fall risk on admission and as needed Notes: Orientation to the Wound Care Program Nursing Diagnoses: Knowledge deficit related to the wound healing center program Goals: Patient/caregiver will verbalize understanding of the [place] Program Date Initiated: 01/19/2020 Target Resolution Date: 04/16/2020 Goal Status: Active Interventions: Provide education on orientation to the wound center Notes: Venous Leg Ulcer Nursing Diagnoses: Potential for venous Insuffiency (use before diagnosis confirmed) Goals: Patient will maintain optimal edema control Date Initiated: 01/19/2020 Target Resolution Date: 04/16/2020 Goal Status: Active Interventions: Provide education on venous insufficiency Notes: Wound/Skin Impairment Nursing Diagnoses: Impaired tissue integrity ARKIE, HAGGE ([id number]) Goals: Ulcer/skin breakdown will heal within 14 weeks Date Initiated: 01/19/2020 Target Resolution Date: 04/16/2020 Goal Status: Active Interventions: Assess patient/caregiver ability to obtain necessary supplies Assess patient/caregiver ability to perform ulcer/skin care  regimen upon admission and as needed Assess ulceration(s) every visit Notes: Electronic Signature(s) Signed: 03/01/2020 4:41:39 PM By: Montey Hora Entered By: Montey Hora on 03/01/2020 08:53:59 [name] ([id number]) -------------------------------------------------------------------------------- Pain Assessment Details Patient Name: [name] Date of Service: 03/01/2020 8:30 AM Medical Record Number: [id number] Patient Account Number: [id number] Date of Birth/Sex: 1941/12/01 (79 y.o. M) Treating RN: Army Melia Primary Care Osric Klopf: Lamonte Sakai Other Clinician: Referring Basir Niven: Lamonte Sakai Treating Aracelli Woloszyn/Extender: Melburn Hake, HOYT Weeks in Treatment: 6 Active Problems Location of Pain Severity and Description of Pain Patient Has Paino No Site Locations Pain Management and Medication Current Pain Management: Electronic Signature(s) Signed: 03/01/2020 4:13:28 PM By: Army Melia Entered By: Army Melia on 03/01/2020 08:42:43 [name] ([id number]) -------------------------------------------------------------------------------- Patient/Caregiver Education Details Patient Name: [name] Date of Service: 03/01/2020 8:30 AM Medical Record Number: [id number] Patient Account Number: [id number] Date of Birth/Gender: 1941/07/19 (78 y.o. M) Treating RN: Montey Hora Primary Care Physician: Lamonte Sakai Other Clinician: Referring Physician: Lamonte Sakai Treating Physician/Extender: Sharalyn Ink in Treatment: 6 Education Assessment Education Provided To: Patient and Caregiver Education Topics Provided Venous: Handouts: Other: need for ongoing compression and fluid management Methods: Explain/Verbal Responses: State  content correctly Electronic Signature(s) Signed: 03/01/2020 4:41:39 PM By: Montey Hora Entered By: Montey Hora on 03/01/2020 08:56:10 [name]  ([id number]) -------------------------------------------------------------------------------- Wound Assessment Details Patient Name: [name] Date of Service: 03/01/2020 8:30 AM Medical Record Number: [id number] Patient Account Number: [id number] Date of Birth/Sex: 05/17/1941 (78 y.o. M) Treating RN: Army Melia Primary Care Triva Hueber: Lamonte Sakai Other Clinician: Referring Kendahl Bumgardner: Lamonte Sakai Treating Raymondo Garcialopez/Extender: Melburn Hake, HOYT Weeks in Treatment: 6 Wound Status Wound Number: 9 Primary Venous Leg Ulcer Etiology: Wound Location: Right Lower Leg - Anterior Wound Open Wounding Event: Blister Status: Date Acquired: 02/18/2020 Comorbid Cataracts, Chronic Obstructive Pulmonary Disease (COPD), Weeks Of Treatment: 1 History: Arrhythmia, Congestive Heart Failure, Hypertension, Clustered Wound: No Osteomyelitis Photos Wound Measurements Length: (cm) 8 Width: (cm) 9 Depth: (cm) 0.1 Area: (cm) 56.549 Volume: (cm) 5.655 % Reduction in Area: 40% % Reduction in Volume: 40% Epithelialization: None Wound Description Classification: Full Thickness Without Exposed Support Struct Exudate Amount: Medium Exudate Type: Serosanguineous Exudate Color: red, brown ures Foul Odor After Cleansing: No Slough/Fibrino Yes Wound Bed Granulation Amount: Small (1-33%) Exposed Structure Granulation Quality: Pink Fascia Exposed: No Necrotic Amount: Large (67-100%) Fat Layer (Subcutaneous Tissue) Exposed: Yes Necrotic Quality: Adherent Slough Tendon Exposed: No Muscle Exposed: No Joint Exposed: No Bone Exposed: No Treatment Notes Wound #9 (Right, Anterior Lower Leg) Notes xtrasorb, 3 layer with unna to anchor Electronic Signature(s) Signed: 03/01/2020 4:13:28 PM By: Evert Kohl ([id number]) Entered By: Army Melia on 03/01/2020 08:47:03 [name] ([id number]) -------------------------------------------------------------------------------- Vitals  Details Patient Name: [name] Date of Service: 03/01/2020 8:30 AM Medical Record Number: [id number] Patient Account Number: [id number] Date of Birth/Sex: 02-Mar-1941 (78 y.o. M) Treating RN: Montey Hora Primary Care Akita Maxim: Lamonte Sakai Other Clinician: Referring Gus Littler: Lamonte Sakai Treating Ninel Abdella/Extender: Melburn Hake, HOYT Weeks in Treatment: 6 Vital Signs Time Taken: 08:30 Temperature (F): 98.0 Height (in): 74 Pulse (bpm): 71 Weight (lbs): 290 Respiratory Rate (breaths/min): 20 Body Mass Index (BMI): 37.2 Blood Pressure (mmHg): 134/84 Reference Range: 80 - 120 mg / dl Airway Pulse Oximetry (%): 96 Electronic Signature(s) Signed: 03/01/2020 3:58:05 PM By: Lorine Bears RCP, RRT, CHT Entered By: Lorine Bears on 03/01/2020 08:35:56

## 2020-03-01 NOTE — Progress Notes (Addendum)
Stanley Marks, Stanley Marks ([id number]) Visit Report for 03/01/2020 Chief Complaint Document Details Patient Name: Stanley Marks, Stanley Marks Date of Service: 03/01/2020 8:30 AM Medical Record Number: [id number] Patient Account Number: [id number] Date of Birth/Sex: 23-Apr-1941 (79 y.o. M) Treating RN: Montey Hora Primary Care Provider: Lamonte Sakai Other Clinician: Referring Provider: Lamonte Sakai Treating Provider/Extender: Melburn Hake, Karin Griffith Weeks in Treatment: 6 Information Obtained from: Patient Chief Complaint Left LE Ulcers Electronic Signature(s) Signed: 03/01/2020 8:44:56 AM By: Worthy Keeler PA-C Entered By: Worthy Keeler on 03/01/2020 08:44:56 Stanley Marks ([id number]) -------------------------------------------------------------------------------- HPI Details Patient Name: Stanley Marks Date of Service: 03/01/2020 8:30 AM Medical Record Number: [id number] Patient Account Number: [id number] Date of Birth/Sex: 10-Dec-1941 (79 y.o. M) Treating RN: Montey Hora Primary Care Provider: Lamonte Sakai Other Clinician: Referring Provider: Lamonte Sakai Treating Provider/Extender: Melburn Hake, Jenipher Havel Weeks in Treatment: 6 History of Present Illness HPI Description: Pleasant 79 year old with history of chronic venous insufficiency and hypertension. No History of diabetes or peripheral arterial disease. He says that he stopped taking his "fluid pill" so that he wouldn't have to urinate as often. He subsequently developed bilateral lower extremity swelling and left calf ulcerations in late August 2016. Status post bilateral lower extremity endovenous ablation over 5 years ago in [place]. He does not regularly wear compression stockings. Denies any history of DVT. He is ambulating per his baseline. No claudication or rest pain. Tolerated 3 layer compression bandage with Prisma with significant improvement. Recently declined compression bandages and has been using a Tubigrip for edema control. He returns to  clinic for follow-up and is without complaints. No significant pain. No fever or chills. No drainage. READMISSION 10/14/2019 This is a 79 year old man who is in this clinic for years ago with a venous insufficiency wound on the left leg. He was discharged with compression stockings but he does not wear them. He states he developed a blister on the right anterior tibial area in October. He was in the ER on 10/26 after falling and noted to have a wound on his right leg/shin although the patient states the blister was already present. He was felt possibly to be mildly dehydrated he declined IV fluid. X-ray did not show any osseous abnormalities however he was noted to have severe right knee osteoarthritis. The patient has known chronic venous insufficiency. He states he had ablations in his bilateral lower legs many years ago. This seems well verified from the history noted above. He is not a diabetic and does not have known PAD. Past medical history; no diabetes, atrial fibrillation on Xarelto, history of basal cell cancer, COPD continued smoker, hypertension, peripheral vascular disease. ABIs in our clinic were 1.36 on the right and 1.24 on the left 11/11; patient with a venous insufficiency wound on the right anterior lower leg. Admitted to clinic last week. He complains bitterly about the 3 layer compression we put on him stating it was uncomfortable he could not sleep. He has previously not tolerated stockings very well as well. His ABIs in this clinic were noncompressible however his dorsalis pedis and posterior tibial pulses are easily palpable. He has decent edema control. We used Hydrofera Blue which apparently stuck to the wound. There are good improvements in wound dimensions 11/18; the patient's venous insufficiency wound on the right anterior lower leg is almost all epithelialized. However he arrives in the clinic with 2 skin tears on his forearm from a fall at noon yesterday 11/04/2019 on  evaluation today patient appears to be doing well with regard to his leg  ulcer as well as his right forearm ulcers. He has been tolerating the dressing changes without complication. Fortunately there is no sign of active infection at this time. No fevers, chills, nausea, vomiting, or diarrhea. I am extremely pleased with how things seem to be progressing based on what I am seeing today. 12/2; the patient's wound on the right anterior lower leg is healed. He has severe chronic venous insufficiency and has had previous ablations on this side. Previous ablations were done in [place]. He has not consistently worn compression stockings. He has a Tubigrip stocking on his left leg. The areas that he injured on his right dorsal forearm have some surface scabbing however I think this is on its way to closure I do not think independently they need to have him continue in this clinic. Readmission: 01/19/2020 upon evaluation today patient presents for reevaluation after having been seen last here in the clinic on November 11, 2019. He was readmitted last year on October 14, 2019 here in the clinic concerning right lower extremity ulcers. He had ABIs performed at that time that appeared to be doing well. Overall though he was only able to tolerate a Curlex and Coban wrap anything stronger was too much for him. He is still smoking unfortunately and this is an ongoing issue. He really has no plans to quit. He also does not wear compression socks he tells me that he cannot afford them. With that being said obviously I am not sure that this is really the reason behind why he does not like the compression socks my gut tells me he may have difficulty getting them on as well. Fortunately there is no signs of active infection at this time he does have a small ulcer on the anterior which actually began surface of his leg Christmas Eve 2020 he has larger wounds on the posterior surface of his leg which are larger and actually  began 2 weeks ago. He does have a history of congestive heart failure as noted by documentation. He also has venous insufficiency which is chronic as well as lymphedema. He also has hypertension. 02/03/2020 patient presents today for follow-up though I have not seen him last since 01/19/2020. He ended up in the hospital secondary to having issues with Covid. He was apparently very sick and not moving around a lot during that time. With that being said upon inspection today his wound actually appears to be doing significantly better compared to the previous evaluation even which is excellent news. The previous evaluation was the initial here in the clinic and at that time the wounds appeared to be doing much more severely in my opinion. Right now he has a lot of new skin growth which is DEBRA, WARMOTH ([id number]) excellent news. With that being said the wounds are also measuring significantly smaller and my hope is that although the hospital stay obviously was not ideal that it is good to help these areas to heal much more effectively and quickly. We were using Curlex and Coban wraps for him Which is about all he can really tolerate. 02/09/2020 upon evaluation today patient actually appears to be doing much better with regard to his ulcers on his left lower extremity. In general I feel like he is making excellent progress and the wounds in general seem to also be doing well from the standpoint of size as well as no evidence of infection. 02/16/2020 upon evaluation today patient actually appears to be doing quite well with regard to his lower  extremity wounds. In fact he appears to be healed based on what I am seeing currently there is no signs of active infection at this time. No fevers, chills, nausea, vomiting, or diarrhea. 02/23/2020 upon evaluation today patient appears to be doing poorly in regard to his right lower extremity especially although his left lower extremity ulcers show signs of fairly  significant edema at this point though there are no open wounds. He appears to be healed here. There is no evidence of active infection which is also good news. With that being said his caregiver I think is also his granddaughter actually did mention today that the patient does not take his fluid pills on a regular basis. In fact she even tells me that sometimes he throws the medicine away instead of taking it. The patient then went on to explain that it makes him urinate a lot which obviously is very difficult as well. 03/01/2020 upon evaluation today patient appears to be doing well with regard to his lower extremities bilaterally. He has been tolerating the dressing changes without complication. Fortunately there is no signs of active infection at this time. No fevers, chills, nausea, vomiting, or diarrhea. The patient has been taking his fluid pills, torsemide, and overall seems to be doing excellent in this regard. Electronic Signature(s) Signed: 03/01/2020 9:12:58 AM By: Worthy Keeler PA-C Previous Signature: 03/01/2020 9:12:31 AM Version By: Worthy Keeler PA-C Entered By: Worthy Keeler on 03/01/2020 09:12:57 Stanley Marks ([id number]) -------------------------------------------------------------------------------- Physical Exam Details Patient Name: Stanley Marks Date of Service: 03/01/2020 8:30 AM Medical Record Number: [id number] Patient Account Number: [id number] Date of Birth/Sex: [birth year] (79 y.o. M) Treating RN: Montey Hora Primary Care Provider: Lamonte Sakai Other Clinician: Referring Provider: Lamonte Sakai Treating Provider/Extender: Melburn Hake, Lamaria Hildebrandt Weeks in Treatment: 6 Constitutional Obese and well-hydrated in no acute distress. Respiratory normal breathing without difficulty. Psychiatric this patient is able to make decisions and demonstrates good insight into disease process. Alert and Oriented x 3. pleasant and cooperative. Notes Patient's wounds currently on  the right lower extremity appear to be healing and are very close to resolution overall very pleased in this regard. In regard to the left lower extremity appears to be completely healed he does have compression socks with him today. Electronic Signature(s) Signed: 03/01/2020 9:13:15 AM By: Worthy Keeler PA-C Entered By: Worthy Keeler on 03/01/2020 09:13:15 Stanley Marks ([id number]) -------------------------------------------------------------------------------- Physician Orders Details Patient Name: Stanley Marks Date of Service: 03/01/2020 8:30 AM Medical Record Number: [id number] Patient Account Number: [id number] Date of Birth/Sex: [birth year] (79 y.o. M) Treating RN: Montey Hora Primary Care Provider: Lamonte Sakai Other Clinician: Referring Provider: Lamonte Sakai Treating Provider/Extender: Melburn Hake, Jordis Repetto Weeks in Treatment: 6 Verbal / Phone Orders: No Diagnosis Coding ICD-10 Coding Code Description I87.2 Venous insufficiency (chronic) (peripheral) I89.0 Lymphedema, not elsewhere classified L97.822 Non-pressure chronic ulcer of other part of left lower leg with fat layer exposed I10 Essential (primary) hypertension I50.42 Chronic combined systolic (congestive) and diastolic (congestive) heart failure Wound Cleansing o May shower with protection. - Do not get your wraps wet Secondary Dressing Wound #9 Right,Anterior Lower Leg o XtraSorb Dressing Change Frequency o Change dressing every week Follow-up Appointments o Return Appointment in 1 week. - Please bring your compression socks with you to your next appointment Edema Control o 3 Layer Compression System - Right Lower Extremity - anchor with unna o Patient to wear own compression stockings - left leg o Elevate legs to the level of the heart and  pump ankles as often as possible o Other: - Please bring your compression socks with you to your next appointment Electronic Signature(s) Signed: 03/01/2020  4:41:39 PM By: Montey Hora Signed: 03/02/2020 5:56:47 PM By: Worthy Keeler PA-C Entered By: Montey Hora on 03/01/2020 09:06:16 Stanley Marks ([id number]) -------------------------------------------------------------------------------- Problem List Details Patient Name: Stanley Marks Date of Service: 03/01/2020 8:30 AM Medical Record Number: [id number] Patient Account Number: [id number] Date of Birth/Sex: 29-May-1941 (79 y.o. M) Treating RN: Montey Hora Primary Care Provider: Lamonte Sakai Other Clinician: Referring Provider: Lamonte Sakai Treating Provider/Extender: Melburn Hake, Chanice Brenton Weeks in Treatment: 6 Active Problems ICD-10 Evaluated Encounter Code Description Active Date Today Diagnosis I87.2 Venous insufficiency (chronic) (peripheral) 01/19/2020 No Yes I89.0 Lymphedema, not elsewhere classified 01/19/2020 No Yes L97.822 Non-pressure chronic ulcer of other part of left lower leg with fat layer 01/19/2020 No Yes exposed [place] (primary) hypertension 01/19/2020 No Yes I50.42 Chronic combined systolic (congestive) and diastolic (congestive) heart 01/19/2020 No Yes failure Inactive Problems Resolved Problems Electronic Signature(s) Signed: 03/01/2020 8:44:50 AM By: Worthy Keeler PA-C Entered By: Worthy Keeler on 03/01/2020 08:44:50 Stanley Marks ([id number]) -------------------------------------------------------------------------------- Progress Note Details Patient Name: Stanley Marks Date of Service: 03/01/2020 8:30 AM Medical Record Number: [id number] Patient Account Number: [id number] Date of Birth/Sex: 10/04/1941 (79 y.o. M) Treating RN: Montey Hora Primary Care Provider: Lamonte Sakai Other Clinician: Referring Provider: Lamonte Sakai Treating Provider/Extender: Melburn Hake, January Bergthold Weeks in Treatment: 6 Subjective Chief Complaint Information obtained from Patient Left LE Ulcers History of Present Illness (HPI) Pleasant 79 year old with history of chronic venous  insufficiency and hypertension. No History of diabetes or peripheral arterial disease. He says that he stopped taking his "fluid pill" so that he wouldn't have to urinate as often. He subsequently developed bilateral lower extremity swelling and left calf ulcerations in late August 2016. Status post bilateral lower extremity endovenous ablation over 5 years ago in [place]. He does not regularly wear compression stockings. Denies any history of DVT. He is ambulating per his baseline. No claudication or rest pain. Tolerated 3 layer compression bandage with Prisma with significant improvement. Recently declined compression bandages and has been using a Tubigrip for edema control. He returns to clinic for follow-up and is without complaints. No significant pain. No fever or chills. No drainage. READMISSION 10/14/2019 This is a 79 year old man who is in this clinic for years ago with a venous insufficiency wound on the left leg. He was discharged with compression stockings but he does not wear them. He states he developed a blister on the right anterior tibial area in October. He was in the ER on 10/26 after falling and noted to have a wound on his right leg/shin although the patient states the blister was already present. He was felt possibly to be mildly dehydrated he declined IV fluid. X-ray did not show any osseous abnormalities however he was noted to have severe right knee osteoarthritis. The patient has known chronic venous insufficiency. He states he had ablations in his bilateral lower legs many years ago. This seems well verified from the history noted above. He is not a diabetic and does not have known PAD. Past medical history; no diabetes, atrial fibrillation on Xarelto, history of basal cell cancer, COPD continued smoker, hypertension, peripheral vascular disease. ABIs in our clinic were 1.36 on the right and 1.24 on the left 11/11; patient with a venous insufficiency wound on the right  anterior lower leg. Admitted to clinic last week. He complains bitterly about the 3 layer compression  we put on him stating it was uncomfortable he could not sleep. He has previously not tolerated stockings very well as well. His ABIs in this clinic were noncompressible however his dorsalis pedis and posterior tibial pulses are easily palpable. He has decent edema control. We used Hydrofera Blue which apparently stuck to the wound. There are good improvements in wound dimensions 11/18; the patient's venous insufficiency wound on the right anterior lower leg is almost all epithelialized. However he arrives in the clinic with 2 skin tears on his forearm from a fall at noon yesterday 11/04/2019 on evaluation today patient appears to be doing well with regard to his leg ulcer as well as his right forearm ulcers. He has been tolerating the dressing changes without complication. Fortunately there is no sign of active infection at this time. No fevers, chills, nausea, vomiting, or diarrhea. I am extremely pleased with how things seem to be progressing based on what I am seeing today. 12/2; the patient's wound on the right anterior lower leg is healed. He has severe chronic venous insufficiency and has had previous ablations on this side. Previous ablations were done in [place]. He has not consistently worn compression stockings. He has a Tubigrip stocking on his left leg. The areas that he injured on his right dorsal forearm have some surface scabbing however I think this is on its way to closure I do not think independently they need to have him continue in this clinic. Readmission: 01/19/2020 upon evaluation today patient presents for reevaluation after having been seen last here in the clinic on November 11, 2019. He was readmitted last year on October 14, 2019 here in the clinic concerning right lower extremity ulcers. He had ABIs performed at that time that appeared to be doing well. Overall though he  was only able to tolerate a Curlex and Coban wrap anything stronger was too much for him. He is still smoking unfortunately and this is an ongoing issue. He really has no plans to quit. He also does not wear compression socks he tells me that he cannot afford them. With that being said obviously I am not sure that this is really the reason behind why he does not like the compression socks my gut tells me he may have difficulty getting them on as well. Fortunately there is no signs of active infection at this time he does have a small ulcer on the anterior which actually began surface of his leg Christmas Eve 2020 he has larger wounds on the posterior surface of his leg which are larger and actually began 2 weeks ago. He does have a history of congestive heart failure as noted by documentation. He also has venous insufficiency which is chronic as well as lymphedema. He also has hypertension. Stanley Marks, Stanley Marks ([id number]) 02/03/2020 patient presents today for follow-up though I have not seen him last since 01/19/2020. He ended up in the hospital secondary to having issues with Covid. He was apparently very sick and not moving around a lot during that time. With that being said upon inspection today his wound actually appears to be doing significantly better compared to the previous evaluation even which is excellent news. The previous evaluation was the initial here in the clinic and at that time the wounds appeared to be doing much more severely in my opinion. Right now he has a lot of new skin growth which is excellent news. With that being said the wounds are also measuring significantly smaller and my hope is that  although the hospital stay obviously was not ideal that it is good to help these areas to heal much more effectively and quickly. We were using Curlex and Coban wraps for him Which is about all he can really tolerate. 02/09/2020 upon evaluation today patient actually appears to be doing much  better with regard to his ulcers on his left lower extremity. In general I feel like he is making excellent progress and the wounds in general seem to also be doing well from the standpoint of size as well as no evidence of infection. 02/16/2020 upon evaluation today patient actually appears to be doing quite well with regard to his lower extremity wounds. In fact he appears to be healed based on what I am seeing currently there is no signs of active infection at this time. No fevers, chills, nausea, vomiting, or diarrhea. 02/23/2020 upon evaluation today patient appears to be doing poorly in regard to his right lower extremity especially although his left lower extremity ulcers show signs of fairly significant edema at this point though there are no open wounds. He appears to be healed here. There is no evidence of active infection which is also good news. With that being said his caregiver I think is also his granddaughter actually did mention today that the patient does not take his fluid pills on a regular basis. In fact she even tells me that sometimes he throws the medicine away instead of taking it. The patient then went on to explain that it makes him urinate a lot which obviously is very difficult as well. 03/01/2020 upon evaluation today patient appears to be doing well with regard to his lower extremities bilaterally. He has been tolerating the dressing changes without complication. Fortunately there is no signs of active infection at this time. No fevers, chills, nausea, vomiting, or diarrhea. The patient has been taking his fluid pills, torsemide, and overall seems to be doing excellent in this regard. Objective Constitutional Obese and well-hydrated in no acute distress. Vitals Time Taken: 8:30 AM, Height: 74 in, Weight: 290 lbs, BMI: 37.2, Temperature: 98.0 F, Pulse: 71 bpm, Respiratory Rate: 20 breaths/min, Blood Pressure: 134/84 mmHg, Pulse Oximetry: 96 %. Respiratory normal  breathing without difficulty. Psychiatric this patient is able to make decisions and demonstrates good insight into disease process. Alert and Oriented x 3. pleasant and cooperative. General Notes: Patient's wounds currently on the right lower extremity appear to be healing and are very close to resolution overall very pleased in this regard. In regard to the left lower extremity appears to be completely healed he does have compression socks with him today. Integumentary (Hair, Skin) Wound #9 status is Open. Original cause of wound was Blister. The wound is located on the Right,Anterior Lower Leg. The wound measures 8cm length x 9cm width x 0.1cm depth; 56.549cm^2 area and 5.655cm^3 volume. There is Fat Layer (Subcutaneous Tissue) Exposed exposed. There is a medium amount of serosanguineous drainage noted. There is small (1-33%) pink granulation within the wound bed. There is a large (67-100%) amount of necrotic tissue within the wound bed including Adherent Slough. Assessment Active Problems ICD-10 Venous insufficiency (chronic) (peripheral) Lymphedema, not elsewhere classified Non-pressure chronic ulcer of other part of left lower leg with fat layer exposed Essential (primary) hypertension Stanley Marks, Stanley Marks ([id number]) Chronic combined systolic (congestive) and diastolic (congestive) heart failure Procedures Wound #9 Pre-procedure diagnosis of Wound #9 is a Venous Leg Ulcer located on the Right,Anterior Lower Leg . There was a Three Layer Compression Therapy Procedure with  a pre-treatment ABI of 1.2 by Montey Hora, RN. Post procedure Diagnosis Wound #9: Same as Pre-Procedure Plan Wound Cleansing: May shower with protection. - Do not get your wraps wet Secondary Dressing: Wound #9 Right,Anterior Lower Leg: XtraSorb Dressing Change Frequency: Change dressing every week Follow-up Appointments: Return Appointment in 1 week. - Please bring your compression socks with you to your next  appointment Edema Control: 3 Layer Compression System - Right Lower Extremity - anchor with unna Patient to wear own compression stockings - left leg Elevate legs to the level of the heart and pump ankles as often as possible Other: - Please bring your compression socks with you to your next appointment 1. My suggestion at this point is can be that we go ahead and continue with the XtraSorb for the right lower extremity followed by the 3 layer compression wrap which I think is doing well for him. 2. I am also can recommend at this point that the patient continue with the 3 layer compression wrap which also seems to be doing well for him. 3. I am also going to recommend at this point that the patient needs to elevate his legs is much as possible and continue with the torsemide which has done excellent for him his legs are very small compared to where he started. We will see patient back for reevaluation in 1 week here in the clinic. If anything worsens or changes patient will contact our office for additional recommendations. Electronic Signature(s) Signed: 03/01/2020 9:16:52 AM By: Worthy Keeler PA-C Entered By: Worthy Keeler on 03/01/2020 09:16:52 Stanley Marks ([id number]) -------------------------------------------------------------------------------- SuperBill Details Patient Name: Stanley Marks Date of Service: 03/01/2020 Medical Record Number: [id number] Patient Account Number: [id number] Date of Birth/Sex: November 11, 1941 (79 y.o. 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 Lymphedema, not elsewhere classified L97.822 Non-pressure chronic ulcer of other part of left lower leg with fat layer exposed I10 Essential (primary) hypertension I50.42  Chronic combined systolic (congestive) and diastolic (congestive) heart failure Facility Procedures CPT4 Code: [id number] Description: (Facility Use Only) [place] [facility] LEG Modifier: Quantity: 1 Physician Procedures CPT4 [id number] Description: [id number] - WC PHYS LEVEL 3 - EST PT Modifier: Quantity: 1 CPT4 Code: Description: ICD-10 Diagnosis Description 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

## 2020-03-08 ENCOUNTER — Encounter: Payer: Medicare Other | Admitting: Physician Assistant

## 2020-03-08 ENCOUNTER — Other Ambulatory Visit: Payer: Self-pay

## 2020-03-08 DIAGNOSIS — I872 Venous insufficiency (chronic) (peripheral): Secondary | ICD-10-CM | POA: Diagnosis not present

## 2020-03-08 NOTE — Progress Notes (Addendum)
DIXON, VROOM ([id number]) Visit Report for 03/08/2020 Chief Complaint Document Details Patient Name: Stanley Marks, Stanley Marks Date of Service: 03/08/2020 8:30 AM Medical Record Number: [id number] Patient Account Number: [id number] Date of Birth/Sex: [birth year] (79 y.o. M) Treating RN: Montey Hora Primary Care Provider: Lamonte Sakai Other Clinician: Referring Provider: Lamonte Sakai Treating Provider/Extender: Melburn Hake, Kayston Jodoin Weeks in Treatment: 7 Information Obtained from: Patient Chief Complaint Right LE Ulcers Electronic Signature(s) Signed: 03/08/2020 8:57:46 AM By: Worthy Keeler PA-C Previous Signature: 03/08/2020 8:51:46 AM Version By: Worthy Keeler PA-C Entered By: Worthy Keeler on 03/08/2020 08:57:46 Stanley Marks ([id number]) -------------------------------------------------------------------------------- HPI Details Patient Name: Stanley Marks Date of Service: 03/08/2020 8:30 AM Medical Record Number: [id number] Patient Account Number: [id number] Date of Birth/Sex: 1941/09/07 (79 y.o. M) Treating RN: Montey Hora Primary Care Provider: Lamonte Sakai Other Clinician: Referring Provider: Lamonte Sakai Treating Provider/Extender: Melburn Hake, Rune Mendez Weeks in Treatment: 7 History of Present Illness HPI Description: Pleasant 79 year old with history of chronic venous insufficiency and hypertension. No History of diabetes or peripheral arterial disease. He says that he stopped taking his "fluid pill" so that he wouldn't have to urinate as often. He subsequently developed bilateral lower extremity swelling and left calf ulcerations in late August 2016. Status post bilateral lower extremity endovenous ablation over 5 years ago in [place]. He does not regularly wear compression stockings. Denies any history of DVT. He is ambulating per his baseline. No claudication or rest pain. Tolerated 3 layer compression bandage with Prisma with significant improvement. Recently declined compression  bandages and has been using a Tubigrip for edema control. He returns to clinic for follow-up and is without complaints. No significant pain. No fever or chills. No drainage. READMISSION 10/14/2019 This is a 79 year old man who is in this clinic for years ago with a venous insufficiency wound on the left leg. He was discharged with compression stockings but he does not wear them. He states he developed a blister on the right anterior tibial area in October. He was in the ER on 10/26 after falling and noted to have a wound on his right leg/shin although the patient states the blister was already present. He was felt possibly to be mildly dehydrated he declined IV fluid. X-ray did not show any osseous abnormalities however he was noted to have severe right knee osteoarthritis. The patient has known chronic venous insufficiency. He states he had ablations in his bilateral lower legs many years ago. This seems well verified from the history noted above. He is not a diabetic and does not have known PAD. Past medical history; no diabetes, atrial fibrillation on Xarelto, history of basal cell cancer, COPD continued smoker, hypertension, peripheral vascular disease. ABIs in our clinic were 1.36 on the right and 1.24 on the left 11/11; patient with a venous insufficiency wound on the right anterior lower leg. Admitted to clinic last week. He complains bitterly about the 3 layer compression we put on him stating it was uncomfortable he could not sleep. He has previously not tolerated stockings very well as well. His ABIs in this clinic were noncompressible however his dorsalis pedis and posterior tibial pulses are easily palpable. He has decent edema control. We used Hydrofera Blue which apparently stuck to the wound. There are good improvements in wound dimensions 11/18; the patient's venous insufficiency wound on the right anterior lower leg is almost all epithelialized. However he arrives in the clinic with  2 skin tears on his forearm from a fall at noon yesterday 11/04/2019 on evaluation today  patient appears to be doing well with regard to his leg ulcer as well as his right forearm ulcers. He has been tolerating the dressing changes without complication. Fortunately there is no sign of active infection at this time. No fevers, chills, nausea, vomiting, or diarrhea. I am extremely pleased with how things seem to be progressing based on what I am seeing today. 12/2; the patient's wound on the right anterior lower leg is healed. He has severe chronic venous insufficiency and has had previous ablations on this side. Previous ablations were done in [place]. He has not consistently worn compression stockings. He has a Tubigrip stocking on his left leg. The areas that he injured on his right dorsal forearm have some surface scabbing however I think this is on its way to closure I do not think independently they need to have him continue in this clinic. Readmission: 01/19/2020 upon evaluation today patient presents for reevaluation after having been seen last here in the clinic on November 11, 2019. He was readmitted last year on October 14, 2019 here in the clinic concerning right lower extremity ulcers. He had ABIs performed at that time that appeared to be doing well. Overall though he was only able to tolerate a Curlex and Coban wrap anything stronger was too much for him. He is still smoking unfortunately and this is an ongoing issue. He really has no plans to quit. He also does not wear compression socks he tells me that he cannot afford them. With that being said obviously I am not sure that this is really the reason behind why he does not like the compression socks my gut tells me he may have difficulty getting them on as well. Fortunately there is no signs of active infection at this time he does have a small ulcer on the anterior which actually began surface of his leg Christmas Eve 2020 he has larger  wounds on the posterior surface of his leg which are larger and actually began 2 weeks ago. He does have a history of congestive heart failure as noted by documentation. He also has venous insufficiency which is chronic as well as lymphedema. He also has hypertension. 02/03/2020 patient presents today for follow-up though I have not seen him last since 01/19/2020. He ended up in the hospital secondary to having issues with Covid. He was apparently very sick and not moving around a lot during that time. With that being said upon inspection today his wound actually appears to be doing significantly better compared to the previous evaluation even which is excellent news. The previous evaluation was the initial here in the clinic and at that time the wounds appeared to be doing much more severely in my opinion. Right now he has a lot of new skin growth which is Stanley Marks, Stanley Marks ([id number]) excellent news. With that being said the wounds are also measuring significantly smaller and my hope is that although the hospital stay obviously was not ideal that it is good to help these areas to heal much more effectively and quickly. We were using Curlex and Coban wraps for him Which is about all he can really tolerate. 02/09/2020 upon evaluation today patient actually appears to be doing much better with regard to his ulcers on his left lower extremity. In general I feel like he is making excellent progress and the wounds in general seem to also be doing well from the standpoint of size as well as no evidence of infection. 02/16/2020 upon evaluation today patient actually  appears to be doing quite well with regard to his lower extremity wounds. In fact he appears to be healed based on what I am seeing currently there is no signs of active infection at this time. No fevers, chills, nausea, vomiting, or diarrhea. 02/23/2020 upon evaluation today patient appears to be doing poorly in regard to his right lower extremity  especially although his left lower extremity ulcers show signs of fairly significant edema at this point though there are no open wounds. He appears to be healed here. There is no evidence of active infection which is also good news. With that being said his caregiver I think is also his granddaughter actually did mention today that the patient does not take his fluid pills on a regular basis. In fact she even tells me that sometimes he throws the medicine away instead of taking it. The patient then went on to explain that it makes him urinate a lot which obviously is very difficult as well. 03/01/2020 upon evaluation today patient appears to be doing well with regard to his lower extremities bilaterally. He has been tolerating the dressing changes without complication. Fortunately there is no signs of active infection at this time. No fevers, chills, nausea, vomiting, or diarrhea. The patient has been taking his fluid pills, torsemide, and overall seems to be doing excellent in this regard. 03/08/2020 upon evaluation today patient actually appears to be doing excellent in regard to his right leg ulcers that are remaining. Fortunately there is no signs of active infection at this time. No fevers, chills, nausea, vomiting, or diarrhea. All the wounds that were open appear to be completely closed as of today. Electronic Signature(s) Signed: 03/08/2020 8:58:27 AM By: Worthy Keeler PA-C Entered By: Worthy Keeler on 03/08/2020 08:58:27 Stanley Marks, Stanley Marks ([id number]) -------------------------------------------------------------------------------- Physical Exam Details Patient Name: Stanley Marks Date of Service: 03/08/2020 8:30 AM Medical Record Number: [id number] Patient Account Number: [id number] Date of Birth/Sex: 05-Feb-1941 (79 y.o. M) Treating RN: Montey Hora Primary Care Provider: Lamonte Sakai Other Clinician: Referring Provider: Lamonte Sakai Treating Provider/Extender: Melburn Hake, Erin Uecker Weeks  in Treatment: 7 Constitutional Obese and well-hydrated in no acute distress. Respiratory normal breathing without difficulty. Psychiatric this patient is able to make decisions and demonstrates good insight into disease process. Alert and Oriented x 3. pleasant and cooperative. Notes Patient's wound bed currently showed signs of complete epithelization of the right lower extremity there is no signs of active infection and overall the patient seems to be doing excellent at this point. He has been taking his torsemide which I think has made a huge difference for him. He has also been using his compression sock on the left lower extremity which healed last week his right lower extremity looks to be ready for that this week. Overall I am extremely pleased with how things have progressed. Electronic Signature(s) Signed: 03/08/2020 8:59:37 AM By: Worthy Keeler PA-C Entered By: Worthy Keeler on 03/08/2020 08:59:36 Stanley Marks ([id number]) -------------------------------------------------------------------------------- Physician Orders Details Patient Name: Stanley Marks Date of Service: 03/08/2020 8:30 AM Medical Record Number: [id number] Patient Account Number: [id number] Date of Birth/Sex: 15-Dec-1940 (79 y.o. M) Treating RN: Cornell Barman Primary Care Provider: Lamonte Sakai Other Clinician: Referring Provider: Lamonte Sakai Treating Provider/Extender: Melburn Hake, Norissa Bartee Weeks in Treatment: 7 Verbal / Phone Orders: No Diagnosis Coding ICD-10 Coding Code Description I87.2 Venous insufficiency (chronic) (peripheral) I89.0 Lymphedema, not elsewhere classified L97.822 Non-pressure chronic ulcer of other part of left lower leg with fat layer exposed [place] (primary)  hypertension I50.42 Chronic combined systolic (congestive) and diastolic (congestive) heart failure Edema Control o Patient to wear own compression stockings Discharge From [facility] o Discharge from [place] complete Electronic Signature(s) Signed: 03/09/2020 11:21:31 AM By: Gretta Cool, BSN, RN, CWS, Kim RN, BSN Signed: 03/10/2020 5:29:46 PM By: Worthy Keeler PA-C Entered By: Gretta Cool, BSN, RN, CWS, Kim on 03/08/2020 08:59:18 Stanley Marks ([id number]) -------------------------------------------------------------------------------- Problem List Details Patient Name: Stanley Marks Date of Service: 03/08/2020 8:30 AM Medical Record Number: [id number] Patient Account Number: [id number] Date of Birth/Sex: 10-23-1941 (79 y.o. M) Treating RN: Montey Hora Primary Care Provider: Lamonte Sakai Other Clinician: Referring Provider: Lamonte Sakai Treating Provider/Extender: Melburn Hake, Muhsin Doris Weeks in Treatment: 7 Active Problems ICD-10 Evaluated Encounter Code Description Active Date Today Diagnosis I87.2 Venous insufficiency (chronic) (peripheral) 01/19/2020 No Yes I89.0 Lymphedema, not elsewhere classified 01/19/2020 No Yes L97.822 Non-pressure chronic ulcer of other part of left lower leg with fat layer 01/19/2020 No Yes exposed [place] (primary) hypertension 01/19/2020 No Yes I50.42 Chronic combined systolic (congestive) and diastolic (congestive) heart 01/19/2020 No Yes failure Inactive Problems Resolved Problems Electronic Signature(s) Signed: 03/08/2020 8:51:39 AM By: Worthy Keeler PA-C Entered By: Worthy Keeler on 03/08/2020 08:51:39 Stanley Marks ([id number]) -------------------------------------------------------------------------------- Progress Note Details Patient Name: Stanley Marks Date of Service: 03/08/2020 8:30 AM Medical Record Number: [id number] Patient Account Number: [id number] Date of Birth/Sex: August 19, 1941 (79 y.o. M) Treating RN: Montey Hora Primary Care Provider: Lamonte Sakai Other Clinician: Referring Provider: Lamonte Sakai Treating Provider/Extender: Melburn Hake, Ubah Radke Weeks in Treatment: 7 Subjective Chief Complaint Information obtained from  Patient Right LE Ulcers History of Present Illness (HPI) Pleasant 79 year old with history of chronic venous insufficiency and hypertension. No History of diabetes or peripheral arterial disease. He says that he stopped taking his "fluid pill" so that he wouldn't have to urinate as often. He subsequently developed bilateral lower extremity swelling and left calf ulcerations in late August 2016. Status post bilateral lower extremity endovenous ablation over 5 years ago in [place]. He does not regularly wear compression stockings. Denies any history of DVT. He is ambulating per his baseline. No claudication or rest pain. Tolerated 3 layer compression bandage with Prisma with significant improvement. Recently declined compression bandages and has been using a Tubigrip for edema control. He returns to clinic for follow-up and is without complaints. No significant pain. No fever or chills. No drainage. READMISSION 10/14/2019 This is a 79 year old man who is in this clinic for years ago with a venous insufficiency wound on the left leg. He was discharged with compression stockings but he does not wear them. He states he developed a blister on the right anterior tibial area in October. He was in the ER on 10/26 after falling and noted to have a wound on his right leg/shin although the patient states the blister was already present. He was felt possibly to be mildly dehydrated he declined IV fluid. X-ray did not show any osseous abnormalities however he was noted to have severe right knee osteoarthritis. The patient has known chronic venous insufficiency. He states he had ablations in his bilateral lower legs many years ago. This seems well verified from the history noted above. He is not a diabetic and does not have known PAD. Past medical history; no diabetes, atrial fibrillation on Xarelto, history of basal cell cancer, COPD continued smoker, hypertension, peripheral vascular disease. ABIs in our  clinic were 1.36 on the right and 1.24 on the left 11/11; patient with a venous  insufficiency wound on the right anterior lower leg. Admitted to clinic last week. He complains bitterly about the 3 layer compression we put on him stating it was uncomfortable he could not sleep. He has previously not tolerated stockings very well as well. His ABIs in this clinic were noncompressible however his dorsalis pedis and posterior tibial pulses are easily palpable. He has decent edema control. We used Hydrofera Blue which apparently stuck to the wound. There are good improvements in wound dimensions 11/18; the patient's venous insufficiency wound on the right anterior lower leg is almost all epithelialized. However he arrives in the clinic with 2 skin tears on his forearm from a fall at noon yesterday 11/04/2019 on evaluation today patient appears to be doing well with regard to his leg ulcer as well as his right forearm ulcers. He has been tolerating the dressing changes without complication. Fortunately there is no sign of active infection at this time. No fevers, chills, nausea, vomiting, or diarrhea. I am extremely pleased with how things seem to be progressing based on what I am seeing today. 12/2; the patient's wound on the right anterior lower leg is healed. He has severe chronic venous insufficiency and has had previous ablations on this side. Previous ablations were done in [place]. He has not consistently worn compression stockings. He has a Tubigrip stocking on his left leg. The areas that he injured on his right dorsal forearm have some surface scabbing however I think this is on its way to closure I do not think independently they need to have him continue in this clinic. Readmission: 01/19/2020 upon evaluation today patient presents for reevaluation after having been seen last here in the clinic on November 11, 2019. He was readmitted last year on October 14, 2019 here in the clinic concerning  right lower extremity ulcers. He had ABIs performed at that time that appeared to be doing well. Overall though he was only able to tolerate a Curlex and Coban wrap anything stronger was too much for him. He is still smoking unfortunately and this is an ongoing issue. He really has no plans to quit. He also does not wear compression socks he tells me that he cannot afford them. With that being said obviously I am not sure that this is really the reason behind why he does not like the compression socks my gut tells me he may have difficulty getting them on as well. Fortunately there is no signs of active infection at this time he does have a small ulcer on the anterior which actually began surface of his leg Christmas Eve 2020 he has larger wounds on the posterior surface of his leg which are larger and actually began 2 weeks ago. He does have a history of congestive heart failure as noted by documentation. He also has venous insufficiency which is chronic as well as lymphedema. He also has hypertension. Stanley Marks, Stanley Marks ([id number]) 02/03/2020 patient presents today for follow-up though I have not seen him last since 01/19/2020. He ended up in the hospital secondary to having issues with Covid. He was apparently very sick and not moving around a lot during that time. With that being said upon inspection today his wound actually appears to be doing significantly better compared to the previous evaluation even which is excellent news. The previous evaluation was the initial here in the clinic and at that time the wounds appeared to be doing much more severely in my opinion. Right now he has a lot of new skin  growth which is excellent news. With that being said the wounds are also measuring significantly smaller and my hope is that although the hospital stay obviously was not ideal that it is good to help these areas to heal much more effectively and quickly. We were using Curlex and Coban wraps for him Which  is about all he can really tolerate. 02/09/2020 upon evaluation today patient actually appears to be doing much better with regard to his ulcers on his left lower extremity. In general I feel like he is making excellent progress and the wounds in general seem to also be doing well from the standpoint of size as well as no evidence of infection. 02/16/2020 upon evaluation today patient actually appears to be doing quite well with regard to his lower extremity wounds. In fact he appears to be healed based on what I am seeing currently there is no signs of active infection at this time. No fevers, chills, nausea, vomiting, or diarrhea. 02/23/2020 upon evaluation today patient appears to be doing poorly in regard to his right lower extremity especially although his left lower extremity ulcers show signs of fairly significant edema at this point though there are no open wounds. He appears to be healed here. There is no evidence of active infection which is also good news. With that being said his caregiver I think is also his granddaughter actually did mention today that the patient does not take his fluid pills on a regular basis. In fact she even tells me that sometimes he throws the medicine away instead of taking it. The patient then went on to explain that it makes him urinate a lot which obviously is very difficult as well. 03/01/2020 upon evaluation today patient appears to be doing well with regard to his lower extremities bilaterally. He has been tolerating the dressing changes without complication. Fortunately there is no signs of active infection at this time. No fevers, chills, nausea, vomiting, or diarrhea. The patient has been taking his fluid pills, torsemide, and overall seems to be doing excellent in this regard. 03/08/2020 upon evaluation today patient actually appears to be doing excellent in regard to his right leg ulcers that are remaining. Fortunately there is no signs of active infection  at this time. No fevers, chills, nausea, vomiting, or diarrhea. All the wounds that were open appear to be completely closed as of today. Objective Constitutional Obese and well-hydrated in no acute distress. Vitals Time Taken: 8:38 AM, Height: 74 in, Weight: 290 lbs, BMI: 37.2, Temperature: 97.8 F, Pulse: 68 bpm, Respiratory Rate: 18 breaths/min, Blood Pressure: 122/70 mmHg. Respiratory normal breathing without difficulty. Psychiatric this patient is able to make decisions and demonstrates good insight into disease process. Alert and Oriented x 3. pleasant and cooperative. General Notes: Patient's wound bed currently showed signs of complete epithelization of the right lower extremity there is no signs of active infection and overall the patient seems to be doing excellent at this point. He has been taking his torsemide which I think has made a huge difference for him. He has also been using his compression sock on the left lower extremity which healed last week his right lower extremity looks to be ready for that this week. Overall I am extremely pleased with how things have progressed. Integumentary (Hair, Skin) Wound #9 status is Healed - Epithelialized. Original cause of wound was Blister. The wound is located on the Right,Anterior Lower Leg. The wound measures 0cm length x 0cm width x 0cm depth; 0cm^2 area  and 0cm^3 volume. There is no tunneling or undermining noted. There is a medium amount of serosanguineous drainage noted. The wound margin is indistinct and nonvisible. There is no granulation within the wound bed. There is no necrotic tissue within the wound bed. Assessment Stanley Marks, Stanley Marks ([id number]) Active Problems ICD-10 Venous insufficiency (chronic) (peripheral) Lymphedema, not elsewhere classified Non-pressure chronic ulcer of other part of left lower leg with fat layer exposed [place] (primary) hypertension Chronic combined systolic (congestive) and diastolic  (congestive) heart failure Plan Edema Control: Patient to wear own compression stockings Discharge From [facility] Services: Discharge from [place] complete 1. My suggestion at this point is that the patient be discharged from wound care services I think he is completely healed and doing well I think he has a good plan for keeping things under control going forward. He is going to continue to take the torsemide, use his compression socks, and elevate his legs when he is sitting. He has been doing that over the past week and states he feels "much better". 2. Obviously he will continue to monitor for any open wounds if anything occurs he will contact the office and let me know. We will see the patient back for a follow-up visit as needed. Electronic Signature(s) Signed: 03/08/2020 9:00:19 AM By: Worthy Keeler PA-C Entered By: Worthy Keeler on 03/08/2020 09:00:18 Stanley Marks ([id number]) -------------------------------------------------------------------------------- SuperBill Details Patient Name: Stanley Marks Date of Service: 03/08/2020 Medical Record Number: [id number] Patient Account Number: [id number] Date of Birth/Sex: 1941/05/07 (79 y.o. 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 Lymphedema, not elsewhere classified L97.822 Non-pressure chronic ulcer of other part of left lower leg with fat layer exposed I10 Essential (primary) hypertension I50.42 Chronic combined systolic (congestive) and diastolic (congestive) heart failure Facility Procedures CPT4 Code: [id number] Description: [phone] - WOUND CARE VISIT-LEV 2 EST PT Modifier: Quantity: 1 Physician Procedures CPT4 Code: [id number] Description: [id number] - WC PHYS LEVEL 3 -  EST PT Modifier: Quantity: 1 CPT4 Code: Description: ICD-10 Diagnosis Description 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

## 2020-03-09 NOTE — Progress Notes (Addendum)
Stanley Marks, Stanley Marks ([id number]) Visit Report for 03/08/2020 Arrival Information Details Patient Name: Stanley Marks, Stanley Marks Date of Service: 03/08/2020 8:30 AM Medical Record Number: [id number] Patient Account Number: [id number] Date of Birth/Sex: 04-02-1941 (79 y.o. M) Treating RN: Cornell Barman Primary Care Glendell Fouse: Lamonte Sakai Other Clinician: Referring Malak Orantes: Lamonte Sakai Treating Damyia Strider/Extender: Melburn Hake, HOYT Weeks in Treatment: 7 Visit Information History Since Last Visit Added or deleted any medications: No Patient Arrived: Stanley Marks Any new allergies or adverse reactions: No Arrival Time: 08:39 Had a fall or experienced change in No Accompanied By: daughter activities of daily living that may affect Transfer Assistance: None risk of falls: Patient Identification Verified: Yes Signs or symptoms of abuse/neglect since last visito No Secondary Verification Process Completed: Yes Hospitalized since last visit: No Patient Requires Transmission-Based Precautions: No Implantable device outside of the clinic excluding No Patient Has Alerts: No cellular tissue based products placed in the center since last visit: Has Dressing in Place as Prescribed: Yes Has Compression in Place as Prescribed: Yes Pain Present Now: No Electronic Signature(s) Signed: 03/09/2020 11:21:31 AM By: Gretta Cool, BSN, RN, CWS, Kim RN, BSN Entered By: Gretta Cool, BSN, RN, CWS, Kim on 03/08/2020 08:41:22 Stanley Marks ([id number]) -------------------------------------------------------------------------------- Clinic Level of Care Assessment Details Patient Name: Stanley Marks Date of Service: 03/08/2020 8:30 AM Medical Record Number: [id number] Patient Account Number: [id number] Date of Birth/Sex: 05-17-1941 (78 y.o. M) Treating RN: Cornell Barman Primary Care Lonia Roane: Lamonte Sakai Other Clinician: Referring Sheryn Aldaz: Lamonte Sakai Treating Miller Edgington/Extender: Melburn Hake, HOYT Weeks in Treatment: 7 Clinic Level of Care  Assessment Items TOOL 4 Quantity Score []  - Use when only an EandM is performed on FOLLOW-UP visit 0 ASSESSMENTS - Nursing Assessment / Reassessment X - Reassessment of Co-morbidities (includes updates in patient status) 1 10 X- 1 5 Reassessment of Adherence to Treatment Plan ASSESSMENTS - Wound and Skin Assessment / Reassessment X - Simple Wound Assessment / Reassessment - one wound 1 5 []  - 0 Complex Wound Assessment / Reassessment - multiple wounds []  - 0 Dermatologic / Skin Assessment (not related to wound area) ASSESSMENTS - Focused Assessment X - Circumferential Edema Measurements - multi extremities 1 5 []  - 0 Nutritional Assessment / Counseling / Intervention X- 1 5 Lower Extremity Assessment (monofilament, tuning fork, pulses) []  - 0 Peripheral Arterial Disease Assessment (using hand held doppler) ASSESSMENTS - Ostomy and/or Continence Assessment and Care []  - Incontinence Assessment and Management 0 []  - 0 Ostomy Care Assessment and Management (repouching, etc.) PROCESS - Coordination of Care X - Simple Patient / Family Education for ongoing care 1 15 []  - 0 Complex (extensive) Patient / Family Education for ongoing care []  - 0 Staff obtains Programmer, systems, Records, Test Results / Process Orders []  - 0 Staff telephones HHA, Nursing Homes / Clarify orders / etc []  - 0 Routine Transfer to another Facility (non-emergent condition) []  - 0 Routine Hospital Admission (non-emergent condition) []  - 0 New Admissions / Biomedical engineer / Ordering NPWT, Apligraf, etc. []  - 0 Emergency Hospital Admission (emergent condition) X- 1 10 Simple Discharge Coordination []  - 0 Complex (extensive) Discharge Coordination PROCESS - Special Needs []  - Pediatric / Minor Patient Management 0 []  - 0 Isolation Patient Management []  - 0 Hearing / Language / Visual special needs []  - 0 Assessment of Community assistance (transportation, D/C planning, etc.) Stanley Marks, Stanley Marks  ([id number]) []  - 0 Additional assistance / Altered mentation []  - 0 Support Surface(s) Assessment (bed, cushion, seat, etc.) INTERVENTIONS - Wound Cleansing / Measurement X - Simple Wound  Cleansing - one wound 1 5 []  - 0 Complex Wound Cleansing - multiple wounds X- 1 5 Wound Imaging (photographs - any number of wounds) []  - 0 Wound Tracing (instead of photographs) []  - 0 Simple Wound Measurement - one wound []  - 0 Complex Wound Measurement - multiple wounds INTERVENTIONS - Wound Dressings []  - Small Wound Dressing one or multiple wounds 0 []  - 0 Medium Wound Dressing one or multiple wounds []  - 0 Large Wound Dressing one or multiple wounds []  - 0 Application of Medications - topical []  - 0 Application of Medications - injection INTERVENTIONS - Miscellaneous []  - External ear exam 0 []  - 0 Specimen Collection (cultures, biopsies, blood, body fluids, etc.) []  - 0 Specimen(s) / Culture(s) sent or taken to Lab for analysis []  - 0 Patient Transfer (multiple staff / Civil Service fast streamer / Similar devices) []  - 0 Simple Staple / Suture removal (25 or less) []  - 0 Complex Staple / Suture removal (26 or more) []  - 0 Hypo / Hyperglycemic Management (close monitor of Blood Glucose) []  - 0 Ankle / Brachial Index (ABI) - do not check if billed separately X- 1 5 Vital Signs Has the patient been seen at the hospital within the last three years: Yes Total Score: 70 Level Of Care: New/Established - Level 2 Electronic Signature(s) Signed: 03/09/2020 11:21:31 AM By: Gretta Cool, BSN, RN, CWS, Kim RN, BSN Entered By: Gretta Cool, BSN, RN, CWS, Kim on 03/08/2020 09:03:29 Stanley Marks ([id number]) -------------------------------------------------------------------------------- Encounter Discharge Information Details Patient Name: Stanley Marks Date of Service: 03/08/2020 8:30 AM Medical Record Number: [id number] Patient Account Number: [id number] Date of Birth/Sex: [birth year] (78 y.o. M) Treating RN:  Cornell Barman Primary Care Juvia Aerts: Lamonte Sakai Other Clinician: Referring Angeleah Labrake: Lamonte Sakai Treating Jess Sulak/Extender: Melburn Hake, HOYT Weeks in Treatment: 7 Encounter Discharge Information Items Discharge Condition: Stable Ambulatory Status: Walker Discharge Destination: Home Transportation: Private Auto Accompanied By: daughter Schedule Follow-up Appointment: No Clinical Summary of Care: Electronic Signature(s) Signed: 03/09/2020 11:21:31 AM By: Gretta Cool, BSN, RN, CWS, Kim RN, BSN Entered By: Gretta Cool, BSN, RN, CWS, Kim on 03/08/2020 09:07:22 Stanley Marks ([id number]) -------------------------------------------------------------------------------- Lower Extremity Assessment Details Patient Name: Stanley Marks Date of Service: 03/08/2020 8:30 AM Medical Record Number: [id number] Patient Account Number: [id number] Date of Birth/Sex: 04-12-1941 (78 y.o. M) Treating RN: Cornell Barman Primary Care Arrayah Connors: Lamonte Sakai Other Clinician: Referring Rinnah Peppel: Lamonte Sakai Treating Anisten Tomassi/Extender: Melburn Hake, HOYT Weeks in Treatment: 7 Edema Assessment Assessed: [Left: No] [Right: Yes] Edema: [Left: Ye] [Right: s] Calf Left: Right: Point of Measurement: 35 cm From Medial Instep cm 39 cm Ankle Left: Right: Point of Measurement: 13 cm From Medial Instep cm 25 cm Vascular Assessment Pulses: Dorsalis Pedis Palpable: [Right:Yes] Electronic Signature(s) Signed: 03/09/2020 11:21:31 AM By: Gretta Cool, BSN, RN, CWS, Kim RN, BSN Entered By: Gretta Cool, BSN, RN, CWS, Kim on 03/08/2020 08:47:04 Stanley Marks ([id number]) -------------------------------------------------------------------------------- Multi Wound Chart Details Patient Name: Stanley Marks Date of Service: 03/08/2020 8:30 AM Medical Record Number: [id number] Patient Account Number: [id number] Date of Birth/Sex: 1941-05-03 (78 y.o. M) Treating RN: Cornell Barman Primary Care Tarris Delbene: Lamonte Sakai Other Clinician: Referring Kimo Bancroft: Lamonte Sakai Treating Sigurd Pugh/Extender: Melburn Hake, HOYT Weeks in Treatment: 7 Vital Signs Height(in): 31 Pulse(bpm): 34 Weight(lbs): 290 Blood Pressure(mmHg): 122/70 Body Mass Index(BMI): 37 Temperature(F): 97.8 Respiratory Rate(breaths/min): 18 Photos: [N/A:N/A] Wound Location: Right Lower Leg - Anterior N/A N/A Wounding Event: Blister N/A N/A Primary Etiology: Venous Leg Ulcer N/A N/A Comorbid History: Cataracts, Chronic Obstructive N/A N/A Pulmonary Disease (COPD), Arrhythmia, Congestive  Heart Failure, Hypertension, Osteomyelitis Date Acquired: 02/18/2020 N/A N/A Weeks of Treatment: 2 N/A N/A Wound Status: Open N/A N/A Measurements L x W x D (cm) 0.1x0.1x0.1 N/A N/A Area (cm) : 0.008 N/A N/A Volume (cm) : 0.001 N/A N/A % Reduction in Area: 100.00% N/A N/A % Reduction in Volume: 100.00% N/A N/A Classification: Full Thickness Without Exposed N/A N/A Support Structures Exudate Amount: Medium N/A N/A Exudate Type: Serosanguineous N/A N/A Exudate Color: red, brown N/A N/A Wound Margin: Indistinct, nonvisible N/A N/A Granulation Amount: None Present (0%) N/A N/A Necrotic Amount: None Present (0%) N/A N/A Exposed Structures: Fascia: No N/A N/A Fat Layer (Subcutaneous Tissue) Exposed: No Tendon: No Muscle: No Joint: No Bone: No Epithelialization: Large (67-100%) N/A N/A Treatment Notes Electronic Signature(s) Signed: 03/09/2020 11:21:31 AM By: Gretta Cool, BSN, RN, CWS, Kim RN, BSN Stanley Marks, Stanley Marks ([id number]) Entered By: Gretta Cool, BSN, RN, CWS, Kim on 03/08/2020 08:55:00 Stanley Marks ([id number]) -------------------------------------------------------------------------------- Multi-Disciplinary Care Plan Details Patient Name: Stanley Marks Date of Service: 03/08/2020 8:30 AM Medical Record Number: [id number] Patient Account Number: [id number] Date of Birth/Sex: 09/04/1941 (78 y.o. M) Treating RN: Cornell Barman Primary Care Julian Askin: Lamonte Sakai Other Clinician: Referring  Jeriann Sayres: Lamonte Sakai Treating Sadira Standard/Extender: Melburn Hake, HOYT Weeks in Treatment: 7 Active Inactive Electronic Signature(s) Signed: 03/14/2020 1:28:04 PM By: Gretta Cool, BSN, RN, CWS, Kim RN, BSN Previous Signature: 03/09/2020 11:21:31 AM Version By: Gretta Cool, BSN, RN, CWS, Kim RN, BSN Entered By: Gretta Cool, BSN, RN, CWS, Kim on 03/14/2020 13:28:03 Stanley Marks ([id number]) -------------------------------------------------------------------------------- Pain Assessment Details Patient Name: Stanley Marks Date of Service: 03/08/2020 8:30 AM Medical Record Number: [id number] Patient Account Number: [id number] Date of Birth/Sex: 22-Mar-1941 (78 y.o. M) Treating RN: Cornell Barman Primary Care Jacquel Redditt: Lamonte Sakai Other Clinician: Referring Anahita Cua: Lamonte Sakai Treating Lenus Trauger/Extender: Melburn Hake, HOYT Weeks in Treatment: 7 Active Problems Location of Pain Severity and Description of Pain Patient Has Paino No Site Locations Pain Management and Medication Current Pain Management: Electronic Signature(s) Signed: 03/09/2020 11:21:31 AM By: Gretta Cool, BSN, RN, CWS, Kim RN, BSN Entered By: Gretta Cool, BSN, RN, CWS, Kim on 03/08/2020 08:42:32 Stanley Marks ([id number]) -------------------------------------------------------------------------------- Patient/Caregiver Education Details Patient Name: Stanley Marks Date of Service: 03/08/2020 8:30 AM Medical Record Number: [id number] Patient Account Number: [id number] Date of Birth/Gender: 02/01/1941 (78 y.o. M) Treating RN: Cornell Barman Primary Care Physician: Lamonte Sakai Other Clinician: Referring Physician: Lamonte Sakai Treating Physician/Extender: Sharalyn Ink in Treatment: 7 Education Assessment Education Provided To: Patient Education Topics Provided Venous: Handouts: Controlling Swelling with Compression Stockings Methods: Explain/Verbal Responses: State content correctly Electronic Signature(s) Signed: 03/09/2020 11:21:31 AM By: Gretta Cool,  BSN, RN, CWS, Kim RN, BSN Entered By: Gretta Cool, BSN, RN, CWS, Kim on 03/08/2020 Stanley Marks, Stanley Marks ([id number]) -------------------------------------------------------------------------------- Wound Assessment Details Patient Name: Stanley Marks Date of Service: 03/08/2020 8:30 AM Medical Record Number: [id number] Patient Account Number: [id number] Date of Birth/Sex: [birth year] (78 y.o. M) Treating RN: Cornell Barman Primary Care Jennesis Ramaswamy: Lamonte Sakai Other Clinician: Referring Delaila Nand: Lamonte Sakai Treating Sapphira Harjo/Extender: Melburn Hake, HOYT Weeks in Treatment: 7 Wound Status Wound Number: 9 Primary Venous Leg Ulcer Etiology: Wound Location: Right, Anterior Lower Leg Wound Healed - Epithelialized Wounding Event: Blister Status: Date Acquired: 02/18/2020 Comorbid Cataracts, Chronic Obstructive Pulmonary Disease (COPD), Weeks Of Treatment: 2 History: Arrhythmia, Congestive Heart Failure, Hypertension, Clustered Wound: No Osteomyelitis Photos Wound Measurements Length: (cm) Width: (cm) Depth: (cm) Area: (cm) Volume: (cm) 0 % Reduction in Area: 100% 0 % Reduction in Volume: 100% 0 Epithelialization: Large (67-100%) 0 Tunneling: No 0 Undermining: No Wound Description  Classification: Full Thickness Without Exposed Support Structu Wound Margin: Indistinct, nonvisible Exudate Amount: Medium Exudate Type: Serosanguineous Exudate Color: red, brown res Foul Odor After Cleansing: No Slough/Fibrino Yes Wound Bed Granulation Amount: None Present (0%) Exposed Structure Necrotic Amount: None Present (0%) Fascia Exposed: No Fat Layer (Subcutaneous Tissue) Exposed: No Tendon Exposed: No Muscle Exposed: No Joint Exposed: No Bone Exposed: No Electronic Signature(s) Signed: 03/09/2020 11:21:31 AM By: Gretta Cool, BSN, RN, CWS, Kim RN, BSN Entered By: Gretta Cool, BSN, RN, CWS, Kim on 03/08/2020 08:55:35 Stanley Marks  ([id number]) -------------------------------------------------------------------------------- Vitals Details Patient Name: Stanley Marks Date of Service: 03/08/2020 8:30 AM Medical Record Number: [id number] Patient Account Number: [id number] Date of Birth/Sex: [birth year] (78 y.o. M) Treating RN: Cornell Barman Primary Care Szymon Foiles: Lamonte Sakai Other Clinician: Referring Malavika Lira: Lamonte Sakai Treating Karilynn Carranza/Extender: Melburn Hake, HOYT Weeks in Treatment: 7 Vital Signs Time Taken: 08:38 Temperature (F): 97.8 Height (in): 74 Pulse (bpm): 68 Weight (lbs): 290 Respiratory Rate (breaths/min): 18 Body Mass Index (BMI): 37.2 Blood Pressure (mmHg): 122/70 Reference Range: 80 - 120 mg / dl Electronic Signature(s) Signed: 03/09/2020 11:21:31 AM By: Gretta Cool, BSN, RN, CWS, Kim RN, BSN Entered By: Gretta Cool, BSN, RN, CWS, Kim on 03/08/2020 08:42:02

## 2020-03-23 ENCOUNTER — Other Ambulatory Visit: Payer: Self-pay

## 2020-03-23 ENCOUNTER — Ambulatory Visit (INDEPENDENT_AMBULATORY_CARE_PROVIDER_SITE_OTHER): Payer: Medicare Other | Admitting: Dermatology

## 2020-03-23 DIAGNOSIS — L578 Other skin changes due to chronic exposure to nonionizing radiation: Secondary | ICD-10-CM

## 2020-03-23 DIAGNOSIS — C44629 Squamous cell carcinoma of skin of left upper limb, including shoulder: Secondary | ICD-10-CM

## 2020-03-23 DIAGNOSIS — L570 Actinic keratosis: Secondary | ICD-10-CM | POA: Diagnosis not present

## 2020-03-23 DIAGNOSIS — Z85828 Personal history of other malignant neoplasm of skin: Secondary | ICD-10-CM

## 2020-03-23 DIAGNOSIS — Z86018 Personal history of other benign neoplasm: Secondary | ICD-10-CM

## 2020-03-23 DIAGNOSIS — D485 Neoplasm of uncertain behavior of skin: Secondary | ICD-10-CM

## 2020-03-23 NOTE — Progress Notes (Signed)
   Follow-Up Visit   Subjective  Stanley Marks is a 79 y.o. male who presents for the following: Other (Spot - left hand x ~10 days. Growing pretty fast and very sore.).  He has history of precancers and skin cancers and actinic changes.  The following portions of the chart were reviewed this encounter and updated as appropriate: Tobacco  Allergies  Meds  Problems  Med Hx  Surg Hx  Fam Hx      Review of Systems: No other skin or systemic complaints.  Objective  Well appearing patient in no apparent distress; mood and affect are within normal limits.  A focused examination was performed including left dorsum hand. Relevant physical exam findings are noted in the Assessment and Plan.  Objective  Hands: Erythematous thin papules/macules with gritty scale.   Objective  Left Dorsal Hand: 1.2 cm Hyperkeratotic papule.  Assessment & Plan    Actinic Damage - diffuse scaly erythematous macules with underlying dyspigmentation - Recommend daily broad spectrum sunscreen SPF 30+ to sun-exposed areas, reapply every 2 hours as needed.  - Call for new or changing lesions.   AK (actinic keratosis) Hands  Will plan LN2 on follow up.  Neoplasm of uncertain behavior of skin Left Dorsal Hand  Epidermal / dermal shaving  Lesion length (cm):  1.2 Lesion width (cm):  1.2 Margin per side (cm):  0.2 Total excision diameter (cm):  1.6 Informed consent: discussed and consent obtained   Timeout: patient name, date of birth, surgical site, and procedure verified   Procedure prep:  Patient was prepped and draped in usual sterile fashion Prep type:  Isopropyl alcohol Anesthesia: the lesion was anesthetized in a standard fashion   Anesthetic:  1% lidocaine w/ epinephrine 1-100,000 buffered w/ 8.4% NaHCO3 Instrument used: flexible razor blade   Hemostasis achieved with: pressure, aluminum chloride and electrodesiccation   Outcome: patient tolerated procedure well   Post-procedure  details: sterile dressing applied and wound care instructions given   Dressing type: bandage and petrolatum    Destruction of lesion Complexity: extensive   Destruction method: electrodesiccation and curettage   Informed consent: discussed and consent obtained   Curettage performed in three different directions: Yes   Electrodesiccation performed over the curetted area: Yes   Lesion length (cm):  1.2 Lesion width (cm):  1.2 Margin per side (cm):  0.2 Final wound size (cm):  1.6 Hemostasis achieved with:  pressure, aluminum chloride and electrodesiccation Outcome: patient tolerated procedure well with no complications   Post-procedure details: sterile dressing applied and wound care instructions given   Dressing type: bandage and petrolatum    Specimen 1 - Surgical pathology Differential Diagnosis: SCC vs other  Check Margins: No 1.2 cm Hyperkeratotic papule. EDC today  SCC (squamous cell carcinoma), hand, left  Return in about 4 weeks (around 04/20/2020).   I, Ashok Cordia, CMA, am acting as scribe for Sarina Ser, MD . Documentation: I have reviewed the above documentation for accuracy and completeness, and I agree with the above.  Sarina Ser, MD

## 2020-03-23 NOTE — Patient Instructions (Signed)

## 2020-03-24 ENCOUNTER — Encounter: Payer: Self-pay | Admitting: Dermatology

## 2020-03-28 ENCOUNTER — Telehealth: Payer: Self-pay

## 2020-03-28 NOTE — Telephone Encounter (Signed)
-----   Message from Ralene Bathe, MD sent at 03/28/2020  9:43 AM EDT ----- Skin , left dorsal hand WELL DIFFERENTIATED SQUAMOUS CELL CARCINOMA  Cancer - SCC Already treated with [facility]

## 2020-03-28 NOTE — Telephone Encounter (Signed)
Left message for patient to call office for results. °

## 2020-03-29 ENCOUNTER — Telehealth: Payer: Self-pay

## 2020-03-29 NOTE — Telephone Encounter (Signed)
Left message on patient's voice mail to return my call.

## 2020-04-07 ENCOUNTER — Telehealth: Payer: Self-pay

## 2020-04-07 NOTE — Telephone Encounter (Signed)
Patient informed of pathology results. Patient states he thinks it may be infected so he cleaned it with H2O2. I offered him an appointment to come in and have it checked, but he declined. He states that he will call us back if he needs anything.

## 2020-04-16 ENCOUNTER — Ambulatory Visit: Payer: Medicare Other | Attending: Internal Medicine

## 2020-04-27 ENCOUNTER — Ambulatory Visit: Payer: Medicare Other | Admitting: Dermatology

## 2020-07-16 ENCOUNTER — Other Ambulatory Visit: Payer: Self-pay

## 2020-07-16 ENCOUNTER — Emergency Department: Payer: Medicare Other

## 2020-07-16 ENCOUNTER — Encounter: Payer: Self-pay | Admitting: Emergency Medicine

## 2020-07-16 DIAGNOSIS — A4151 Sepsis due to Escherichia coli [E. coli]: Secondary | ICD-10-CM | POA: Diagnosis not present

## 2020-07-16 DIAGNOSIS — K8000 Calculus of gallbladder with acute cholecystitis without obstruction: Secondary | ICD-10-CM | POA: Diagnosis not present

## 2020-07-16 DIAGNOSIS — K8050 Calculus of bile duct without cholangitis or cholecystitis without obstruction: Secondary | ICD-10-CM | POA: Insufficient documentation

## 2020-07-16 DIAGNOSIS — Z85828 Personal history of other malignant neoplasm of skin: Secondary | ICD-10-CM | POA: Insufficient documentation

## 2020-07-16 DIAGNOSIS — Z8616 Personal history of COVID-19: Secondary | ICD-10-CM | POA: Insufficient documentation

## 2020-07-16 DIAGNOSIS — R112 Nausea with vomiting, unspecified: Secondary | ICD-10-CM | POA: Insufficient documentation

## 2020-07-16 DIAGNOSIS — J441 Chronic obstructive pulmonary disease with (acute) exacerbation: Secondary | ICD-10-CM | POA: Insufficient documentation

## 2020-07-16 DIAGNOSIS — I110 Hypertensive heart disease with heart failure: Secondary | ICD-10-CM | POA: Insufficient documentation

## 2020-07-16 DIAGNOSIS — Z79899 Other long term (current) drug therapy: Secondary | ICD-10-CM | POA: Insufficient documentation

## 2020-07-16 DIAGNOSIS — I5022 Chronic systolic (congestive) heart failure: Secondary | ICD-10-CM | POA: Insufficient documentation

## 2020-07-16 DIAGNOSIS — R1011 Right upper quadrant pain: Secondary | ICD-10-CM | POA: Insufficient documentation

## 2020-07-16 DIAGNOSIS — R197 Diarrhea, unspecified: Secondary | ICD-10-CM | POA: Insufficient documentation

## 2020-07-16 DIAGNOSIS — F17210 Nicotine dependence, cigarettes, uncomplicated: Secondary | ICD-10-CM | POA: Insufficient documentation

## 2020-07-16 LAB — COMPREHENSIVE METABOLIC PANEL
ALT: 17 U/L (ref 0–44)
AST: 12 U/L — ABNORMAL LOW (ref 15–41)
Albumin: 3.8 g/dL (ref 3.5–5.0)
Alkaline Phosphatase: 72 U/L (ref 38–126)
Anion gap: 11 (ref 5–15)
BUN: 16 mg/dL (ref 8–23)
CO2: 29 mmol/L (ref 22–32)
Calcium: 10.2 mg/dL (ref 8.9–10.3)
Chloride: 102 mmol/L (ref 98–111)
Creatinine, Ser: 1.42 mg/dL — ABNORMAL HIGH (ref 0.61–1.24)
GFR calc Af Amer: 55 mL/min — ABNORMAL LOW (ref >60)
GFR calc non Af Amer: 48 mL/min — ABNORMAL LOW (ref >60)
Glucose, Bld: 147 mg/dL — ABNORMAL HIGH (ref 70–99)
Potassium: 4.5 mmol/L (ref 3.5–5.1)
Sodium: 142 mmol/L (ref 135–145)
Total Bilirubin: 1 mg/dL (ref 0.3–1.2)
Total Protein: 7.8 g/dL (ref 6.5–8.1)

## 2020-07-16 LAB — CBC
HCT: 51.8 % (ref 39.0–52.0)
Hemoglobin: 17.1 g/dL — ABNORMAL HIGH (ref 13.0–17.0)
MCH: 30.2 pg (ref 26.0–34.0)
MCHC: 33 g/dL (ref 30.0–36.0)
MCV: 91.4 fL (ref 80.0–100.0)
Platelets: 228 10*3/uL (ref 150–400)
RBC: 5.67 MIL/uL (ref 4.22–5.81)
RDW: 16.5 % — ABNORMAL HIGH (ref 11.5–15.5)
WBC: 14.2 10*3/uL — ABNORMAL HIGH (ref 4.0–10.5)
nRBC: 0 % (ref 0.0–0.2)

## 2020-07-16 LAB — LIPASE, BLOOD: Lipase: 34 U/L (ref 11–51)

## 2020-07-16 LAB — TROPONIN I (HIGH SENSITIVITY): Troponin I (High Sensitivity): 4 ng/L (ref <18)

## 2020-07-16 MED ORDER — ONDANSETRON 4 MG PO TBDP
ORAL_TABLET | ORAL | Status: AC
  Administered 2020-07-16: 4 mg via ORAL
  Filled 2020-07-16: qty 1

## 2020-07-16 MED ORDER — SODIUM CHLORIDE 0.9% FLUSH: 3.0000 mL | Freq: Once | INTRAVENOUS | Status: DC

## 2020-07-16 MED ORDER — ONDANSETRON 4 MG PO TBDP: 4.0000 mg | ORAL_TABLET | Freq: Once | ORAL | Status: AC | PRN

## 2020-07-16 NOTE — ED Triage Notes (Signed)
Pt to ED via [facility] from home for upper abdominal pain. Pt states that he had 1 episode of diarrhea this morning and some nausea but no vomiting. Pt reports that on the way to the hospital pt stated having chest pain. Pt is in NAD.

## 2020-07-16 NOTE — ED Notes (Signed)
Patient had large emesis in waiting room

## 2020-07-16 NOTE — ED Triage Notes (Signed)
First RN Note: pt presents to ED via [facility] with c/o upper abdominal pain with approx 4hr onset. Per EMS pt with hx of a-fib. Per EMS pt with sudden onset substernal CP upon entering the hospital. EMS reports that patient's upper abdominal pain moved into his chest upon entering the hospital.   79-82 A-fib 145/81 97% RA

## 2020-07-17 ENCOUNTER — Encounter: Payer: Self-pay | Admitting: Radiology

## 2020-07-17 ENCOUNTER — Emergency Department: Payer: Medicare Other

## 2020-07-17 ENCOUNTER — Emergency Department
Admission: EM | Admit: 2020-07-17 | Discharge: 2020-07-17 | Disposition: A | Discharge status: Home / Self Care | Payer: Medicare Other | Source: Home / Self Care | Attending: Emergency Medicine | Admitting: Emergency Medicine

## 2020-07-17 DIAGNOSIS — R112 Nausea with vomiting, unspecified: Secondary | ICD-10-CM

## 2020-07-17 DIAGNOSIS — K8050 Calculus of bile duct without cholangitis or cholecystitis without obstruction: Secondary | ICD-10-CM

## 2020-07-17 DIAGNOSIS — R1011 Right upper quadrant pain: Secondary | ICD-10-CM

## 2020-07-17 LAB — TROPONIN I (HIGH SENSITIVITY): Troponin I (High Sensitivity): 4 ng/L (ref <18)

## 2020-07-17 MED ORDER — AMOXICILLIN-POT CLAVULANATE 875-125 MG PO TABS: 1.0000 | ORAL_TABLET | Freq: Two times a day (BID) | ORAL | 0 refills | Status: DC

## 2020-07-17 MED ORDER — TRAMADOL HCL 50 MG PO TABS: 50.0000 mg | ORAL_TABLET | Freq: Four times a day (QID) | ORAL | 0 refills | Status: DC | PRN

## 2020-07-17 MED ORDER — AMOXICILLIN-POT CLAVULANATE 875-125 MG PO TABS
1.0000 | ORAL_TABLET | Freq: Once | ORAL | Status: AC
  Administered 2020-07-17: 1 via ORAL
  Filled 2020-07-17: qty 1

## 2020-07-17 MED ORDER — TRAMADOL HCL 50 MG PO TABS
50.0000 mg | ORAL_TABLET | Freq: Once | ORAL | Status: AC
  Administered 2020-07-17: 50 mg via ORAL
  Filled 2020-07-17: qty 1

## 2020-07-17 MED ORDER — ONDANSETRON 4 MG PO TBDP
4.0000 mg | ORAL_TABLET | Freq: Once | ORAL | Status: AC
  Administered 2020-07-17: 4 mg via ORAL
  Filled 2020-07-17: qty 1

## 2020-07-17 MED ORDER — OXYCODONE-ACETAMINOPHEN 5-325 MG PO TABS: 1.0000 | ORAL_TABLET | ORAL | 0 refills | Status: DC | PRN

## 2020-07-17 MED ORDER — ONDANSETRON 4 MG PO TBDP: 4.0000 mg | ORAL_TABLET | Freq: Three times a day (TID) | ORAL | 0 refills | Status: DC | PRN

## 2020-07-17 MED ORDER — OXYCODONE-ACETAMINOPHEN 5-325 MG PO TABS
1.0000 | ORAL_TABLET | Freq: Once | ORAL | Status: DC
  Filled 2020-07-17: qty 1

## 2020-07-17 NOTE — ED Provider Notes (Signed)
[facility] Emergency Department Provider Note   ____________________________________________   First MD Initiated Contact with Patient 07/17/20 0448     (approximate)  I have reviewed the triage vital signs and the nursing notes.   HISTORY  Chief Complaint Abdominal Pain    HPI Stanley Marks is a 79 y.o. male brought to the ED via EMS from home with a chief complaint of upper abdominal pain.  Patient reports a 1 day history of upper abdominal pain associated with nausea and 1 episode of diarrhea.  During his long wait in the lobby, patient did vomit one time.  Denies fever, cough, shortness of breath, dysuria.  Initially also complained of chest pain which resolved during his wait.      Past Medical History:  Diagnosis Date   Basal cell carcinoma 08/24/2014   chin, L nose ant alar crease   Cancer (HCC)    skin   Emphysema of lung ([place])    Hypertension    PVD (peripheral vascular disease) ([place])    Squamous cell carcinoma of skin 09/07/2014, 03/23/2020   L dorsal hand   Squamous cell carcinoma of skin 01/17/2015   R prox lateral dorsum of hand    Patient Active Problem List   Diagnosis Date Noted   Obesity (BMI 35.0-39.9 without comorbidity)    PVD (peripheral vascular disease) ([place])    Wound of left leg    Acute respiratory failure due to COVID-19 ([place]) 12/22/2019   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) 01/28/2017   Bradycardia 01/28/2017   Acute and chronic respiratory failure with hypoxia ([place]) 01/26/2017   Obstructive sleep apnea syndrome 03/01/2016   Personal history of other malignant neoplasm of skin 12/08/2014    Past Surgical History:  Procedure Laterality Date   APPENDECTOMY     arm surgery     ELECTROPHYSIOLOGIC STUDY N/A 11/20/2016   Procedure: CARDIOVERSION;  Surgeon: Dionisio David, MD;  Location: [facility] ORS;  Service: Cardiovascular;  Laterality: N/A;    Prior to Admission medications   Medication Sig Start Date End Date Taking? Authorizing Provider  ADVAIR HFA 230-21 MCG/ACT inhaler Inhale 1 puff into the lungs 2 (two) times daily as needed.  12/17/19   [provider]  albuterol (VENTOLIN HFA) 108 (90 Base) MCG/ACT inhaler Inhale 2 puffs into the lungs every 6 (six) hours as needed for wheezing or shortness of breath. 12/21/19   Vanessa [place], MD  amiodarone (PACERONE) 200 MG tablet Take 2 tablets (400 mg total) by mouth 2 (two) times daily. 01/28/20   Loletha Grayer, MD  amoxicillin-clavulanate (AUGMENTIN) 875-125 MG tablet Take 1 tablet by mouth 2 (two) times daily for 10 days. 07/17/20 07/27/20  Paulette Blanch, MD  ascorbic acid (VITAMIN C) 500 MG tablet Take 1 tablet (500 mg total) by mouth daily. 01/29/20   Loletha Grayer, MD  digoxin (LANOXIN) 0.125 MG tablet Take 1 tablet (0.125 mg total) by mouth every other day. 01/28/20  Wiet[place], Richard, MD  ipratropium-albuterol (DUONEB) 0.5-2.5 (3) MG/3ML SOLN Inhale 3 mLs into the lungs every 6 (six) hours as needed. 01/01/20   [provider]  linezolid (ZYVOX) 600 MG tablet Take 600 mg by mouth 2 (two) times daily. 02/23/20   [provider]  metoprolol succinate (TOPROL-XL) 50 MG 24 hr tablet Take 1 tablet (50 mg total) by mouth in the morning and at bedtime. Take with or immediately following a meal. 01/28/20   Loletha Grayer, MD  Multiple Vitamin (MULTIVITAMIN WITH  MINERALS) TABS tablet Take 1 tablet by mouth daily. 12/22/19   Loletha Grayer, MD  nicotine (NICODERM CQ - DOSED IN MG/24 HOURS) 21 mg/24hr patch One patch chest wall daily (okay to substitute generic) 01/28/20   Leslye Peer, Richard, MD  ondansetron (ZOFRAN ODT) 4 MG disintegrating tablet Take 1 tablet (4 mg total) by mouth every 8 (eight) hours as needed for nausea or vomiting. 07/17/20   Paulette Blanch, MD  Rivaroxaban (XARELTO) 15 MG TABS tablet Take 15 mg by mouth daily with supper.    [provider]  sacubitril-valsartan (ENTRESTO) 49-51 MG Take 1 tablet by mouth 2 (two) times daily.    [provider]  torsemide (DEMADEX) 20 MG tablet Take 20 mg by mouth daily.    [provider]  traMADol (ULTRAM) 50 MG tablet Take 1 tablet (50 mg total) by mouth every 6 (six) hours as needed. 07/17/20   Paulette Blanch, MD  zinc sulfate 220 (50 Zn) MG capsule Take 1 capsule (220 mg total) by mouth daily. 12/22/19   Loletha Grayer, MD    Allergies Lipitor [atorvastatin calcium]  No family history on file.  Social History Social History   Tobacco Use   Smoking status: Current Every Day Smoker    Packs/day: 0.00    Types: Cigarettes   Smokeless tobacco: Never Used  Substance Use Topics   Alcohol use: Not Currently    Comment: quit 1979; 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   Drug use: Never    Review of Systems  Constitutional: No fever/chills Eyes: No visual changes. ENT: No sore throat. Cardiovascular: Denies chest pain. Respiratory: Denies shortness of breath. Gastrointestinal: Positive for abdominal pain, nausea and vomiting.  Positive for 1 episode of diarrhea.  No constipation. Genitourinary: Negative for dysuria. Musculoskeletal: Negative for back pain. Skin: Negative for rash. Neurological: Negative for headaches, focal weakness or numbness.   ____________________________________________   PHYSICAL EXAM:  VITAL SIGNS: ED Triage Vitals  Enc Vitals Group     BP  07/16/20 1813 132/85     Pulse Rate 07/16/20 1813 94     Resp 07/16/20 1813 16     Temp 07/16/20 1813 98.1 F (36.7 C)     Temp Source 07/16/20 1813 Oral     SpO2 07/16/20 1813 94 %     Weight 07/16/20 1810 290 lb (131.5 kg)     Height 07/16/20 1810 6\' 1"  (1.854 m)     Head Circumference --      Peak Flow --      Pain Score 07/16/20 1810 9     Pain Loc --      Pain Edu? --      Excl. in [place]? --     Constitutional: Alert and oriented. Well appearing and in no acute distress. Eyes: Conjunctivae are normal. PERRL. EOMI. Head: Atraumatic. Nose: No congestion/rhinnorhea. Mouth/Throat: Mucous membranes are moist.  Oropharynx non-erythematous. Neck: No stridor.   Cardiovascular: Normal rate, irregular  rhythm. Grossly normal heart sounds.  Good peripheral circulation. Respiratory: Normal respiratory effort.  No retractions. Lungs CTAB. Gastrointestinal: Soft and nontender to light or deep palpation. No distention. No abdominal bruits. No CVA tenderness. Musculoskeletal: No lower extremity tenderness nor edema.  No joint effusions. Neurologic:  Normal speech and language. No gross focal neurologic deficits are appreciated. No gait instability. Skin:  Skin is warm, dry and intact. No rash noted. Psychiatric: Mood and affect are normal. Speech and behavior are normal.  ____________________________________________   LABS (all labs ordered are listed, but only abnormal results are displayed)  Labs Reviewed  COMPREHENSIVE METABOLIC PANEL - Abnormal; Notable for the following components:      Result Value   Glucose, Bld 147 (*)    Creatinine, Ser 1.42 (*)    AST 12 (*)    GFR calc non Af Amer 48 (*)    GFR calc Af Amer 55 (*)    All other components within normal limits  CBC - Abnormal; Notable for the following components:   WBC 14.2 (*)    Hemoglobin 17.1 (*)    RDW 16.5 (*)    All other components within normal limits  LIPASE, BLOOD  URINALYSIS, COMPLETE (UACMP) WITH MICROSCOPIC   TROPONIN I (HIGH SENSITIVITY)  TROPONIN I (HIGH SENSITIVITY)   ____________________________________________  EKG  ED ECG REPORT I, Jadyn Brasher J, the attending physician, personally viewed and interpreted this ECG.   Date: 07/17/2020  EKG Time: 1802  Rate: 93  Rhythm: atrial fibrillation, rate 93  Axis: Normal  Intervals:none  ST&T Change: Nonspecific  ____________________________________________  RADIOLOGY  ED MD interpretation: No acute cardiopulmonary process; cholelithiasis with gallbladder wall thickening and pericholecystic fluid, negative Murphy sign  Official radiology report(s): DG Chest 2 View  Result Date: 07/17/2020 CLINICAL DATA:  Chest pain and upper abdominal pain. Diarrhea and nausea. EXAM: CHEST - 2 VIEW COMPARISON:  01/22/2020 FINDINGS: Mild cardiac enlargement. Pulmonary vascularity is normal. Mild peribronchial thickening and central interstitial pattern similar to prior study and likely representing chronic bronchitis. No airspace disease or consolidation. No pleural effusions. No pneumothorax. Mediastinal contours appear intact. Calcification of the aorta. Degenerative changes in the spine and shoulders. IMPRESSION: Mild cardiac enlargement. No evidence of active pulmonary disease. Electronically Signed   By: Lucienne Capers M.D.   On: 07/17/2020 03:50   US ABDOMEN LIMITED RUQ  Result Date: 07/17/2020 CLINICAL DATA:  Right upper quadrant pain. EXAM: ULTRASOUND ABDOMEN LIMITED RIGHT UPPER QUADRANT COMPARISON:  None. FINDINGS: Gallbladder: Mobile gallstone demonstrated in the gallbladder measuring about 8 mm diameter. There is diffuse thickening of the gallbladder wall with mild pericholecystic fluid. Murphy's sign is negative. Common bile duct: Diameter: 6.7 mm, normal Liver: Diffusely increased and heterogeneous hepatic parenchymal echotexture, likely representing fatty infiltration. Portal vein is patent on color Doppler imaging with normal direction of blood flow  towards the liver. Other: None. IMPRESSION: Cholelithiasis with gallbladder wall thickening and pericholecystic fluid. Negative Murphy's sign. Changes are nonspecific but may indicate acute cholecystitis in the appropriate clinical setting. Diffuse fatty infiltration of the liver. Electronically Signed   By: Lucienne Capers M.D.   On: 07/17/2020 01:54    ____________________________________________   PROCEDURES  Procedure(s) performed (including Critical Care):  Procedures   ____________________________________________   INITIAL IMPRESSION / ASSESSMENT AND PLAN / ED COURSE  As part of my medical decision making, I reviewed the following data within the [place] notes reviewed and incorporated, Labs reviewed, EKG interpreted, Old chart reviewed, Radiograph reviewed,  Notes from prior ED visits and [place] Controlled Substance Database     Stanley Marks was evaluated in Emergency Department on 07/17/2020 for the symptoms described in the history of present illness. He was evaluated in the context of the global COVID-19 pandemic, which necessitated consideration that the patient might be at risk for infection with the SARS-CoV-2 virus that causes COVID-19. Institutional protocols and algorithms that pertain to the evaluation of patients at risk for COVID-19 are in a state of rapid change based on information released by regulatory bodies including the CDC and federal and state organizations. These policies and algorithms were followed during the patient's care in the ED.    78 year old male presenting with upper abdominal pain. Differential diagnosis includes, but is not limited to, biliary disease (biliary colic, acute cholecystitis, cholangitis, choledocholithiasis, etc), intrathoracic causes for epigastric abdominal pain including ACS, gastritis, duodenitis, pancreatitis, small bowel or large bowel obstruction, abdominal aortic aneurysm, hernia, and ulcer(s).  Laboratory  results demonstrate moderate leukocytosis, AKI.  Ultrasound demonstrates cholelithiasis with questionable findings for cholecystitis.  Currently patient denies abdominal pain or nausea.  Due to his long wait for treatment room, patient declines IV fluids and is eager for discharge home.  Will discuss case with surgery.   Clinical Course as of Jul 17 526  Sun Jul 17, 2020  0501 Discussed case with Dr. Hampton Abbot from general surgery who reviewed patient's images.  Recommends placing patient on Augmentin x 10 days as a precaution.  Surgery office will call the patient with an appointment next week.  I have relayed this to the patient.  Strict return precautions given.  Patient verbalizes understanding and agrees with plan of care.   [JS]  [id number] Patient states he cannot take Percocet but can take tramadol.   [JS]    Clinical Course User Index [JS] Paulette Blanch, MD     ____________________________________________   FINAL CLINICAL IMPRESSION(S) / ED DIAGNOSES  Final diagnoses:  Right upper quadrant abdominal pain  Biliary colic  Non-intractable vomiting with nausea, unspecified vomiting type     ED Discharge Orders         Ordered    amoxicillin-clavulanate (AUGMENTIN) 875-125 MG tablet  2 times daily,   Status:  Discontinued     Reprint     07/17/20 0506    oxyCODONE-acetaminophen (PERCOCET/ROXICET) 5-325 MG tablet  Every 4 hours PRN,   Status:  Discontinued     Reprint     07/17/20 0506    ondansetron (ZOFRAN ODT) 4 MG disintegrating tablet  Every 8 hours PRN     Discontinue  Reprint     07/17/20 0506    amoxicillin-clavulanate (AUGMENTIN) 875-125 MG tablet  2 times daily     Discontinue  Reprint     07/17/20 0527    traMADol (ULTRAM) 50 MG tablet  Every 6 hours PRN     Discontinue  Reprint     07/17/20 0527           Note:  This document was prepared using Dragon voice recognition software and may include unintentional dictation errors.   Paulette Blanch, MD 07/17/20 [phone]

## 2020-07-17 NOTE — Discharge Instructions (Addendum)
1. Take medicines as needed for pain & nausea (Tramadol/Zofran #30). 2. Clear liquids x 12 hours, then bland diet x 1 week, then slowly advance diet as tolerated. Avoid fatty, greasy, spicy foods and drinks. 3.  Take antibiotic as prescribed (Augmentin 875 mg twice daily x10 days). 4. Return to the ER for worsening symptoms, persistent vomiting, fever, difficulty breathing or other concerns.

## 2020-07-17 NOTE — ED Notes (Signed)
Reviewed discharge instructions, follow-up care, and prescriptions with patient. Patient verbalized understanding of all information reviewed. Patient stable, with no distress noted at this time.    

## 2020-07-17 NOTE — ED Notes (Signed)
Patient transported to Ultrasound 

## 2020-07-18 ENCOUNTER — Telehealth: Payer: Self-pay

## 2020-07-20 ENCOUNTER — Other Ambulatory Visit: Payer: Self-pay

## 2020-07-20 ENCOUNTER — Emergency Department: Payer: Medicare Other

## 2020-07-20 ENCOUNTER — Ambulatory Visit: Payer: Medicare Other | Admitting: Surgery

## 2020-07-20 ENCOUNTER — Inpatient Hospital Stay
Admission: EM | Admit: 2020-07-20 | Discharge: 2020-07-24 | DRG: 872 | Disposition: A | Discharge status: Home Health | Payer: Medicare Other | Attending: Internal Medicine | Admitting: Internal Medicine

## 2020-07-20 DIAGNOSIS — J9611 Chronic respiratory failure with hypoxia: Secondary | ICD-10-CM | POA: Diagnosis present

## 2020-07-20 DIAGNOSIS — I110 Hypertensive heart disease with heart failure: Secondary | ICD-10-CM | POA: Diagnosis present

## 2020-07-20 DIAGNOSIS — I4891 Unspecified atrial fibrillation: Secondary | ICD-10-CM | POA: Diagnosis present

## 2020-07-20 DIAGNOSIS — E669 Obesity, unspecified: Secondary | ICD-10-CM | POA: Diagnosis present

## 2020-07-20 DIAGNOSIS — K819 Cholecystitis, unspecified: Secondary | ICD-10-CM

## 2020-07-20 DIAGNOSIS — J449 Chronic obstructive pulmonary disease, unspecified: Secondary | ICD-10-CM | POA: Diagnosis present

## 2020-07-20 DIAGNOSIS — Z9049 Acquired absence of other specified parts of digestive tract: Secondary | ICD-10-CM | POA: Diagnosis not present

## 2020-07-20 DIAGNOSIS — A419 Sepsis, unspecified organism: Secondary | ICD-10-CM | POA: Diagnosis not present

## 2020-07-20 DIAGNOSIS — I482 Chronic atrial fibrillation, unspecified: Secondary | ICD-10-CM | POA: Diagnosis present

## 2020-07-20 DIAGNOSIS — Z8616 Personal history of COVID-19: Secondary | ICD-10-CM | POA: Diagnosis not present

## 2020-07-20 DIAGNOSIS — K8000 Calculus of gallbladder with acute cholecystitis without obstruction: Secondary | ICD-10-CM | POA: Diagnosis present

## 2020-07-20 DIAGNOSIS — G4733 Obstructive sleep apnea (adult) (pediatric): Secondary | ICD-10-CM | POA: Diagnosis present

## 2020-07-20 DIAGNOSIS — Z7951 Long term (current) use of inhaled steroids: Secondary | ICD-10-CM | POA: Diagnosis not present

## 2020-07-20 DIAGNOSIS — Z85828 Personal history of other malignant neoplasm of skin: Secondary | ICD-10-CM | POA: Diagnosis not present

## 2020-07-20 DIAGNOSIS — R109 Unspecified abdominal pain: Secondary | ICD-10-CM

## 2020-07-20 DIAGNOSIS — R6520 Severe sepsis without septic shock: Secondary | ICD-10-CM | POA: Diagnosis present

## 2020-07-20 DIAGNOSIS — Z79899 Other long term (current) drug therapy: Secondary | ICD-10-CM

## 2020-07-20 DIAGNOSIS — J439 Emphysema, unspecified: Secondary | ICD-10-CM | POA: Diagnosis present

## 2020-07-20 DIAGNOSIS — F17210 Nicotine dependence, cigarettes, uncomplicated: Secondary | ICD-10-CM | POA: Diagnosis present

## 2020-07-20 DIAGNOSIS — N179 Acute kidney failure, unspecified: Secondary | ICD-10-CM | POA: Diagnosis present

## 2020-07-20 DIAGNOSIS — J4489 Other specified chronic obstructive pulmonary disease: Secondary | ICD-10-CM | POA: Diagnosis present

## 2020-07-20 DIAGNOSIS — K8020 Calculus of gallbladder without cholecystitis without obstruction: Secondary | ICD-10-CM

## 2020-07-20 DIAGNOSIS — A4151 Sepsis due to Escherichia coli [E. coli]: Secondary | ICD-10-CM | POA: Diagnosis present

## 2020-07-20 DIAGNOSIS — I5022 Chronic systolic (congestive) heart failure: Secondary | ICD-10-CM | POA: Diagnosis present

## 2020-07-20 DIAGNOSIS — Z9119 Patient's noncompliance with other medical treatment and regimen: Secondary | ICD-10-CM

## 2020-07-20 DIAGNOSIS — Z6839 Body mass index (BMI) 39.0-39.9, adult: Secondary | ICD-10-CM

## 2020-07-20 DIAGNOSIS — Z7901 Long term (current) use of anticoagulants: Secondary | ICD-10-CM

## 2020-07-20 DIAGNOSIS — I739 Peripheral vascular disease, unspecified: Secondary | ICD-10-CM | POA: Diagnosis present

## 2020-07-20 DIAGNOSIS — R1011 Right upper quadrant pain: Secondary | ICD-10-CM

## 2020-07-20 HISTORY — DX: Calculus of gallbladder with acute cholecystitis without obstruction: K80.00

## 2020-07-20 LAB — BASIC METABOLIC PANEL
Anion gap: 9 (ref 5–15)
BUN: 17 mg/dL (ref 8–23)
CO2: 25 mmol/L (ref 22–32)
Calcium: 9.2 mg/dL (ref 8.9–10.3)
Chloride: 101 mmol/L (ref 98–111)
Creatinine, Ser: 1.31 mg/dL — ABNORMAL HIGH (ref 0.61–1.24)
GFR calc Af Amer: >60 mL/min (ref >60)
GFR calc non Af Amer: 53 mL/min — ABNORMAL LOW (ref >60)
Glucose, Bld: 114 mg/dL — ABNORMAL HIGH (ref 70–99)
Potassium: 4.6 mmol/L (ref 3.5–5.1)
Sodium: 135 mmol/L (ref 135–145)

## 2020-07-20 LAB — CBC
HCT: 43.9 % (ref 39.0–52.0)
Hemoglobin: 14 g/dL (ref 13.0–17.0)
MCH: 30.2 pg (ref 26.0–34.0)
MCHC: 31.9 g/dL (ref 30.0–36.0)
MCV: 94.6 fL (ref 80.0–100.0)
Platelets: 202 10*3/uL (ref 150–400)
RBC: 4.64 MIL/uL (ref 4.22–5.81)
RDW: 17.2 % — ABNORMAL HIGH (ref 11.5–15.5)
WBC: 20.7 10*3/uL — ABNORMAL HIGH (ref 4.0–10.5)
nRBC: 0 % (ref 0.0–0.2)

## 2020-07-20 LAB — SARS CORONAVIRUS 2 BY RT PCR (HOSPITAL ORDER, PERFORMED IN ~~LOC~~ HOSPITAL LAB): SARS Coronavirus 2: NEGATIVE

## 2020-07-20 MED ORDER — SODIUM CHLORIDE 0.9 % IV SOLN: INTRAVENOUS | Status: AC

## 2020-07-20 MED ORDER — MORPHINE SULFATE (PF) 2 MG/ML IV SOLN
1.0000 mg | INTRAVENOUS | Status: DC | PRN
  Administered 2020-07-21 – 2020-07-23 (×5): 1 mg via INTRAVENOUS
  Filled 2020-07-20 (×5): qty 1

## 2020-07-20 MED ORDER — SODIUM CHLORIDE 0.9 % IV SOLN: 1.0000 g | INTRAVENOUS | Status: DC

## 2020-07-20 MED ORDER — SODIUM CHLORIDE 0.9 % IV SOLN: 1.0000 g | Freq: Once | INTRAVENOUS | Status: DC

## 2020-07-20 MED ORDER — SODIUM CHLORIDE 0.9 % IV BOLUS
1000.0000 mL | Freq: Once | INTRAVENOUS | Status: AC
  Administered 2020-07-20: 1000 mL via INTRAVENOUS

## 2020-07-20 MED ORDER — SODIUM CHLORIDE 0.9 % IV SOLN
2.0000 g | INTRAVENOUS | Status: DC
  Administered 2020-07-21 – 2020-07-23 (×3): 2 g via INTRAVENOUS
  Filled 2020-07-20: qty 20
  Filled 2020-07-20 (×3): qty 2

## 2020-07-20 MED ORDER — SODIUM CHLORIDE 0.9 % IV SOLN
2.0000 g | Freq: Once | INTRAVENOUS | Status: AC
  Administered 2020-07-20: 2 g via INTRAVENOUS
  Filled 2020-07-20: qty 20

## 2020-07-20 MED ORDER — METRONIDAZOLE IN NACL 5-0.79 MG/ML-% IV SOLN
500.0000 mg | Freq: Three times a day (TID) | INTRAVENOUS | Status: DC
  Administered 2020-07-20 – 2020-07-24 (×11): 500 mg via INTRAVENOUS
  Filled 2020-07-20 (×16): qty 100

## 2020-07-20 NOTE — Progress Notes (Signed)
Briefly, this is a 79 y/o M who presented to the ED today with a CC of weakness. He had just been seen a few days ago, 07/17/20, for a CC of abdominal pain. He was diagnosed with cholelithiasis, with US findings suggestive of possible cholecystitis, but his pain resolved and he declined further intervention at the hospital. He was sent home on a course of oral antibiotics and was supposed to be seen in clinic by Dr. Hampton Abbot today, but missed that appointment due to feeling poorly and coming to the ED.  On evaluation today, he had had an increase in his WBC and was hypotensive and tachycardic. He denied abdominal pain.  A RUQ [place] was repeated and appeared essentially unchanged.  General surgery was consulted, but due to the patient's extensive medical comorbidities and the >72 hours since first presentation, he was admitted to the hospitalist service for conservative treatment. Full consult note to follow.

## 2020-07-20 NOTE — H&P (Signed)
History and Physical    Stanley Marks FUX:[id number] DOB: August 04, 1941 DOA: 07/20/2020  PCP: Perrin Maltese, MD  Patient coming from: Home  I have personally briefly reviewed patient's old medical records in [place]  Chief Complaint: Weakness and fall  HPI: Stanley Marks is a 79 y.o. male with medical history significant for chronic systolic CHF, atrial fibrillation on Xarelto, hypertension, PVD, COPD, OSA who presents with concerns of weakness and fall.  Patient was last seen in the ED on 8/8 and had moderate leukocytosis, AKI and ultrasound demonstrating cholelithiasis with questionable finding for cholecystitis.  At that time, patient was eager to go home and was discharged.  Case was discussed with general surgeon who recommended outpatient follow-up and starting Augmentin. Patient has been taking antibiotics.  Since his ED visit, patient has felt weak and has only been able to eat broth.  He originally had outpatient follow-up with surgery today but did not go due to his weakness and fall.  He denies any lightheadedness, dizziness or loss of consciousness prior to his fall.  States it was just due to his generalized weakness that he could not get up until paramedics arrived.  He denies any fever.  Denies any active nausea or vomiting.  No diarrhea.  Denies any abdominal pain unless palpated.  ED Course: Patient was very fatigued appearing.  He was afebrile, had atrial fibrillation with RVR but still rate control around 100.  He was placed on 2 L of O2 without any significant hypoxia.  Patient reports that he does not have his own oxygen at home but some times with use one of his family members as needed.  He had a leukocytosis of 20.7 which is elevated from prior of 14.2 at his last ED visit.  Creatinine remains elevated at 1.31.  Abdominal ultrasound again shows findings equivocal for acute cholecystitis.  CT head was obtained due to fall and showed focal hyperdensity in the  basilar artery that could be concerning for thrombosis.  However patient had no neurological exams on finding and has been compliant with his Xarelto.  Dr. Archie Balboa discussed with Dr. Celine Ahr of surgery and patient is likely not a surgical candidate and likely will require percutaneous drain.  Review of Systems:  Constitutional: No Weight Change, No Fever ENT/Mouth: No sore throat, No Rhinorrhea Eyes: No Eye Pain, No Vision Changes Cardiovascular: No Chest Pain, no SOB Respiratory: No Cough, No Sputum, No Wheezing, no Dyspnea  Gastrointestinal: No Nausea, No Vomiting, No Diarrhea, No Constipation, No Pain Genitourinary: no Urinary Incontinence, No Urgency Musculoskeletal: No Arthralgias, No Myalgias Skin: No Skin Lesions, No Pruritus, Neuro: + Weakness, No Numbness,  No Loss of Consciousness, No Syncope Psych: No Anxiety/Panic, No Depression, + decrease appetite Heme/Lymph: No Bruising, No Bleeding  Past Medical History:  Diagnosis Date  . Basal cell carcinoma 08/24/2014   chin, L nose ant alar crease  . Cancer ([place])    skin  . Emphysema of lung ([place])   . Hypertension   . PVD (peripheral vascular disease) ([place])   . 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 APPENDECTOMY    . arm surgery    . ELECTROPHYSIOLOGIC STUDY N/A 11/20/2016   Procedure: CARDIOVERSION;  Surgeon: Dionisio David, MD;  Location: [facility] ORS;  Service: Cardiovascular;  Laterality: N/A;  reports that he has been smoking cigarettes. He has been smoking about 0.00 packs per day. He has never used smokeless tobacco. He reports previous alcohol use. He reports that he does not use drugs. Social History  Allergies  Allergen Reactions  . Lipitor [Atorvastatin Calcium] Other (See Comments)    arthralgia    History reviewed. No pertinent family  history.   Prior to Admission medications   Medication Sig Start Date End Date Taking? Authorizing Provider  amoxicillin-clavulanate (AUGMENTIN) 875-125 MG tablet Take 1 tablet by mouth 2 (two) times daily for 10 days. 07/17/20 07/27/20 Yes Paulette Blanch, MD  ondansetron (ZOFRAN ODT) 4 MG disintegrating tablet Take 1 tablet (4 mg total) by mouth every 8 (eight) hours as needed for nausea or vomiting. 07/17/20  Yes Paulette Blanch, MD  torsemide (DEMADEX) 20 MG tablet Take 20 mg by mouth daily.   Yes [provider]  traMADol (ULTRAM) 50 MG tablet Take 1 tablet (50 mg total) by mouth every 6 (six) hours as needed. 07/17/20  Yes Paulette Blanch, MD  ADVAIR [facility] 230-21 MCG/ACT inhaler Inhale 1 puff into the lungs 2 (two) times daily as needed.  12/17/19   [provider]  albuterol (VENTOLIN HFA) 108 (90 Base) MCG/ACT inhaler Inhale 2 puffs into the lungs every 6 (six) hours as needed for wheezing or shortness of breath. 12/21/19   Vanessa [place], MD  amiodarone (PACERONE) 200 MG tablet Take 2 tablets (400 mg total) by mouth 2 (two) times daily. 01/28/20   Loletha Grayer, MD  ascorbic acid (VITAMIN C) 500 MG tablet Take 1 tablet (500 mg total) by mouth daily. 01/29/20   Loletha Grayer, MD  digoxin (LANOXIN) 0.125 MG tablet Take 1 tablet (0.125 mg total) by mouth every other day. 01/28/20   Loletha Grayer, MD  ipratropium-albuterol (DUONEB) 0.5-2.5 (3) MG/3ML SOLN Inhale 3 mLs into the lungs every 6 (six) hours as needed. 01/01/20   [provider]  linezolid (ZYVOX) 600 MG tablet Take 600 mg by mouth 2 (two) times daily. 02/23/20   [provider]  metoprolol succinate (TOPROL-XL) 50 MG 24 hr tablet Take 1 tablet (50 mg total) by mouth in the morning and at bedtime. Take with or immediately following a meal. 01/28/20   Loletha Grayer, MD  Multiple Vitamin (MULTIVITAMIN WITH MINERALS) TABS tablet Take 1 tablet by mouth daily. 12/22/19   Loletha Grayer, MD  nicotine (NICODERM CQ -  DOSED IN MG/24 HOURS) 21 mg/24hr patch One patch chest wall daily (okay to substitute generic) 01/28/20   Loletha Grayer, MD  Rivaroxaban (XARELTO) 15 MG TABS tablet Take 15 mg by mouth daily with supper.    [provider]  sacubitril-valsartan (ENTRESTO) 49-51 MG Take 1 tablet by mouth 2 (two) times daily.    [provider]  zinc sulfate 220 (50 Zn) MG capsule Take 1 capsule (220 mg total) by mouth daily. 12/22/19   Loletha Grayer, MD    Physical Exam: Vitals:   07/20/20 1509 07/20/20 1516 07/20/20 1527 07/20/20 1900  BP:   114/61 111/64  Pulse:   (!) 105 (!) 105  Resp:   (!) 24 (!) 25  Temp:  98.6 F (37 C)    TempSrc:  Oral    SpO2:   95% 94%  Weight: 131 kg     Height: 6\' 1"  (1.854 m)       Constitutional: NAD, calm, comfortable, fatigue and diaphoretic appearing male asleep in bed Vitals:  07/20/20 1509 07/20/20 1516 07/20/20 1527 07/20/20 1900  BP:   114/61 111/64  Pulse:   (!) 105 (!) 105  Resp:   (!) 24 (!) 25  Temp:  98.6 F (37 C)    TempSrc:  Oral    SpO2:   95% 94%  Weight: 131 kg     Height: 6\' 1"  (1.854 m)      Eyes: PERRL, lids and conjunctivae normal ENMT: Mucous membranes are moist.  Neck: normal, supple,  Respiratory: clear to auscultation bilaterally, no wheezing, no crackles. Normal respiratory effort on 2 L via nasal cannula. No accessory muscle use.  Cardiovascular: Irregularly irregular rhythm, no murmurs / rubs / gallops. No extremity edema.  Abdomen: Mild right upper quadrant tenderness with palpation, no masses palpated. . Bowel sounds positive.  Musculoskeletal: no clubbing / cyanosis. No joint deformity upper and lower extremities. Good ROM, no contractures. Normal muscle tone.  Skin: no rashes, lesions, ulcers. No induration Neurologic: CN 2-12 grossly intact. Sensation intact,Strength 5/5 in all 4.  Psychiatric: Normal judgment and insight. Alert and oriented x 3. Normal mood.     Labs on Admission: I have personally  reviewed following labs and imaging studies  CBC: Recent Labs  Lab 07/16/20 1820 07/20/20 1515  WBC 14.2* 20.7*  HGB 17.1* 14.0  HCT 51.8 43.9  MCV 91.4 94.6  PLT 228 741   Basic Metabolic Panel: Recent Labs  Lab 07/16/20 1820 07/20/20 1515  NA 142 135  K 4.5 4.6  CL 102 101  CO2 29 25  GLUCOSE 147* 114*  BUN 16 17  CREATININE 1.42* 1.31*  CALCIUM 10.2 9.2   GFR: Estimated Creatinine Clearance: 68.1 mL/min (A) (by C-G formula based on SCr of 1.31 mg/dL (H)). Liver Function Tests: Recent Labs  Lab 07/16/20 1820  AST 12*  ALT 17  ALKPHOS 72  BILITOT 1.0  PROT 7.8  ALBUMIN 3.8   Recent Labs  Lab 07/16/20 1820  LIPASE 34   No results for input(s): AMMONIA in the last 168 hours. Coagulation Profile: No results for input(s): INR, PROTIME in the last 168 hours. Cardiac Enzymes: No results for input(s): CKTOTAL, CKMB, CKMBINDEX, TROPONINI in the last 168 hours. BNP (last 3 results) No results for input(s): PROBNP in the last 8760 hours. HbA1C: No results for input(s): HGBA1C in the last 72 hours. CBG: No results for input(s): GLUCAP in the last 168 hours. Lipid Profile: No results for input(s): CHOL, HDL, LDLCALC, TRIG, CHOLHDL, LDLDIRECT in the last 72 hours. Thyroid Function Tests: No results for input(s): TSH, T4TOTAL, FREET4, T3FREE, THYROIDAB in the last 72 hours. Anemia Panel: No results for input(s): VITAMINB12, FOLATE, FERRITIN, TIBC, IRON, RETICCTPCT in the last 72 hours. Urine analysis:    Component Value Date/Time   COLORURINE YELLOW (A) 01/24/2020 1214   APPEARANCEUR CLEAR (A) 01/24/2020 1214   LABSPEC 1.008 01/24/2020 1214   PHURINE 5.0 01/24/2020 [place] 01/24/2020 1214   HGBUR NEGATIVE 01/24/2020 1214   [place] 01/24/2020 1214   [place] 01/24/2020 1214   PROTEINUR NEGATIVE 01/24/2020 1214   NITRITE NEGATIVE 01/24/2020 1214   LEUKOCYTESUR NEGATIVE 01/24/2020 1214    Radiological Exams on  Admission: CT HEAD WO CONTRAST  Addendum Date: 07/20/2020   ADDENDUM REPORT: 07/20/2020 16:22 ADDENDUM: These results were called by telephone at the time of interpretation on 07/20/2020 at 4:21 pm to provider Nance Pear , who verbally acknowledged these results. Electronically Signed   By: Fidela Salisbury MD  On: 07/20/2020 16:22   Result Date: 07/20/2020 CLINICAL DATA:  Head injury, altered mental status EXAM: CT HEAD WITHOUT CONTRAST TECHNIQUE: Contiguous axial images were obtained from the base of the skull through the vertex without intravenous contrast. COMPARISON:  None. FINDINGS: Brain: There is normal anatomic configuration of the brain. Mild periventricular white matter changes are present likely reflecting the sequela of small vessel ischemia. Mild parenchymal volume loss is commensurate with the patient's age. 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 No evidence of acute intracranial hemorrhage or infarct. No abnormal mass effect or midline shift. No abnormal intra or extra-axial mass lesion or fluid collection. Ventricular size is normal. The cerebellum is unremarkable. Vascular: There is focal hyperdensity identified involving the basilar artery at its confluence, best seen on axial image # 8/2, coronal image # 36/4, and sagittal image # 30/5. While unusual in this location, focal hyperdense clot is difficult to exclude. Asymmetric atherosclerotic calcification could result in a similar appearance. Skull: Intact Sinuses/Orbits: Bilateral maxillary antrostomy has been performed. Mild mucosal thickening is noted within the ethmoid air cells bilaterally. Remaining paranasal sinuses are clear. Orbits are unremarkable. Other: Mastoid air cells and middle ear cavities are clear. IMPRESSION: 1. Focal hyperdensity involving the basilar artery at its confluence. While unusual in this location, focal hyperdense clot is  difficult to exclude. Asymmetric atherosclerotic calcification could result in a similar appearance. Correlation with neurologic examination is recommended. If there is concern for basilar artery thrombosis, CT arteriography is recommended for further evaluation. 2. 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 3. Mild periventricular white matter changes likely reflecting the sequela of small vessel ischemia. Electronically Signed: By: Fidela Salisbury MD On: 07/20/2020 16:14   US Abdomen Limited RUQ  Result Date: 07/20/2020 CLINICAL DATA:  Abdominal pain times 3-4 days. EXAM: ULTRASOUND ABDOMEN LIMITED RIGHT UPPER QUADRANT COMPARISON:  07/17/2020 FINDINGS: Gallbladder: There is gallbladder wall thickening with the gallbladder wall measuring up to approximately 6 mm. The sonographic Percell Miller sign is negative. There are gallstones. The gallbladder appears to be distended. Common bile duct: Diameter: 5 mm Liver: Diffuse increased echogenicity with slightly heterogeneous liver. Appearance typically secondary to fatty infiltration. Fibrosis secondary consideration. No secondary findings of cirrhosis noted. No focal hepatic lesion or intrahepatic biliary duct dilatation. Portal vein is patent on color Doppler imaging with normal direction of blood flow towards the liver. Other: None. IMPRESSION: 1. Findings are again equivocal for acute cholecystitis. If there is high clinical suspicion, follow-up with HIDA scan is recommended. 2. Hepatic steatosis. Electronically Signed   By: Constance Holster M.D.   On: 07/20/2020 17:48      Assessment/Plan  Sepsis secondary to acute cholecystitis Patient qualifies for sepsis due to tachycardia, leukocytosis and source of infection. Ultrasound equivocal for acute cholecystitis but patient has worsening leukocytosis since last ED visit Continue IV Rocephin and Flagyl Dr. Archie Balboa discussed case with general  surgery and patient likely is not a good candidate for surgery and will need percutaneous drainage.  However they will see in consultation tomorrow. 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 Consider imaging only if patient has any significant changes in neurological findings  AKI Continuous IV fluids.  Avoid nephrotoxic agent.  Atrial fibrillation Patient is  tachycardic but rate control Hold Xarelto for potential procedure tomorrow Continue amiodarone, digoxin and metoprolol  COPD Not in acute exacerbation.  Unsure why patient was placed on O2 since he did not have any significant hypoxia documented. Wean as tolerated  Chronic systolic CHF Patient appears euvolemic on exam Hold Entresto for now due to AKI  DVT prophylaxis: SCDs Code Status: Full Family Communication: Plan discussed with patient at bedside  disposition Plan: Home with at least 2 midnight stays  Consults called:  Admission status: inpatient Status is: Inpatient  Remains inpatient appropriate because:Inpatient level of care appropriate due to severity of illness   Dispo: The patient is from: Home              Anticipated d/c is to: Home              Anticipated d/c date is: 2 days              Patient currently is not medically stable to d/c.         Orene Desanctis DO [facility]   If 7PM-7AM, please contact night-coverage [email or web address]   07/20/2020, 9:00 PM

## 2020-07-20 NOTE — ED Triage Notes (Signed)
First nurse note-   Was supposed to go to [facility] today at 1400 and had fall X 2 today so came here instead for weakness.  Was supposed to see MD for gallstones.  VSS with EMS 100/60.  CBG 126.

## 2020-07-20 NOTE — ED Triage Notes (Signed)
Pt comes EMS after missing his gallstone appointment because he fell this morning. BP 85/51 and tachy at 125. Slow in responses but AOx.4 Unsure why he fell except states weakness/dizziness.

## 2020-07-20 NOTE — Telephone Encounter (Signed)
See note

## 2020-07-20 NOTE — ED Provider Notes (Signed)
[facility] Emergency Department Provider Note   ____________________________________________   I have reviewed the triage vital signs and the nursing notes.   HISTORY  Chief Complaint Fall and Abdominal Pain   History limited by: Not Limited   HPI Stanley Marks is a 79 y.o. male who presents to the emergency department today because of concern for weakness. The patient has felt progressively weak over the past few days. Was seen in the emergency department three days ago and diagnosed with gallstones. Was scheduled to follow up with surgery today but was unable to make the appointment because of weakness. He denies any significant pain but does state that his appetite has been decreased. Has not appreciated any fevers.    Records reviewed. Per medical record review patient has a history of recent ER visit, diagnosed with gallstones.   Past Medical History:  Diagnosis Date  . Basal cell carcinoma 08/24/2014   chin, L nose ant alar crease  . Cancer ([place])    skin  . Emphysema of lung ([place])   . Hypertension   . PVD (peripheral vascular disease) ([place])   . 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 Squamous cell carcinoma of skin 01/17/2015   R prox lateral dorsum of hand    Patient Active Problem List   Diagnosis Date Noted  . Obesity (BMI 35.0-39.9 without comorbidity)   . PVD (peripheral vascular disease) ([place])   . Wound of left leg   . Acute respiratory failure due to COVID-19 ([place]) 12/22/2019  . 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 Atrial fibrillation, chronic ([place]) 12/22/2019  . Acute respiratory failure with hypoxia ([place]) 12/22/2019  . Pneumonia due to COVID-19 virus 12/22/2019  . Atrial fibrillation with RVR ([place])   . Essential hypertension   . Influenza A 01/28/2017  . Pneumonia 01/28/2017  . COPD exacerbation ([place]) 01/28/2017  . Tobacco abuse counseling 01/28/2017  . Patient's noncompliance  with other medical treatment and regimen 01/28/2017  . Emphysema of lung ([place]) 01/28/2017  . Leukocytosis 01/28/2017  . Chronic systolic CHF (congestive heart failure) ([place]) 01/28/2017  . Bradycardia 01/28/2017  . Acute and chronic respiratory failure with hypoxia ([place]) 01/26/2017  . Obstructive sleep apnea syndrome 03/01/2016  . Personal history of other malignant neoplasm of skin 12/08/2014    Past Surgical History:  Procedure Laterality Date  . APPENDECTOMY    . arm surgery    . ELECTROPHYSIOLOGIC STUDY N/A 11/20/2016   Procedure: CARDIOVERSION;  Surgeon: Dionisio David, MD;  Location: [facility] ORS;  Service: Cardiovascular;  Laterality: N/A;    Prior to Admission medications   Medication Sig Start Date End Date Taking? Authorizing Provider  ADVAIR HFA 230-21 MCG/ACT inhaler Inhale 1 puff into the lungs 2 (two) times daily as needed.  12/17/19   [provider]  albuterol (VENTOLIN HFA) 108 (90 Base) MCG/ACT inhaler Inhale 2 puffs into the lungs every 6 (six) hours as needed for wheezing or shortness of breath. 12/21/19   Vanessa Ovando, MD  amiodarone (PACERONE) 200 MG tablet Take 2 tablets (400 mg total) by mouth 2 (two) times daily. 01/28/20   Loletha Grayer, MD  amoxicillin-clavulanate (AUGMENTIN) 875-125 MG tablet Take 1 tablet by mouth 2 (two) times daily for 10 days. 07/17/20 07/27/20  Paulette Blanch, MD  ascorbic acid (VITAMIN C) 500 MG tablet Take 1 tablet (500 mg total) by mouth daily. 01/29/20   Loletha Grayer, MD  digoxin (LANOXIN) 0.125  MG tablet Take 1 tablet (0.125 mg total) by mouth every other day. 01/28/20   Loletha Grayer, MD  ipratropium-albuterol (DUONEB) 0.5-2.5 (3) MG/3ML SOLN Inhale 3 mLs into the lungs every 6 (six) hours as needed. 01/01/20   [provider]  linezolid (ZYVOX) 600 MG tablet Take 600 mg by mouth 2 (two) times daily. 02/23/20   [provider]  metoprolol succinate (TOPROL-XL) 50 MG 24 hr tablet Take 1 tablet (50 mg total) by  mouth in the morning and at bedtime. Take with or immediately following a meal. 01/28/20   Loletha Grayer, MD  Multiple Vitamin (MULTIVITAMIN WITH MINERALS) TABS tablet Take 1 tablet by mouth daily. 12/22/19   Loletha Grayer, MD  nicotine (NICODERM CQ - DOSED IN MG/24 HOURS) 21 mg/24hr patch One patch chest wall daily (okay to substitute generic) 01/28/20   Leslye Peer, Richard, MD  ondansetron (ZOFRAN ODT) 4 MG disintegrating tablet Take 1 tablet (4 mg total) by mouth every 8 (eight) hours as needed for nausea or vomiting. 07/17/20   Paulette Blanch, MD  Rivaroxaban (XARELTO) 15 MG TABS tablet Take 15 mg by mouth daily with supper.    [provider]  sacubitril-valsartan (ENTRESTO) 49-51 MG Take 1 tablet by mouth 2 (two) times daily.    [provider]  torsemide (DEMADEX) 20 MG tablet Take 20 mg by mouth daily.    [provider]  traMADol (ULTRAM) 50 MG tablet Take 1 tablet (50 mg total) by mouth every 6 (six) hours as needed. 07/17/20   Paulette Blanch, MD  zinc sulfate 220 (50 Zn) MG capsule Take 1 capsule (220 mg total) by mouth daily. 12/22/19   Loletha Grayer, MD    Allergies Lipitor [atorvastatin calcium]  History reviewed. No pertinent family history.  Social History Social History   Tobacco Use  . Smoking status: Current Every Day Smoker    Packs/day: 0.00    Types: Cigarettes  . Smokeless tobacco: Never Used  Substance Use Topics  . 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 Drug use: Never    Review of Systems Constitutional: Positive for generalized weakness. Eyes: No visual changes. ENT: No sore throat. Cardiovascular: Denies chest pain. Respiratory: Denies shortness of breath. Gastrointestinal: Positive for decreased oral intake.  Genitourinary: Negative for dysuria. Musculoskeletal: Negative for back pain. Skin: Negative for rash. Neurological: Negative for headaches, focal weakness or  numbness.  ____________________________________________   PHYSICAL EXAM:  VITAL SIGNS: ED Triage Vitals  Enc Vitals Group     BP 07/20/20 1506 (!) 85/51     Pulse Rate 07/20/20 1506 (!) 126     Resp 07/20/20 1506 (!) 22     Temp 07/20/20 1516 98.6 F (37 C)     Temp Source 07/20/20 1516 Oral     SpO2 07/20/20 1506 93 %     Weight 07/20/20 1509 288 lb 12.8 oz (131 kg)     Height 07/20/20 1509 6\' 1"  (1.854 m)     Head Circumference --      Peak Flow --      Pain Score 07/20/20 1509 0   Constitutional: Alert and oriented.  Eyes: Conjunctivae are normal.  ENT      Head: Normocephalic and atraumatic.      Nose: No congestion/rhinnorhea.      Mouth/Throat: Mucous membranes are moist.      Neck: No stridor. Hematological/Lymphatic/Immunilogical: No cervical lymphadenopathy. Cardiovascular: Tachycardic, irregular rhythm.  No murmurs, rubs, or gallops. Respiratory: Normal respiratory effort without tachypnea nor retractions. Breath sounds are clear and equal bilaterally. No wheezes/rales/rhonchi. Gastrointestinal: Soft and tender to palpation in the right upper quadrant. Genitourinary: Deferred Musculoskeletal: Normal range of motion in all extremities. No lower extremity edema. Neurologic:  Normal speech and language. No gross focal neurologic deficits are appreciated.  Skin:  Skin is warm, dry and intact. No rash noted. Psychiatric: Mood and affect are normal. Speech and behavior are normal. Patient exhibits appropriate insight and judgment.  ____________________________________________    LABS (pertinent positives/negatives)  CBC wbc 20.7, hgb 14.0, plt 202 BMP wnl except glu 114, cr 1.31  ____________________________________________   EKG  I, Nance Pear, attending physician, personally viewed and interpreted this EKG  EKG Time: 1508 Rate: 119 Rhythm: atrial fibrillation Axis: normal Intervals: qtc 436 QRS: low voltage qrs ST changes: no st  elevation Impression: abnormal ekg  ____________________________________________    RADIOLOGY  RUQ US Findings again equivocal for gallstones  CT head Concern for possible occlusion of basilar artery  I, Nance Pear, personally discussed these images (CT scan) and results by phone with the on-call radiologist and used this discussion as part of my medical decision making.   ____________________________________________   PROCEDURES  Procedures  ____________________________________________   INITIAL IMPRESSION / ASSESSMENT AND PLAN / ED COURSE  Pertinent labs & imaging results that were available during my care of the patient were reviewed by me and considered in my medical decision making (see chart for details).   Patient presented to the emergency department today because of concerns for weakness.  At this time I do have concerns that the patient is septic from possible cholecystitis.  Patient does meet SIRS criteria.  Ultrasound again today is concerning for acute cholecystitis. CT head was ordered from triage and did show possible occlusion of the basilar artery.  I did discuss this with the radiologist.  At this time given lack of any focal neuro deficits I think likely findings on CT scan are artifactual.  Will plan on admission for sepsis secondary to cholecystitis.  Dissection was with Dr. Celine Ahr with surgery at this point does not think patient is surgical candidate.  Will admit to the hospital service.  ____________________________________________   FINAL CLINICAL IMPRESSION(S) / ED DIAGNOSES  Final diagnoses:  Abdominal pain     Note: This dictation was prepared with Dragon dictation. Any transcriptional errors that result from this process are unintentional     Nance Pear, MD 07/20/20 9842

## 2020-07-21 ENCOUNTER — Inpatient Hospital Stay: Payer: Medicare Other

## 2020-07-21 DIAGNOSIS — K819 Cholecystitis, unspecified: Secondary | ICD-10-CM

## 2020-07-21 DIAGNOSIS — K8000 Calculus of gallbladder with acute cholecystitis without obstruction: Secondary | ICD-10-CM

## 2020-07-21 LAB — CBC
HCT: 41.2 % (ref 39.0–52.0)
Hemoglobin: 13.4 g/dL (ref 13.0–17.0)
MCH: 30.2 pg (ref 26.0–34.0)
MCHC: 32.5 g/dL (ref 30.0–36.0)
MCV: 93 fL (ref 80.0–100.0)
Platelets: 190 10*3/uL (ref 150–400)
RBC: 4.43 MIL/uL (ref 4.22–5.81)
RDW: 17.2 % — ABNORMAL HIGH (ref 11.5–15.5)
WBC: 15 10*3/uL — ABNORMAL HIGH (ref 4.0–10.5)
nRBC: 0 % (ref 0.0–0.2)

## 2020-07-21 LAB — COMPREHENSIVE METABOLIC PANEL
ALT: 34 U/L (ref 0–44)
AST: 23 U/L (ref 15–41)
Albumin: 2.5 g/dL — ABNORMAL LOW (ref 3.5–5.0)
Alkaline Phosphatase: 77 U/L (ref 38–126)
Anion gap: 8 (ref 5–15)
BUN: 19 mg/dL (ref 8–23)
CO2: 25 mmol/L (ref 22–32)
Calcium: 8.7 mg/dL — ABNORMAL LOW (ref 8.9–10.3)
Chloride: 104 mmol/L (ref 98–111)
Creatinine, Ser: 1.27 mg/dL — ABNORMAL HIGH (ref 0.61–1.24)
GFR calc Af Amer: >60 mL/min (ref >60)
GFR calc non Af Amer: 55 mL/min — ABNORMAL LOW (ref >60)
Glucose, Bld: 86 mg/dL (ref 70–99)
Potassium: 4.4 mmol/L (ref 3.5–5.1)
Sodium: 137 mmol/L (ref 135–145)
Total Bilirubin: 2.9 mg/dL — ABNORMAL HIGH (ref 0.3–1.2)
Total Protein: 6.1 g/dL — ABNORMAL LOW (ref 6.5–8.1)

## 2020-07-21 MED ORDER — METOPROLOL SUCCINATE ER 50 MG PO TB24
50.0000 mg | ORAL_TABLET | Freq: Every day | ORAL | Status: DC
  Administered 2020-07-21 – 2020-07-24 (×4): 50 mg via ORAL
  Filled 2020-07-21 (×4): qty 1

## 2020-07-21 MED ORDER — DIGOXIN 125 MCG PO TABS
0.1250 mg | ORAL_TABLET | ORAL | Status: DC
  Administered 2020-07-21 – 2020-07-23 (×2): 0.125 mg via ORAL
  Filled 2020-07-21 (×2): qty 1

## 2020-07-21 MED ORDER — AMIODARONE HCL 200 MG PO TABS
400.0000 mg | ORAL_TABLET | Freq: Two times a day (BID) | ORAL | Status: DC
  Administered 2020-07-21 – 2020-07-24 (×8): 400 mg via ORAL
  Filled 2020-07-21 (×8): qty 2

## 2020-07-21 MED ORDER — ALBUTEROL SULFATE (2.5 MG/3ML) 0.083% IN NEBU: 2.5000 mg | INHALATION_SOLUTION | Freq: Four times a day (QID) | RESPIRATORY_TRACT | Status: DC | PRN

## 2020-07-21 MED ORDER — GADOBUTROL 1 MMOL/ML IV SOLN: 10.0000 mL | Freq: Once | INTRAVENOUS | Status: DC | PRN

## 2020-07-21 MED ORDER — TORSEMIDE 20 MG PO TABS
20.0000 mg | ORAL_TABLET | Freq: Every day | ORAL | Status: DC
  Filled 2020-07-21 (×4): qty 1

## 2020-07-21 NOTE — Progress Notes (Signed)
PROGRESS NOTE    Stanley Marks  OIZ:[id number] DOB: 07-03-1941 DOA: 07/20/2020 PCP: Perrin Maltese, MD    Brief Narrative:  79 year old gentleman with extensive medical issues including chronic systolic congestive heart failure, A. fib on Xarelto, hypertension, peripheral vascular disease, COPD, obstructive sleep apnea presented to the emergency room with weakness and fall.  Recently seen at the emergency room with cholelithiasis concerning for cholecystitis.  He has been feeling weak and not able to eat much so came back to the emergency room.  In the emergency room he is found to have leukocytosis.  Abdominal ultrasound again shows equivocal for acute cholecystitis.  Admitted with surgical consultation.   Assessment & Plan:   Principal Problem:   Acute calculous cholecystitis Active Problems:   Chronic systolic CHF (congestive heart failure) (HCC)   COPD with chronic bronchitis (HCC)   Atrial fibrillation, chronic (HCC)  Acute calculus cholecystitis: MRCP today ruled out choledocholithiasis.  Patient remains on Rocephin and Flagyl.  Seen and followed by surgery.  Currently hemodynamically stable.  Given extensive medical problems, recommended minimal invasive therapy with cholecystostomy.  Seen by IR.  Scheduled for cholecystostomy tube placement tomorrow.  Adequate pain medications.  Continue antibiotics.  Acute kidney injury: Improving with IV fluids.  Chronic atrial fibrillation: Rate controlled.  Holding Xarelto.  Continue on amiodarone digoxin and metoprolol.  COPD: With chronic hypoxic failure.  On albuterol as needed.  No evidence of exacerbation.  Chronic systolic congestive heart failure: Currently euvolemic.  Entresto on hold.  Stable.   DVT prophylaxis: SCDs Start: 07/20/20 2055   Code Status: Full code Family Communication: None.  Patient is communicating with his daughter-in-law.  Patient stated his son is also sick in the hospital. Disposition Plan: Status is:  Inpatient  Remains inpatient appropriate because:IV treatments appropriate due to intensity of illness or inability to take PO and Inpatient level of care appropriate due to severity of illness   Dispo: The patient is from: Home              Anticipated d/c is to: Home              Anticipated d/c date is: 3 days              Patient currently is not medically stable to d/c.         Consultants:   General surgery  Interventional radiology  Procedures:   None  Antimicrobials:  Anti-infectives (From admission, onward)   Start     Dose/Rate Route Frequency Ordered Stop   07/21/20 1900  cefTRIAXone (ROCEPHIN) 2 g in sodium chloride 0.9 % 100 mL IVPB     Discontinue     2 g 200 mL/hr over 30 Minutes Intravenous Every 24 hours 07/20/20 2059     07/20/20 2200  metroNIDAZOLE (FLAGYL) IVPB 500 mg     Discontinue     500 mg 100 mL/hr over 60 Minutes Intravenous Every 8 hours 07/20/20 2059     07/20/20 2100  cefTRIAXone (ROCEPHIN) 1 g in sodium chloride 0.9 % 100 mL IVPB  Status:  Discontinued        1 g 200 mL/hr over 30 Minutes Intravenous Every 24 hours 07/20/20 2059 07/20/20 2059   07/20/20 1815  cefTRIAXone (ROCEPHIN) 1 g in sodium chloride 0.9 % 100 mL IVPB  Status:  Discontinued        1 g 200 mL/hr over 30 Minutes Intravenous  Once 07/20/20 1812 07/20/20 1814   07/20/20 1815  cefTRIAXone (ROCEPHIN) 2 g in sodium chloride 0.9 % 100 mL IVPB        2 g 200 mL/hr over 30 Minutes Intravenous  Once 07/20/20 1814 07/20/20 2122         Subjective: Patient was seen and examined.  Complains of epigastric pain and back pain.  He is more worried about going home with cholecystostomy tube and to live with it at home.  Denies any nausea or vomiting.  Objective: Vitals:   07/20/20 2208 07/21/20 0518 07/21/20 0957 07/21/20 1242  BP: 115/67 100/67 109/72 115/75  Pulse: (!) 105 95 (!) 108 95  Resp: 18 20 20 20   Temp: 98.4 F (36.9 C) 98.4 F (36.9 C) 97.6 F (36.4 C) 97.8 F  (36.6 C)  TempSrc:   Oral   SpO2: 92% 90% 94% 94%  Weight:      Height:        Intake/Output Summary (Last 24 hours) at 07/21/2020 1737 Last data filed at 07/21/2020 1352 Gross per 24 hour  Intake 2000.2 ml  Output 650 ml  Net 1350.2 ml   Filed Weights   07/20/20 1509 07/20/20 2202  Weight: 131 kg (!) 140.7 kg    Examination:  General exam: Chronically sick looking gentleman on 2 L oxygen.  Not in distress but anxious. Respiratory system: Clear to auscultation. Respiratory effort normal. Cardiovascular system: S1 & S2 heard, irregularly irregular.   Gastrointestinal system: Abdomen is distended but nontender.  No localized tenderness.  Bowel sounds present.  Obese and pendulous.   Central nervous system: Alert and oriented. No focal neurological deficits. Extremities: Symmetric 5 x 5 power. Skin: No rashes, lesions or ulcers Psychiatry: Judgement and insight appear normal. Mood & affect appropriate.     Data Reviewed: I have personally reviewed following labs and imaging studies  CBC: Recent Labs  Lab 07/16/20 1820 07/20/20 1515 07/21/20 0427  WBC 14.2* 20.7* 15.0*  HGB 17.1* 14.0 13.4  HCT 51.8 43.9 41.2  MCV 91.4 94.6 93.0  PLT 228 202 086   Basic Metabolic Panel: Recent Labs  Lab 07/16/20 1820 07/20/20 1515 07/21/20 0427  NA 142 135 137  K 4.5 4.6 4.4  CL 102 101 104  CO2 29 25 25   GLUCOSE 147* 114* 86  BUN 16 17 19   CREATININE 1.42* 1.31* 1.27*  CALCIUM 10.2 9.2 8.7*   GFR: Estimated Creatinine Clearance: 72.9 mL/min (A) (by C-G formula based on SCr of 1.27 mg/dL (H)). Liver Function Tests: Recent Labs  Lab 07/16/20 1820 07/21/20 0427  AST 12* 23  ALT 17 34  ALKPHOS 72 77  BILITOT 1.0 2.9*  PROT 7.8 6.1*  ALBUMIN 3.8 2.5*   Recent Labs  Lab 07/16/20 1820  LIPASE 34   No results for input(s): AMMONIA in the last 168 hours. Coagulation Profile: No results for input(s): INR, PROTIME in the last 168 hours. Cardiac Enzymes: No results  for input(s): CKTOTAL, CKMB, CKMBINDEX, TROPONINI in the last 168 hours. BNP (last 3 results) No results for input(s): PROBNP in the last 8760 hours. HbA1C: No results for input(s): HGBA1C in the last 72 hours. CBG: No results for input(s): GLUCAP in the last 168 hours. Lipid Profile: No results for input(s): CHOL, HDL, LDLCALC, TRIG, CHOLHDL, LDLDIRECT in the last 72 hours. Thyroid Function Tests: No results for input(s): TSH, T4TOTAL, FREET4, T3FREE, THYROIDAB in the last 72 hours. Anemia Panel: No results for input(s): VITAMINB12, FOLATE, FERRITIN, TIBC, IRON, RETICCTPCT in the last 72 hours. Sepsis Labs: No  results for input(s): PROCALCITON, LATICACIDVEN in the last 168 hours.  Recent Results (from the past 240 hour(s))  Blood culture (routine x 2)     Status: None (Preliminary result)   Collection Time: 07/20/20  6:57 PM   Specimen: BLOOD  Result Value Ref Range Status   Specimen Description BLOOD LEFT ANTECUBITAL  Final   Special Requests   Final    BOTTLES DRAWN AEROBIC AND ANAEROBIC Blood Culture results may not be optimal due to an inadequate volume of blood received in culture bottles   Culture   Final    NO GROWTH < 12 HOURS Performed at [facility], [address] [place], [place] [address]    Report Status PENDING  Incomplete  Blood culture (routine x 2)     Status: None (Preliminary result)   Collection Time: 07/20/20  6:57 PM   Specimen: BLOOD  Result Value Ref Range Status   Specimen Description BLOOD RIGHT ANTECUBITAL  Final   Special Requests   Final    BOTTLES DRAWN AEROBIC AND ANAEROBIC Blood Culture adequate volume   Culture   Final    NO GROWTH < 12 HOURS Performed at [facility], [address], [place], [place] [address]    Report Status PENDING  Incomplete  SARS Coronavirus 2 by RT PCR (hospital order, performed in [place] hospital lab) Nasopharyngeal Nasopharyngeal Swab     Status: None   Collection Time: 07/20/20  7:30 PM    Specimen: Nasopharyngeal Swab  Result Value Ref Range Status   SARS Coronavirus 2 NEGATIVE NEGATIVE Final    Comment: (NOTE) SARS-CoV-2 target nucleic acids are NOT DETECTED.  The SARS-CoV-2 RNA is generally detectable in upper and lower respiratory specimens during the acute phase of infection. The lowest concentration of SARS-CoV-2 viral copies this assay can detect is 250 copies / mL. A negative result does not preclude SARS-CoV-2 infection and should not be used as the sole basis for treatment or other patient management decisions.  A negative result may occur with improper specimen collection / handling, submission of specimen other than nasopharyngeal swab, presence of viral mutation(s) within the areas targeted by this assay, and inadequate number of viral copies (<250 copies / mL). A negative result must be combined with clinical observations, patient history, and epidemiological information.  Fact Sheet for Patients:   StrictlyIdeas.no  Fact Sheet for Healthcare Providers: BankingDealers.co.za  This test is not yet approved or  cleared by the [place] FDA and has been authorized for detection and/or diagnosis of SARS-CoV-2 by FDA under an Emergency Use Authorization (EUA).  This EUA will remain in effect (meaning this test can be used) for the duration of the COVID-19 declaration under Section 564(b)(1) of the Act, 21 U.S.C. section 360bbb-3(b)(1), unless the authorization is terminated or revoked sooner.  Performed at [facility], [address], [place], [place] [address]          Radiology Studies: CT HEAD WO CONTRAST  Addendum Date: 07/20/2020   ADDENDUM REPORT: 07/20/2020 16:22 ADDENDUM: These results were called by telephone at the time of interpretation on 07/20/2020 at 4:21 pm to provider Nance Pear , who verbally acknowledged these results. Electronically Signed   By: Fidela Salisbury MD    On: 07/20/2020 16:22   Result Date: 07/20/2020 CLINICAL DATA:  Head injury, altered mental status EXAM: CT HEAD WITHOUT CONTRAST TECHNIQUE: Contiguous axial images were obtained from the base of the skull through the vertex without intravenous contrast. COMPARISON:  None. FINDINGS: Brain: There is normal anatomic configuration of the brain. Mild periventricular white matter changes are present likely reflecting the sequela of small vessel ischemia. Mild parenchymal volume loss is commensurate with the patient's age. 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 No evidence of acute intracranial hemorrhage or infarct. No abnormal mass effect or midline shift. No abnormal intra or extra-axial mass lesion or fluid collection. Ventricular size is normal. The cerebellum is unremarkable. Vascular: There is focal hyperdensity identified involving the basilar artery at its confluence, best seen on axial image # 8/2, coronal image # 36/4, and sagittal image # 30/5. While unusual in this location, focal hyperdense clot is difficult to exclude. Asymmetric atherosclerotic calcification could result in a similar appearance. Skull: Intact Sinuses/Orbits: Bilateral maxillary antrostomy has been performed. Mild mucosal thickening is noted within the ethmoid air cells bilaterally. Remaining paranasal sinuses are clear. Orbits are unremarkable. Other: Mastoid air cells and middle ear cavities are clear. IMPRESSION: 1. Focal hyperdensity involving the basilar artery at its confluence. While unusual in this location, focal hyperdense clot is difficult to exclude. Asymmetric atherosclerotic calcification could result in a similar appearance. Correlation with neurologic examination is recommended. If there is concern for basilar artery thrombosis, CT arteriography is recommended for further evaluation. 2. 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 3. Mild periventricular white matter changes likely reflecting the sequela of small vessel ischemia. Electronically Signed: By: Fidela Salisbury MD On: 07/20/2020 16:14   MR ABDOMEN MRCP WO CONTRAST  Result Date: 07/21/2020 CLINICAL DATA:  Right upper quadrant pain. Nondiagnostic ultrasound. Elevated bilirubin. Rule out choledocholithiasis. EXAM: MRI ABDOMEN WITHOUT CONTRAST  (INCLUDING MRCP) TECHNIQUE: Multiplanar multisequence MR imaging of the abdomen was performed. Heavily T2-weighted images of the biliary and pancreatic ducts were obtained, and three-dimensional MRCP images were rendered by post processing. COMPARISON:  Abdominal ultrasound 1121. FINDINGS: Exam is degraded by mild to moderate motion and lack of IV contrast. Lower chest: Moderate cardiomegaly. Trace pleural fluid is likely physiologic. Hepatobiliary: Mild hepatic steatosis. No suspicious liver lesion. No intrahepatic biliary duct dilatation. The gallbladder is mildly distended. Small stones within. Gallbladder wall thickening and irregularity, including on 26/7. Mild pericholecystic edema. The common duct measures on the order of 4 mm on 41/13, normal. No choledocholithiasis given motion degradation. Pancreas:  Normal, without mass or ductal dilatation. Spleen:  Normal in size, without focal abnormality. Adrenals/Urinary Tract: Normal right adrenal gland. Mild left adrenal thickening and nodularity, likely due to an adenoma 2.1 cm on 21/4. T2 hyperintense bilateral renal lesions are likely cysts but incompletely characterized. A posterior interpolar right renal T2 hypointense 1.3 cm lesion on 13/2 is mildly heterogeneous T1 signal on 68/16. Similar in size on the CT of 03/05/2019. No hydronephrosis. Stomach/Bowel: Normal stomach and abdominal bowel loops. Vascular/Lymphatic: The infrarenal aorta is tortuous, dilated at up to 3.2 cm on 21/2. Prominent nodes including a porta hepatis node of  1.2 cm on 19/7. Similar to 03/05/2019. 7 mm right retrocrural node on this image. Aortocaval index node of 1.1 cm on 31/7. Other: Nonspecific mesenteric edema with anasarca, possibly representing fluid overload. Musculoskeletal: Convex left lumbar spine curvature. IMPRESSION: 1. Degraded exam secondary to motion and lack of IV contrast. 2. Findings suspicious for acute cholecystitis. No choledocholithiasis or biliary duct dilatation. 3. 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 Recommend follow-up every 3 years. This recommendation follows  ACR consensus guidelines: White Paper of the ACR Incidental Findings Committee II on Vascular Findings. J Am Coll Radiol 2013; 10:789-794. 4. Prominent but not pathologically enlarged upper abdominal nodes are most likely reactive in the setting of steatosis. 5. Right renal lesion is similar in size to 03/05/2019, incompletely characterized. Most likely a hemorrhagic cyst. Solid neoplasm such as papillary type renal cell carcinoma cannot be excluded. Consider renal ultrasound to allow surveillance. Alternatively, if the patient can undergo pre and postcontrast CT or MRI, this should be considered (when patient is able to follow directions and breath hold). Electronically Signed   By: Abigail Miyamoto M.D.   On: 07/21/2020 14:32   MR 3D Recon At Scanner  Result Date: 07/21/2020 CLINICAL DATA:  Right upper quadrant pain. Nondiagnostic ultrasound. Elevated bilirubin. Rule out choledocholithiasis. EXAM: MRI ABDOMEN WITHOUT CONTRAST  (INCLUDING MRCP) TECHNIQUE: Multiplanar multisequence MR imaging of the abdomen was performed. Heavily T2-weighted images of the biliary and pancreatic ducts were obtained, and three-dimensional MRCP images were rendered by post processing. COMPARISON:  Abdominal ultrasound 1121. FINDINGS: Exam is degraded by mild to moderate motion and lack of IV contrast. Lower chest: Moderate cardiomegaly. Trace pleural fluid is likely physiologic. Hepatobiliary:  Mild hepatic steatosis. No suspicious liver lesion. No intrahepatic biliary duct dilatation. The gallbladder is mildly distended. Small stones within. Gallbladder wall thickening and irregularity, including on 26/7. Mild pericholecystic edema. The common duct measures on the order of 4 mm on 41/13, normal. No choledocholithiasis given motion degradation. Pancreas:  Normal, without mass or ductal dilatation. Spleen:  Normal in size, without focal abnormality. Adrenals/Urinary Tract: Normal right adrenal gland. Mild left adrenal thickening and nodularity, likely due to an adenoma 2.1 cm on 21/4. T2 hyperintense bilateral renal lesions are likely cysts but incompletely characterized. A posterior interpolar right renal T2 hypointense 1.3 cm lesion on 13/2 is mildly heterogeneous T1 signal on 68/16. Similar in size on the CT of 03/05/2019. No hydronephrosis. Stomach/Bowel: Normal stomach and abdominal bowel loops. Vascular/Lymphatic: The infrarenal aorta is tortuous, dilated at up to 3.2 cm on 21/2. Prominent nodes including a porta hepatis node of 1.2 cm on 19/7. Similar to 03/05/2019. 7 mm right retrocrural node on this image. Aortocaval index node of 1.1 cm on 31/7. Other: Nonspecific mesenteric edema with anasarca, possibly representing fluid overload. Musculoskeletal: Convex left lumbar spine curvature. IMPRESSION: 1. Degraded exam secondary to motion and lack of IV contrast. 2. Findings suspicious for acute cholecystitis. No choledocholithiasis or biliary duct dilatation. 3. 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 Recommend follow-up every 3 years. This recommendation follows ACR consensus guidelines: White Paper of the ACR Incidental Findings Committee II on Vascular Findings. J Am Coll Radiol 2013; 10:789-794. 4. Prominent but not pathologically enlarged upper abdominal nodes are most likely reactive in the setting of steatosis. 5. Right renal lesion is similar in size to 03/05/2019, incompletely  characterized. Most likely a hemorrhagic cyst. Solid neoplasm such as papillary type renal cell carcinoma cannot be excluded. Consider renal ultrasound to allow surveillance. Alternatively, if the patient can undergo pre and postcontrast CT or MRI, this should be considered (when patient is able to follow directions and breath hold). Electronically Signed   By: Abigail Miyamoto M.D.   On: 07/21/2020 14:32   US Abdomen Limited RUQ  Result Date: 07/20/2020 CLINICAL DATA:  Abdominal pain times 3-4 days. EXAM: ULTRASOUND ABDOMEN LIMITED RIGHT UPPER QUADRANT COMPARISON:  07/17/2020 FINDINGS: Gallbladder: There is gallbladder wall thickening with the gallbladder wall measuring up to  approximately 6 mm. The sonographic Percell Miller sign is negative. There are gallstones. The gallbladder appears to be distended. Common bile duct: Diameter: 5 mm Liver: Diffuse increased echogenicity with slightly heterogeneous liver. Appearance typically secondary to fatty infiltration. Fibrosis secondary consideration. No secondary findings of cirrhosis noted. No focal hepatic lesion or intrahepatic biliary duct dilatation. Portal vein is patent on color Doppler imaging with normal direction of blood flow towards the liver. Other: None. IMPRESSION: 1. Findings are again equivocal for acute cholecystitis. If there is high clinical suspicion, follow-up with HIDA scan is recommended. 2. Hepatic steatosis. Electronically Signed   By: Constance Holster M.D.   On: 07/20/2020 17:48        Scheduled Meds: . amiodarone  400 mg Oral BID  . digoxin  0.125 mg Oral QODAY  . metoprolol succinate  50 mg Oral Daily  . torsemide  20 mg Oral Daily   Continuous Infusions: . cefTRIAXone (ROCEPHIN)  IV    . metronidazole 500 mg (07/21/20 1352)     LOS: 1 day    Time spent: 30 minutes    Barb Merino, MD [facility] Pager [phone]

## 2020-07-21 NOTE — Consult Note (Addendum)
Chief Complaint: Patient was seen in consultation today for percutaneous cholecystostomy placement.  Referring Physician(s): Edison Simon, PA-C  Supervising Physician: Arne Cleveland  Patient Status: [name] - In-pt  History of Present Illness: Stanley Marks is a 79 y.o. male with a past medical history significant for OSA, squamous cell carcinoma, emphysema, COPD, PVD, CHF, a.fib on Xarelto and HTN who presented to [facility] ED yesterday with complaints of weakness, poor PO intake and fall at home x 2. He was previously seen in the ED on 07/17/20 with complaints of abdominal pain, n/v and diarrhea - subsequent workup at that time notable for moderate leukocytosis, AKI and cholelithiasis with possible acute cholecystitis by [place]. During his wait in the ED his symptoms improved and he was eager to be discharged home, general surgery was consulted and recommended Augmentin PO x 10 days and outpatient follow up with their office. He was unable to make this follow up appointment due to weakness and instead presented to the ED for further evaluation. Workup yesterday notable for WBC 20.7, hgb 14, plt 202, creatinine 1.31, negative blood cx so far. Repeat RUQ [place] essentially unchanged from previous with findings equivocal for acute cholecystitis and hepatic steatosis. He was admitted and general surgery was again consulted. Due to his comorbid conditions making him a poor surgical candidate IR has been asked to consider percutaneous cholecystostomy placement.   [name] seen in his room today, he is requesting to eat something or "at least have some milk and broth." He is very hungry and denies abdominal pain, n/v currently. He is upset that he is not going to have surgery because "I'm as healthy as I was in my 79s when I had surgery so why can't they do it now?" We discussed concern for use of ventilator in some with COPD/CHF as well as higher bleeding risk with surgical procedure in someone with a.fib. We also  discussed use of moderate sedation in IR which helps to mitigate some of these risks. He states understanding and is agreeable to cholecystostomy although he is not very happy about the catheter remaining for at least 6 weeks and having to care of it at home though he is willing to try.   Past Medical History:  Diagnosis Date  . Basal cell carcinoma 08/24/2014   chin, L nose ant alar crease  . Cancer ([place])    skin  . Emphysema of lung ([place])   . Hypertension   . PVD (peripheral vascular disease) ([place])   . 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 APPENDECTOMY    . arm surgery    . ELECTROPHYSIOLOGIC STUDY N/A 11/20/2016   Procedure: CARDIOVERSION;  Surgeon: Dionisio David, MD;  Location: [facility] ORS;  Service: Cardiovascular;  Laterality: N/A;    Allergies: Lipitor [atorvastatin calcium]  Medications: Prior to Admission medications   Medication Sig Start Date End Date Taking? Authorizing Provider  ADVAIR HFA 230-21 MCG/ACT inhaler Inhale 1 puff into the lungs 2 (two) times daily as needed.  12/17/19  Yes [provider]  albuterol (VENTOLIN HFA) 108 (90 Base) MCG/ACT inhaler Inhale 2 puffs into the lungs every 6 (six) hours as needed for wheezing or shortness of breath. 12/21/19  Yes Vanessa [place], MD  amiodarone (PACERONE) 200 MG tablet Take 2 tablets (400 mg total)  by mouth 2 (two) times daily. 01/28/20  Yes Loletha Grayer, MD  amoxicillin-clavulanate (AUGMENTIN) 875-125 MG tablet Take 1 tablet by mouth 2 (two) times daily for 10 days. 07/17/20 07/27/20 Yes Paulette Blanch, MD  digoxin (LANOXIN) 0.125 MG tablet Take 1 tablet (0.125 mg total) by mouth every other day. 01/28/20  Yes Wieting, Richard, MD  metoprolol succinate (TOPROL-XL) 50 MG 24 hr tablet Take 1 tablet (50 mg total) by mouth in the morning and at bedtime.  Take with or immediately following a meal. 01/28/20  Yes Wieting, Richard, MD  ondansetron (ZOFRAN ODT) 4 MG disintegrating tablet Take 1 tablet (4 mg total) by mouth every 8 (eight) hours as needed for nausea or vomiting. 07/17/20  Yes Paulette Blanch, MD  Rivaroxaban (XARELTO) 15 MG TABS tablet Take 15 mg by mouth daily with supper.   Yes [provider]  sacubitril-valsartan (ENTRESTO) 49-51 MG Take 1 tablet by mouth 2 (two) times daily.   Yes [provider]  torsemide (DEMADEX) 20 MG tablet Take 20 mg by mouth daily.   Yes [provider]  traMADol (ULTRAM) 50 MG tablet Take 1 tablet (50 mg total) by mouth every 6 (six) hours as needed. 07/17/20  Yes Paulette Blanch, MD     History reviewed. No pertinent family history.  Social History   Socioeconomic History  . Marital status: Married    Spouse name: Not on file  . Number of children: Not on file  . Years of education: Not on file  . Highest education level: Not on file  Occupational History  . Not on file  Tobacco Use  . Smoking status: Current Every Day Smoker    Packs/day: 0.00    Types: Cigarettes  . Smokeless tobacco: Never Used  Substance and Sexual Activity  . 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 Drug use: Never  . Sexual activity: Not on file  Other Topics Concern  . Not on file  Social History Narrative  . Not on file   Social Determinants of Health   Financial Resource Strain:   . Difficulty of Paying Living Expenses:   Food Insecurity:   . Worried About Charity fundraiser in the Last Year:   . Arboriculturist in the Last Year:   Transportation Needs:   . Film/video editor (Medical):   Marland Kitchen Lack of Transportation (Non-Medical):   Physical Activity:   . Days of Exercise per Week:   . Minutes of Exercise per Session:   Stress:   . Feeling of Stress :   Social Connections:   . Frequency of Communication with Friends and Family:   . Frequency  of Social Gatherings with Friends and Family:   . Attends Religious Services:   . Active Member of Clubs or Organizations:   . Attends Archivist Meetings:   Marland Kitchen Marital Status:      Review of Systems: A 12 point ROS discussed and pertinent positives are indicated in the HPI above.  All other systems are negative.  Review of Systems  Constitutional: Negative for chills and fever.  Respiratory: Negative for cough and shortness of breath.   Cardiovascular: Negative for chest pain.  Gastrointestinal: Negative for abdominal pain, diarrhea, nausea and vomiting.  Neurological: Negative for dizziness and headaches.    Vital Signs: BP 109/72 (BP Location: Right Arm)   Pulse (!) 108   Temp 97.6 F (  36.4 C) (Oral)   Resp 20   Ht 6\' 1"  (1.854 m)   Wt (!) 310 lb 3.2 oz (140.7 kg)   SpO2 94%   BMI 40.93 kg/m   Physical Exam Vitals and nursing note reviewed.  Constitutional:      General: He is not in acute distress.    Appearance: He is obese. He is ill-appearing.  HENT:     Head: Normocephalic.     Mouth/Throat:     Mouth: Mucous membranes are moist.     Pharynx: Oropharynx is clear. No oropharyngeal exudate or posterior oropharyngeal erythema.  Cardiovascular:     Rate and Rhythm: Tachycardia present. Rhythm irregular.  Pulmonary:     Effort: Pulmonary effort is normal.     Comments: Decreased breath sounds bilaterally - anterior exam only. (+) supplemental O2 via [place] Abdominal:     General: There is no distension.     Palpations: Abdomen is soft.     Tenderness: There is no abdominal tenderness.  Skin:    General: Skin is warm and dry.  Neurological:     Mental Status: He is alert and oriented to person, place, and time.  Psychiatric:        Mood and Affect: Mood normal.        Behavior: Behavior normal.        Thought Content: Thought content normal.        Judgment: Judgment normal.      MD Evaluation Airway: WNL Heart: WNL Abdomen: WNL Chest/ Lungs:  WNL ASA  Classification: 3 Mallampati/Airway Score: Two   Imaging: DG Chest 2 View  Result Date: 07/17/2020 CLINICAL DATA:  Chest pain and upper abdominal pain. Diarrhea and nausea. EXAM: CHEST - 2 VIEW COMPARISON:  01/22/2020 FINDINGS: Mild cardiac enlargement. Pulmonary vascularity is normal. Mild peribronchial thickening and central interstitial pattern similar to prior study and likely representing chronic bronchitis. No airspace disease or consolidation. No pleural effusions. No pneumothorax. Mediastinal contours appear intact. Calcification of the aorta. Degenerative changes in the spine and shoulders. IMPRESSION: Mild cardiac enlargement. No evidence of active pulmonary disease. Electronically Signed   By: Lucienne Capers M.D.   On: 07/17/2020 03:50   CT HEAD WO CONTRAST  Addendum Date: 07/20/2020   ADDENDUM REPORT: 07/20/2020 16:22 ADDENDUM: These results were called by telephone at the time of interpretation on 07/20/2020 at 4:21 pm to provider [facility] , who verbally acknowledged these results. Electronically Signed   By: Fidela Salisbury MD   On: 07/20/2020 16:22   Result Date: 07/20/2020 CLINICAL DATA:  Head injury, altered mental status EXAM: CT HEAD WITHOUT CONTRAST TECHNIQUE: Contiguous axial images were obtained from the base of the skull through the vertex without intravenous contrast. COMPARISON:  None. FINDINGS: Brain: There is normal anatomic configuration of the brain. Mild periventricular white matter changes are present likely reflecting the sequela of small vessel ischemia. Mild parenchymal volume loss is commensurate with the patient's age. 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 No evidence of acute intracranial hemorrhage or infarct. No abnormal mass effect or midline shift. No abnormal intra or extra-axial mass lesion or fluid collection. Ventricular size is normal. The cerebellum is  unremarkable. Vascular: There is focal hyperdensity identified involving the basilar artery at its confluence, best seen on axial image # 8/2, coronal image # 36/4, and sagittal image # 30/5. While unusual in this location, focal hyperdense clot is difficult to  exclude. Asymmetric atherosclerotic calcification could result in a similar appearance. Skull: Intact Sinuses/Orbits: Bilateral maxillary antrostomy has been performed. Mild mucosal thickening is noted within the ethmoid air cells bilaterally. Remaining paranasal sinuses are clear. Orbits are unremarkable. Other: Mastoid air cells and middle ear cavities are clear. IMPRESSION: 1. Focal hyperdensity involving the basilar artery at its confluence. While unusual in this location, focal hyperdense clot is difficult to exclude. Asymmetric atherosclerotic calcification could result in a similar appearance. Correlation with neurologic examination is recommended. If there is concern for basilar artery thrombosis, CT arteriography is recommended for further evaluation. 2. 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 3. Mild periventricular white matter changes likely reflecting the sequela of small vessel ischemia. Electronically Signed: By: Fidela Salisbury MD On: 07/20/2020 16:14   US Abdomen Limited RUQ  Result Date: 07/20/2020 CLINICAL DATA:  Abdominal pain times 3-4 days. EXAM: ULTRASOUND ABDOMEN LIMITED RIGHT UPPER QUADRANT COMPARISON:  07/17/2020 FINDINGS: Gallbladder: There is gallbladder wall thickening with the gallbladder wall measuring up to approximately 6 mm. The sonographic Percell Miller sign is negative. There are gallstones. The gallbladder appears to be distended. Common bile duct: Diameter: 5 mm Liver: Diffuse increased echogenicity with slightly heterogeneous liver. Appearance typically secondary to fatty infiltration. Fibrosis secondary consideration. No secondary findings of cirrhosis  noted. No focal hepatic lesion or intrahepatic biliary duct dilatation. Portal vein is patent on color Doppler imaging with normal direction of blood flow towards the liver. Other: None. IMPRESSION: 1. Findings are again equivocal for acute cholecystitis. If there is high clinical suspicion, follow-up with HIDA scan is recommended. 2. Hepatic steatosis. Electronically Signed   By: Constance Holster M.D.   On: 07/20/2020 17:48   US ABDOMEN LIMITED RUQ  Result Date: 07/17/2020 CLINICAL DATA:  Right upper quadrant pain. EXAM: ULTRASOUND ABDOMEN LIMITED RIGHT UPPER QUADRANT COMPARISON:  None. FINDINGS: Gallbladder: Mobile gallstone demonstrated in the gallbladder measuring about 8 mm diameter. There is diffuse thickening of the gallbladder wall with mild pericholecystic fluid. Murphy's sign is negative. Common bile duct: Diameter: 6.7 mm, normal Liver: Diffusely increased and heterogeneous hepatic parenchymal echotexture, likely representing fatty infiltration. Portal vein is patent on color Doppler imaging with normal direction of blood flow towards the liver. Other: None. IMPRESSION: Cholelithiasis with gallbladder wall thickening and pericholecystic fluid. Negative Murphy's sign. Changes are nonspecific but may indicate acute cholecystitis in the appropriate clinical setting. Diffuse fatty infiltration of the liver. Electronically Signed   By: Lucienne Capers M.D.   On: 07/17/2020 01:54    Labs:  CBC: Recent Labs    01/27/20 0316 07/16/20 1820 07/20/20 1515 07/21/20 0427  WBC 14.2* 14.2* 20.7* 15.0*  HGB 16.9 17.1* 14.0 13.4  HCT 53.0* 51.8 43.9 41.2  PLT 266 228 202 190    COAGS: No results for input(s): INR, APTT in the last 8760 hours.  BMP: Recent Labs    01/28/20 0318 07/16/20 1820 07/20/20 1515 07/21/20 0427  NA 138 142 135 137  K 4.9 4.5 4.6 4.4  CL 105 102 101 104  CO2 28 29 25 25   GLUCOSE 127* 147* 114* 86  BUN 31* 16 17 19   CALCIUM 8.7* 10.2 9.2 8.7*  CREATININE 1.24  1.42* 1.31* 1.27*  GFRNONAA 56* 48* 53* 55*  GFRAA >60 55* >60 >60    LIVER FUNCTION TESTS: Recent Labs    01/22/20 1048 01/27/20 0316 07/16/20 1820 07/21/20 0427  BILITOT 1.2 0.7 1.0 2.9*  AST 18 39  12* 23  ALT 20 98* 17 34  ALKPHOS 67 70 72 77  PROT 6.8 6.6 7.8 6.1*  ALBUMIN 3.3* 3.2* 3.8 2.5*    TUMOR MARKERS: No results for input(s): AFPTM, CEA, CA199, CHROMGRNA in the last 8760 hours.  Assessment and Plan:  79 y/o M who presented to the ED with complaints of poor PO intake, weakness and fall x 2 - workup concerning for acute cholecystitis and deemed to be a poor surgical candidate seen today for possible percutaneous cholecystostomy placement in IR. Patient history and imaging reviewed by Dr. Vernard Gambles today who approves patient for procedure tomorrow (8/13).   Patient to be NPO at midnight, continue to hold anticoagulation until post procedure, AM INR and CBC pending, IR will call for patient when ready. Currently receiving Rocephin 2 g Q24H IV + Flagyl 500 mg Q8H per primary team. MRCP ordered by general surgery to r/o choledocholithiasis and is pending.   Risks and benefits discussed with the patient including, but not limited to bleeding, infection, gallbladder perforation, bile leak, sepsis or even death.  All of the patient's questions were answered, patient is agreeable to proceed.  Consent signed and in chart.  Orders placed for outpatient routine cholangiogram with possible exchange in about 6 weeks with IR - scheduler will call patient with appointment date time. Our contact information has also been placed in his AVS should he have questions or concerns prior to his appointment. He will need to be shown how to change dressing and flush cholecystostomy prior to d/c by RN - I have placed orders to facilitate this as well.  Thank you for this interesting consult.  I greatly enjoyed meeting Merwyn Clare and look forward to participating in their care.  A copy of this  report was sent to the requesting provider on this date.  Electronically Signed: Joaquim Nam, PA-C 07/21/2020, 10:14 AM   I spent a total of 40 Minutes  in face to face in clinical consultation, greater than 50% of which was counseling/coordinating care for percutaneous cholecystostomy placement.

## 2020-07-21 NOTE — Consult Note (Addendum)
[facility] SURGICAL CONSULTATION NOTE (initial) - cpt: 70350   HISTORY OF PRESENT ILLNESS (HPI):  79 y.o. male who initially presented to [facility] ED on 08/08 with reports of 24 hours of upper abdominal pain associated with nausea, emesis, and diarrhea. Work up at that time was concerning for mild leukocytosis and RUQ US showed cholelithiasis without evidence of cholecystitis. After discussion with Dr Hampton Abbot, he was sent home on Augmentin and close follow up in our office. Unfortunately, he never made it to that appointment. He presented again to [facility] ED yesterday (08/11) afternoon with new complaints of weakness. This has been progressively worsening over the course of the last few days, and ultimately resulted in what he described as a mechanical fall. He reportedly "sat down on the floor in the bathroom" but did not hit his head. No LOC. He is on Xarelto secondary to history of Afib with RVR. He reports that his abdominal pain from his previous presentation has resolved along with the N/V/D. Only major associated symptoms is decreased PO intake. No fever, chills, cough, CP, SOB, bowel changes, or urinary changes. No juandice, orange colored urine, or acholic stools. Only previous abdominal surgery was an appendectomy. Work up in the ED yesterday revealed worsening leukocytosis to 20.7K (which is improved to 15K this morning), AKI with sCr - 1.31 (which is also improved to 1.27 this morning), and he was found to have new hyperbilirubinemia to 2.9 this morning on labs which was not seen on 08/07. RUQ [place] was again equivocal for cholecystitis.   Surgery is consulted by emergency medicine physician Dr. Gilberto Better, MD in this context for evaluation and management of acute cholecystitis.  PAST MEDICAL HISTORY (PMH):  Past Medical History:  Diagnosis Date   Basal cell carcinoma 08/24/2014   chin, L nose ant alar crease   Cancer (HCC)    skin   Emphysema of lung ([employer])    Hypertension     PVD (peripheral vascular disease) ([place])    Squamous cell carcinoma of skin 09/07/2014, 03/23/2020   L dorsal hand   Squamous cell carcinoma of skin 01/17/2015   R prox lateral dorsum of hand     PAST SURGICAL HISTORY ([place]):  Past Surgical History:  Procedure Laterality Date   APPENDECTOMY     arm surgery     ELECTROPHYSIOLOGIC STUDY N/A 11/20/2016   Procedure: CARDIOVERSION;  Surgeon: Dionisio David, MD;  Location: [facility] ORS;  Service: Cardiovascular;  Laterality: N/A;     MEDICATIONS:  Prior to Admission medications   Medication Sig Start Date End Date Taking? Authorizing Provider  ADVAIR HFA 230-21 MCG/ACT inhaler Inhale 1 puff into the lungs 2 (two) times daily as needed.  12/17/19  Yes [provider]  albuterol (VENTOLIN HFA) 108 (90 Base) MCG/ACT inhaler Inhale 2 puffs into the lungs every 6 (six) hours as needed for wheezing or shortness of breath. 12/21/19  Yes Vanessa [place], MD  amiodarone (PACERONE) 200 MG tablet Take 2 tablets (400 mg total) by mouth 2 (two) times daily. 01/28/20  Yes Loletha Grayer, MD  amoxicillin-clavulanate (AUGMENTIN) 875-125 MG tablet Take 1 tablet by mouth 2 (two) times daily for 10 days. 07/17/20 07/27/20 Yes Paulette Blanch, MD  digoxin (LANOXIN) 0.125 MG tablet Take 1 tablet (0.125 mg total) by mouth every other day. 01/28/20  Yes Wieting, Richard, MD  metoprolol succinate (TOPROL-XL) 50 MG 24 hr tablet Take 1 tablet (50 mg total) by mouth in the morning and at bedtime. Take  with or immediately following a meal. 01/28/20  Yes Wieting, Richard, MD  ondansetron (ZOFRAN ODT) 4 MG disintegrating tablet Take 1 tablet (4 mg total) by mouth every 8 (eight) hours as needed for nausea or vomiting. 07/17/20  Yes Paulette Blanch, MD  Rivaroxaban (XARELTO) 15 MG TABS tablet Take 15 mg by mouth daily with supper.   Yes [provider]  sacubitril-valsartan (ENTRESTO) 49-51 MG Take 1 tablet by mouth 2 (two) times daily.   Yes [provider]   torsemide (DEMADEX) 20 MG tablet Take 20 mg by mouth daily.   Yes [provider]  traMADol (ULTRAM) 50 MG tablet Take 1 tablet (50 mg total) by mouth every 6 (six) hours as needed. 07/17/20  Yes Paulette Blanch, MD     ALLERGIES:  Allergies  Allergen Reactions   Lipitor [Atorvastatin Calcium] Other (See Comments)    arthralgia     SOCIAL HISTORY:  Social History   Socioeconomic History   Marital status: Married    Spouse name: Not on file   Number of children: Not on file   Years of education: Not on file   Highest education level: Not on file  Occupational History   Not on file  Tobacco Use   Smoking status: Current Every Day Smoker    Packs/day: 0.00    Types: Cigarettes   Smokeless tobacco: Never Used  Substance and Sexual Activity   Alcohol use: Not Currently    Comment: quit 1979; 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   Drug use: Never   Sexual activity: Not on file  Other Topics Concern   Not on file  Social History Narrative   Not on file   Social Determinants of Health   Financial Resource Strain:    Difficulty of Paying Living Expenses:   Food Insecurity:    Worried About Charity fundraiser in the Last Year:    Arboriculturist in the Last Year:   Transportation Needs:    Film/video editor (Medical):    Lack of Transportation (Non-Medical):   Physical Activity:    Days of Exercise per Week:    Minutes of Exercise per Session:   Stress:    Feeling of Stress :   Social Connections:    Frequency of Communication with Friends and Family:    Frequency of Social Gatherings with Friends and Family:    Attends Religious Services:    Active Member of Clubs or Organizations:    Attends Music therapist:    Marital Status:   Intimate Partner Violence:    Fear of Current or Ex-Partner:    Emotionally Abused:    Physically Abused:    Sexually Abused:      FAMILY HISTORY:  History reviewed. No pertinent family history.    REVIEW OF  SYSTEMS:  Review of Systems  Constitutional: Negative for chills and fever.       + Decreased PO Intake   HENT: Negative for congestion and sore throat.   Respiratory: Negative for cough and shortness of breath.   Cardiovascular: Negative for chest pain and palpitations.  Gastrointestinal: Negative for abdominal pain, constipation, diarrhea, nausea and vomiting.  Genitourinary: Negative for dysuria and urgency.  Musculoskeletal: Positive for falls.  Neurological: Positive for weakness. Negative for dizziness, loss of consciousness and headaches.  All other systems reviewed and are negative.   VITAL SIGNS:  Temp:  [98.4 F (36.9 C)-98.6 F (37 C)] 98.4 F (36.9 C) (  08/12 0518) Pulse Rate:  [95-126] 95 (08/12 0518) Resp:  [18-25] 20 (08/12 0518) BP: (85-115)/(51-67) 100/67 (08/12 0518) SpO2:  [88 %-95 %] 90 % (08/12 0518) Weight:  [131 kg-140.7 kg] 140.7 kg (08/11 2202)     Height: 6\' 1"  (185.4 cm) Weight: (!) 140.7 kg BMI (Calculated): 40.93   INTAKE/OUTPUT:  08/11 0701 - 08/12 0700 In: 1600.2 [I.V.:500.2; IV Piggyback:1100] Out: 250 [Urine:250]  PHYSICAL EXAM:  Physical Exam Vitals and nursing note reviewed. Exam conducted with a chaperone present.  Constitutional:      General: He is not in acute distress.    Appearance: He is well-developed. He is obese. He is not ill-appearing.     Comments: Appears uncomfortable, somewhat somnolent   HENT:     Head: Normocephalic and atraumatic.  Eyes:     General: Scleral icterus (Mild) present.     Extraocular Movements: Extraocular movements intact.  Cardiovascular:     Rate and Rhythm: Tachycardia present. Rhythm irregularly irregular.     Pulses:          Radial pulses are 2+ on the right side and 2+ on the left side.       Dorsalis pedis pulses are 2+ on the right side and 2+ on the left side.  Pulmonary:     Effort: Pulmonary effort is normal. No respiratory distress.     Breath sounds: Normal breath sounds.     Comments:  On [place] Abdominal:     General: Abdomen is protuberant. A surgical scar is present. There is no distension.     Palpations: Abdomen is soft.     Tenderness: There is abdominal tenderness in the right upper quadrant. There is no guarding or rebound. Positive signs include Murphy's sign.  Genitourinary:    Comments: Deferred Skin:    General: Skin is warm and dry.     Coloration: Skin is not jaundiced or pale.  Neurological:     General: No focal deficit present.     Mental Status: He is alert and oriented to person, place, and time.  Psychiatric:        Mood and Affect: Mood normal.        Behavior: Behavior normal.      Labs:  CBC Latest Ref Rng & Units 07/21/2020 07/20/2020 07/16/2020  WBC 4.0 - 10.5 K/uL 15.0(H) 20.7(H) 14.2(H)  Hemoglobin 13.0 - 17.0 g/dL 13.4 14.0 17.1(H)  Hematocrit 39 - 52 % 41.2 43.9 51.8  Platelets 150 - 400 K/uL 190 202 228   CMP Latest Ref Rng & Units 07/21/2020 07/20/2020 07/16/2020  Glucose 70 - 99 mg/dL 86 114(H) 147(H)  BUN 8 - 23 mg/dL 19 17 16   Creatinine 0.61 - 1.24 mg/dL 1.27(H) 1.31(H) 1.42(H)  Sodium 135 - 145 mmol/L 137 135 142  Potassium 3.5 - 5.1 mmol/L 4.4 4.6 4.5  Chloride 98 - 111 mmol/L 104 101 102  CO2 22 - 32 mmol/L 25 25 29   Calcium 8.9 - 10.3 mg/dL 8.7(L) 9.2 10.2  Total Protein 6.5 - 8.1 g/dL 6.1(L) - 7.8  Total Bilirubin 0.3 - 1.2 mg/dL 2.9(H) - 1.0  Alkaline Phos 38 - 126 U/L 77 - 72  AST 15 - 41 U/L 23 - 12(L)  ALT 0 - 44 U/L 34 - 17     Imaging studies:   RUQ [place] (07/17/2020) personally reviewed which shows cholelithiasis with some gallbladder wall thickening, no pericholecystic fluid, equivocal for cholecystitis, and radiologist report reviewed:  IMPRESSION: Cholelithiasis with gallbladder wall  thickening and pericholecystic fluid. Negative Murphy's sign. Changes are nonspecific but may indicate acute cholecystitis in the appropriate clinical setting. Diffuse fatty infiltration of the liver.  RUQ [place] (07/20/2020)  personally reviewed and is relatively unchanged from previous study, equivocal for cholecystitis, and radiologist report reviewed:  IMPRESSION: 1. Findings are again equivocal for acute cholecystitis. If there is high clinical suspicion, follow-up with HIDA scan is recommended. 2. Hepatic steatosis.   Assessment/Plan: (ICD-10's: K81.0) 79 y.o. male with improving leukocytosis but new hyperbilirubinemia admitted with sepsis presumably from cholecystitis with positive Murphy's sign on my examination, complicated by pertinent comorbidities including debilitation and Afib with RVR requiring anticoagulation.   - Recommend obtaining MRCP to rule out choledocholithiasis given new hyperbilirubinemia found this morning; I will order this  - Patient is poor surgical candidate in this setting given his debilitation, comorbidities, and need for anticoagulation. As such, recommend consulting with IR for placement of percutaneous cholecystostomy tube ; hold anticoagulation until cholecystostomy tube in place.              - Recommend remains NPO  - Continue IVF resuscitation   - Continue IV ABx (Rocephin + Flagyl)  - Monitor abdominal examination  - Monitor leukocytosis, renal function, bilirubin levels  - Further management per primary service; we will follow   All of the above findings and recommendations were discussed with the patient, and all of patient's questions were answered to his expressed satisfaction.  Thank you for the opportunity to participate in this patient's care.   -- Edison Simon, PA-C [facility] 07/21/2020, 8:23 AM [phone] M-F: 7am - 4pm  Patient at MRCP scan when I attempted to see him.  I agree with the above documentation and plan, which I have edited where appropriate.

## 2020-07-21 NOTE — Care Management Important Message (Signed)
Important Message  Patient Details  Name: Stanley Marks MRN: [id number] Date of Birth: [birth year]   Medicare Important Message Given:  Yes   Initial Medicare IM given by Patient Access Associate on 07/21/2020 at 10:37am.     Dannette Barbara 07/21/2020, 2:24 PM

## 2020-07-22 ENCOUNTER — Inpatient Hospital Stay: Payer: Medicare Other

## 2020-07-22 ENCOUNTER — Ambulatory Visit: Payer: Medicare Other | Admitting: Surgery

## 2020-07-22 DIAGNOSIS — K8000 Calculus of gallbladder with acute cholecystitis without obstruction: Secondary | ICD-10-CM | POA: Diagnosis not present

## 2020-07-22 HISTORY — PX: IR PERC CHOLECYSTOSTOMY: IMG2326

## 2020-07-22 LAB — COMPREHENSIVE METABOLIC PANEL
ALT: 36 U/L (ref 0–44)
AST: 28 U/L (ref 15–41)
Albumin: 2.5 g/dL — ABNORMAL LOW (ref 3.5–5.0)
Alkaline Phosphatase: 119 U/L (ref 38–126)
Anion gap: 10 (ref 5–15)
BUN: 18 mg/dL (ref 8–23)
CO2: 24 mmol/L (ref 22–32)
Calcium: 8.6 mg/dL — ABNORMAL LOW (ref 8.9–10.3)
Chloride: 105 mmol/L (ref 98–111)
Creatinine, Ser: 0.92 mg/dL (ref 0.61–1.24)
GFR calc Af Amer: >60 mL/min (ref >60)
GFR calc non Af Amer: >60 mL/min (ref >60)
Glucose, Bld: 113 mg/dL — ABNORMAL HIGH (ref 70–99)
Potassium: 4.1 mmol/L (ref 3.5–5.1)
Sodium: 139 mmol/L (ref 135–145)
Total Bilirubin: 2.8 mg/dL — ABNORMAL HIGH (ref 0.3–1.2)
Total Protein: 6.3 g/dL — ABNORMAL LOW (ref 6.5–8.1)

## 2020-07-22 LAB — CBC
HCT: 41.2 % (ref 39.0–52.0)
Hemoglobin: 13.1 g/dL (ref 13.0–17.0)
MCH: 30.4 pg (ref 26.0–34.0)
MCHC: 31.8 g/dL (ref 30.0–36.0)
MCV: 95.6 fL (ref 80.0–100.0)
Platelets: 221 10*3/uL (ref 150–400)
RBC: 4.31 MIL/uL (ref 4.22–5.81)
RDW: 17.3 % — ABNORMAL HIGH (ref 11.5–15.5)
WBC: 13.3 10*3/uL — ABNORMAL HIGH (ref 4.0–10.5)
nRBC: 0 % (ref 0.0–0.2)

## 2020-07-22 LAB — PROTIME-INR
INR: 1.2 (ref 0.8–1.2)
Prothrombin Time: 14.7 seconds (ref 11.4–15.2)

## 2020-07-22 MED ORDER — FENTANYL CITRATE (PF) 100 MCG/2ML IJ SOLN
INTRAMUSCULAR | Status: AC
  Filled 2020-07-22: qty 2

## 2020-07-22 MED ORDER — HYDROCODONE-ACETAMINOPHEN 5-325 MG PO TABS
1.0000 | ORAL_TABLET | ORAL | Status: DC | PRN
  Administered 2020-07-22 – 2020-07-23 (×3): 1 via ORAL
  Administered 2020-07-23: 2 via ORAL
  Administered 2020-07-23 – 2020-07-24 (×2): 1 via ORAL
  Filled 2020-07-22 (×3): qty 1
  Filled 2020-07-22: qty 2
  Filled 2020-07-22 (×2): qty 1

## 2020-07-22 MED ORDER — SODIUM CHLORIDE 0.9 % IV SOLN
INTRAVENOUS | Status: DC | PRN
  Administered 2020-07-22: 1000 mL via INTRAVENOUS

## 2020-07-22 MED ORDER — MIDAZOLAM HCL 2 MG/2ML IJ SOLN
INTRAMUSCULAR | Status: AC
  Filled 2020-07-22: qty 2

## 2020-07-22 MED ORDER — FENTANYL CITRATE (PF) 100 MCG/2ML IJ SOLN
INTRAMUSCULAR | Status: AC | PRN
  Administered 2020-07-22: 50 ug via INTRAVENOUS

## 2020-07-22 MED ORDER — IODIXANOL 320 MG/ML IV SOLN
50.0000 mL | Freq: Once | INTRAVENOUS | Status: AC
  Administered 2020-07-22: 50 mL

## 2020-07-22 MED ORDER — SODIUM CHLORIDE 0.9% FLUSH
5.0000 mL | Freq: Three times a day (TID) | INTRAVENOUS | Status: DC
  Administered 2020-07-22 – 2020-07-24 (×6): 5 mL

## 2020-07-22 MED ORDER — ONDANSETRON HCL 4 MG/2ML IJ SOLN
4.0000 mg | Freq: Four times a day (QID) | INTRAMUSCULAR | Status: DC | PRN
  Administered 2020-07-22: 4 mg via INTRAVENOUS
  Filled 2020-07-22: qty 2

## 2020-07-22 MED ORDER — MIDAZOLAM HCL 2 MG/2ML IJ SOLN
INTRAMUSCULAR | Status: AC | PRN
  Administered 2020-07-22: 1 mg via INTRAVENOUS

## 2020-07-22 NOTE — Progress Notes (Signed)
Patient clinically stable post Cholcystomy tube placement 10FR per Dr Vernard Gambles.tolerated well. States some discomfort 5/10 pain post procedure however unchanged from prior. Vitals remain stable from pre procedure. Report given to [facility] RN on [place] with questions answered. Upon arrival to room post recovery, Minette Brine Rn present with update given. Patient to receive pain meds previously ordered prior to procedure.

## 2020-07-22 NOTE — Procedures (Signed)
  Procedure: Perc cholecystostomy catheter placement 24f EBL:   minimal Complications:  none immediate  See full dictation in [facility].  Dillard Cannon MD Main # [phone] Pager  [phone]

## 2020-07-22 NOTE — Progress Notes (Signed)
PROGRESS NOTE    Stanley Marks  JAS:[id number] DOB: February 12, 1941 DOA: 07/20/2020 PCP: Perrin Maltese, MD    Brief Narrative:  79 year old gentleman with extensive medical issues including chronic systolic congestive heart failure, A. fib on Xarelto, hypertension, peripheral vascular disease, COPD, obstructive sleep apnea presented to the emergency room with weakness and fall.  Recently seen at the emergency room with cholelithiasis concerning for cholecystitis.  He has been feeling weak and not able to eat much so came back to the emergency room.  In the emergency room he is found to have leukocytosis.  Abdominal ultrasound again showed equivocal for acute cholecystitis.  Admitted with surgical consultation. MRCP with no bile duct abnormalities. 8/13, IR guided percutaneous cholecystostomy.   Assessment & Plan:   Principal Problem:   Acute calculous cholecystitis Active Problems:   Chronic systolic CHF (congestive heart failure) (HCC)   COPD with chronic bronchitis (HCC)   Atrial fibrillation, chronic (HCC)  Sepsis present on admission, ruled in due to acute calculus cholecystitis: MRCP ruled out choledocholithiasis.   On Rocephin and Flagyl.   Seen and followed by surgery.  Hemodynamically stable.   Given extensive medical problems recommended minimally invasive therapy and underwent percutaneous nephrostomy tube placement today by IR.   Monitor today in the hospital.  Adequate pain medications.  Continue antibiotics.   Tentative plan to go home with cholecystostomy tube with IR follow-up and surgery follow-up tomorrow.   Bilirubin mildly elevated but stable.  Recheck levels tomorrow morning.  Acute kidney injury: Improving with IV fluids.  Chronic atrial fibrillation: Rate controlled.  Holding Xarelto.  Continue on amiodarone digoxin and metoprolol.  Probably can resume Xarelto on discharge tomorrow.  COPD: With chronic hypoxic failure.  On albuterol as needed.  No evidence of  exacerbation.  Chronic systolic congestive heart failure: Currently euvolemic.  Entresto on hold.  Stable.  Resume on discharge.  Start mobilizing.  PT OT consult.  Possible home tomorrow.   DVT prophylaxis: SCDs Start: 07/20/20 2055   Code Status: Full code Family Communication: Patient's granddaughter on the phone. Disposition Plan: Status is: Inpatient  Remains inpatient appropriate because:IV treatments appropriate due to intensity of illness or inability to take PO and Inpatient level of care appropriate due to severity of illness   Dispo: The patient is from: Home              Anticipated d/c is to: Home              Anticipated d/c date is: 2 days              Patient currently is not medically stable to d/c.         Consultants:   General surgery  Interventional radiology  Procedures:   Cholecystostomy 8/13  Antimicrobials:  Anti-infectives (From admission, onward)   Start     Dose/Rate Route Frequency Ordered Stop   07/21/20 1900  cefTRIAXone (ROCEPHIN) 2 g in sodium chloride 0.9 % 100 mL IVPB     Discontinue     2 g 200 mL/hr over 30 Minutes Intravenous Every 24 hours 07/20/20 2059     07/20/20 2200  metroNIDAZOLE (FLAGYL) IVPB 500 mg     Discontinue     500 mg 100 mL/hr over 60 Minutes Intravenous Every 8 hours 07/20/20 2059     07/20/20 2100  cefTRIAXone (ROCEPHIN) 1 g in sodium chloride 0.9 % 100 mL IVPB  Status:  Discontinued        1 g  200 mL/hr over 30 Minutes Intravenous Every 24 hours 07/20/20 2059 07/20/20 2059   07/20/20 1815  cefTRIAXone (ROCEPHIN) 1 g in sodium chloride 0.9 % 100 mL IVPB  Status:  Discontinued        1 g 200 mL/hr over 30 Minutes Intravenous  Once 07/20/20 1812 07/20/20 1814   07/20/20 1815  cefTRIAXone (ROCEPHIN) 2 g in sodium chloride 0.9 % 100 mL IVPB        2 g 200 mL/hr over 30 Minutes Intravenous  Once 07/20/20 1814 07/20/20 2122         Subjective: Patient seen and examined.  Came back from procedure.   Complains of pain on the right upper quadrant where the tube is.  Wants to know when he can go home and when I am going to come back to see him tomorrow morning.  Objective: Vitals:   07/22/20 1245 07/22/20 1300 07/22/20 1353 07/22/20 1456  BP: (!) 162/92  (!) 143/96 (!) 150/87  Pulse:   (!) 112 (!) 104  Resp: (!) 29 (!) 29  (!) 28  Temp:   98.2 F (36.8 C) 98.2 F (36.8 C)  TempSrc:   Oral Oral  SpO2: 92%  91% 93%  Weight:      Height:        Intake/Output Summary (Last 24 hours) at 07/22/2020 1628 Last data filed at 07/22/2020 1500 Gross per 24 hour  Intake 843.11 ml  Output 1100 ml  Net -256.89 ml   Filed Weights   07/20/20 1509 07/20/20 2202  Weight: 131 kg (!) 140.7 kg    Examination:  General exam: Chronically sick looking gentleman on 2 L oxygen.  Not in distress but anxious. Respiratory system: Clear to auscultation. Respiratory effort normal. Cardiovascular system: S1 & S2 heard, irregularly irregular.   Gastrointestinal system: Abdomen soft and nontender.  Cholecystostomy tube with bile draining.   Central nervous system: Alert and oriented. No focal neurological deficits. Extremities: Symmetric 5 x 5 power. Skin: No rashes, lesions or ulcers Psychiatry: Judgement and insight appear normal. Mood & affect appropriate.     Data Reviewed: I have personally reviewed following labs and imaging studies  CBC: Recent Labs  Lab 07/16/20 1820 07/20/20 1515 07/21/20 0427 07/22/20 0548  WBC 14.2* 20.7* 15.0* 13.3*  HGB 17.1* 14.0 13.4 13.1  HCT 51.8 43.9 41.2 41.2  MCV 91.4 94.6 93.0 95.6  PLT 228 202 190 063   Basic Metabolic Panel: Recent Labs  Lab 07/16/20 1820 07/20/20 1515 07/21/20 0427 07/22/20 0548  NA 142 135 137 139  K 4.5 4.6 4.4 4.1  CL 102 101 104 105  CO2 29 25 25 24   GLUCOSE 147* 114* 86 113*  BUN 16 17 19 18   CREATININE 1.42* 1.31* 1.27* 0.92  CALCIUM 10.2 9.2 8.7* 8.6*   GFR: Estimated Creatinine Clearance: 100.7 mL/min (by C-G  formula based on SCr of 0.92 mg/dL). Liver Function Tests: Recent Labs  Lab 07/16/20 1820 07/21/20 0427 07/22/20 0548  AST 12* 23 28  ALT 17 34 36  ALKPHOS 72 77 119  BILITOT 1.0 2.9* 2.8*  PROT 7.8 6.1* 6.3*  ALBUMIN 3.8 2.5* 2.5*   Recent Labs  Lab 07/16/20 1820  LIPASE 34   No results for input(s): AMMONIA in the last 168 hours. Coagulation Profile: Recent Labs  Lab 07/22/20 0548  INR 1.2   Cardiac Enzymes: No results for input(s): CKTOTAL, CKMB, CKMBINDEX, TROPONINI in the last 168 hours. BNP (last 3 results) No results for  input(s): PROBNP in the last 8760 hours. HbA1C: No results for input(s): HGBA1C in the last 72 hours. CBG: No results for input(s): GLUCAP in the last 168 hours. Lipid Profile: No results for input(s): CHOL, HDL, LDLCALC, TRIG, CHOLHDL, LDLDIRECT in the last 72 hours. Thyroid Function Tests: No results for input(s): TSH, T4TOTAL, FREET4, T3FREE, THYROIDAB in the last 72 hours. Anemia Panel: No results for input(s): VITAMINB12, FOLATE, FERRITIN, TIBC, IRON, RETICCTPCT in the last 72 hours. Sepsis Labs: No results for input(s): PROCALCITON, LATICACIDVEN in the last 168 hours.  Recent Results (from the past 240 hour(s))  Blood culture (routine x 2)     Status: None (Preliminary result)   Collection Time: 07/20/20  6:57 PM   Specimen: BLOOD  Result Value Ref Range Status   Specimen Description BLOOD LEFT ANTECUBITAL  Final   Special Requests   Final    BOTTLES DRAWN AEROBIC AND ANAEROBIC Blood Culture results may not be optimal due to an inadequate volume of blood received in culture bottles   Culture   Final    NO GROWTH 2 DAYS Performed at [facility], [address] [place], [place] [address]    Report Status PENDING  Incomplete  Blood culture (routine x 2)     Status: None (Preliminary result)   Collection Time: 07/20/20  6:57 PM   Specimen: BLOOD  Result Value Ref Range Status   Specimen Description BLOOD RIGHT  ANTECUBITAL  Final   Special Requests   Final    BOTTLES DRAWN AEROBIC AND ANAEROBIC Blood Culture adequate volume   Culture   Final    NO GROWTH 2 DAYS Performed at [facility], [address] [place], [place] [address]    Report Status PENDING  Incomplete  SARS Coronavirus 2 by RT PCR (hospital order, performed in [place] hospital lab) Nasopharyngeal Nasopharyngeal Swab     Status: None   Collection Time: 07/20/20  7:30 PM   Specimen: Nasopharyngeal Swab  Result Value Ref Range Status   SARS Coronavirus 2 NEGATIVE NEGATIVE Final    Comment: (NOTE) SARS-CoV-2 target nucleic acids are NOT DETECTED.  The SARS-CoV-2 RNA is generally detectable in upper and lower respiratory specimens during the acute phase of infection. The lowest concentration of SARS-CoV-2 viral copies this assay can detect is 250 copies / mL. A negative result does not preclude SARS-CoV-2 infection and should not be used as the sole basis for treatment or other patient management decisions.  A negative result may occur with improper specimen collection / handling, submission of specimen other than nasopharyngeal swab, presence of viral mutation(s) within the areas targeted by this assay, and inadequate number of viral copies (<250 copies / mL). A negative result must be combined with clinical observations, patient history, and epidemiological information.  Fact Sheet for Patients:   StrictlyIdeas.no  Fact Sheet for Healthcare Providers: BankingDealers.co.za  This test is not yet approved or  cleared by the [place] FDA and has been authorized for detection and/or diagnosis of SARS-CoV-2 by FDA under an Emergency Use Authorization (EUA).  This EUA will remain in effect (meaning this test can be used) for the duration of the COVID-19 declaration under Section 564(b)(1) of the Act, 21 U.S.C. section 360bbb-3(b)(1), unless the authorization is  terminated or revoked sooner.  Performed at [facility], [address] [place], [place] [address]          Radiology Studies: [place] Intraoperative  Result Date: 07/22/2020 CLINICAL DATA:  Ultrasound was  provided for use by the ordering physician, and a technical charge was applied by the performing facility.  No radiologist interpretation/professional services rendered.   MR ABDOMEN MRCP WO CONTRAST  Result Date: 07/21/2020 CLINICAL DATA:  Right upper quadrant pain. Nondiagnostic ultrasound. Elevated bilirubin. Rule out choledocholithiasis. EXAM: MRI ABDOMEN WITHOUT CONTRAST  (INCLUDING MRCP) TECHNIQUE: Multiplanar multisequence MR imaging of the abdomen was performed. Heavily T2-weighted images of the biliary and pancreatic ducts were obtained, and three-dimensional MRCP images were rendered by post processing. COMPARISON:  Abdominal ultrasound 1121. FINDINGS: Exam is degraded by mild to moderate motion and lack of IV contrast. Lower chest: Moderate cardiomegaly. Trace pleural fluid is likely physiologic. Hepatobiliary: Mild hepatic steatosis. No suspicious liver lesion. No intrahepatic biliary duct dilatation. The gallbladder is mildly distended. Small stones within. Gallbladder wall thickening and irregularity, including on 26/7. Mild pericholecystic edema. The common duct measures on the order of 4 mm on 41/13, normal. No choledocholithiasis given motion degradation. Pancreas:  Normal, without mass or ductal dilatation. Spleen:  Normal in size, without focal abnormality. Adrenals/Urinary Tract: Normal right adrenal gland. Mild left adrenal thickening and nodularity, likely due to an adenoma 2.1 cm on 21/4. T2 hyperintense bilateral renal lesions are likely cysts but incompletely characterized. A posterior interpolar right renal T2 hypointense 1.3 cm lesion on 13/2 is mildly heterogeneous T1 signal on 68/16. Similar in size on the CT of 03/05/2019. No hydronephrosis. Stomach/Bowel:  Normal stomach and abdominal bowel loops. Vascular/Lymphatic: The infrarenal aorta is tortuous, dilated at up to 3.2 cm on 21/2. Prominent nodes including a porta hepatis node of 1.2 cm on 19/7. Similar to 03/05/2019. 7 mm right retrocrural node on this image. Aortocaval index node of 1.1 cm on 31/7. Other: Nonspecific mesenteric edema with anasarca, possibly representing fluid overload. Musculoskeletal: Convex left lumbar spine curvature. IMPRESSION: 1. Degraded exam secondary to motion and lack of IV contrast. 2. Findings suspicious for acute cholecystitis. No choledocholithiasis or biliary duct dilatation. 3. 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 Recommend follow-up every 3 years. This recommendation follows ACR consensus guidelines: White Paper of the ACR Incidental Findings Committee II on Vascular Findings. J Am Coll Radiol 2013; 10:789-794. 4. Prominent but not pathologically enlarged upper abdominal nodes are most likely reactive in the setting of steatosis. 5. Right renal lesion is similar in size to 03/05/2019, incompletely characterized. Most likely a hemorrhagic cyst. Solid neoplasm such as papillary type renal cell carcinoma cannot be excluded. Consider renal ultrasound to allow surveillance. Alternatively, if the patient can undergo pre and postcontrast CT or MRI, this should be considered (when patient is able to follow directions and breath hold). Electronically Signed   By: Abigail Miyamoto M.D.   On: 07/21/2020 14:32   MR 3D Recon At Scanner  Result Date: 07/21/2020 CLINICAL DATA:  Right upper quadrant pain. Nondiagnostic ultrasound. Elevated bilirubin. Rule out choledocholithiasis. EXAM: MRI ABDOMEN WITHOUT CONTRAST  (INCLUDING MRCP) TECHNIQUE: Multiplanar multisequence MR imaging of the abdomen was performed. Heavily T2-weighted images of the biliary and pancreatic ducts were obtained, and three-dimensional MRCP images were rendered by post processing. COMPARISON:  Abdominal ultrasound  1121. FINDINGS: Exam is degraded by mild to moderate motion and lack of IV contrast. Lower chest: Moderate cardiomegaly. Trace pleural fluid is likely physiologic. Hepatobiliary: Mild hepatic steatosis. No suspicious liver lesion. No intrahepatic biliary duct dilatation. The gallbladder is mildly distended. Small stones within. Gallbladder wall thickening and irregularity, including on 26/7. Mild pericholecystic edema. The common duct measures on the order of 4  mm on 41/13, normal. No choledocholithiasis given motion degradation. Pancreas:  Normal, without mass or ductal dilatation. Spleen:  Normal in size, without focal abnormality. Adrenals/Urinary Tract: Normal right adrenal gland. Mild left adrenal thickening and nodularity, likely due to an adenoma 2.1 cm on 21/4. T2 hyperintense bilateral renal lesions are likely cysts but incompletely characterized. A posterior interpolar right renal T2 hypointense 1.3 cm lesion on 13/2 is mildly heterogeneous T1 signal on 68/16. Similar in size on the CT of 03/05/2019. No hydronephrosis. Stomach/Bowel: Normal stomach and abdominal bowel loops. Vascular/Lymphatic: The infrarenal aorta is tortuous, dilated at up to 3.2 cm on 21/2. Prominent nodes including a porta hepatis node of 1.2 cm on 19/7. Similar to 03/05/2019. 7 mm right retrocrural node on this image. Aortocaval index node of 1.1 cm on 31/7. Other: Nonspecific mesenteric edema with anasarca, possibly representing fluid overload. Musculoskeletal: Convex left lumbar spine curvature. IMPRESSION: 1. Degraded exam secondary to motion and lack of IV contrast. 2. Findings suspicious for acute cholecystitis. No choledocholithiasis or biliary duct dilatation. 3. 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 Recommend follow-up every 3 years. This recommendation follows ACR consensus guidelines: White Paper of the ACR Incidental Findings Committee II on Vascular Findings. J Am Coll Radiol 2013; 10:789-794. 4. Prominent but not  pathologically enlarged upper abdominal nodes are most likely reactive in the setting of steatosis. 5. Right renal lesion is similar in size to 03/05/2019, incompletely characterized. Most likely a hemorrhagic cyst. Solid neoplasm such as papillary type renal cell carcinoma cannot be excluded. Consider renal ultrasound to allow surveillance. Alternatively, if the patient can undergo pre and postcontrast CT or MRI, this should be considered (when patient is able to follow directions and breath hold). Electronically Signed   By: Abigail Miyamoto M.D.   On: 07/21/2020 14:32   IR Perc Cholecystostomy  Result Date: 07/22/2020 CLINICAL DATA:  Acute calculus cholecystitis EXAM: PERCUTANEOUS CHOLECYSTOSTOMY TUBE PLACEMENT WITH ULTRASOUND AND FLUOROSCOPIC GUIDANCE FLUOROSCOPY TIME:  0.5 minute; 7.89 mGy TECHNIQUE: The procedure, risks (including but not limited to bleeding, infection, organ damage ), benefits, and alternatives were explained to the patient. Questions regarding the procedure were encouraged and answered. The patient understands and consents to the procedure. Survey ultrasound of the abdomen was performed and an appropriate skin entry site was identified. Skin site was marked, prepped with Betadine, and draped in usual sterile fashion, and infiltrated locally with 1% lidocaine. Intravenous Fentanyl 73mcg and Versed 1mg  were administered as conscious sedation during continuous monitoring of the patient's level of consciousness and physiological / cardiorespiratory status by the radiology RN, with a total moderate sedation time of 29 minutes. Under real-time ultrasound guidance, gallbladder was accessed using a transhepatic approach with a 21-gauge needle. Ultrasound image documentation was saved. Bile returned through the hub. Needle was exchanged over a 018 guidewire for transitional dilator which allowed placement of 035 J wire. Over this, a 10.2 French pigtail catheter was advanced and formed centrally in  the gallbladder lumen. 20 mL purulent bile removed. Small contrast injection confirmed appropriate position. Catheter secured externally with 0 Prolene suture and placed external drain bag. A sample of the aspirate sent for Gram stain and culture. Patient tolerated the procedure well. COMPLICATIONS: COMPLICATIONS none IMPRESSION: 1. Technically successful percutaneous cholecystostomy tube placement with ultrasound and fluoroscopic guidance. Electronically Signed   By: Lucrezia Europe M.D.   On: 07/22/2020 13:40   US Abdomen Limited RUQ  Result Date: 07/20/2020 CLINICAL DATA:  Abdominal pain times 3-4 days. EXAM: ULTRASOUND  ABDOMEN LIMITED RIGHT UPPER QUADRANT COMPARISON:  07/17/2020 FINDINGS: Gallbladder: There is gallbladder wall thickening with the gallbladder wall measuring up to approximately 6 mm. The sonographic Percell Miller sign is negative. There are gallstones. The gallbladder appears to be distended. Common bile duct: Diameter: 5 mm Liver: Diffuse increased echogenicity with slightly heterogeneous liver. Appearance typically secondary to fatty infiltration. Fibrosis secondary consideration. No secondary findings of cirrhosis noted. No focal hepatic lesion or intrahepatic biliary duct dilatation. Portal vein is patent on color Doppler imaging with normal direction of blood flow towards the liver. Other: None. IMPRESSION: 1. Findings are again equivocal for acute cholecystitis. If there is high clinical suspicion, follow-up with HIDA scan is recommended. 2. Hepatic steatosis. Electronically Signed   By: Constance Holster M.D.   On: 07/20/2020 17:48        Scheduled Meds: . amiodarone  400 mg Oral BID  . digoxin  0.125 mg Oral QODAY  . metoprolol succinate  50 mg Oral Daily  . sodium chloride flush  5 mL Intracatheter Q8H  . torsemide  20 mg Oral Daily   Continuous Infusions: . sodium chloride 10 mL/hr at 07/22/20 1500  . cefTRIAXone (ROCEPHIN)  IV Stopped (07/21/20 1850)  . metronidazole Stopped  (07/22/20 1443)     LOS: 2 days    Time spent: 30 minutes    Barb Merino, MD [facility] Pager [phone]

## 2020-07-22 NOTE — Progress Notes (Addendum)
[facility] SURGICAL PROGRESS NOTE (cpt [phone])  Hospital Day(s): 2.   Interval History: Patient seen and examined, no acute events or new complaints overnight. Patient very anxious regarding percutaneous cholecystostomy tube, denies fever, chills, nausea, emesis. No abdominal pain at rest but is still tender with palpation. Leukocytosis continues to gradually improve this morning now 13.3K. Renal function normalized with sCr - 0.92. He does continue to have hyperbilirubinemia for which MRCP was obtained yesterday and showed no choledocholithiasis however this was without contrast secondary to patient intolerance. IR also evaluated patient yesterday with plans for percutaneous cholecystostomy tube.   Review of Systems:  Constitutional: denies fever, chills  HEENT: denies cough or congestion  Respiratory: denies any shortness of breath  Cardiovascular: denies chest pain or palpitations  Gastrointestinal: + abdominal pain, denied N/V, or diarrhea/and bowel function as per interval history Genitourinary: denies burning with urination or urinary frequency Musculoskeletal: denies pain, decreased motor or sensation  Vital signs in last 24 hours: [min-max] current  Temp:  [97.3 F (36.3 C)-99 F (37.2 C)] 97.3 F (36.3 C) (08/13 0430) Pulse Rate:  [88-108] 91 (08/13 0430) Resp:  [20] 20 (08/13 0430) BP: (109-117)/(63-75) 117/71 (08/13 0430) SpO2:  [90 %-94 %] 91 % (08/13 0430)     Height: 6\' 1"  (185.4 cm) Weight: (!) 140.7 kg BMI (Calculated): 40.93   Intake/Output last 2 shifts:  08/12 0701 - 08/13 0700 In: 740 [P.O.:240; I.V.:300; IV Piggyback:200] Out: 1500 [Urine:1500]   Physical Exam:  Constitutional: alert, cooperative and no distress  HENT: normocephalic without obvious abnormality  Eyes: PERRL, EOM's grossly intact and symmetric  Respiratory: breathing non-labored at rest  Cardiovascular: regular rate and sinus rhythm  Gastrointestinal: Obese, soft, RUQ  tenderness with palpation, and non-distended, no rebound/guarding Musculoskeletal: no edema or wounds, motor and sensation grossly intact, NT    Labs:  CBC Latest Ref Rng & Units 07/22/2020 07/21/2020 07/20/2020  WBC 4.0 - 10.5 K/uL 13.3(H) 15.0(H) 20.7(H)  Hemoglobin 13.0 - 17.0 g/dL 13.1 13.4 14.0  Hematocrit 39 - 52 % 41.2 41.2 43.9  Platelets 150 - 400 K/uL 221 190 202   CMP Latest Ref Rng & Units 07/22/2020 07/21/2020 07/20/2020  Glucose 70 - 99 mg/dL 113(H) 86 114(H)  BUN 8 - 23 mg/dL 18 19 17   Creatinine 0.61 - 1.24 mg/dL 0.92 1.27(H) 1.31(H)  Sodium 135 - 145 mmol/L 139 137 135  Potassium 3.5 - 5.1 mmol/L 4.1 4.4 4.6  Chloride 98 - 111 mmol/L 105 104 101  CO2 22 - 32 mmol/L 24 25 25   Calcium 8.9 - 10.3 mg/dL 8.6(L) 8.7(L) 9.2  Total Protein 6.5 - 8.1 g/dL 6.3(L) 6.1(L) -  Total Bilirubin 0.3 - 1.2 mg/dL 2.8(H) 2.9(H) -  Alkaline Phos 38 - 126 U/L 119 77 -  AST 15 - 41 U/L 28 23 -  ALT 0 - 44 U/L 36 34 -     Imaging studies: No new pertinent imaging studies   Assessment/Plan: (ICD-10's: K81.0) 79 y.o. male with improving leukocytosis but persistent hyperbilirubinemia admitted with sepsis presumably from cholecystitis with positive Murphy's sign on my examination, complicated by pertinent comorbidities including debilitation and Afib with RVR requiring anticoagulation.   - Appreciate IR assistance; Plan for percutaneous cholecystostomy today. OK to resume anticoagulation after procedure.  - NPO for procedure; we can initiate CLD after this  - Continue IVF resuscitation              - Continue IV ABx (Rocephin + Flagyl)             -  Monitor abdominal examination             - Monitor leukocytosis; improved   - Monitor bilirubin level; suspect compression from gallbladder; MRCP negative for choledocholithiasis   - No emergent surgery; he will benefit from interval cholecystectomy in at least 8 weeks time             - Further management per primary service; we will follow    All of the above findings and recommendations were discussed with the patient, and the medical team, and all of patient's questions were answered to his expressed satisfaction.  -- Edison Simon, PA-C [facility] 07/22/2020, 7:30 AM [phone] M-F: 7am - 4pm   I saw and evaluated the patient.  I agree with the above documentation, exam, and plan, which I have edited where appropriate. Fredirick Maudlin  12:43 PM

## 2020-07-23 DIAGNOSIS — I482 Chronic atrial fibrillation, unspecified: Secondary | ICD-10-CM

## 2020-07-23 DIAGNOSIS — I5022 Chronic systolic (congestive) heart failure: Secondary | ICD-10-CM | POA: Diagnosis not present

## 2020-07-23 DIAGNOSIS — J449 Chronic obstructive pulmonary disease, unspecified: Secondary | ICD-10-CM | POA: Diagnosis not present

## 2020-07-23 DIAGNOSIS — K8000 Calculus of gallbladder with acute cholecystitis without obstruction: Secondary | ICD-10-CM | POA: Diagnosis not present

## 2020-07-23 LAB — CBC WITH DIFFERENTIAL/PLATELET
Abs Immature Granulocytes: 0.07 10*3/uL (ref 0.00–0.07)
Basophils Absolute: 0 10*3/uL (ref 0.0–0.1)
Basophils Relative: 0 %
Eosinophils Absolute: 0.1 10*3/uL (ref 0.0–0.5)
Eosinophils Relative: 1 %
HCT: 43.9 % (ref 39.0–52.0)
Hemoglobin: 14.1 g/dL (ref 13.0–17.0)
Immature Granulocytes: 1 %
Lymphocytes Relative: 8 %
Lymphs Abs: 0.8 10*3/uL (ref 0.7–4.0)
MCH: 30 pg (ref 26.0–34.0)
MCHC: 32.1 g/dL (ref 30.0–36.0)
MCV: 93.4 fL (ref 80.0–100.0)
Monocytes Absolute: 0.7 10*3/uL (ref 0.1–1.0)
Monocytes Relative: 7 %
Neutro Abs: 8.6 10*3/uL — ABNORMAL HIGH (ref 1.7–7.7)
Neutrophils Relative %: 83 %
Platelets: 232 10*3/uL (ref 150–400)
RBC: 4.7 MIL/uL (ref 4.22–5.81)
RDW: 17.6 % — ABNORMAL HIGH (ref 11.5–15.5)
WBC: 10.3 10*3/uL (ref 4.0–10.5)
nRBC: 0 % (ref 0.0–0.2)

## 2020-07-23 LAB — COMPREHENSIVE METABOLIC PANEL
ALT: 27 U/L (ref 0–44)
AST: 17 U/L (ref 15–41)
Albumin: 2.3 g/dL — ABNORMAL LOW (ref 3.5–5.0)
Alkaline Phosphatase: 120 U/L (ref 38–126)
Anion gap: 7 (ref 5–15)
BUN: 17 mg/dL (ref 8–23)
CO2: 28 mmol/L (ref 22–32)
Calcium: 8.7 mg/dL — ABNORMAL LOW (ref 8.9–10.3)
Chloride: 106 mmol/L (ref 98–111)
Creatinine, Ser: 0.96 mg/dL (ref 0.61–1.24)
GFR calc Af Amer: >60 mL/min (ref >60)
GFR calc non Af Amer: >60 mL/min (ref >60)
Glucose, Bld: 116 mg/dL — ABNORMAL HIGH (ref 70–99)
Potassium: 4.3 mmol/L (ref 3.5–5.1)
Sodium: 141 mmol/L (ref 135–145)
Total Bilirubin: 2 mg/dL — ABNORMAL HIGH (ref 0.3–1.2)
Total Protein: 6.2 g/dL — ABNORMAL LOW (ref 6.5–8.1)

## 2020-07-23 LAB — PHOSPHORUS: Phosphorus: 3.3 mg/dL (ref 2.5–4.6)

## 2020-07-23 LAB — MAGNESIUM: Magnesium: 2.5 mg/dL — ABNORMAL HIGH (ref 1.7–2.4)

## 2020-07-23 MED ORDER — RIVAROXABAN 20 MG PO TABS
20.0000 mg | ORAL_TABLET | Freq: Every day | ORAL | Status: DC
  Filled 2020-07-23 (×2): qty 1

## 2020-07-23 MED ORDER — SENNOSIDES-DOCUSATE SODIUM 8.6-50 MG PO TABS
2.0000 | ORAL_TABLET | Freq: Two times a day (BID) | ORAL | Status: DC
  Administered 2020-07-23 – 2020-07-24 (×2): 2 via ORAL
  Filled 2020-07-23 (×2): qty 2

## 2020-07-23 NOTE — Progress Notes (Signed)
Daughter called for update. Update given. Daughter requested to speak with MD. Message sent to MD.   Fuller Mandril, RN

## 2020-07-23 NOTE — Progress Notes (Signed)
PT Cancellation Note  Patient Details Name: Stanley Marks MRN: [id number] DOB: [birth year]   Cancelled Treatment:    Reason Eval/Treat Not Completed: Patient declined, no reason specified,Pt. Says " why does everyone keep asking me to do therapy- I don't want it"  Patient declined PT intervention today reporting that he does not receive therapy at all because he refuses it. Pt. education was provided about PT services, however pt. reports that he does not want it. Pt.'s right to decline intervention were reserved this date.    Alanson Puls, [place] DPT 07/23/2020, 9:55 AM

## 2020-07-23 NOTE — Progress Notes (Signed)
OT Cancellation Note  Patient Details Name: Stanley Marks MRN: [id number] DOB: [birth year]   Cancelled Treatment:    Reason Eval/Treat Not Completed: Pt. reports being depressed that he can't go home today. Patient declined OT intervention today reporting that he does not receive therapy at all because he refuses it. Pt. education was provided about OT services, however pt. reports that he does not want it. Pt.'s right to decline intervention were reserved this date.   Harrel Carina, MS, OTR/L 07/23/2020, 9:46 AM

## 2020-07-23 NOTE — Progress Notes (Signed)
[facility] Day(s): 3.   S/p perc cholecystostomy day 1  Interval History: Patient seen and examined, no acute events or new complaints overnight. Patient reports persistence of right upper quadrant discomfort, likely more drain related than persistent inflammation.  He stated "I am out of here tomorrow or I am walking."  Appears to be tolerating liquid diet so far, ready for advancement.  Review of Systems:  Constitutional: denies fever, chills  Respiratory: denies any shortness of breath  Cardiovascular: denies chest pain or palpitations  Gastrointestinal: denies  N/V, or diarrhea/and bowel function  Musculoskeletal: denies pain,  Integumentary: denies any other rashes or skin discolorations  Vital signs in last 24 hours: [min-max] current  Temp:  [97.6 F (36.4 C)-98.8 F (37.1 C)] 97.6 F (36.4 C) (08/14 0408) Pulse Rate:  [91-112] 91 (08/14 0408) Resp:  [20-112] 20 (08/14 0408) BP: (114-162)/(81-97) 122/83 (08/14 0408) SpO2:  [91 %-98 %] 95 % (08/14 0408)     Height: 6\' 1"  (185.4 cm) Weight: (!) 140.7 kg BMI (Calculated): 40.93   Intake/Output last 2 shifts:  08/13 0701 - 08/14 0700 In: 743.1 [P.O.:340; I.V.:3.1; IV Piggyback:400] Out: 525 [Urine:400; Drains:125]   Physical Exam:  Constitutional: alert, cooperative and no distress  Respiratory: breathing non-labored at rest  Cardiovascular: regular rate and sinus rhythm  Gastrointestinal: Well rounded, right upper quadrant drain present with brown/bilious drainage, otherwise soft, non-tender, and non-distended Integumentary: No remarkable gross changes or bruising.  Labs:  CBC Latest Ref Rng & Units 07/23/2020 07/22/2020 07/21/2020  WBC 4.0 - 10.5 K/uL 10.3 13.3(H) 15.0(H)  Hemoglobin 13.0 - 17.0 g/dL 14.1 13.1 13.4  Hematocrit 39 - 52 % 43.9 41.2 41.2  Platelets 150 - 400 K/uL 232 221 190   CMP Latest Ref Rng & Units 07/23/2020 07/22/2020 07/21/2020  Glucose 70 - 99  mg/dL 116(H) 113(H) 86  BUN 8 - 23 mg/dL 17 18 19   Creatinine 0.61 - 1.24 mg/dL 0.96 0.92 1.27(H)  Sodium 135 - 145 mmol/L 141 139 137  Potassium 3.5 - 5.1 mmol/L 4.3 4.1 4.4  Chloride 98 - 111 mmol/L 106 105 104  CO2 22 - 32 mmol/L 28 24 25   Calcium 8.9 - 10.3 mg/dL 8.7(L) 8.6(L) 8.7(L)  Total Protein 6.5 - 8.1 g/dL 6.2(L) 6.3(L) 6.1(L)  Total Bilirubin 0.3 - 1.2 mg/dL 2.0(H) 2.8(H) 2.9(H)  Alkaline Phos 38 - 126 U/L 120 119 77  AST 15 - 41 U/L 17 28 23   ALT 0 - 44 U/L 27 36 34     Imaging studies: No new pertinent imaging studies   Assessment/Plan:  79 y.o. male 1 day,   s/p percutaneous drainage via cholecystostomy tube, complicated by pertinent comorbidities including.  Patient Active Problem List   Diagnosis Date Noted  . Acute calculous cholecystitis 07/20/2020  . Obesity (BMI 35.0-39.9 without comorbidity)   . PVD (peripheral vascular disease) ([place])   . Wound of left leg   . Acute respiratory failure due to COVID-19 ([place]) 12/22/2019  . COPD with chronic bronchitis ([place]) 12/22/2019  . Atrial fibrillation, chronic ([place]) 12/22/2019  . Acute respiratory failure with hypoxia ([place]) 12/22/2019  . Pneumonia due to COVID-19 virus 12/22/2019  . Atrial fibrillation with RVR ([place])   . Essential hypertension   . Influenza A 01/28/2017  . Pneumonia 01/28/2017  . COPD exacerbation ([place]) 01/28/2017  . Tobacco abuse counseling 01/28/2017  . Patient's noncompliance with other medical treatment and regimen 01/28/2017  . Emphysema of lung ([place]) 01/28/2017  .  Leukocytosis 01/28/2017  . Chronic systolic CHF (congestive heart failure) ([place]) 01/28/2017  . Bradycardia 01/28/2017  . Acute and chronic respiratory failure with hypoxia ([place]) 01/26/2017  . Obstructive sleep apnea syndrome 03/01/2016  . Personal history of other malignant neoplasm of skin 12/08/2014    -Advance diet as tolerated  -Continue antibiotics, consider transition to p.o.  -Monitor abdominal  examination  -Leukocytosis trending down, further management per primary service,   -Resume anticoagulation  -- Ronny Bacon, M.D., [facility] 07/23/2020

## 2020-07-23 NOTE — Progress Notes (Signed)
PROGRESS NOTE    Stanley Marks  MGN:[id number] DOB: 1941/09/01 DOA: 07/20/2020 PCP: Perrin Maltese, MD   Chief complaint.  Generalized weakness. Brief Narrative:  79 year old gentleman with extensive medical issues including chronic systolic congestive heart failure, A. fib on Xarelto, hypertension, peripheral vascular disease, COPD, obstructive sleep apnea presented to the emergency room with weakness and fall.  Recently seen at the emergency room with cholelithiasis concerning for cholecystitis.  He has been feeling weak and not able to eat much so came back to the emergency room.  In the emergency room he is found to have leukocytosis.  Abdominal ultrasound again showed equivocal for acute cholecystitis.  Admitted with surgical consultation. MRCP with no bile duct abnormalities. 8/13, IR guided percutaneous cholecystostomy.  8/14.  Gram stain from cholecystostomy drain showed abundant gram-negative rods, moderate amount of gram-positive cocci and a rare gram positive rods.  Culture pending.  Patient feels better today.  Continue antibiotics with Rocephin and Flagyl.   Assessment & Plan:   Principal Problem:   Acute calculous cholecystitis Active Problems:   Chronic systolic CHF (congestive heart failure) (HCC)   COPD with chronic bronchitis (H[facility])   Atrial fibrillation, chronic ([place])  #1.  Sepsis secondary to acute cholecystitis. Status post cholecystectomy drain.  Blood culture negative, culture from drain fluid still pending.  Continue current antibiotics with Flagyl and Rocephin. Due to severity of patient condition, patient unable to be discharged util culture from the drain is available.  #2.  Acute kidney injury. Renal function improved.  3.  Chronic atrial fibrillation. Rate controlled.  Restarted Xarelto.  4.  COPD with chronic hypoxemic respiratory failure. Patient was chronically on 2 to 3 L oxygen.  Continue oxygen treatment.  5.  Chronic systolic congestive heart  failure. Condition stable, restart Entresto at discharge: Potentially tomorrow.   DVT prophylaxis: Xarelto. Code Status: Full Family Communication: none Disposition Plan:  . Patient came from: home            . Anticipated d/c place: home . Barriers to d/c OR conditions which need to be met to effect a safe d/c:   Consultants:   GS  Interventional radiology  Procedures:  Cholecystostomy Antimicrobials:  Rocephin and Flagyl.  Subjective: Patient feels better today, no significant abdominal pain or nausea vomiting.  No short of breath or cough.  He is on 3-1/2 L oxygen. No fever or chills.  Objective: Vitals:   07/22/20 1353 07/22/20 1456 07/22/20 1959 07/23/20 0408  BP: (!) 143/96 (!) 150/87 114/87 122/83  Pulse: (!) 112 (!) 104 99 91  Resp:  (!) _0 Temp: 98.2 F (36.8 C) 98.2 F (36.8 C) 98.3 F (36.8 C) 97.6 F (36.4 C)  TempSrc: Oral Oral    SpO2: 91% 93% 94% 95%  Weight:      Height:        Intake/Output Summary (Last 24 hours) at 07/23/2020 1026 Last data filed at 07/23/2020 0914 Gross per 24 hour  Intake 1431.63 ml  Output 850 ml  Net 581.63 ml   Filed Weights   07/20/20 1509 07/20/20 2202  Weight: 131 kg (!) 140.7 kg    Examination:  General exam: Appears calm and comfortable  Respiratory system: Decreased breathing sounds.Marland Kitchen Respiratory effort normal. Cardiovascular system: Irregular, rate uncontrolled. No JVD, murmurs, rubs, gallops or clicks.  Gastrointestinal system: Abdomen is nondistended, soft and nontender. No organomegaly or masses felt. Normal bowel sounds heard. Central nervous system: Alert and oriented. No focal neurological deficits. Extremities:  Trace edema with chronic skin changes. Skin: No rashes, lesions or ulcers Psychiatry: Judgement and insight appear normal. Mood & affect appropriate.     Data Reviewed: I have personally reviewed following labs and imaging studies  CBC: Recent Labs  Lab 07/16/20 1820  07/20/20 1515 07/21/20 0427 07/22/20 0548 07/23/20 0718  WBC 14.2* 20.7* 15.0* 13.3* 10.3  NEUTROABS  --   --   --   --  8.6*  HGB 17.1* 14.0 13.4 13.1 14.1  HCT 51.8 43.9 41.2 41.2 43.9  MCV 91.4 94.6 93.0 95.6 93.4  PLT 228 202 190 221 383   Basic Metabolic Panel: Recent Labs  Lab 07/16/20 1820 07/20/20 1515 07/21/20 0427 07/22/20 0548 07/23/20 0718  NA 142 135 137 139 141  K 4.5 4.6 4.4 4.1 4.3  CL 102 101 104 105 106  CO2 _0 GLUCOSE 147* 114* 86 113* 116*  BUN _1 CREATININE 1.42* 1.31* 1.27* 0.92 0.96  CALCIUM 10.2 9.2 8.7* 8.6* 8.7*  MG  --   --   --   --  2.5*  PHOS  --   --   --   --  3.3   GFR: Estimated Creatinine Clearance: 96.5 mL/min (by C-G formula based on SCr of 0.96 mg/dL). Liver Function Tests: Recent Labs  Lab 07/16/20 1820 07/21/20 0427 07/22/20 0548 07/23/20 0718  AST 12* _2 ALT 17 34 36 27  ALKPHOS 72 77 119 120  BILITOT 1.0 2.9* 2.8* 2.0*  PROT 7.8 6.1* 6.3* 6.2*  ALBUMIN 3.8 2.5* 2.5* 2.3*   Recent Labs  Lab 07/16/20 1820  LIPASE 34   No results for input(s): AMMONIA in the last 168 hours. Coagulation Profile: Recent Labs  Lab 07/22/20 0548  INR 1.2   Cardiac Enzymes: No results for input(s): CKTOTAL, CKMB, CKMBINDEX, TROPONINI in the last 168 hours. BNP (last 3 results) No results for input(s): PROBNP in the last 8760 hours. HbA1C: No results for input(s): HGBA1C in the last 72 hours. CBG: No results for input(s): GLUCAP in the last 168 hours. Lipid Profile: No results for input(s): CHOL, HDL, LDLCALC, TRIG, CHOLHDL, LDLDIRECT in the last 72 hours. Thyroid Function Tests: No results for input(s): TSH, T4TOTAL, FREET4, T3FREE, THYROIDAB in the last 72 hours. Anemia Panel: No results for input(s): VITAMINB12, FOLATE, FERRITIN, TIBC, IRON, RETICCTPCT in the last 72 hours. Sepsis Labs: No results for input(s): PROCALCITON, LATICACIDVEN in the last 168 hours.  Recent Results (from the past  240 hour(s))  Blood culture (routine x 2)     Status: None (Preliminary result)   Collection Time: 07/20/20  6:57 PM   Specimen: BLOOD  Result Value Ref Range Status   Specimen Description BLOOD LEFT ANTECUBITAL  Final   Special Requests   Final    BOTTLES DRAWN AEROBIC AND ANAEROBIC Blood Culture results may not be optimal due to an inadequate volume of blood received in culture bottles   Culture   Final    NO GROWTH 3 DAYS Performed at [facility], [address], [place], [place] [address]    Report Status PENDING  Incomplete  Blood culture (routine x 2)     Status: None (Preliminary result)   Collection Time: 07/20/20  6:57 PM   Specimen: BLOOD  Result Value Ref Range Status   Specimen Description BLOOD RIGHT ANTECUBITAL  Final   Special Requests   Final    BOTTLES DRAWN AEROBIC AND ANAEROBIC  Blood Culture adequate volume   Culture   Final    NO GROWTH 3 DAYS Performed at [facility], [address] [place], [place] [address]    Report Status PENDING  Incomplete  SARS Coronavirus 2 by RT PCR (hospital order, performed in [facility] hospital lab) Nasopharyngeal Nasopharyngeal Swab     Status: None   Collection Time: 07/20/20  7:30 PM   Specimen: Nasopharyngeal Swab  Result Value Ref Range Status   SARS Coronavirus 2 NEGATIVE NEGATIVE Final    Comment: (NOTE) SARS-CoV-2 target nucleic acids are NOT DETECTED.  The SARS-CoV-2 RNA is generally detectable in upper and lower respiratory specimens during the acute phase of infection. The lowest concentration of SARS-CoV-2 viral copies this assay can detect is 250 copies / mL. A negative result does not preclude SARS-CoV-2 infection and should not be used as the sole basis for treatment or other patient management decisions.  A negative result may occur with improper specimen collection / handling, submission of specimen other than nasopharyngeal swab, presence of viral mutation(s) within the areas targeted  by this assay, and inadequate number of viral copies (<250 copies / mL). A negative result must be combined with clinical observations, patient history, and epidemiological information.  Fact Sheet for Patients:   StrictlyIdeas.no  Fact Sheet for Healthcare Providers: BankingDealers.co.za  This test is not yet approved or  cleared by the [place] FDA and has been authorized for detection and/or diagnosis of SARS-CoV-2 by FDA under an Emergency Use Authorization (EUA).  This EUA will remain in effect (meaning this test can be used) for the duration of the COVID-19 declaration under Section 564(b)(1) of the Act, 21 U.S.C. section 360bbb-3(b)(1), unless the authorization is terminated or revoked sooner.  Performed at [facility], [address] [place], [place] [address]   Aerobic/Anaerobic Culture (surgical/deep wound)     Status: None (Preliminary result)   Collection Time: 07/22/20 12:17 PM   Specimen: Gallbladder; Bile  Result Value Ref Range Status   Specimen Description   Final    GALL BLADDER Performed at [facility], [address], [place], [place] [address]    Special Requests   Final    NONE Performed at [facility], [place], [place] [address]    Gram Stain   Final    ABUNDANT WBC PRESENT, PREDOMINANTLY PMN ABUNDANT GRAM NEGATIVE RODS MODERATE GRAM POSITIVE COCCI RARE GRAM POSITIVE RODS Performed at [facility], [place] [address] [place], [place] [address]    Culture PENDING  Incomplete   Report Status PENDING  Incomplete         Radiology Studies: [place] Intraoperative  Result Date: 07/22/2020 CLINICAL DATA:  Ultrasound was provided for use by the ordering physician, and a technical charge was applied by the performing facility.  No radiologist interpretation/professional services rendered.   MR ABDOMEN MRCP WO CONTRAST  Result Date: 07/21/2020 CLINICAL  DATA:  Right upper quadrant pain. Nondiagnostic ultrasound. Elevated bilirubin. Rule out choledocholithiasis. EXAM: MRI ABDOMEN WITHOUT CONTRAST  (INCLUDING MRCP) TECHNIQUE: Multiplanar multisequence MR imaging of the abdomen was performed. Heavily T2-weighted images of the biliary and pancreatic ducts were obtained, and three-dimensional MRCP images were rendered by post processing. COMPARISON:  Abdominal ultrasound 1121. FINDINGS: Exam is degraded by mild to moderate motion and lack of IV contrast. Lower chest: Moderate cardiomegaly. Trace pleural fluid is likely physiologic. Hepatobiliary: Mild hepatic steatosis. No suspicious liver lesion. No intrahepatic biliary duct dilatation. The gallbladder  is mildly distended. Small stones within. Gallbladder wall thickening and irregularity, including on 26/7. Mild pericholecystic edema. The common duct measures on the order of 4 mm on 41/13, normal. No choledocholithiasis given motion degradation. Pancreas:  Normal, without mass or ductal dilatation. Spleen:  Normal in size, without focal abnormality. Adrenals/Urinary Tract: Normal right adrenal gland. Mild left adrenal thickening and nodularity, likely due to an adenoma 2.1 cm on 21/4. T2 hyperintense bilateral renal lesions are likely cysts but incompletely characterized. A posterior interpolar right renal T2 hypointense 1.3 cm lesion on 13/2 is mildly heterogeneous T1 signal on 68/16. Similar in size on the CT of 03/05/2019. No hydronephrosis. Stomach/Bowel: Normal stomach and abdominal bowel loops. Vascular/Lymphatic: The infrarenal aorta is tortuous, dilated at up to 3.2 cm on 21/2. Prominent nodes including a porta hepatis node of 1.2 cm on 19/7. Similar to 03/05/2019. 7 mm right retrocrural node on this image. Aortocaval index node of 1.1 cm on 31/7. Other: Nonspecific mesenteric edema with anasarca, possibly representing fluid overload. Musculoskeletal: Convex left lumbar spine curvature. IMPRESSION: 1. Degraded  exam secondary to motion and lack of IV contrast. 2. Findings suspicious for acute cholecystitis. No choledocholithiasis or biliary duct dilatation. 3. 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 Recommend follow-up every 3 years. This recommendation follows ACR consensus guidelines: White Paper of the ACR Incidental Findings Committee II on Vascular Findings. J Am Coll Radiol 2013; 10:789-794. 4. Prominent but not pathologically enlarged upper abdominal nodes are most likely reactive in the setting of steatosis. 5. Right renal lesion is similar in size to 03/05/2019, incompletely characterized. Most likely a hemorrhagic cyst. Solid neoplasm such as papillary type renal cell carcinoma cannot be excluded. Consider renal ultrasound to allow surveillance. Alternatively, if the patient can undergo pre and postcontrast CT or MRI, this should be considered (when patient is able to follow directions and breath hold). Electronically Signed   By: Abigail Miyamoto M.D.   On: 07/21/2020 14:32   MR 3D Recon At Scanner  Result Date: 07/21/2020 CLINICAL DATA:  Right upper quadrant pain. Nondiagnostic ultrasound. Elevated bilirubin. Rule out choledocholithiasis. EXAM: MRI ABDOMEN WITHOUT CONTRAST  (INCLUDING MRCP) TECHNIQUE: Multiplanar multisequence MR imaging of the abdomen was performed. Heavily T2-weighted images of the biliary and pancreatic ducts were obtained, and three-dimensional MRCP images were rendered by post processing. COMPARISON:  Abdominal ultrasound 1121. FINDINGS: Exam is degraded by mild to moderate motion and lack of IV contrast. Lower chest: Moderate cardiomegaly. Trace pleural fluid is likely physiologic. Hepatobiliary: Mild hepatic steatosis. No suspicious liver lesion. No intrahepatic biliary duct dilatation. The gallbladder is mildly distended. Small stones within. Gallbladder wall thickening and irregularity, including on 26/7. Mild pericholecystic edema. The common duct measures on the order of 4 mm  on 41/13, normal. No choledocholithiasis given motion degradation. Pancreas:  Normal, without mass or ductal dilatation. Spleen:  Normal in size, without focal abnormality. Adrenals/Urinary Tract: Normal right adrenal gland. Mild left adrenal thickening and nodularity, likely due to an adenoma 2.1 cm on 21/4. T2 hyperintense bilateral renal lesions are likely cysts but incompletely characterized. A posterior interpolar right renal T2 hypointense 1.3 cm lesion on 13/2 is mildly heterogeneous T1 signal on 68/16. Similar in size on the CT of 03/05/2019. No hydronephrosis. Stomach/Bowel: Normal stomach and abdominal bowel loops. Vascular/Lymphatic: The infrarenal aorta is tortuous, dilated at up to 3.2 cm on 21/2. Prominent nodes including a porta hepatis node of 1.2 cm on 19/7. Similar to 03/05/2019. 7 mm right retrocrural node on this image.  Aortocaval index node of 1.1 cm on 31/7. Other: Nonspecific mesenteric edema with anasarca, possibly representing fluid overload. Musculoskeletal: Convex left lumbar spine curvature. IMPRESSION: 1. Degraded exam secondary to motion and lack of IV contrast. 2. Findings suspicious for acute cholecystitis. No choledocholithiasis or biliary duct dilatation. 3. 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 Recommend follow-up every 3 years. This recommendation follows ACR consensus guidelines: White Paper of the ACR Incidental Findings Committee II on Vascular Findings. J Am Coll Radiol 2013; 10:789-794. 4. Prominent but not pathologically enlarged upper abdominal nodes are most likely reactive in the setting of steatosis. 5. Right renal lesion is similar in size to 03/05/2019, incompletely characterized. Most likely a hemorrhagic cyst. Solid neoplasm such as papillary type renal cell carcinoma cannot be excluded. Consider renal ultrasound to allow surveillance. Alternatively, if the patient can undergo pre and postcontrast CT or MRI, this should be considered (when patient is able to  follow directions and breath hold). Electronically Signed   By: Abigail Miyamoto M.D.   On: 07/21/2020 14:32   IR Perc Cholecystostomy  Result Date: 07/22/2020 CLINICAL DATA:  Acute calculus cholecystitis EXAM: PERCUTANEOUS CHOLECYSTOSTOMY TUBE PLACEMENT WITH ULTRASOUND AND FLUOROSCOPIC GUIDANCE FLUOROSCOPY TIME:  0.5 minute; 7.89 mGy TECHNIQUE: The procedure, risks (including but not limited to bleeding, infection, organ damage ), benefits, and alternatives were explained to the patient. Questions regarding the procedure were encouraged and answered. The patient understands and consents to the procedure. Survey ultrasound of the abdomen was performed and an appropriate skin entry site was identified. Skin site was marked, prepped with Betadine, and draped in usual sterile fashion, and infiltrated locally with 1% lidocaine. Intravenous Fentanyl 51mg and Versed 173mwere administered as conscious sedation during continuous monitoring of the patient's level of consciousness and physiological / cardiorespiratory status by the radiology RN, with a total moderate sedation time of 29 minutes. Under real-time ultrasound guidance, gallbladder was accessed using a transhepatic approach with a 21-gauge needle. Ultrasound image documentation was saved. Bile returned through the hub. Needle was exchanged over a 018 guidewire for transitional dilator which allowed placement of 035 J wire. Over this, a 10.2 French pigtail catheter was advanced and formed centrally in the gallbladder lumen. 20 mL purulent bile removed. Small contrast injection confirmed appropriate position. Catheter secured externally with 0 Prolene suture and placed external drain bag. A sample of the aspirate sent for Gram stain and culture. Patient tolerated the procedure well. COMPLICATIONS: COMPLICATIONS none IMPRESSION: 1. Technically successful percutaneous cholecystostomy tube placement with ultrasound and fluoroscopic guidance. Electronically Signed    By: D Lucrezia Europe.D.   On: 07/22/2020 13:40        Scheduled Meds: . amiodarone  400 mg Oral BID  . digoxin  0.125 mg Oral QODAY  . metoprolol succinate  50 mg Oral Daily  . sodium chloride flush  5 mL Intracatheter Q8H  . torsemide  20 mg Oral Daily   Continuous Infusions: . sodium chloride Stopped (07/23/20 0830)  . cefTRIAXone (ROCEPHIN)  IV Stopped (07/22/20 1936)  . metronidazole Stopped (07/23/20 0640)     LOS: 3 days    Time spent: 28 minutes    DeSharen HonesMD [facility]   To contact the attending provider between 7A-7P or the covering provider during after hours 7P-7A, please log into the web site [email or web address] and access using universal [place] password for that web site. If you do not have the password, please call the hospital operator.  07/23/2020, 10:26 AM

## 2020-07-24 DIAGNOSIS — I482 Chronic atrial fibrillation, unspecified: Secondary | ICD-10-CM | POA: Diagnosis not present

## 2020-07-24 DIAGNOSIS — J449 Chronic obstructive pulmonary disease, unspecified: Secondary | ICD-10-CM | POA: Diagnosis not present

## 2020-07-24 DIAGNOSIS — I5022 Chronic systolic (congestive) heart failure: Secondary | ICD-10-CM | POA: Diagnosis not present

## 2020-07-24 DIAGNOSIS — K8000 Calculus of gallbladder with acute cholecystitis without obstruction: Secondary | ICD-10-CM | POA: Diagnosis not present

## 2020-07-24 LAB — COMPREHENSIVE METABOLIC PANEL
ALT: 22 U/L (ref 0–44)
AST: 12 U/L — ABNORMAL LOW (ref 15–41)
Albumin: 2.4 g/dL — ABNORMAL LOW (ref 3.5–5.0)
Alkaline Phosphatase: 106 U/L (ref 38–126)
Anion gap: 11 (ref 5–15)
BUN: 17 mg/dL (ref 8–23)
CO2: 26 mmol/L (ref 22–32)
Calcium: 8.8 mg/dL — ABNORMAL LOW (ref 8.9–10.3)
Chloride: 106 mmol/L (ref 98–111)
Creatinine, Ser: 0.92 mg/dL (ref 0.61–1.24)
GFR calc Af Amer: >60 mL/min (ref >60)
GFR calc non Af Amer: >60 mL/min (ref >60)
Glucose, Bld: 108 mg/dL — ABNORMAL HIGH (ref 70–99)
Potassium: 4 mmol/L (ref 3.5–5.1)
Sodium: 143 mmol/L (ref 135–145)
Total Bilirubin: 1.2 mg/dL (ref 0.3–1.2)
Total Protein: 6.4 g/dL — ABNORMAL LOW (ref 6.5–8.1)

## 2020-07-24 MED ORDER — AMOXICILLIN-POT CLAVULANATE 875-125 MG PO TABS
1.0000 | ORAL_TABLET | Freq: Two times a day (BID) | ORAL | 0 refills | Status: DC
Start: 2020-07-24 — End: 2020-07-24

## 2020-07-24 MED ORDER — AMOXICILLIN-POT CLAVULANATE 875-125 MG PO TABS
1.0000 | ORAL_TABLET | Freq: Two times a day (BID) | ORAL | 0 refills | Status: AC
Start: 2020-07-24 — End: 2020-07-31

## 2020-07-24 NOTE — TOC Transition Note (Signed)
Transition of Care [facility]) - CM/SW Discharge Note   Patient Details  Name: Stanley Marks MRN: [id number] Date of Birth: 09/04/1941  Transition of Care [facility]) CM/SW Contact:  Ova Freshwater Phone Number: [phone] 07/24/2020, 4:01 PM   Clinical Narrative:     Pt will d/c home via EMS.  Patient [place] current with [employer] for oxygen 2-3L, and will receive DME from [facility], walker 5", and 3in1, and home health with [facility] for PT, OT, RN, and wound care. Attending and RN updated.  TOC consult complete.  Final next level of care: [place]     Patient Goals and CMS Choice Patient states their goals for this hospitalization and ongoing recovery are:: Go home with home health. CMS Medicare.gov Compare Post Acute Care list provided to:: Patient Represenative (must comment) Gillermo Murdoch (Granddaughter) [phone]) Choice offered to / list presented to : Adult Children (Rozanna Boer daughter)  Discharge Placement                       Discharge Plan and Services In-house Referral: Clinical Social Work   Post Acute Care Choice: Home Health                    HH Arranged: PT, RN          Social Determinants of Health (SDOH) Interventions     Readmission Risk Interventions Readmission Risk Prevention Plan 01/26/2020  Transportation Screening Complete  PCP or Specialist Appt within 3-5 Days Complete  HRI or Home Care Consult Complete  Palliative Care Screening Not Applicable  Medication Review (RN Care Manager) Complete  Some recent data might be hidden

## 2020-07-24 NOTE — Progress Notes (Signed)
Stanley Marks and O x4. VSS. Pt tolerating diet well. No complaints of nausea or vomiting. IV removed intact, prescriptions given. Pt voices understanding of discharge instructions with no further questions. Patient discharged via EMS  Allergies as of 07/24/2020      Reactions   Lipitor [atorvastatin Calcium] Other (See Comments)   arthralgia      Medication List    TAKE these medications   Advair HFA 230-21 MCG/ACT inhaler Generic drug: fluticasone-salmeterol Inhale 1 puff into the lungs 2 (two) times daily as needed.   albuterol 108 (90 Base) MCG/ACT inhaler Commonly known as: VENTOLIN HFA Inhale 2 puffs into the lungs every 6 (six) hours as needed for wheezing or shortness of breath.   amiodarone 200 MG tablet Commonly known as: PACERONE Take 2 tablets (400 mg total) by mouth 2 (two) times daily.   amoxicillin-clavulanate 875-125 MG tablet Commonly known as: Augmentin Take 1 tablet by mouth 2 (two) times daily for 7 days.   digoxin 0.125 MG tablet Commonly known as: LANOXIN Take 1 tablet (0.125 mg total) by mouth every other day.   Entresto 49-51 MG Generic drug: sacubitril-valsartan Take 1 tablet by mouth 2 (two) times daily.   metoprolol succinate 50 MG 24 hr tablet Commonly known as: TOPROL-XL Take 1 tablet (50 mg total) by mouth in the morning and at bedtime. Take with or immediately following Marks meal.   ondansetron 4 MG disintegrating tablet Commonly known as: Zofran ODT Take 1 tablet (4 mg total) by mouth every 8 (eight) hours as needed for nausea or vomiting.   Rivaroxaban 15 MG Tabs tablet Commonly known as: XARELTO Take 15 mg by mouth daily with supper.   torsemide 20 MG tablet Commonly known as: DEMADEX Take 20 mg by mouth daily.   traMADol 50 MG tablet Commonly known as: Ultram Take 1 tablet (50 mg total) by mouth every 6 (six) hours as needed.            Durable Medical Equipment  (From admission, onward)         Start     Ordered    07/24/20 1618  For home use only DME oxygen  Once       Question Answer Comment  Length of Need Lifetime   Liters per Minute 2.5   Frequency Continuous (stationary and portable oxygen unit needed)   Oxygen conserving device Yes   Oxygen delivery system Gas      07/24/20 1617   07/24/20 1213  For home use only DME Bedside commode  Once       Question:  Patient needs Marks bedside commode to treat with the following condition  Answer:  Cholecystitis   07/24/20 1212           Discharge Care Instructions  (From admission, onward)         Start     Ordered   07/24/20 0000  Discharge wound care:       Comments: Follow by home care.   07/24/20 1221          Vitals:   07/24/20 0834 07/24/20 1539  BP: 120/76 138/84  Pulse: 81 76  Resp:  18  Temp:  97.7 F (36.5 Stanley)  SpO2:  93%    Stanley Marks Stanley Marks

## 2020-07-24 NOTE — TOC Progression Note (Signed)
Transition of Care [facility]) - Progression Note    Patient Details  Name: Stanley Marks MRN: [id number] Date of Birth: 12/10/1941  Transition of Care [facility]) CM/SW [place], [place] Phone Number: [phone] 07/24/2020, 1:44 PM  Clinical Narrative:     Patient ready for d/c.  CSW attempting to contact patient's granddaughter to discuss disposition.  Cell phone number does not work. Left voicemail for return call.    CSW spoke with patient's daughter-in-law Edmonia Lynch and she stated she would be wiling to pick up the patient.  This CSW informed Edmonia Lynch, the patient would prefer to go home via EMS. This CSW will set EMS transport, once patient receives enema.   Expected Discharge Plan: [place]    Expected Discharge Plan and Services Expected Discharge Plan: [place] In-house Referral: Clinical Social Work   Post Acute Care Choice: [place] arrangements for the past 2 months: Single Family Home Expected Discharge Date: 07/24/20                         HH Arranged: PT, RN           Social Determinants of Health (SDOH) Interventions    Readmission Risk Interventions Readmission Risk Prevention Plan 01/26/2020  Transportation Screening Complete  PCP or Specialist Appt within 3-5 Days Complete  HRI or Home Care Consult Complete  Palliative Care Screening Not Applicable  Medication Review (RN Care Manager) Complete  Some recent data might be hidden

## 2020-07-24 NOTE — Evaluation (Signed)
Occupational Therapy Evaluation Patient Details Name: Stanley Marks MRN: [id number] DOB: [birth year] Today's Date: 07/24/2020    History of Present Illness Stanley Marks is a 79 year old gentleman with extensive medical issues including chronic systolic congestive heart failure, A. fib on Xarelto, hypertension, peripheral vascular disease, COPD, obstructive sleep apnea presented to the emergency room with weakness and fall.  Recently seen at the emergency room with cholelithiasis concerning for cholecystitis. S/p cholecystectomy drain 8/13.   Clinical Impression   Stanley Marks was seen for OT evaluation this date. Prior to hospital admission, pt was MOD I for mobility and ADLs using RW. Pt lives c son, DIL, and 3 teenage grand children who assist c IADLs and are home 24/7. Pt reports using his DIL's O2 several hours/day as needed. Pt presents to acute OT demonstrating impaired ADL performance and functional mobility 2/2 decreased activity tolerance, functional strength/ROM/balance deficits, and decreased LB access. Pt currently requires CGA + RW for toilet t/f +2 assist for lines/leads mgmt. MIN A x2 sit<>stand at lower height toilet. SpO2 90% on RA seated EOB, desat following toilet t/f to low 80s on RA - resolved to 90% on 4L [place]. MOD A for LB access at bed level - pt reports increased pain to abdomen c trunk flexion. MOD I self-drinking at bed level. SUPERVISION toileting at commode. Pt would benefit from skilled OT to address noted impairments and functional limitations (see below for any additional details) in order to maximize safety and independence while minimizing falls risk and caregiver burden. Upon hospital discharge, recommend HHOT to maximize pt safety and return to functional independence during meaningful occupations of daily life.     Follow Up Recommendations  Home health OT;Supervision/Assistance - 24 hour    Equipment Recommendations  3 in 1 bedside commode    Recommendations for  Other Services       Precautions / Restrictions Precautions Precautions: Fall;Other (comment) (drain ) Restrictions Weight Bearing Restrictions: No      Mobility Bed Mobility Overal bed mobility: Needs Assistance Bed Mobility: Supine to Sit;Sit to Supine     Supine to sit: Min assist Sit to supine: Min assist   General bed mobility comments: MOD I to scoot higher in bed. MIN A for LE mgmt and hand placement. Fatigued quickly, rest breaks for PLB  Transfers Overall transfer level: Needs assistance Equipment used: Rolling walker (2 wheeled) Transfers: Sit to/from Stand Sit to Stand: Min assist;+2 physical assistance         General transfer comment: CGA + BUE support sit<>stand at elevated bed. MIN A x2 sit<>stand at low toilet height     Balance Overall balance assessment: Needs assistance Sitting-balance support: Feet supported Sitting balance-Leahy Scale: Fair     Standing balance support: Bilateral upper extremity supported Standing balance-Leahy Scale: Fair Standing balance comment: heavy BUE support on RW         ADL either performed or assessed with clinical judgement   ADL Overall ADL's : Needs assistance/impaired      General ADL Comments: CGA + RW for ADL t/f +2 assist for lines/leads mgmt. MOD A for LB access at bed level - pt reports increased pain to abdomen c trunk flexion. MOD I self-drinking at bed level. SUPERVISION toileting at commode       Pertinent Vitals/Pain Pain Assessment: Faces Pain Score: 3  Faces Pain Scale: Hurts a little bit Pain Location: RLQ drain site Pain Descriptors / Indicators: Aching;Discomfort;Dull Pain Intervention(s): Limited activity within patient's tolerance;Monitored during session  Hand Dominance Right   Extremity/Trunk Assessment Upper Extremity Assessment Upper Extremity Assessment: Overall WFL for tasks assessed   Lower Extremity Assessment Lower Extremity Assessment: Generalized weakness        Communication Communication Communication: No difficulties   Cognition Arousal/Alertness: Awake/alert Behavior During Therapy: Flat affect Overall Cognitive Status: Within Functional Limits for tasks assessed        General Comments  90% on RA seated EOB - pt reports baseline no O2. Pt desat following toilet t/f to low 80s on RA, resolved to 90% on 4L [place].     Exercises Exercises: Other exercises Other Exercises Other Exercises: Pt educated re: OT role, DME recs, d/c recs, importance of mobility for functional strengthening, ECS.  Other Exercises: LBD, toileting, sup<>sit, bed mobility, sit<>stand, sitting/standing balance/tolerance, PLB   Shoulder Instructions      Home Living Family/patient expects to be discharged to:: Private residence Living Arrangements: Children Available Help at Discharge: Family;Available 24 hours/day (son, DIL, 3 grand children (teenage)) Type of Home: Apartment Home Access: Level entry (3STE or uphill side walk to get to door)     Home Layout: One level     Bathroom Shower/Tub: Teacher, early years/pre: Standard     Home Equipment: Cane - single point;Walker - 2 wheels          Prior Functioning/Environment Level of Independence: Independent with assistive device(s)        Comments: MOD I for ADLs and mobility using RW. Asssit for IADLs. Endorses 2-3 falls this year when walking w/o RW.         OT Problem List: Decreased strength;Decreased range of motion;Decreased activity tolerance;Impaired balance (sitting and/or standing);Decreased safety awareness      OT Treatment/Interventions: Self-care/ADL training;Therapeutic exercise;Energy conservation;DME and/or AE instruction;Therapeutic activities;Patient/family education;Balance training    OT Goals(Current goals can be found in the care plan section) Acute Rehab OT Goals Patient Stated Goal: to go home  OT Goal Formulation: With patient Time For Goal Achievement:  08/07/20 Potential to Achieve Goals: Good ADL Goals Pt Will Perform Grooming: with supervision;standing (c lRAD PRN) Pt Will Perform Lower Body Dressing: with min assist;sit to/from stand (c LRAD PRN) Pt Will Transfer to Toilet: with modified independence;ambulating;bedside commode (c LRAD PRN)  OT Frequency: Min 1X/week   Barriers to D/C:            Co-evaluation PT/OT/SLP Co-Evaluation/Treatment: Yes Reason for Co-Treatment: Complexity of the patient's impairments (multi-system involvement);To address functional/ADL transfers   OT goals addressed during session: ADL's and self-care;Proper use of [employer] equipment and DME      AM-PAC OT "6 Clicks" Daily Activity     Outcome Measure Help from another person eating meals?: None Help from another person taking care of personal grooming?: A Little Help from another person toileting, which includes using toliet, bedpan, or urinal?: A Little Help from another person bathing (including washing, rinsing, drying)?: A Little Help from another person to put on and taking off regular upper body clothing?: A Little Help from another person to put on and taking off regular lower body clothing?: A Lot 6 Click Score: 18   End of Session Equipment Utilized During Treatment: Rolling walker;Oxygen (4L [place])  Activity Tolerance: Patient tolerated treatment well Patient left: in bed;with call bell/phone within reach (pt requesting both rails up for improved mobility )  OT Visit Diagnosis: Unsteadiness on feet (R26.81);Other abnormalities of gait and mobility (R26.89)  Time: 1040-1120 OT Time Calculation (min): 40 min Charges:  OT General Charges $OT Visit: 1 Visit OT Evaluation $OT Eval Moderate Complexity: 1 Mod OT Treatments $Self Care/Home Management : 8-22 mins  Dessie Coma, M.S. OTR/L  07/24/20, 1:18 PM  ascom [phone]

## 2020-07-24 NOTE — Evaluation (Signed)
Physical Therapy Evaluation Patient Details Name: Stanley Marks MRN: [id number] DOB: 1941-08-13 Today's Date: 07/24/2020   History of Present Illness  Patient is a 79 year old gentleman with extensive medical issues including chronic systolic congestive heart failure, A. fib, hypertension, peripheral vascular disease, COPD, obstructive sleep apnea presented to the emergency room with weakness and fall. Sepsis secondary to acute cholecystitis Status post cholecystectomy drain. Acute kidney injury.     Clinical Impression  PT evaluation completed. Patient needs encouragement to participate and was agreeable. Patient needs Min assistance for bed mobility. Min guard for standing from bed, however Min A+2 required for standing from toilet. Patient needs Min guard assistance for ambulating a short distance with rolling walker with activity tolerance limited by fatigue. Sp02 decreased to 80% on room air after walking and slowly increased to 38% after reapplication of 4 L supplemental 02 and seated rest break. Patient declined sitting up in chair at this time and requesting to return to bed. Encouraged LE exercises in bed for strengthening. Recommend HHPT with supervision for mobility at discharge. Continue to recommend PT to address functional limitations listed below to maximize independence and return to PLOF.      Follow Up Recommendations Home health PT;Supervision for mobility/OOB    Equipment Recommendations  Rolling walker with 5" wheels    Recommendations for Other Services       Precautions / Restrictions Precautions Precautions: Fall (drain ) Restrictions Weight Bearing Restrictions: No      Mobility  Bed Mobility Overal bed mobility: Needs Assistance Bed Mobility: Supine to Sit;Sit to Supine     Supine to sit: Min assist Sit to supine: Min assist   General bed mobility comments: intermittent assistance for LE support required for bed mobility. extra time required due to pain  and fatigue with activity   Transfers Overall transfer level: Needs assistance Equipment used: Rolling walker (2 wheeled) Transfers: Sit to/from Stand Sit to Stand:  (Min assistance +2 )         General transfer comment: patient required Min gaurd for standing from bed. Min assistance +2 person required for standing from toilet. Cues for safety and technique   Ambulation/Gait Ambulation/Gait assistance: Min guard Gait Distance (Feet): 25 Feet Assistive device: Rolling walker (2 wheeled) Gait Pattern/deviations: Step-through pattern;Trunk flexed Gait velocity: decreased   General Gait Details: patient fatigued after walking, Sp02 initially 80% on roor air as patient had requsted to mobilize without oxygen in place. after reapplication of 4 B01, BP10 increased to 90% slowly with seated rest break. patient declined attempting to ambulate further due to fatigue   Stairs            Wheelchair Mobility    Modified Rankin (Stroke Patients Only)       Balance Overall balance assessment: Needs assistance Sitting-balance support: Feet supported Sitting balance-Leahy Scale: Fair     Standing balance support: Bilateral upper extremity supported Standing balance-Leahy Scale: Fair Standing balance comment: patient required rolling walker for UE support in standing position                              Pertinent Vitals/Pain Pain Assessment: Faces Faces Pain Scale: Hurts a little bit Pain Location: RLQ drain site Pain Descriptors / Indicators: Aching Pain Intervention(s): Limited activity within patient's tolerance    Home Living Family/patient expects to be discharged to:: Private residence Living Arrangements: Children Available Help at Discharge: Family;Available 24 hours/day Type of Home: [place]  Access: Level entry     Home Layout: One level Home Equipment: Cane - single point;Walker - 2 wheels      Prior Function Level of Independence:  Independent with assistive device(s)         Comments: MOD I for ADLs and mobility using RW. Asssit for IADLs. Endorses 2-3 falls this year when walking w/o RW.      Hand Dominance   Dominant Hand: Right    Extremity/Trunk Assessment   Upper Extremity Assessment Upper Extremity Assessment: Overall WFL for tasks assessed    Lower Extremity Assessment Lower Extremity Assessment: Generalized weakness       Communication   Communication: No difficulties  Cognition Arousal/Alertness: Awake/alert Behavior During Therapy: Flat affect Overall Cognitive Status: Within Functional Limits for tasks assessed                                        General Comments      Exercises Other Exercises Other Exercises: encouraged patient to perform LE exercises in bed including partial SLR, heelslides, and ankle pumps for general strengthening    Assessment/Plan    PT Assessment Patient needs continued PT services  PT Problem List Decreased strength;Decreased activity tolerance;Decreased balance;Decreased mobility;Decreased safety awareness;Decreased knowledge of precautions;Cardiopulmonary status limiting activity       PT Treatment Interventions DME instruction;Gait training;Functional mobility training;Therapeutic activities;Therapeutic exercise;Balance training;Neuromuscular re-education;Patient/family education    PT Goals (Current goals can be found in the Care Plan section)  Acute Rehab PT Goals Patient Stated Goal: to go home  PT Goal Formulation: With patient Time For Goal Achievement: 08/07/20 Potential to Achieve Goals: Good    Frequency Min 2X/week   Barriers to discharge        Co-evaluation PT/OT/SLP Co-Evaluation/Treatment: Yes Reason for Co-Treatment: Complexity of the patient's impairments (multi-system involvement) (limited activity tolerance for multiple mobility sessions  )           AM-PAC PT "6 Clicks" Mobility  Outcome Measure  Help needed turning from your back to your side while in a flat bed without using bedrails?: A Little Help needed moving from lying on your back to sitting on the side of a flat bed without using bedrails?: A Little Help needed moving to and from a bed to a chair (including a wheelchair)?: A Little Help needed standing up from a chair using your arms (e.g., wheelchair or bedside chair)?: A Little Help needed to walk in hospital room?: A Little Help needed climbing 3-5 steps with a railing? : A Lot 6 Click Score: 17    End of Session Equipment Utilized During Treatment: Oxygen Activity Tolerance: Patient limited by fatigue Patient left: in bed;with bed alarm set;with call bell/phone within reach Nurse Communication: Other (comment) (white board updated with current mobility status ) PT Visit Diagnosis: Muscle weakness (generalized) (M62.81);Unsteadiness on feet (R26.81)    Time: 8588-5027 PT Time Calculation (min) (ACUTE ONLY): 23 min   Charges:   PT Evaluation $PT Eval Moderate Complexity: 1 Mod PT Treatments $Therapeutic Activity: 8-22 mins       Minna Merritts, PT, MPT    Percell Locus 07/24/2020, 12:07 PM

## 2020-07-24 NOTE — Progress Notes (Signed)
Stanley Marks  Interval History: Patient seen and examined, no acute events or new complaints overnight. Appears to be tolerating regular diet so far.  Eating some fries but had not touched a hamburger yet.  Clear bile noted in right upper quadrant drain bag.  Review of Systems:  Constitutional: denies fever, chills  Respiratory: denies any shortness of breath  Cardiovascular: denies chest pain or palpitations  Gastrointestinal: denies  N/V, or diarrhea/and bowel function  Musculoskeletal: denies pain,  Integumentary: denies any other rashes or skin discolorations  Vital signs in last 24 hours: [min-max] current  Temp:  [97.5 F (36.Marks C)-97.8 F (36.6 C)] 97.8 F (36.6 C) (08/15 0452) Pulse Rate:  [78-86] 81 (08/15 0834) Resp:  [19-22] 22 (08/15 0452) BP: (117-122)/(76-78) 120/76 (08/15 0834) SpO2:  [93 %-95 %] 93 % (08/15 0452)     Height: 6\' 1"  (185.Marks cm) Weight: (!) 140.7 kg BMI (Calculated): 40.93   Intake/Output last Marks shifts:  08/14 0701 - 08/15 0700 In: 1322.Marks [P.O.:580; I.V.:148.5; IV Piggyback:593.7] Out: 1370 [Urine:1100; Drains:270]   Physical Exam:  Constitutional: alert, cooperative and no distress  Respiratory: breathing non-labored at rest  Cardiovascular: regular rate and sinus rhythm  Gastrointestinal: Well rounded, right upper quadrant drain present with bilious drainage, otherwise soft, non-tender, and non-distended Integumentary: No remarkable gross changes or bruising.  Labs:  CBC Latest Ref Rng & Units 07/23/2020 07/22/2020 07/21/2020  WBC Marks.0 - 10.5 K/uL 10.3 13.3(H) 15.0(H)  Hemoglobin 13.0 - 17.0 g/dL 14.1 13.1 13.Marks  Hematocrit 39 - 52 % 43.9 41.Marks 41.Marks  Platelets 150 - 400 K/uL 232 221 190   CMP Latest Ref Rng & Units 07/24/2020 07/23/2020 07/22/2020  Glucose 70 - 99 mg/dL 108(H) 116(H) 113(H)  BUN 8 - 23 mg/dL 17 17 18   Creatinine 0.61 - 1.24 mg/dL 0.92 0.96  0.92  Sodium 135 - 145 mmol/L 143 141 139  Potassium 3.5 - 5.1 mmol/L Marks.0 Marks.3 Marks.1  Chloride 98 - 111 mmol/L 106 106 105  CO2 22 - 32 mmol/L 26 28 24   Calcium 8.9 - 10.3 mg/dL 8.8(L) 8.7(L) 8.6(L)  Total Protein 6.5 - 8.1 g/dL 6.Marks(L) 6.Marks(L) 6.3(L)  Total Bilirubin 0.3 - 1.Marks mg/dL 1.Marks Marks.0(H) Marks.8(H)  Alkaline Phos 38 - 126 U/L 106 120 119  AST 15 - 41 U/L 12(L) 17 28  ALT 0 - 44 U/L 22 27 36     Imaging studies: No new pertinent imaging studies   Assessment/Plan:  79 y.o. male Marks days,   s/p percutaneous drainage via cholecystostomy tube, complicated by pertinent comorbidities including. Last WBC normal.  T.Bili down to 1.Marks   Patient Active Problem List   Diagnosis Date Noted  . Acute calculous cholecystitis 07/20/2020  . Obesity (BMI 35.0-39.9 without comorbidity)   . PVD (peripheral vascular disease) ([place])   . Wound of left leg   . Acute respiratory failure due to COVID-19 ([place]) 12/22/2019  . COPD with chronic bronchitis ([place]) 12/22/2019  . Atrial fibrillation, chronic ([place]) 12/22/2019  . Acute respiratory failure with hypoxia ([place]) 12/22/2019  . Pneumonia due to COVID-19 virus 12/22/2019  . Atrial fibrillation with RVR ([place])   . Essential hypertension   . Influenza A 01/28/2017  . Pneumonia 01/28/2017  . COPD exacerbation ([place]) 01/28/2017  . Tobacco abuse counseling 01/28/2017  . Patient's noncompliance with other medical treatment and regimen 01/28/2017  . Emphysema of lung ([place]) 01/28/2017  .  Leukocytosis 01/28/2017  . Chronic systolic CHF (congestive heart failure) ([place]) 01/28/2017  . Bradycardia 01/28/2017  . Acute and chronic respiratory failure with hypoxia ([place]) 01/26/2017  . Obstructive sleep apnea syndrome 03/01/2016  . Personal history of other malignant neoplasm of skin 12/08/2014    -Encourage p.o. diet as tolerated  -Continue antibiotics, consider transition to p.o.  -Monitor abdominal examination  -Leukocytosis trending down, further management per  primary service,   -Resume anticoagulation  -- Ronny Bacon, M.D., [facility] 07/24/2020

## 2020-07-24 NOTE — TOC Initial Note (Addendum)
Transition of Care [facility]) - Initial/Assessment Note    Patient Details  Name: Stanley Marks MRN: [id number] Date of Birth: 1941/10/15  Transition of [facility]) CM/SW Contact:    Adelene Amas, [place] Phone Number: 07/24/2020, 10:21 AM  Clinical Narrative:                  Patient presents to [facility] due to fall and weakness.  Attending recommending home health, patient has cholecystomy drain.  CSW called and left voicemail to speak with granddaughter Gillermo Murdoch [phone], disposition status.    CSW spoke with Corene Cornea, [facility] and they will be able to treat the patient.   Expected Discharge Plan: [place]     Patient Goals and CMS Choice Patient states their goals for this hospitalization and ongoing recovery are:: Go home with home health. CMS Medicare.gov Compare Post Acute Care list provided to:: Patient Represenative (must comment) Gillermo Murdoch (Granddaughter) [phone]) Choice offered to / list presented to : Adult Children ([place]Six daughter)  Expected Discharge Plan and Services Expected Discharge Plan: [place] In-house Referral: Clinical Social Work   Post Acute Care Choice: [place] arrangements for the past 2 months: Single Family Home                           HH Arranged: PT, RN          Prior Living Arrangements/Services Living arrangements for the past 2 months: Single Family Home Lives with:: Self                   Activities of Daily Living Home Assistive Devices/Equipment: Environmental consultant (specify type), Cane (specify quad or straight) ADL Screening (condition at time of admission) Patient's cognitive ability adequate to safely complete daily activities?: Yes Is the patient deaf or have difficulty hearing?: No Does the patient have difficulty seeing, even when wearing glasses/contacts?: No Does the patient have difficulty concentrating, remembering, or making decisions?:  No Patient able to express need for assistance with ADLs?: Yes Does the patient have difficulty dressing or bathing?: Yes Independently performs ADLs?: Yes (appropriate for developmental age) Does the patient have difficulty walking or climbing stairs?: Yes Weakness of Legs: Both Weakness of Arms/Hands: None  Permission Sought/Granted                  Emotional Assessment              Admission diagnosis:  Acute calculous cholecystitis [K80.00] Abdominal pain [R10.9] Patient Active Problem List   Diagnosis Date Noted  . Acute calculous cholecystitis 07/20/2020  . Obesity (BMI 35.0-39.9 without comorbidity)   . PVD (peripheral vascular disease) ([place])   . Wound of left leg   . Acute respiratory failure due to COVID-19 ([place]) 12/22/2019  . COPD with chronic bronchitis ([place]) 12/22/2019  . Atrial fibrillation, chronic ([place]) 12/22/2019  . Acute respiratory failure with hypoxia ([place]) 12/22/2019  . Pneumonia due to COVID-19 virus 12/22/2019  . Atrial fibrillation with RVR ([place])   . Essential hypertension   . Influenza A 01/28/2017  . Pneumonia 01/28/2017  . COPD exacerbation ([place]) 01/28/2017  . Tobacco abuse counseling 01/28/2017  . Patient's noncompliance with other medical treatment and regimen 01/28/2017  . Emphysema of lung ([place]) 01/28/2017  . Leukocytosis 01/28/2017  . Chronic systolic CHF (congestive heart failure) ([place]) 01/28/2017  . Bradycardia 01/28/2017  . Acute and chronic respiratory failure with hypoxia (  [place]) 01/26/2017  . Obstructive sleep apnea syndrome 03/01/2016  . Personal history of other malignant neoplasm of skin 12/08/2014   PCP:  Perrin Maltese, MD Pharmacy:   [place], [place] - [address] [facility] [address] [place] [address] Phone: [phone] Fax: [phone]     Social Determinants of Health (SDOH) Interventions    Readmission Risk Interventions Readmission Risk Prevention Plan 01/26/2020   Transportation Screening Complete  PCP or Specialist Appt within 3-5 Days Complete  HRI or Home Care Consult Complete  Palliative Care Screening Not Applicable  Medication Review (RN Care Manager) Complete  Some recent data might be hidden

## 2020-07-24 NOTE — Discharge Summary (Signed)
Physician Discharge Summary  Patient ID: Stanley Marks MRN: [id number] DOB/AGE: 1941-01-20 79 y.o.  Admit date: 07/20/2020 Discharge date: 07/24/2020  Admission Diagnoses:  Discharge Diagnoses:  Principal Problem:   Acute calculous cholecystitis Active Problems:   Chronic systolic CHF (congestive heart failure) (HCC)   COPD with chronic bronchitis (HCC)   Atrial fibrillation, chronic (HCC)   Discharged Condition: good  Hospital Course:  79 year old gentleman with extensive medical issues including chronic systolic congestive heart failure, A. fib on Xarelto, hypertension, peripheral vascular disease, COPD, obstructive sleep apnea presented to the emergency room with weakness and fall. Recently seen at the emergency room with cholelithiasis concerning for cholecystitis. He has been feeling weak and not able to eat much so came back to the emergency room. In the emergency room he is found to have leukocytosis. Abdominal ultrasound again showedequivocal for acute cholecystitis. Admitted with surgical consultation. MRCP with no bile duct abnormalities. 8/13, IR guided percutaneous cholecystostomy.  8/14.  Gram stain from cholecystostomy drain showed abundant gram-negative rods, moderate amount of gram-positive cocci and a rare gram positive rods.  Culture pending.  Patient feels better today.  Continue antibiotics with Rocephin and Flagyl.  8/15.  Blood culture came back no growth.  Culture from drain come back with E. coli, pansensitive.  We will continue 7 days of Augmentin.  We will keep the drain until seen by general surgery and interventional radiology.  We will also set up with home care, home physical therapy.  Patient has been refusing nursing home placement.   #1.  Sepsis secondary to acute cholecystitis. Status post cholecystectomy drain.  Blood culture negative, culture from drain fluid positive for E. coli pansensitive.  Continue Augmentin for 7 more days.  #2.  Acute  kidney injury. Renal function improved.  3.  Chronic atrial fibrillation. Rate controlled.  Restarted Xarelto.  4.  COPD with chronic hypoxemic respiratory failure. Patient was chronically on 2 to 3 L oxygen.  Continue oxygen treatment.  5.  Chronic systolic congestive heart failure. Resume all home medicines.   Consults: general surgery  Significant Diagnostic Studies:  ULTRASOUND ABDOMEN LIMITED RIGHT UPPER QUADRANT  COMPARISON:  07/17/2020  FINDINGS: Gallbladder:  There is gallbladder wall thickening with the gallbladder wall measuring up to approximately 6 mm. The sonographic Percell Miller sign is negative. There are gallstones. The gallbladder appears to be distended.  Common bile duct:  Diameter: 5 mm  Liver:  Diffuse increased echogenicity with slightly heterogeneous liver. Appearance typically secondary to fatty infiltration. Fibrosis secondary consideration. No secondary findings of cirrhosis noted. No focal hepatic lesion or intrahepatic biliary duct dilatation. Portal vein is patent on color Doppler imaging with normal direction of blood flow towards the liver.  Other: None.  IMPRESSION: 1. Findings are again equivocal for acute cholecystitis. If there is high clinical suspicion, follow-up with HIDA scan is recommended. 2. Hepatic steatosis.   Electronically Signed   By: Constance Holster M.D.   On: 07/20/2020 17:48  MRI ABDOMEN WITHOUT CONTRAST  (INCLUDING MRCP)  TECHNIQUE: Multiplanar multisequence MR imaging of the abdomen was performed. Heavily T2-weighted images of the biliary and pancreatic ducts were obtained, and three-dimensional MRCP images were rendered by post processing.  COMPARISON:  Abdominal ultrasound 1121.  FINDINGS: Exam is degraded by mild to moderate motion and lack of IV contrast.  Lower chest: Moderate cardiomegaly. Trace pleural fluid is likely physiologic.  Hepatobiliary: Mild hepatic steatosis. No  suspicious liver lesion. No intrahepatic biliary duct dilatation.  The gallbladder is mildly distended. Small stones  within. Gallbladder wall thickening and irregularity, including on 26/7. Mild pericholecystic edema.  The common duct measures on the order of 4 mm on 41/13, normal. No choledocholithiasis given motion degradation.  Pancreas:  Normal, without mass or ductal dilatation.  Spleen:  Normal in size, without focal abnormality.  Adrenals/Urinary Tract: Normal right adrenal gland. Mild left adrenal thickening and nodularity, likely due to an adenoma 2.1 cm on 21/4.  T2 hyperintense bilateral renal lesions are likely cysts but incompletely characterized.  A posterior interpolar right renal T2 hypointense 1.3 cm lesion on 13/2 is mildly heterogeneous T1 signal on 68/16. Similar in size on the CT of 03/05/2019. No hydronephrosis.  Stomach/Bowel: Normal stomach and abdominal bowel loops.  Vascular/Lymphatic: The infrarenal aorta is tortuous, dilated at up to 3.2 cm on 21/2.  Prominent nodes including a porta hepatis node of 1.2 cm on 19/7. Similar to 03/05/2019. 7 mm right retrocrural node on this image.  Aortocaval index node of 1.1 cm on 31/7.  Other: Nonspecific mesenteric edema with anasarca, possibly representing fluid overload.  Musculoskeletal: Convex left lumbar spine curvature.  IMPRESSION: 1. Degraded exam secondary to motion and lack of IV contrast. 2. Findings suspicious for acute cholecystitis. No choledocholithiasis or biliary duct dilatation. 3. 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 Recommend follow-up every 3 years. This recommendation follows ACR consensus guidelines: White Paper of the ACR Incidental Findings Committee II on Vascular Findings. J Am Coll Radiol 2013; 10:789-794. 4. Prominent but not pathologically enlarged upper abdominal nodes are most likely reactive in the setting of steatosis. 5. Right renal lesion is  similar in size to 03/05/2019, incompletely characterized. Most likely a hemorrhagic cyst. Solid neoplasm such as papillary type renal cell carcinoma cannot be excluded. Consider renal ultrasound to allow surveillance. Alternatively, if the patient can undergo pre and postcontrast CT or MRI, this should be considered (when patient is able to follow directions and breath hold).   Electronically Signed   By: Abigail Miyamoto M.D.   On: 07/21/2020 14:32  Treatments: Cholecystostomy  Discharge Exam: Blood pressure 120/76, pulse 81, temperature 97.8 F (36.6 C), temperature source Oral, resp. rate (!) 22, height 6\' 1"  (1.854 m), weight (!) 140.7 kg, SpO2 93 %. General appearance: alert and cooperative Resp: clear to auscultation bilaterally Cardio: Irregular, no murmurs. GI: soft, non-tender; bowel sounds normal; no masses,  no organomegaly Extremities: extremities normal, atraumatic, no cyanosis or edema  Disposition: Discharge disposition: 01-Home or Self Care       Discharge Instructions    Diet - low sodium heart healthy   Complete by: As directed    Discharge wound care:   Complete by: As directed    Follow by home care.   Increase activity slowly   Complete by: As directed      Allergies as of 07/24/2020      Reactions   Lipitor [atorvastatin Calcium] Other (See Comments)   arthralgia      Medication List    TAKE these medications   Advair HFA 230-21 MCG/ACT inhaler Generic drug: fluticasone-salmeterol Inhale 1 puff into the lungs 2 (two) times daily as needed.   albuterol 108 (90 Base) MCG/ACT inhaler Commonly known as: VENTOLIN HFA Inhale 2 puffs into the lungs every 6 (six) hours as needed for wheezing or shortness of breath.   amiodarone 200 MG tablet Commonly known as: PACERONE Take 2 tablets (400 mg total) by mouth 2 (two) times daily.   amoxicillin-clavulanate 875-125 MG tablet Commonly known as: Augmentin Take  1 tablet by mouth 2 (two) times daily  for 7 days. What changed: You were already taking a medication with the same name, and this prescription was added. Make sure you understand how and when to take each.   amoxicillin-clavulanate 875-125 MG tablet Commonly known as: Augmentin Take 1 tablet by mouth 2 (two) times daily for 7 days. What changed: Another medication with the same name was added. Make sure you understand how and when to take each.   digoxin 0.125 MG tablet Commonly known as: LANOXIN Take 1 tablet (0.125 mg total) by mouth every other day.   Entresto 49-51 MG Generic drug: sacubitril-valsartan Take 1 tablet by mouth 2 (two) times daily.   metoprolol succinate 50 MG 24 hr tablet Commonly known as: TOPROL-XL Take 1 tablet (50 mg total) by mouth in the morning and at bedtime. Take with or immediately following a meal.   ondansetron 4 MG disintegrating tablet Commonly known as: Zofran ODT Take 1 tablet (4 mg total) by mouth every 8 (eight) hours as needed for nausea or vomiting.   Rivaroxaban 15 MG Tabs tablet Commonly known as: XARELTO Take 15 mg by mouth daily with supper.   torsemide 20 MG tablet Commonly known as: DEMADEX Take 20 mg by mouth daily.   traMADol 50 MG tablet Commonly known as: Ultram Take 1 tablet (50 mg total) by mouth every 6 (six) hours as needed.            Durable Medical Equipment  (From admission, onward)         Start     Ordered   07/24/20 1213  For home use only DME Bedside commode  Once       Question:  Patient needs a bedside commode to treat with the following condition  Answer:  Cholecystitis   07/24/20 1212           Discharge Care Instructions  (From admission, onward)         Start     Ordered   07/24/20 0000  Discharge wound care:       Comments: Follow by home care.   07/24/20 1221          Follow-up Information    Arne Cleveland, MD Follow up.   Specialties: Interventional Radiology, Radiology Why: IR scheduler will call you with  appointment date/time for your drain follow up (approximately 6-8 weeks from placement). Please call with any questions or concerns prior to your appointment. Contact information: [name] [place] [address] [phone]        Perrin Maltese, MD Follow up in 1 week(s).   Specialty: Internal Medicine Contact information: [place] [address] [phone]        Fredirick Maudlin, MD Follow up in 1 week(s).   Specialty: General Surgery Contact information: [place] [address] [phone]              35 minutes Signed: Sharen Hones 07/24/2020, 12:28 PM

## 2020-07-25 ENCOUNTER — Other Ambulatory Visit: Payer: Self-pay | Admitting: General Surgery

## 2020-07-25 DIAGNOSIS — K819 Cholecystitis, unspecified: Secondary | ICD-10-CM

## 2020-07-25 LAB — CULTURE, BLOOD (ROUTINE X 2)
Culture: NO GROWTH
Culture: NO GROWTH
Special Requests: ADEQUATE

## 2020-07-27 ENCOUNTER — Telehealth: Payer: Self-pay

## 2020-07-27 LAB — AEROBIC/ANAEROBIC CULTURE W GRAM STAIN (SURGICAL/DEEP WOUND)

## 2020-07-27 NOTE — Telephone Encounter (Signed)
Tried reaching out to patient at this time- unable to leave message- appointment needs to be cancelled 08/02/20 per Dr.cannon- Patient has drain study scheduled and can follow up if patient feels the need to see Dr. Celine Ahr. Appointment has been cancelled.

## 2020-07-28 ENCOUNTER — Other Ambulatory Visit: Payer: Self-pay

## 2020-07-28 ENCOUNTER — Emergency Department: Payer: Medicare Other

## 2020-07-28 ENCOUNTER — Encounter: Payer: Self-pay | Admitting: Emergency Medicine

## 2020-07-28 DIAGNOSIS — I5022 Chronic systolic (congestive) heart failure: Secondary | ICD-10-CM | POA: Insufficient documentation

## 2020-07-28 DIAGNOSIS — Z79899 Other long term (current) drug therapy: Secondary | ICD-10-CM | POA: Diagnosis not present

## 2020-07-28 DIAGNOSIS — F17210 Nicotine dependence, cigarettes, uncomplicated: Secondary | ICD-10-CM | POA: Insufficient documentation

## 2020-07-28 DIAGNOSIS — J449 Chronic obstructive pulmonary disease, unspecified: Secondary | ICD-10-CM | POA: Insufficient documentation

## 2020-07-28 DIAGNOSIS — K6289 Other specified diseases of anus and rectum: Secondary | ICD-10-CM | POA: Diagnosis not present

## 2020-07-28 DIAGNOSIS — K5900 Constipation, unspecified: Secondary | ICD-10-CM | POA: Diagnosis not present

## 2020-07-28 DIAGNOSIS — I110 Hypertensive heart disease with heart failure: Secondary | ICD-10-CM | POA: Insufficient documentation

## 2020-07-28 LAB — COMPREHENSIVE METABOLIC PANEL
ALT: 21 U/L (ref 0–44)
AST: 20 U/L (ref 15–41)
Albumin: 2.9 g/dL — ABNORMAL LOW (ref 3.5–5.0)
Alkaline Phosphatase: 122 U/L (ref 38–126)
Anion gap: 10 (ref 5–15)
BUN: 15 mg/dL (ref 8–23)
CO2: 25 mmol/L (ref 22–32)
Calcium: 8.6 mg/dL — ABNORMAL LOW (ref 8.9–10.3)
Chloride: 101 mmol/L (ref 98–111)
Creatinine, Ser: 1.1 mg/dL (ref 0.61–1.24)
GFR calc Af Amer: >60 mL/min (ref >60)
GFR calc non Af Amer: >60 mL/min (ref >60)
Glucose, Bld: 142 mg/dL — ABNORMAL HIGH (ref 70–99)
Potassium: 4.3 mmol/L (ref 3.5–5.1)
Sodium: 136 mmol/L (ref 135–145)
Total Bilirubin: 1.2 mg/dL (ref 0.3–1.2)
Total Protein: 7 g/dL (ref 6.5–8.1)

## 2020-07-28 LAB — CBC WITH DIFFERENTIAL/PLATELET
Abs Immature Granulocytes: 0.17 10*3/uL — ABNORMAL HIGH (ref 0.00–0.07)
Basophils Absolute: 0.1 10*3/uL (ref 0.0–0.1)
Basophils Relative: 0 %
Eosinophils Absolute: 0.1 10*3/uL (ref 0.0–0.5)
Eosinophils Relative: 0 %
HCT: 48.4 % (ref 39.0–52.0)
Hemoglobin: 16.1 g/dL (ref 13.0–17.0)
Immature Granulocytes: 1 %
Lymphocytes Relative: 6 %
Lymphs Abs: 0.9 10*3/uL (ref 0.7–4.0)
MCH: 29.9 pg (ref 26.0–34.0)
MCHC: 33.3 g/dL (ref 30.0–36.0)
MCV: 90 fL (ref 80.0–100.0)
Monocytes Absolute: 0.4 10*3/uL (ref 0.1–1.0)
Monocytes Relative: 3 %
Neutro Abs: 12.8 10*3/uL — ABNORMAL HIGH (ref 1.7–7.7)
Neutrophils Relative %: 90 %
Platelets: 321 10*3/uL (ref 150–400)
RBC: 5.38 MIL/uL (ref 4.22–5.81)
RDW: 16.8 % — ABNORMAL HIGH (ref 11.5–15.5)
WBC: 14.3 10*3/uL — ABNORMAL HIGH (ref 4.0–10.5)
nRBC: 0 % (ref 0.0–0.2)

## 2020-07-28 NOTE — ED Triage Notes (Signed)
Pt presents to ED via [facility] with c/o rectal pain. Pt states has not had a BM in 5 days. Pt recently admitted for drain tube to gall bladder due to infection. Pt c/o 9/10 sharp rectal pain.   Chronic 4L  at this time.

## 2020-07-28 NOTE — ED Triage Notes (Signed)
First RN note: rectal pain 9/10 x 3 days. Per EMS pt has seen and treated for galbladder infection with drain. Per EMS pt treated with miralax and enema without relief. Chronic 3L via [place]. Per EMS pt has not had a BM x 5 days.

## 2020-07-29 ENCOUNTER — Emergency Department
Admission: EM | Admit: 2020-07-29 | Discharge: 2020-07-29 | Disposition: A | Discharge status: Home / Self Care | Payer: Medicare Other | Attending: Emergency Medicine | Admitting: Emergency Medicine

## 2020-07-29 DIAGNOSIS — K5900 Constipation, unspecified: Secondary | ICD-10-CM

## 2020-07-29 MED ORDER — MAGNESIUM CITRATE PO SOLN
0.5000 | Freq: Once | ORAL | Status: AC
  Administered 2020-07-29: 0.5 via ORAL
  Filled 2020-07-29: qty 296

## 2020-07-29 MED ORDER — DOCUSATE SODIUM 100 MG PO CAPS
100.0000 mg | ORAL_CAPSULE | Freq: Once | ORAL | Status: AC
  Administered 2020-07-29: 100 mg via ORAL
  Filled 2020-07-29: qty 1

## 2020-07-29 MED ORDER — LACTULOSE 10 GM/15ML PO SOLN
20.0000 g | Freq: Every day | ORAL | 0 refills | Status: DC | PRN
Start: 2020-07-29 — End: 2021-05-18

## 2020-07-29 MED ORDER — LACTULOSE 10 GM/15ML PO SOLN
30.0000 g | Freq: Once | ORAL | Status: AC
  Administered 2020-07-29: 30 g via ORAL
  Filled 2020-07-29: qty 60

## 2020-07-29 MED ORDER — LIDOCAINE HCL URETHRAL/MUCOSAL 2 % EX GEL
1.0000 "application " | Freq: Once | CUTANEOUS | Status: AC
  Administered 2020-07-29: 1 via TOPICAL
  Filled 2020-07-29: qty 10

## 2020-07-29 NOTE — ED Notes (Signed)
Brief changed post BM.

## 2020-07-29 NOTE — ED Provider Notes (Signed)
[facility] Emergency Department Provider Note   ____________________________________________   First MD Initiated Contact with Patient 07/29/20 [phone]     (approximate)  I have reviewed the triage vital signs and the nursing notes.   HISTORY  Chief Complaint Rectal Pain and Constipation    HPI Stanley Marks is a 79 y.o. male brought to the ED via EMS from home with a chief complaint of constipation and rectal pain.  Patient has not had a BM in 5 days.  Usually has a BM every day.  Patient with recent hospitalization for cholecystitis status post cholecystostomy drain.  Has been taking narcotic pain medications.  Denies fever, cough, chest pain, shortness of breath, abdominal pain, nausea or vomiting.  Has tried MiraLAX and enema at home without relief of symptoms.      Past Medical History:  Diagnosis Date  . Basal cell carcinoma 08/24/2014   chin, L nose ant alar crease  . Cancer ([place])    skin  . Emphysema of lung ([place])   . Hypertension   . PVD (peripheral vascular disease) ([place])   . 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 Squamous cell carcinoma of skin 01/17/2015   R prox lateral dorsum of hand    Patient Active Problem List   Diagnosis Date Noted  . Acute calculous cholecystitis 07/20/2020  . Obesity (BMI 35.0-39.9 without comorbidity)   . PVD (peripheral vascular disease) ([place])   . Wound of left leg   . Acute respiratory failure due to COVID-19 ([employer]) 12/22/2019  . 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 Atrial fibrillation, chronic ([place]) 12/22/2019  . Acute respiratory failure with hypoxia ([place]) 12/22/2019  . Pneumonia due to COVID-19 virus 12/22/2019  . Atrial fibrillation with RVR ([place])   . Essential hypertension   . Influenza A 01/28/2017  . Pneumonia 01/28/2017  . COPD exacerbation ([place]) 01/28/2017  . Tobacco abuse counseling 01/28/2017  . Patient's noncompliance with other medical  treatment and regimen 01/28/2017  . Emphysema of lung ([place]) 01/28/2017  . Leukocytosis 01/28/2017  . Chronic systolic CHF (congestive heart failure) ([place]) 01/28/2017  . Bradycardia 01/28/2017  . Acute and chronic respiratory failure with hypoxia ([place]) 01/26/2017  . Obstructive sleep apnea syndrome 03/01/2016  . Personal history of other malignant neoplasm of skin 12/08/2014    Past Surgical History:  Procedure Laterality Date  . APPENDECTOMY    . arm surgery    . ELECTROPHYSIOLOGIC STUDY N/A 11/20/2016   Procedure: CARDIOVERSION;  Surgeon: Dionisio David, MD;  Location: [facility] ORS;  Service: Cardiovascular;  Laterality: N/A;  . IR PERC CHOLECYSTOSTOMY  07/22/2020    Prior to Admission medications   Medication Sig Start Date End Date Taking? Authorizing Provider  ADVAIR HFA 230-21 MCG/ACT inhaler Inhale 1 puff into the lungs 2 (two) times daily as needed.  12/17/19   [provider]  albuterol (VENTOLIN HFA) 108 (90 Base) MCG/ACT inhaler Inhale 2 puffs into the lungs every 6 (six) hours as needed for wheezing or shortness of breath. 12/21/19   Vanessa [place], MD  amiodarone (PACERONE) 200 MG tablet Take 2 tablets (400 mg total) by mouth 2 (two) times daily. 01/28/20   Loletha Grayer, MD  amoxicillin-clavulanate (AUGMENTIN) 875-125 MG tablet Take 1 tablet by mouth 2 (two) times daily for 7 days. 07/24/20 07/31/20  Sharen Hones, MD  digoxin (LANOXIN) 0.125 MG tablet Take 1 tablet (0.125 mg total) by mouth every other day. 01/28/20  Loletha Grayer, MD  lactulose (CHRONULAC) 10 GM/15ML solution Take 30 mLs (20 g total) by mouth daily as needed for mild constipation. 07/29/20   Paulette Blanch, MD  metoprolol succinate (TOPROL-XL) 50 MG 24 hr tablet Take 1 tablet (50 mg total) by mouth in the morning and at bedtime. Take with or immediately following a meal. 01/28/20   Loletha Grayer, MD  ondansetron (ZOFRAN ODT) 4 MG disintegrating tablet Take 1 tablet (4 mg total) by mouth every 8 (eight)  hours as needed for nausea or vomiting. 07/17/20   Paulette Blanch, MD  Rivaroxaban (XARELTO) 15 MG TABS tablet Take 15 mg by mouth daily with supper.    [provider]  sacubitril-valsartan (ENTRESTO) 49-51 MG Take 1 tablet by mouth 2 (two) times daily.    [provider]  torsemide (DEMADEX) 20 MG tablet Take 20 mg by mouth daily.    [provider]  traMADol (ULTRAM) 50 MG tablet Take 1 tablet (50 mg total) by mouth every 6 (six) hours as needed. 07/17/20   Paulette Blanch, MD    Allergies Lipitor [atorvastatin calcium]  History reviewed. No pertinent family history.  Social History Social History   Tobacco Use  . Smoking status: Current Every Day Smoker    Packs/day: 0.00    Types: Cigarettes  . Smokeless tobacco: Never Used  Substance Use Topics  . 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 Drug use: Never    Review of Systems  Constitutional: No fever/chills Eyes: No visual changes. ENT: No sore throat. Cardiovascular: Denies chest pain. Respiratory: Denies shortness of breath. Gastrointestinal: No abdominal pain.  No nausea, no vomiting.  No diarrhea.  Positive for constipation. Genitourinary: Negative for dysuria. Musculoskeletal: Negative for back pain. Skin: Negative for rash. Neurological: Negative for headaches, focal weakness or numbness.   ____________________________________________   PHYSICAL EXAM:  VITAL SIGNS: ED Triage Vitals  Enc Vitals Group     BP 07/28/20 1645 136/86     Pulse Rate 07/28/20 1645 81     Resp 07/28/20 1645 20     Temp 07/28/20 1645 97.7 F (36.5 C)     Temp Source 07/28/20 1645 Oral     SpO2 07/28/20 1645 94 %     Weight 07/28/20 1646 (!) 310 lb 3.2 oz (140.7 kg)     Height 07/28/20 1646 6\' 1"  (1.854 m)     Head Circumference --      Peak Flow --      Pain Score 07/28/20 1646 10     Pain Loc --      Pain Edu? --      Excl. in [place]? --     Constitutional: Alert and  oriented. Well appearing and in no acute distress. Eyes: Conjunctivae are normal. PERRL. EOMI. Head: Atraumatic. Nose: No congestion/rhinnorhea. Mouth/Throat: Mucous membranes are moist.  Oropharynx non-erythematous. Neck: No stridor.   Cardiovascular: Normal rate, regular rhythm. Grossly normal heart sounds.  Good peripheral circulation. Respiratory: Normal respiratory effort.  No retractions. Lungs CTAB. Gastrointestinal: Soft and nontender to light or deep palpation.  Mild distention.  Cholecystostomy drain in place.  No abdominal bruits. No CVA tenderness. Musculoskeletal: No lower extremity tenderness nor edema.  No joint effusions. Neurologic:  Normal speech and language. No gross focal neurologic deficits are appreciated. No gait instability. Skin:  Skin is warm, dry and intact. No rash noted. Psychiatric: Mood and affect are normal. Speech  and behavior are normal.  ____________________________________________   LABS (all labs ordered are listed, but only abnormal results are displayed)  Labs Reviewed  CBC WITH DIFFERENTIAL/PLATELET - Abnormal; Notable for the following components:      Result Value   WBC 14.3 (*)    RDW 16.8 (*)    Neutro Abs 12.8 (*)    Abs Immature Granulocytes 0.17 (*)    All other components within normal limits  COMPREHENSIVE METABOLIC PANEL - Abnormal; Notable for the following components:   Glucose, Bld 142 (*)    Calcium 8.6 (*)    Albumin 2.9 (*)    All other components within normal limits   ____________________________________________  EKG  None ____________________________________________  RADIOLOGY  ED MD interpretation: Moderate stool burden  Official radiology report(s): DG Abdomen 1 View  Result Date: 07/28/2020 CLINICAL DATA:  79 year old male with constipation. EXAM: ABDOMEN - 1 VIEW COMPARISON:  None. FINDINGS: There is moderate stool throughout the colon. There is no bowel dilatation or evidence of obstruction. No free air.  Probable tiny left renal calculi. Right upper quadrant percutaneous pigtail drainage catheter. There is degenerative changes of the spine. No acute osseous pathology. IMPRESSION: Moderate colonic stool burden.  No bowel obstruction. Electronically Signed   By: Anner Crete M.D.   On: 07/28/2020 17:58    ____________________________________________   PROCEDURES  Procedure(s) performed (including Critical Care):  Procedures   ____________________________________________   INITIAL IMPRESSION / ASSESSMENT AND PLAN / ED COURSE  As part of my medical decision making, I reviewed the following data within the [place] notes reviewed and incorporated, Labs reviewed, Old chart reviewed, Radiograph reviewed and Notes from prior ED visits     Chao Sava was evaluated in Emergency Department on 07/29/2020 for the symptoms described in the history of present illness. He was evaluated in the context of the global COVID-19 pandemic, which necessitated consideration that the patient might be at risk for infection with the SARS-CoV-2 virus that causes COVID-19. Institutional protocols and algorithms that pertain to the evaluation of patients at risk for COVID-19 are in a state of rapid change based on information released by regulatory bodies including the CDC and federal and state organizations. These policies and algorithms were followed during the patient's care in the ED.    79 year old male presenting with constipation likely secondary to opiate use given recent hospitalization for cholecystitis status post cholecystostomy drain.  Lab work notable for moderate leukocytosis likely secondary to stress reaction.  Patient states he is still currently on antibiotics.  There is no evidence of infection externally at the cholecystostomy site and his abdomen is nontender to palpation.  Offered soapsuds enema which patient accepts.   Clinical Course as of Jul 29 632  Fri Jul 29, 2020  0627 Patient had several large and satisfactory bowel movements.  Feels significantly better and is eager for discharge home.  Will discharge home with prescription for lactulose and recommendations for daily bowel regimen.  Strict return precautions given.  Patient verbalizes understanding and agrees with plan of care.   [JS]    Clinical Course User Index [JS] Paulette Blanch, MD     ____________________________________________   FINAL CLINICAL IMPRESSION(S) / ED DIAGNOSES  Final diagnoses:  Constipation, unspecified constipation type     ED Discharge Orders         Ordered    lactulose (CHRONULAC) 10 GM/15ML solution  Daily PRN        07/29/20 1610  Note:  This document was prepared using Dragon voice recognition software and may include unintentional dictation errors.   Paulette Blanch, MD 07/29/20 [phone]

## 2020-07-29 NOTE — ED Notes (Signed)
Pt ambulated back to stretcher and hooked back up to monitor.

## 2020-07-29 NOTE — ED Notes (Signed)
Pt assisted into a new gown. Pt provided water. Pt was also ambulated to the bathroom. NT told pt to pull call light when finished.

## 2020-07-29 NOTE — ED Notes (Signed)
This RN to bedside, introduced self to patient, explained waiting for EMS to transport patient back home. Pt states understanding. Pt call bell placed within reach of patient at this time, explained to use if patient needed to get up. Pt requesting water at this time. Pt A&O x4.

## 2020-07-29 NOTE — Discharge Instructions (Addendum)
1.  You may take Lactulose as needed for bowel movements. 2.  I recommend the following over-the-counter medications to maintain daily bowel movements: MiraLAX Stool softener such as Colace Fiber Drink plenty of fluids 3.  Return to the ER for worsening symptoms, persistent vomiting, difficulty breathing or other concerns.

## 2020-07-29 NOTE — ED Notes (Signed)
EMS arrived to transport patient at this time.

## 2020-07-29 NOTE — ED Notes (Signed)
Pts brief changed. Pt dressed in gown to go home in due to his soiled. Pt is non-ambulatory as of recent due to surgical procedure. Pt requires EMS transport back to home due to family not able to provide safe transport.

## 2020-07-29 NOTE — ED Notes (Signed)
Pt on bedside toilet. Pt instructed to pull cord when finished with BM.

## 2020-07-29 NOTE — ED Notes (Signed)
Pt assisted back to bed from toilet. Pt assisted into supine position at this time, provided with blankets per his request. Pt denies further needs, call bell within reach of patient at this time.

## 2020-08-02 ENCOUNTER — Ambulatory Visit: Payer: Self-pay | Admitting: General Surgery

## 2020-08-18 ENCOUNTER — Ambulatory Visit (HOSPITAL_COMMUNITY)
Admission: RE | Admit: 2020-08-18 | Discharge: 2020-08-18 | Disposition: A | Discharge status: Home / Self Care | Payer: Medicare Other | Source: Ambulatory Visit | Attending: General Surgery | Admitting: General Surgery

## 2020-08-18 ENCOUNTER — Other Ambulatory Visit: Payer: Self-pay

## 2020-08-18 ENCOUNTER — Other Ambulatory Visit: Payer: Medicare Other

## 2020-08-18 DIAGNOSIS — K819 Cholecystitis, unspecified: Secondary | ICD-10-CM | POA: Insufficient documentation

## 2020-08-18 HISTORY — PX: IR CHOLANGIOGRAM EXISTING TUBE: IMG6040

## 2020-08-18 MED ORDER — LIDOCAINE HCL 1 % IJ SOLN
INTRAMUSCULAR | Status: AC
  Filled 2020-08-18: qty 20

## 2020-08-18 MED ORDER — IOHEXOL 300 MG/ML  SOLN
50.0000 mL | Freq: Once | INTRAMUSCULAR | Status: AC | PRN
  Administered 2020-08-18: 10 mL

## 2020-08-18 NOTE — Procedures (Signed)
Pre procedural Dx: Cholecysitis Post procedural Dx: Same  - Appropriately positioned and functioning cholecystostomy tube.  No exchange performed.   - Cholelithiasis without evidence of choledocholithiasis.   EBL: None Complications: None immediate  PLAN:   - the patient's cholecystostomy tube was capped for a trial of internalization.  The patient was instructed to no longer flush the cholecystostomy tube.   - The patient was given an extra gravity bag and instructed to reconnect the cholecystostomy tube to the gravity bag if she were to experience recurrent right upper quadrant obstructive symptoms.  The patient demonstrated fair understanding of this discussion.   - The patient will return to [facility] next week for repeat cholangiogram.  As patient is a poor operative candidate, per my discussion with referring surgeon, Dr. Celine Ahr, if the patient passes his trial of internalization, the cholecystostomy tube may be removed.   Ronny Bacon, MD Pager #: [phone]

## 2020-08-19 ENCOUNTER — Other Ambulatory Visit: Payer: Self-pay | Admitting: Interventional Radiology

## 2020-08-19 DIAGNOSIS — K819 Cholecystitis, unspecified: Secondary | ICD-10-CM

## 2020-08-24 ENCOUNTER — Other Ambulatory Visit: Payer: Self-pay

## 2020-08-24 ENCOUNTER — Ambulatory Visit
Admission: RE | Admit: 2020-08-24 | Discharge: 2020-08-24 | Disposition: A | Discharge status: Home / Self Care | Payer: Medicare Other | Source: Ambulatory Visit | Attending: Interventional Radiology | Admitting: Interventional Radiology

## 2020-08-24 DIAGNOSIS — K819 Cholecystitis, unspecified: Secondary | ICD-10-CM | POA: Diagnosis not present

## 2020-08-24 MED ORDER — IOHEXOL 300 MG/ML  SOLN
10.0000 mL | Freq: Once | INTRAMUSCULAR | Status: AC | PRN
  Administered 2020-08-24: 10 mL

## 2020-08-24 MED ORDER — SODIUM CHLORIDE (PF) 0.9 % IJ SOLN
5.0000 mL | Freq: Once | INTRAMUSCULAR | Status: AC
  Administered 2020-08-24: 5 mL

## 2020-08-24 NOTE — Procedures (Signed)
Pre procedural Dx: Acute cholecysitis Post procedural Dx: Same  - Appropriately positioned and functioning cholecystostomy tube.   - Cholelithiasis without evidence of choledocholithiasis. - Failed cholecystostomy capping trial with development of recurrent right upper quadrant abdominal pain requiring conversion back to a gravity bag.   PLAN:   - Despite preserved patency of the cystic and common bile ducts, the patient has failed his capping/internalization trial with development of recurrent right upper quadrant abdominal pain following cholecystostomy tube capping trial.  - As such, patient will return to providing surgeon, Dr. Celine Ahr for evaluation, though assuming the patient is not an operative candidate, will return for routine fluoroscopic guided exchanges, with initial exchange to be performed the week of 09/27.  Ronny Bacon, MD Pager #: [phone]

## 2020-08-25 ENCOUNTER — Other Ambulatory Visit: Payer: Self-pay | Admitting: Interventional Radiology

## 2020-08-25 DIAGNOSIS — K810 Acute cholecystitis: Secondary | ICD-10-CM

## 2020-08-30 ENCOUNTER — Encounter: Payer: Self-pay | Admitting: General Surgery

## 2020-08-30 ENCOUNTER — Ambulatory Visit (INDEPENDENT_AMBULATORY_CARE_PROVIDER_SITE_OTHER): Payer: Medicare Other | Admitting: General Surgery

## 2020-08-30 ENCOUNTER — Other Ambulatory Visit: Payer: Self-pay

## 2020-08-30 VITALS — BP 146/97 | HR 103 | Temp 97.7°F | Ht 73.0 in | Wt 292.0 lb

## 2020-08-30 DIAGNOSIS — K8000 Calculus of gallbladder with acute cholecystitis without obstruction: Secondary | ICD-10-CM

## 2020-08-30 NOTE — Progress Notes (Signed)
Patient ID: Stanley Marks, male   DOB: 05-22-1941, 79 y.o.   MRN: [id number]  Chief Complaint  Patient presents with  . Hospitalization Follow-up    HPI Stanley Marks is a 79 y.o. male.   This visit is a follow-up to his hospitalization last month.  My consult note HPI is copied here:  "79 y.o. male who initially presented to [facility] ED on 08/08 with reports of 24 hours of upper abdominal pain associated with nausea, emesis, and diarrhea. Work up at that time was concerning for mild leukocytosis and RUQ US showed cholelithiasis without evidence of cholecystitis. After discussion with Dr Hampton Abbot, he was sent home on Augmentin and close follow up in our office. Unfortunately, he never made it to that appointment. He presented again to [facility] ED yesterday (08/11) afternoon with new complaints of weakness. This has been progressively worsening over the course of the last few days, and ultimately resulted in what he described as a mechanical fall. He reportedly "sat down on the floor in the bathroom" but did not hit his head. No LOC. He is on Xarelto secondary to history of Afib with RVR. He reports that his abdominal pain from his previous presentation has resolved along with the N/V/D. Only major associated symptoms is decreased PO intake. No fever, chills, cough, CP, SOB, bowel changes, or urinary changes. No juandice, orange colored urine, or acholic stools. Only previous abdominal surgery was an appendectomy. Work up in the ED yesterday revealed worsening leukocytosis to 20.7K (which is improved to 15K this morning), AKI with sCr - 1.31 (which is also improved to 1.27 this morning), and he was found to have new hyperbilirubinemia to 2.9 this morning on labs which was not seen on 08/07. RUQ [place] was again equivocal for cholecystitis."  Due to his extensive medical comorbidities, he was felt to represent a prohibitive surgical candidate.  A percutaneous cholecystostomy tube was placed.  He followed up with  interventional radiology on September 9 for a follow-up cholangiogram.  Per the radiologist who performed the study, the patient was tolerating the tube well, without complaints.  Cholangiogram showed patency of the cystic duct and the tube was capped for a trial of internalization.  Apparently, he tolerated this poorly and was returned to drainage.  Follow-up cholangiogram on 15 September demonstrated, once again, patency of the cystic duct and common bile ducts.  Due to his failure at internalization, he has been scheduled for follow-up in interventional radiology on October 1.  He was referred back to general surgery for further discussion.  Today, he states that he does have some pain at the drain site, but no other abdominal pain.  He is not experiencing any fevers or chills.  Bowel function is regular.  He expresses frustration with the fact that he has to continue having the tube.  He states that he does not go anywhere because he finds the bag embarrassing and he is unable to lie down flat at night secondary to the drain pulling on his skin.  He states that he wants his gallbladder out.   Past Medical History:  Diagnosis Date  . Atrial fibrillation ([place])    on Xarelto  . Basal cell carcinoma 08/24/2014   chin, L nose ant alar crease  . Cancer ([place])    skin  . CHF (congestive heart failure) ([place])   . Emphysema of lung ([place])   . Hypertension   . Obesity (BMI 30-39.9)   . Obstructive sleep apnea   . Pneumonia  due to COVID-19 virus    November 2021  . PVD (peripheral vascular disease) ([place])   . 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 APPENDECTOMY    . arm surgery    . ELECTROPHYSIOLOGIC STUDY N/A 11/20/2016   Procedure: CARDIOVERSION;  Surgeon: Dionisio David, MD;  Location: [facility] ORS;  Service: Cardiovascular;   Laterality: N/A;  . IR CHOLANGIOGRAM EXISTING TUBE  08/18/2020  . IR PERC CHOLECYSTOSTOMY  07/22/2020    History reviewed. No pertinent family history.  Social History Social History   Tobacco Use  . Smoking status: Current Every Day Smoker    Packs/day: 1.00    Types: Cigarettes    Start date: 12/10/1956  . Smokeless tobacco: Never Used  Substance Use Topics  . 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 Drug use: Never    Allergies  Allergen Reactions  . Lipitor [Atorvastatin Calcium] Other (See Comments)    arthralgia    Current Outpatient Medications  Medication Sig Dispense Refill  . ADVAIR HFA 230-21 MCG/ACT inhaler Inhale 1 puff into the lungs 2 (two) times daily as needed.     Marland Kitchen albuterol (VENTOLIN HFA) 108 (90 Base) MCG/ACT inhaler Inhale 2 puffs into the lungs every 6 (six) hours as needed for wheezing or shortness of breath. 8 g 1  . amiodarone (PACERONE) 200 MG tablet Take 2 tablets (400 mg total) by mouth 2 (two) times daily. 120 tablet 0  . digoxin (LANOXIN) 0.125 MG tablet Take 1 tablet (0.125 mg total) by mouth every other day. 15 tablet 0  . ipratropium-albuterol (DUONEB) 0.5-2.5 (3) MG/3ML SOLN SMARTSIG:3 Milliliter(s) Via Nebulizer Every 6 Hours    . lactulose (CHRONULAC) 10 GM/15ML solution Take 30 mLs (20 g total) by mouth daily as needed for mild constipation. 120 mL 0  . metoprolol succinate (TOPROL-XL) 50 MG 24 hr tablet Take 1 tablet (50 mg total) by mouth in the morning and at bedtime. Take with or immediately following a meal. 60 tablet 0  . ondansetron (ZOFRAN ODT) 4 MG disintegrating tablet Take 1 tablet (4 mg total) by mouth every 8 (eight) hours as needed for nausea or vomiting. 30 tablet 0  . Rivaroxaban (XARELTO) 15 MG TABS tablet Take 15 mg by mouth daily with supper.    . sacubitril-valsartan (ENTRESTO) 49-51 MG Take 1 tablet by mouth 2 (two) times daily.    Marland Kitchen torsemide (DEMADEX) 20 MG tablet Take 20 mg by mouth  daily.    . traMADol (ULTRAM) 50 MG tablet Take 1 tablet (50 mg total) by mouth every 6 (six) hours as needed. 30 tablet 0   No current facility-administered medications for this visit.    Review of Systems Review of Systems  Respiratory: Positive for cough and shortness of breath.   Gastrointestinal: Positive for abdominal pain.       At tube site  All other systems reviewed and are negative.   Blood pressure (!) 146/97, pulse (!) 103, temperature 97.7 F (36.5 C), temperature source Oral, height 6\' 1"  (1.854 m), weight 292 lb (132.5 kg), SpO2 92 %. Body mass index is 38.52 kg/m.  Physical Exam Physical Exam Constitutional:      General: He is not in acute distress.    Appearance:  He is obese.  HENT:     Head: Normocephalic and atraumatic.     Nose:     Comments: Covered with a mask    Mouth/Throat:     Comments: Covered with a mask Eyes:     General: No scleral icterus. Cardiovascular:     Rate and Rhythm: Tachycardia present. Rhythm irregular.  Pulmonary:     Effort: No respiratory distress.     Breath sounds: Rhonchi present.     Comments: Frequent wet cough. Abdominal:     Comments: Percutaneous cholecystostomy tube in right upper quadrant.  Entrance site is clean, without erythema, induration, or purulent drainage.  There is clear amber bile in the bag.  He is sore right around the drain site, but no other abdominal tenderness is appreciated.  Genitourinary:    Comments: Deferred. Musculoskeletal:     Right lower leg: Edema present.     Left lower leg: Edema present.  Skin:    Coloration: Skin is not jaundiced.     Comments: Multiple scaly lesions on backs of hands.  Neurological:     General: No focal deficit present.     Mental Status: He is alert.  Psychiatric:        Behavior: Behavior normal.     Data Reviewed I reviewed his hospital course from August, as well as the interventional radiology follow-up visits on both the ninth and 15  September.  He also had an emergency department visit on 20 August for constipation.  Assessment This is a 79 year old man with extensive medical comorbidities who presented to the hospital in August with acute cholecystitis.  This was managed with a percutaneous cholecystostomy tube.  Based upon drain studies, his biliary tree is patent, but he did not tolerate a capping trial.  He remains a very high risk surgical candidate.  Plan I had an extensive discussion with Mr. Echeverria today regarding the significant risk that operative intervention likely would pose for him.  This could include complications to the point of causing his demise.  He stated that he would just as soon "meet up with his wife" who died last year.  I explained to him that the majority of patients are able to ultimately have the cholecystostomy tube removed.  I also explained that I am not willing to be the instrument of his reunion with his wife.  I offered him consultation with another surgeon, however he declined.  I encouraged him to keep his appointment with interventional radiology on October 1.  I let him know that I was hopeful that he would ultimately be able to tolerate capping and ultimately tube removal.  I will see him on an as-needed basis.  Greater than 50% of this 45-minute patient encounter was dedicated to counseling and coordination of care.    Fredirick Maudlin 08/30/2020, 11:02 AM

## 2020-08-30 NOTE — Patient Instructions (Addendum)
Interventional radiology will see you again on 09/09/20. They may try capping the tube again.   Call us if you start to have any abdominal pain or nausea with vomiting.

## 2020-09-09 ENCOUNTER — Ambulatory Visit
Admission: RE | Admit: 2020-09-09 | Discharge: 2020-09-09 | Disposition: A | Discharge status: Home / Self Care | Payer: Medicare Other | Source: Ambulatory Visit | Attending: Interventional Radiology | Admitting: Interventional Radiology

## 2020-09-09 ENCOUNTER — Other Ambulatory Visit: Payer: Self-pay

## 2020-09-09 ENCOUNTER — Ambulatory Visit: Payer: Medicare Other

## 2020-09-09 ENCOUNTER — Ambulatory Visit: Admission: RE | Admit: 2020-09-09 | Payer: Medicare Other | Source: Ambulatory Visit

## 2020-09-09 DIAGNOSIS — Z539 Procedure and treatment not carried out, unspecified reason: Secondary | ICD-10-CM | POA: Insufficient documentation

## 2020-09-09 DIAGNOSIS — K810 Acute cholecystitis: Secondary | ICD-10-CM

## 2020-09-09 NOTE — Progress Notes (Signed)
Pt procedure pushed 1 hour due to emergent case. Pt wants to reschedule for next week. IR RN Jocelyn Lamer to reschedule.

## 2020-09-16 ENCOUNTER — Emergency Department
Admission: EM | Admit: 2020-09-16 | Discharge: 2020-09-16 | Disposition: A | Discharge status: Home / Self Care | Payer: Medicare Other | Attending: Emergency Medicine | Admitting: Emergency Medicine

## 2020-09-16 ENCOUNTER — Other Ambulatory Visit: Payer: Self-pay

## 2020-09-16 ENCOUNTER — Ambulatory Visit
Admission: RE | Admit: 2020-09-16 | Discharge: 2020-09-16 | Disposition: A | Discharge status: Home / Self Care | Payer: Medicare Other | Source: Ambulatory Visit | Attending: Interventional Radiology | Admitting: Interventional Radiology

## 2020-09-16 ENCOUNTER — Encounter: Payer: Self-pay | Admitting: Emergency Medicine

## 2020-09-16 DIAGNOSIS — F329 Major depressive disorder, single episode, unspecified: Secondary | ICD-10-CM | POA: Insufficient documentation

## 2020-09-16 DIAGNOSIS — J439 Emphysema, unspecified: Secondary | ICD-10-CM | POA: Insufficient documentation

## 2020-09-16 DIAGNOSIS — Z79899 Other long term (current) drug therapy: Secondary | ICD-10-CM | POA: Insufficient documentation

## 2020-09-16 DIAGNOSIS — Z538 Procedure and treatment not carried out for other reasons: Secondary | ICD-10-CM | POA: Insufficient documentation

## 2020-09-16 DIAGNOSIS — Z85828 Personal history of other malignant neoplasm of skin: Secondary | ICD-10-CM | POA: Insufficient documentation

## 2020-09-16 DIAGNOSIS — Z888 Allergy status to other drugs, medicaments and biological substances status: Secondary | ICD-10-CM | POA: Insufficient documentation

## 2020-09-16 DIAGNOSIS — J441 Chronic obstructive pulmonary disease with (acute) exacerbation: Secondary | ICD-10-CM | POA: Insufficient documentation

## 2020-09-16 DIAGNOSIS — F17210 Nicotine dependence, cigarettes, uncomplicated: Secondary | ICD-10-CM | POA: Insufficient documentation

## 2020-09-16 DIAGNOSIS — R45851 Suicidal ideations: Secondary | ICD-10-CM | POA: Insufficient documentation

## 2020-09-16 DIAGNOSIS — I739 Peripheral vascular disease, unspecified: Secondary | ICD-10-CM | POA: Insufficient documentation

## 2020-09-16 DIAGNOSIS — I5022 Chronic systolic (congestive) heart failure: Secondary | ICD-10-CM | POA: Diagnosis not present

## 2020-09-16 DIAGNOSIS — K810 Acute cholecystitis: Secondary | ICD-10-CM | POA: Insufficient documentation

## 2020-09-16 DIAGNOSIS — Z7901 Long term (current) use of anticoagulants: Secondary | ICD-10-CM | POA: Insufficient documentation

## 2020-09-16 DIAGNOSIS — I4891 Unspecified atrial fibrillation: Secondary | ICD-10-CM | POA: Insufficient documentation

## 2020-09-16 DIAGNOSIS — I110 Hypertensive heart disease with heart failure: Secondary | ICD-10-CM | POA: Insufficient documentation

## 2020-09-16 DIAGNOSIS — F32A Depression, unspecified: Secondary | ICD-10-CM

## 2020-09-16 LAB — COMPREHENSIVE METABOLIC PANEL
ALT: 9 U/L (ref 0–44)
AST: 13 U/L — ABNORMAL LOW (ref 15–41)
Albumin: 3.6 g/dL (ref 3.5–5.0)
Alkaline Phosphatase: 69 U/L (ref 38–126)
Anion gap: 9 (ref 5–15)
BUN: 13 mg/dL (ref 8–23)
CO2: 25 mmol/L (ref 22–32)
Calcium: 9.1 mg/dL (ref 8.9–10.3)
Chloride: 106 mmol/L (ref 98–111)
Creatinine, Ser: 1.12 mg/dL (ref 0.61–1.24)
GFR calc non Af Amer: >60 mL/min (ref >60)
Glucose, Bld: 107 mg/dL — ABNORMAL HIGH (ref 70–99)
Potassium: 4.2 mmol/L (ref 3.5–5.1)
Sodium: 140 mmol/L (ref 135–145)
Total Bilirubin: 0.7 mg/dL (ref 0.3–1.2)
Total Protein: 7.3 g/dL (ref 6.5–8.1)

## 2020-09-16 LAB — CBC
HCT: 49 % (ref 39.0–52.0)
Hemoglobin: 15.8 g/dL (ref 13.0–17.0)
MCH: 30.1 pg (ref 26.0–34.0)
MCHC: 32.2 g/dL (ref 30.0–36.0)
MCV: 93.3 fL (ref 80.0–100.0)
Platelets: 213 10*3/uL (ref 150–400)
RBC: 5.25 MIL/uL (ref 4.22–5.81)
RDW: 15.9 % — ABNORMAL HIGH (ref 11.5–15.5)
WBC: 9.3 10*3/uL (ref 4.0–10.5)
nRBC: 0 % (ref 0.0–0.2)

## 2020-09-16 LAB — ACETAMINOPHEN LEVEL: Acetaminophen (Tylenol), Serum: <10 ug/mL — ABNORMAL LOW (ref 10–30)

## 2020-09-16 LAB — ETHANOL: Alcohol, Ethyl (B): <10 mg/dL (ref <10)

## 2020-09-16 LAB — SALICYLATE LEVEL: Salicylate Lvl: <7 mg/dL — ABNORMAL LOW (ref 7.0–30.0)

## 2020-09-16 NOTE — BH Assessment (Signed)
Assessment Note  Stanley Marks is an 79 y.o. male who presents to the ER, because his medical provider had concerns about him harming his self. Per the notes in patients chart, he asked, "how many Tylenol can I take to make the pain go away and that I won't wake up in the morning?" Then stated, "I am on medicine to lower my heart rate, "how many of those do I have to take so I don't wake up? Patient acknowledged he made the statements and explained, he was asking for clarification because I dont want to overtake them. Without Probation officer asking, patient shared protective factors about why he wouldnt harm or attempted to end his life. He states he love his self too much; as well as loves his family too much and want to be here with and for them. He also shared he survived the war and his belief in God would not allow him to kill his self.  Per the report of the patient daughter-n-law (Stanley Marks-[phone]), she has no concerns of the patient doing anything to harm his self. She shared, he is frustrated about his current medical situation and hes having pain. However, him doing anything to harm his self, he will not do. She further reports, he lives with her, her husband, and three other children and its rare for him to be alone because how often the family interact with each other. When asked about any other stressors, changes in his mood or behaviors? The daughter said, there wasnt any. However, his decease wife birthday is coming up. I know he misses her, but he wouldnt kill self, because he knows he wouldnt be with her if he did. Due to their religious/spiritual beliefs, suicide will result in a person going to hell.  Daughter-n-law shared, if he was discharged, they will make sure someone is always with him, as well as having no access to any medications.  During the interview, the patient was cooperative, and pleasant but frustrated about having to come to the ER. He was able to  provide appropriate answers to the questions. Throughout the interview he denied SI/HI and AV/H. He was primarily focused leaving the hospital because he was frustrated about not having his procedure and he was brought to the ER. He was also mad; his granddaughter was waiting alone in the medical mall because she came with him for the procedure. My grandbaby out there by herself, because yall got me in her for some dumb miss-understanding. I know you have to do your job, but this is ridiculous, Im ready to go.  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  Social History:  reports that he has been smoking cigarettes. He started smoking about 63 years ago. He has been smoking about 1.00 pack per day. He has never used smokeless tobacco. He reports previous alcohol use. He reports that he does not use drugs.  Additional Social History:  Alcohol / Drug Use Pain Medications: See PTA Prescriptions: See PTA Over the Counter: See PTA History of alcohol / drug use?: No  history of alcohol / drug abuse Longest period of sobriety (when/how long): n/a  CIWA: CIWA-Ar BP: 102/69 Pulse Rate: 85 COWS:    Allergies:  Allergies  Allergen Reactions   Lipitor [Atorvastatin Calcium] Other (See Comments)    arthralgia    Home Medications: (Not in a hospital admission)   OB/GYN Status:  No LMP for male patient.  General Assessment Data Location of Assessment: [facility] ED TTS Assessment: In system Is this a Tele or Face-to-Face Assessment?: Face-to-Face Is this an Initial Assessment or a Re-assessment for this encounter?: Initial Assessment Patient Accompanied by:: N/A Language Other than English: No Living Arrangements: Other (Comment) (Private Home) What gender do you identify as?: Male Date Telepsych consult ordered in [facility]: 09/16/20 Time Telepsych consult ordered in [facility]: 1002 Marital status: Widowed Pregnancy Status: No Living Arrangements: Children Can pt return to current living arrangement?: Yes Admission Status: Voluntary Is patient capable of signing voluntary admission?: Yes Referral Source: Medical Floor Inpatient Insurance type: Medicare A&B  Medical Screening Exam ([place]) Medical Exam completed: Yes  Crisis Care Plan Living Arrangements: Children Legal Guardian: Other: (Self) Name of Psychiatrist: Reports of none Name of Therapist: Reports of none  Education Status Is patient currently in school?: No Is the patient employed, unemployed or receiving disability?: Unemployed  Risk to self with the past 6 months Suicidal Ideation: No Has patient been a risk to self within the past 6 months prior to admission? : No Suicidal Intent: No Has patient had any suicidal intent within the past 6 months prior to admission? : No Is patient at risk for suicide?: No Suicidal Plan?: No Has patient had any suicidal plan within the past 6 months prior to admission? : No Access to Means: No What has been your use of drugs/alcohol within  the last 12 months?: Reports of none Previous Attempts/Gestures: No How many times?: 0 Other Self Harm Risks: Reports of none Triggers for Past Attempts: None known Intentional Self Injurious Behavior: None Family Suicide History: No Recent stressful life event(s): Recent negative physical changes Persecutory voices/beliefs?: No Depression: No Depression Symptoms:  (Reports of none) Substance abuse history and/or treatment for substance abuse?: No Suicide prevention information given to non-admitted patients: Not applicable  Risk to Others within the past 6 months Homicidal Ideation: No Does patient have any lifetime risk of violence toward others beyond the six months prior to admission? : No Thoughts of Harm to Others: No Current Homicidal Intent: No Current Homicidal Plan: No Access to Homicidal Means: No Identified Victim: Reports of none History of harm to others?: No Assessment of Violence: None Noted Violent Behavior Description: Reports of none Does patient have access to weapons?: No Criminal Charges Pending?: No Does patient have a court date: No Is patient on probation?: No  Psychosis Hallucinations: None noted Delusions: None noted  Mental  Status Report Appearance/Hygiene: Unremarkable, In scrubs Eye Contact: Good Motor Activity: Freedom of movement, Unremarkable Speech: Logical/coherent, Unremarkable Level of Consciousness: Alert Mood: Pleasant Affect: Appropriate to circumstance Anxiety Level: None Thought Processes: Coherent, Relevant Judgement: Unimpaired Orientation: Person, Place, Time, Situation, Appropriate for developmental age Obsessive Compulsive Thoughts/Behaviors: None  Cognitive Functioning Concentration: Normal Memory: Recent Intact, Remote Intact Is patient IDD: No Insight: Good Impulse Control: Good Appetite: Good Have you had any weight changes? : No Change Sleep: No Change Total Hours of Sleep: 7 Vegetative Symptoms:  None  ADLScreening [facility] Assessment Services) Patient's cognitive ability adequate to safely complete daily activities?: Yes Patient able to express need for assistance with ADLs?: Yes Independently performs ADLs?: Yes (appropriate for developmental age)  Prior Inpatient Therapy Prior Inpatient Therapy: No  Prior Outpatient Therapy Prior Outpatient Therapy: No Does patient have an ACCT team?: No Does patient have Intensive In-House Services?  : No Does patient have [facility] services? : No Does patient have P4CC services?: No  ADL Screening (condition at time of admission) Patient's cognitive ability adequate to safely complete daily activities?: Yes Is the patient deaf or have difficulty hearing?: No Does the patient have difficulty seeing, even when wearing glasses/contacts?: No Does the patient have difficulty concentrating, remembering, or making decisions?: No Patient able to express need for assistance with ADLs?: Yes Does the patient have difficulty dressing or bathing?: No Independently performs ADLs?: Yes (appropriate for developmental age) Does the patient have difficulty walking or climbing stairs?: No Weakness of Legs: None Weakness of Arms/Hands: None  Therapy Consults (therapy consults require a physician order) PT Evaluation Needed: No OT Evalulation Needed: No SLP Evaluation Needed: No Abuse/Neglect Assessment (Assessment to be complete while patient is alone) Abuse/Neglect Assessment Can Be Completed: Yes Physical Abuse: Denies Verbal Abuse: Denies Sexual Abuse: Denies Exploitation of patient/patient's resources: Denies Self-Neglect: Denies Values / Beliefs Cultural Requests During Hospitalization: None Spiritual Requests During Hospitalization: None Consults Spiritual Care Consult Needed: No Transition of Care Team Consult Needed: No Advance Directives (For Healthcare) Does Patient Have a Medical Advance Directive?: Yes  Disposition:   Disposition Initial Assessment Completed for this Encounter: Yes  On Site Evaluation by:   Reviewed with Physician:    Gunnar Fusi MS, LCAS, [facility], [place] Therapeutic Triage Specialist 09/16/2020 12:35 PM

## 2020-09-16 NOTE — ED Provider Notes (Signed)
[facility] Emergency Department Provider Note  ____________________________________________   First MD Initiated Contact with Patient 09/16/20 1116     (approximate)  I have reviewed the triage vital signs and the nursing notes.   HISTORY  Chief Complaint Psychiatric Evaluation    HPI Stanley Marks is a 79 y.o. male with severe chronic medical issues who presents for mental health evaluation after some comments he made in a preprocedural setting.  Reportedly he was asking questions to the nurses about his medications that seem to imply that he was considering taking an overdose to see if they would harm him or if they would be enough to kill him. He is struggling with chronic medical issues and is clearly frustrated by them. However at this time he seems to be much more frustrated at being in the emergency department. He says there is absolutely nothing wrong and that he was just asking about the medicine so he would know how much he could take safely. His granddaughter is with him and he is very concerned about her and needs to leave so that he can get her back home and onto her next activity.  He denies fever, chest pain, shortness of breath. He has chronic issues with his gallbladder including a percutaneous drain. [facility] he [facility] getting his care here but he says he has no acute medical complaints at this time. He cannot quantify his mental health issues in terms of severity because he says he has no concerns or issues at this time and adamantly denies suicidal ideation and homicidal ideation even though he is obviously very frustrated at being kept here under circumstances that he feels are unnecessary.        Past Medical History:  Diagnosis Date  . Atrial fibrillation ([place])    on Xarelto  . Basal cell carcinoma 08/24/2014   chin, L nose ant alar crease  . Cancer ([place])    skin  . CHF (congestive heart  failure) ([place])   . Emphysema of lung ([place])   . Hypertension   . Obesity (BMI 30-39.9)   . Obstructive sleep apnea   . Pneumonia due to COVID-19 virus    November 2021  . PVD (peripheral vascular disease) ([place])   . 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 Squamous cell carcinoma of skin 01/17/2015   R prox lateral dorsum of hand    Patient Active Problem List   Diagnosis Date Noted  . Acute calculous cholecystitis 07/20/2020  . Obesity (BMI 35.0-39.9 without comorbidity)   . PVD (peripheral vascular disease) ([place])   . Wound of left leg   . Acute respiratory failure due to COVID-19 ([place]) 12/22/2019  . 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 Atrial fibrillation, chronic ([place]) 12/22/2019  . Acute respiratory failure with hypoxia ([place]) 12/22/2019  . Pneumonia due to COVID-19 virus 12/22/2019  . Atrial fibrillation with RVR ([place])   . Essential hypertension   . Influenza A 01/28/2017  . Pneumonia 01/28/2017  . COPD exacerbation ([place]) 01/28/2017  . Tobacco abuse counseling 01/28/2017  . Patient's noncompliance with other medical treatment and regimen 01/28/2017  . Emphysema of lung ([place]) 01/28/2017  . Leukocytosis 01/28/2017  . Chronic systolic CHF (congestive heart failure) ([place]) 01/28/2017  . Bradycardia 01/28/2017  . Acute and chronic respiratory failure with hypoxia ([place]) 01/26/2017  . Obstructive sleep apnea syndrome 03/01/2016  .  Personal history of other malignant neoplasm of skin 12/08/2014    Past Surgical History:  Procedure Laterality Date  . APPENDECTOMY    . arm surgery    . ELECTROPHYSIOLOGIC STUDY N/A 11/20/2016   Procedure: CARDIOVERSION;  Surgeon: Dionisio David, MD;  Location: [facility] ORS;  Service: Cardiovascular;  Laterality: N/A;  . IR CHOLANGIOGRAM EXISTING TUBE  08/18/2020  . IR PERC CHOLECYSTOSTOMY  07/22/2020    Prior to Admission medications   Medication Sig Start Date End Date Taking? Authorizing Provider    ADVAIR HFA 230-21 MCG/ACT inhaler Inhale 1 puff into the lungs 2 (two) times daily as needed.  12/17/19   [provider]  albuterol (VENTOLIN HFA) 108 (90 Base) MCG/ACT inhaler Inhale 2 puffs into the lungs every 6 (six) hours as needed for wheezing or shortness of breath. 12/21/19   Vanessa Woodbine, MD  amiodarone (PACERONE) 200 MG tablet Take 2 tablets (400 mg total) by mouth 2 (two) times daily. 01/28/20   Loletha Grayer, MD  digoxin (LANOXIN) 0.125 MG tablet Take 1 tablet (0.125 mg total) by mouth every other day. 01/28/20   Loletha Grayer, MD  ipratropium-albuterol (DUONEB) 0.5-2.5 (3) MG/3ML SOLN SMARTSIG:3 Milliliter(s) Via Nebulizer Every 6 Hours 08/19/20   [provider]  lactulose (CHRONULAC) 10 GM/15ML solution Take 30 mLs (20 g total) by mouth daily as needed for mild constipation. Patient not taking: Reported on 09/16/2020 07/29/20   Paulette Blanch, MD  metoprolol succinate (TOPROL-XL) 50 MG 24 hr tablet Take 1 tablet (50 mg total) by mouth in the morning and at bedtime. Take with or immediately following a meal. 01/28/20   Loletha Grayer, MD  ondansetron (ZOFRAN ODT) 4 MG disintegrating tablet Take 1 tablet (4 mg total) by mouth every 8 (eight) hours as needed for nausea or vomiting. 07/17/20   Paulette Blanch, MD  Rivaroxaban (XARELTO) 15 MG TABS tablet Take 15 mg by mouth daily with supper.    [provider]  sacubitril-valsartan (ENTRESTO) 49-51 MG Take 1 tablet by mouth 2 (two) times daily.    [provider]  torsemide (DEMADEX) 20 MG tablet Take 20 mg by mouth daily.    [provider]  traMADol (ULTRAM) 50 MG tablet Take 1 tablet (50 mg total) by mouth every 6 (six) hours as needed. 07/17/20   Paulette Blanch, MD    Allergies Lipitor [atorvastatin calcium]  No family history on file.  Social History Social History   Tobacco Use  . Smoking status: Current Every Day Smoker    Packs/day: 1.00    Types: Cigarettes    Start date: 12/10/1954   . Smokeless tobacco: Never Used  Vaping Use  . Vaping Use: Never used  Substance Use Topics  . 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 Drug use: Never    Review of Systems Constitutional: No fever/chills Eyes: No visual changes. ENT: No sore throat. Cardiovascular: Denies chest pain. Respiratory: Denies shortness of breath. Gastrointestinal: No abdominal pain.  No nausea, no vomiting.  No diarrhea.  No constipation. Genitourinary: Negative for dysuria. Musculoskeletal: Negative for neck pain.  Negative for back pain. Integumentary: Negative for rash. Neurological: Negative for headaches, focal weakness or numbness. Psychiatric:  Denies SI and HI and spite of comments made to procedural nurses that were concerning.  ____________________________________________   PHYSICAL EXAM:  VITAL SIGNS: ED Triage Vitals  Enc Vitals Group     BP 09/16/20  5284 102/69     Pulse Rate 09/16/20 0952 85     Resp 09/16/20 0952 18     Temp 09/16/20 0952 97.8 F (36.6 C)     Temp Source 09/16/20 0952 Oral     SpO2 09/16/20 0952 94 %     Weight 09/16/20 0959 131.5 kg (289 lb 14.5 oz)     Height --      Head Circumference --      Peak Flow --      Pain Score 09/16/20 0958 0     Pain Loc --      Pain Edu? --      Excl. in [place]? --     Constitutional: Alert and oriented. [place] of chronic illness but not in acute distress. Eyes: Conjunctivae are normal.  Head: Atraumatic. Nose: No congestion/rhinnorhea. Mouth/Throat: Patient is wearing a mask. Neck: No stridor.  No meningeal signs.   Cardiovascular: Normal rate, regular rhythm. Good peripheral circulation. Grossly normal heart sounds. Respiratory: Normal respiratory effort.  No retractions. Gastrointestinal: Soft and nontender. No distention. Percutaneous cholecystostomy is in place and draining biliary material. Musculoskeletal: No lower extremity tenderness nor edema. No gross deformities of  extremities. Neurologic:  Normal speech and language. No gross focal neurologic deficits are appreciated.  Skin:  Skin is warm, dry and intact. Psychiatric: Mood and affect are aggravated on the circumstances but without any warning signs or symptoms. He is forward thinking and goal oriented in terms of taking care of his family. He adamantly denies any thoughts of killing himself or harming anyone else.  ____________________________________________   LABS (all labs ordered are listed, but only abnormal results are displayed)  Labs Reviewed  COMPREHENSIVE METABOLIC PANEL - Abnormal; Notable for the following components:      Result Value   Glucose, Bld 107 (*)    AST 13 (*)    All other components within normal limits  SALICYLATE LEVEL - Abnormal; Notable for the following components:   Salicylate Lvl <1.3 (*)    All other components within normal limits  ACETAMINOPHEN LEVEL - Abnormal; Notable for the following components:   Acetaminophen (Tylenol), Serum <10 (*)    All other components within normal limits  CBC - Abnormal; Notable for the following components:   RDW 15.9 (*)    All other components within normal limits  ETHANOL  URINE DRUG SCREEN, QUALITATIVE ([facility] ONLY)   ____________________________________________  EKG  No indication for emergent EKG ____________________________________________  RADIOLOGY Ursula Alert, personally viewed and evaluated these images (plain radiographs) as part of my medical decision making, as well as reviewing the written report by the radiologist.  ED MD interpretation: No indication for emergent imaging  Official radiology report(s): No results found.  ____________________________________________   PROCEDURES   Procedure(s) performed (including Critical Care):  Procedures   ____________________________________________   INITIAL IMPRESSION / MDM / [place] / ED COURSE  As part of my medical decision making,  I reviewed the following data within the [place] notes reviewed and incorporated, Labs reviewed , Old chart reviewed, A consult was requested and obtained from this/these consultant(s) (TTS) and Notes from prior ED visits   Differential diagnosis includes, but is not limited to, depression, suicidal ideation, mood disorder, adjustment disorder.  The patient's vital signs are stable and his labs are stable. He has no evidence of any acute or emergent medical condition.  I understand the concerns and the procedural setting about the, as he  was making but the patient is exhibiting no signs or symptoms of suicidality. He says this was a misunderstanding and Calvin with TTS spoke directly with the patient's granddaughter who is still on the premises. She has no concerns for his safety and said that family is with him and watching after him. She also believes that this is a misunderstanding.  While he does have some risk factors (we are approaching the second year anniversary of his wife's death and he is struggling with chronic medical conditions and the associated frustrations), it is my assessment that he does not meet criteria for involuntary commitment nor inpatient mental health treatment. He very much does not want to be here and wants to leave to go on with his life and the life of his granddaughter. I think we would be doing him and his family a disservice put him under involuntary commitment and keep him against as well. He knows that he can follow-up as an outpatient or return at any time if he feels that he is in danger or if his symptoms worsen. He was discharged for outpatient follow-up and with outpatient resources.           ____________________________________________  FINAL CLINICAL IMPRESSION(S) / ED DIAGNOSES  Final diagnoses:  Depression, unspecified depression type     MEDICATIONS GIVEN DURING THIS VISIT:  Medications - No data to  display   ED Discharge Orders    None      *Please note:  Zayde Stamant was evaluated in Emergency Department on 09/16/2020 for the symptoms described in the history of present illness. He was evaluated in the context of the global COVID-19 pandemic, which necessitated consideration that the patient might be at risk for infection with the SARS-CoV-2 virus that causes COVID-19. Institutional protocols and algorithms that pertain to the evaluation of patients at risk for COVID-19 are in a state of rapid change based on information released by regulatory bodies including the CDC and federal and state organizations. These policies and algorithms were followed during the patient's care in the ED.  Some ED evaluations and interventions may be delayed as a result of limited staffing during and after the pandemic.*  Note:  This document was prepared using Dragon voice recognition software and may include unintentional dictation errors.   Hinda Kehr, MD 09/16/20 1229

## 2020-09-16 NOTE — ED Triage Notes (Signed)
Was in procedural area to get urine tube changed and they brought him here due to suicidal thoughts.  Patient says he does not want to kill self.  Says he did ask about a med he takes and if it would kill him to take too much.

## 2020-09-16 NOTE — ED Notes (Signed)
Pt discharged home. VS stable. Discharge instructions reviewed with patient. All belongings returned to patient. Pt denies SI.

## 2020-09-16 NOTE — Progress Notes (Signed)
Patient arrived for procedure, during intake interview and obtaining consent, patient questioned NP: Donavan Burnet "how many tylenol can I take to make the pain go away and that I won't wake up in the morning?" Further into intake interview patient states "I am on medicine to lower my heart rate, "how many of those do I have to take so I don't wake up? Mr. Binsfeld states since his wife passed away year ago, nothing the same.  "she  didn't tell me anything." "I am a [place] veteran and the [place] doesn't care about me." Patient frustrated with chole tube, "I can't do or go anywhere."

## 2020-09-16 NOTE — Progress Notes (Signed)
Dr. Pascal Lux at bedside speaking to patient regarding chole tube and psychological state. Procedure to be cancelled for today. Patient in agreement to be evaluated in emergency department. Dr. Pascal Lux spoke to granddaughter. Patient escorted to emergency department via wheelchair with two RN's voluntarily.

## 2020-09-16 NOTE — Progress Notes (Signed)
Referring Physician(s): Z. Standley Brooking PA  Supervising Physician: Sandi Mariscal  Patient Status:  [facility] - out-pt  Chief Complaint:  Abdominal pain found to have acute cholecystitis s/p cholecystomy tube placed on 8.13.21 by Dr. Vernard Gambles  Subjective:  Patient exhibited frustration with his health status. Patient states " I would rather die then have this tube in my any more" Patient states that he is not a surgical candidate and "would be fine to die during surgery" Patient asking obscure questions like :what would happened to me if I take 200-300 mg of  My heart medicine that slows down my heart rate"  When asked directly if patient has plans to harm himself patient denies.   Patent lives with daughter in law, son and grandchildren. He is accompanied to this visit with a 86 y.o. grandchild. He states that he "don't like them"  Case discussed with ED Attending Dr. Corky Downs. Attempt will be made for patient to be seen in the ED for psych evaluation.  IR Attending Dr. Pascal Lux spoke to patient directly and patient states that he is agreeable to be seen the ED for further evaluation. patient taken directly to the ED by IR RN.    Procedure will be deferred at this time.      Allergies: Lipitor [atorvastatin calcium]  Medications: Prior to Admission medications   Medication Sig Start Date End Date Taking? Authorizing Provider  ADVAIR HFA 230-21 MCG/ACT inhaler Inhale 1 puff into the lungs 2 (two) times daily as needed.  12/17/19   [provider]  albuterol (VENTOLIN HFA) 108 (90 Base) MCG/ACT inhaler Inhale 2 puffs into the lungs every 6 (six) hours as needed for wheezing or shortness of breath. 12/21/19   Vanessa [place], MD  amiodarone (PACERONE) 200 MG tablet Take 2 tablets (400 mg total) by mouth 2 (two) times daily. 01/28/20   Loletha Grayer, MD  digoxin (LANOXIN) 0.125 MG tablet Take 1 tablet (0.125 mg total) by mouth every other day. 01/28/20   Loletha Grayer, MD    ipratropium-albuterol (DUONEB) 0.5-2.5 (3) MG/3ML SOLN SMARTSIG:3 Milliliter(s) Via Nebulizer Every 6 Hours 08/19/20   [provider]  lactulose (CHRONULAC) 10 GM/15ML solution Take 30 mLs (20 g total) by mouth daily as needed for mild constipation. 07/29/20   Paulette Blanch, MD  metoprolol succinate (TOPROL-XL) 50 MG 24 hr tablet Take 1 tablet (50 mg total) by mouth in the morning and at bedtime. Take with or immediately following a meal. 01/28/20   Loletha Grayer, MD  ondansetron (ZOFRAN ODT) 4 MG disintegrating tablet Take 1 tablet (4 mg total) by mouth every 8 (eight) hours as needed for nausea or vomiting. 07/17/20   Paulette Blanch, MD  Rivaroxaban (XARELTO) 15 MG TABS tablet Take 15 mg by mouth daily with supper.    [provider]  sacubitril-valsartan (ENTRESTO) 49-51 MG Take 1 tablet by mouth 2 (two) times daily.    [provider]  torsemide (DEMADEX) 20 MG tablet Take 20 mg by mouth daily.    [provider]  traMADol (ULTRAM) 50 MG tablet Take 1 tablet (50 mg total) by mouth every 6 (six) hours as needed. 07/17/20   Paulette Blanch, MD     Vital Signs: There were no vitals taken for this visit.  Physical Exam Vitals and nursing note reviewed.  Constitutional:      Appearance: He is well-developed.  HENT:     Head: Normocephalic.  Pulmonary:     Effort: Pulmonary  effort is normal.  Abdominal:     Comments: Positive RUQ drain  to gravity bag. 5 ml of  brown colored fluid noted in bulb is gravity   Musculoskeletal:        General: Normal range of motion.     Cervical back: Normal range of motion.  Skin:    General: Skin is dry.  Neurological:     Mental Status: He is alert and oriented to person, place, and time.     Imaging: No results found.  Labs:  CBC: Recent Labs    07/21/20 0427 07/22/20 0548 07/23/20 0718 07/28/20 1659  WBC 15.0* 13.3* 10.3 14.3*  HGB 13.4 13.1 14.1 16.1  HCT 41.2 41.2 43.9 48.4  PLT 190 221 232 321     COAGS: Recent Labs    07/22/20 0548  INR 1.2    BMP: Recent Labs    07/22/20 0548 07/23/20 0718 07/24/20 0535 07/28/20 1659  NA 139 141 143 136  K 4.1 4.3 4.0 4.3  CL 105 106 106 101  CO2 24 28 26 25   GLUCOSE 113* 116* 108* 142*  BUN 18 17 17 15   CALCIUM 8.6* 8.7* 8.8* 8.6*  CREATININE 0.92 0.96 0.92 1.10  GFRNONAA >60 >60 >60 >60  GFRAA >60 >60 >60 >60    LIVER FUNCTION TESTS: Recent Labs    07/22/20 0548 07/23/20 0718 07/24/20 0535 07/28/20 1659  BILITOT 2.8* 2.0* 1.2 1.2  AST 28 17 12* 20  ALT 36 27 22 21   ALKPHOS 119 120 106 122  PROT 6.3* 6.2* 6.4* 7.0  ALBUMIN 2.5* 2.3* 2.4* 2.9*    Assessment and Plan:  79 y.o. male outpatient. History of basal cell carcinoma, emphysema, PVD, a fib (on xarelto).  Found to have acute cholecystitis while being worked up for abdominal weakness and previously seen for  Abdominal pain , nausea, vomiting and diarrhea. IR placed a Cholecystomy tube placement on 8.13.21 by Dr. Lucrezia Europe. Cholangiogram performed on 9.9.21 showed a patent cystic and common bile duct. Patient's cholecystomy tube was capped at that time. Patient returned on 9.15.21 for possible removal however patient reported RUQ pain with cholangiogram several days prior to the exam and placed the cholecystomy tube to gravity bag at that time. Patient was found to have cholelithiasis during the cholangiogram and subsequently failed the capping trial. Per Dr. Celine Ahr patient is a poor surgical candidate. Patient returns for cholecystomy tube exchange.  No recent imaging since last cholangiogram performed on 9.15.21. No recent labs, patient is on xarelto for a fib. No pertinent allergies.   Risks and benefits discussed with the patient including bleeding, infection, damage to adjacent structures, bowel perforation/fistula connection, and sepsis.  All of the patient's questions were answered, patient is agreeable to proceed. Consent signed and in chart.      Electronically Signed: Jacqualine Mau, NP 09/16/2020, 8:18 AM   I spent a total of 15 Minutes at the patient's bedside AND on the patient's hospital floor or unit, greater than 50% of which was counseling/coordinating care for cholecystomy tube exchange

## 2020-09-16 NOTE — Discharge Instructions (Addendum)
You were evaluated today in the emergency department because of some concerns about your depression and frustration over your chronic health issues. You are very clear that you do not want to harm yourself or anyone else and your granddaughter agrees with you and has no concerns about your safety at this time. I recommend you follow-up with your regular doctor, any of the resources available at the [place], or at [facility] services. They can help you with any ongoing mental health issues with everything you have going on and that you are dealing with at this time. If you have ANY thoughts about harming yourself, either with an overdose or any other method, please call 911 or return immediately to the nearest emergency department.

## 2020-09-22 ENCOUNTER — Other Ambulatory Visit: Payer: Self-pay | Admitting: Interventional Radiology

## 2020-09-22 ENCOUNTER — Other Ambulatory Visit: Payer: Self-pay

## 2020-09-22 ENCOUNTER — Ambulatory Visit
Admission: RE | Admit: 2020-09-22 | Discharge: 2020-09-22 | Disposition: A | Discharge status: Home / Self Care | Payer: Medicare Other | Source: Ambulatory Visit | Attending: Interventional Radiology | Admitting: Interventional Radiology

## 2020-09-22 DIAGNOSIS — K810 Acute cholecystitis: Secondary | ICD-10-CM

## 2020-09-22 MED ORDER — IOHEXOL 300 MG/ML  SOLN
25.0000 mL | Freq: Once | INTRAMUSCULAR | Status: AC | PRN
  Administered 2020-09-22: 25 mL

## 2020-09-22 MED ORDER — LIDOCAINE HCL (PF) 1 % IJ SOLN
10.0000 mL | Freq: Once | INTRAMUSCULAR | Status: AC
  Administered 2020-09-22: 10 mL
  Filled 2020-09-22: qty 10

## 2020-09-22 NOTE — Procedures (Signed)
Interventional Radiology Procedure Note  Procedure: Fluoroscopic guided cholecystostomy tube check and change  Findings: Please refer to procedural dictation for full description.  Indwelling catheter in good position.  Mild erythema at skin entry site.  Catheter successfully exchanged for similar 10 Fr pigtail drain.    Complications: None immediate  Estimated Blood Loss: <5 mL  Recommendations: Keep to bag drainage. Return in 8 weeks for routine check/change, or sooner if needed.   Ruthann Cancer, MD Pager: [phone]

## 2020-11-11 ENCOUNTER — Other Ambulatory Visit: Payer: Self-pay | Admitting: Interventional Radiology

## 2020-11-11 DIAGNOSIS — K810 Acute cholecystitis: Secondary | ICD-10-CM

## 2020-11-15 NOTE — Progress Notes (Signed)
Patient on schedule for Chole tube exchange 11/18/2020, made aware to be here @ 0830, stated understanding.

## 2020-11-18 ENCOUNTER — Other Ambulatory Visit: Payer: Self-pay

## 2020-11-18 ENCOUNTER — Ambulatory Visit
Admission: RE | Admit: 2020-11-18 | Discharge: 2020-11-18 | Disposition: A | Discharge status: Home / Self Care | Payer: Medicare Other | Source: Ambulatory Visit | Attending: Interventional Radiology | Admitting: Interventional Radiology

## 2020-11-18 DIAGNOSIS — Z434 Encounter for attention to other artificial openings of digestive tract: Secondary | ICD-10-CM | POA: Insufficient documentation

## 2020-11-18 DIAGNOSIS — K810 Acute cholecystitis: Secondary | ICD-10-CM | POA: Insufficient documentation

## 2020-11-18 NOTE — Procedures (Signed)
Interventional Radiology Procedure Note  Procedure: Cholecystostomy tube exchange  Findings: Please refer to procedural dictation for full description.  Successful exchange.  Persistently occluded cystic duct.  Complications: None  Estimated Blood Loss: <5 mL  Recommendations: Keep to bag drainage. Return in 2-3 months for routine check/exchange.   Ruthann Cancer, MD Pager: [phone]

## 2021-01-02 ENCOUNTER — Other Ambulatory Visit: Payer: Self-pay | Admitting: Interventional Radiology

## 2021-01-02 DIAGNOSIS — K810 Acute cholecystitis: Secondary | ICD-10-CM

## 2021-01-13 ENCOUNTER — Other Ambulatory Visit: Payer: Self-pay

## 2021-01-13 ENCOUNTER — Ambulatory Visit
Admission: RE | Admit: 2021-01-13 | Discharge: 2021-01-13 | Disposition: A | Discharge status: Home / Self Care | Payer: Medicare Other | Source: Ambulatory Visit | Attending: Interventional Radiology | Admitting: Interventional Radiology

## 2021-01-13 DIAGNOSIS — K8000 Calculus of gallbladder with acute cholecystitis without obstruction: Secondary | ICD-10-CM | POA: Diagnosis present

## 2021-01-13 DIAGNOSIS — K810 Acute cholecystitis: Secondary | ICD-10-CM | POA: Insufficient documentation

## 2021-01-13 MED ORDER — IOHEXOL 300 MG/ML  SOLN
30.0000 mL | Freq: Once | INTRAMUSCULAR | Status: AC | PRN
  Administered 2021-01-13: 30 mL

## 2021-01-13 NOTE — Progress Notes (Signed)
Referring Physician(s): Sandi Mariscal  Supervising Physician: Mir, Sharen Heck  Patient Status:  [facility] - outpatient  Chief Complaint: Acute cholecystitis s/p percutaneous cholecystostomy 07/21/20. He has undergone several cholangiograms with routine exchanges.   Subjective: He denies any pain or discomfort and states the output from the drain is variable from day to day.   Allergies: Lipitor [atorvastatin calcium]  Medications: Prior to Admission medications   Medication Sig Start Date End Date Taking? Authorizing Provider  ADVAIR HFA 230-21 MCG/ACT inhaler Inhale 1 puff into the lungs 2 (two) times daily as needed.  12/17/19   [provider]  albuterol (VENTOLIN HFA) 108 (90 Base) MCG/ACT inhaler Inhale 2 puffs into the lungs every 6 (six) hours as needed for wheezing or shortness of breath. 12/21/19   Vanessa [place], MD  amiodarone (PACERONE) 200 MG tablet Take 2 tablets (400 mg total) by mouth 2 (two) times daily. 01/28/20   Loletha Grayer, MD  digoxin (LANOXIN) 0.125 MG tablet Take 1 tablet (0.125 mg total) by mouth every other day. 01/28/20   Loletha Grayer, MD  ipratropium-albuterol (DUONEB) 0.5-2.5 (3) MG/3ML SOLN SMARTSIG:3 Milliliter(s) Via Nebulizer Every 6 Hours 08/19/20   [provider]  lactulose (CHRONULAC) 10 GM/15ML solution Take 30 mLs (20 g total) by mouth daily as needed for mild constipation. Patient not taking: Reported on 09/16/2020 07/29/20   Paulette Blanch, MD  metoprolol succinate (TOPROL-XL) 50 MG 24 hr tablet Take 1 tablet (50 mg total) by mouth in the morning and at bedtime. Take with or immediately following a meal. 01/28/20   Loletha Grayer, MD  ondansetron (ZOFRAN ODT) 4 MG disintegrating tablet Take 1 tablet (4 mg total) by mouth every 8 (eight) hours as needed for nausea or vomiting. 07/17/20   Paulette Blanch, MD  Rivaroxaban (XARELTO) 15 MG TABS tablet Take 15 mg by mouth daily with supper.    [provider]  sacubitril-valsartan  (ENTRESTO) 49-51 MG Take 1 tablet by mouth 2 (two) times daily.    [provider]  torsemide (DEMADEX) 20 MG tablet Take 20 mg by mouth daily.    [provider]  traMADol (ULTRAM) 50 MG tablet Take 1 tablet (50 mg total) by mouth every 6 (six) hours as needed. 07/17/20   Paulette Blanch, MD     Vital Signs: There were no vitals taken for this visit.  Physical Exam Constitutional:      General: He is not in acute distress.    Appearance: He is obese.  Pulmonary:     Effort: Pulmonary effort is normal.  Abdominal:     Palpations: Abdomen is soft.     Tenderness: There is no abdominal tenderness.     Comments: RUQ drain to gravity. Approximately 100 ml of thin bilious fluid in bag. Skin insertion site is unremarkable. Suture and stat-lock in place.   Skin:    General: Skin is warm and dry.  Neurological:     Mental Status: He is alert and oriented to person, place, and time.     Imaging: No results found.  Labs:  CBC: Recent Labs    07/22/20 0548 07/23/20 0718 07/28/20 1659 09/16/20 1018  WBC 13.3* 10.3 14.3* 9.3  HGB 13.1 14.1 16.1 15.8  HCT 41.2 43.9 48.4 49.0  PLT 221 232 321 213    COAGS: Recent Labs    07/22/20 0548  INR 1.2    BMP: Recent Labs    07/22/20 0548 07/23/20 0718 07/24/20 0535 07/28/20  1659 09/16/20 1018  NA 139 141 143 136 140  K 4.1 4.3 4.0 4.3 4.2  CL 105 106 106 101 106  CO2 24 28 26 25 25   GLUCOSE 113* 116* 108* 142* 107*  BUN 18 17 17 15 13   CALCIUM 8.6* 8.7* 8.8* 8.6* 9.1  CREATININE 0.92 0.96 0.92 1.10 1.12  GFRNONAA >60 >60 >60 >60 >60  GFRAA >60 >60 >60 >60  --     LIVER FUNCTION TESTS: Recent Labs    07/23/20 0718 07/24/20 0535 07/28/20 1659 09/16/20 1018  BILITOT 2.0* 1.2 1.2 0.7  AST 17 12* 20 13*  ALT 27 22 21 9   ALKPHOS 120 106 122 69  PROT 6.2* 6.4* 7.0 7.3  ALBUMIN 2.3* 2.4* 2.9* 3.6    Assessment and Plan:  Acute cholecystitis s/p percutaneous cholecystostomy 07/21/20: Stanley Marks,  80 year old male, presented today to the [facility] IR department for a cholangiogram. Due to his co-morbidities he is a poor surgical candidate. Today's images show the tube to be in proper position and the cystic duct is widely patent. Edison Simon, PA with [facility] was consulted for further guidance and the decision was made to attempt a capping trial. The drain was disconnected from the gravity bag and a cap was placed. The patient was instructed that if he has any pain/discomfort he is to place the drain back to a gravity bag and he was given a new one to take home. He will return to [facility] in one week and if he is not having any pain or other complications the drain will be removed. An appointment has been scheduled for 01/20/21 at 0830. The patient verbalized understanding of these instructions and he was wheeled out to the [place] parking area.    Electronically Signed: Soyla Dryer, AGACNP-BC [phone] 01/13/2021, 10:14 AM   I spent a total of 35 Minutes at the the patient's bedside AND on the patient's hospital floor or unit, greater than 50% of which was counseling/coordinating care for percutaneous cholecystostomy care.

## 2021-01-17 NOTE — Progress Notes (Signed)
Patient on schedule for Chole tube exchange/removal 2/11, called and spoke with patient who is aware to be here @ 0830.

## 2021-01-20 ENCOUNTER — Ambulatory Visit
Admission: RE | Admit: 2021-01-20 | Discharge: 2021-01-20 | Disposition: A | Discharge status: Home / Self Care | Payer: Medicare Other | Source: Ambulatory Visit | Attending: Student | Admitting: Student

## 2021-01-20 ENCOUNTER — Other Ambulatory Visit: Payer: Self-pay

## 2021-01-20 DIAGNOSIS — Z434 Encounter for attention to other artificial openings of digestive tract: Secondary | ICD-10-CM | POA: Insufficient documentation

## 2021-01-20 DIAGNOSIS — K8000 Calculus of gallbladder with acute cholecystitis without obstruction: Secondary | ICD-10-CM | POA: Diagnosis not present

## 2021-01-20 HISTORY — PX: IR REMOVAL BILIARY DRAIN: IMG6047

## 2021-01-20 MED ORDER — IODIXANOL 320 MG/ML IV SOLN
50.0000 mL | Freq: Once | INTRAVENOUS | Status: AC
  Administered 2021-01-20: 10 mL

## 2021-01-20 NOTE — Procedures (Signed)
Pre procedural Dx: Cholecysitis Post procedural Dx: Same  Successful fluoroscopic guided exchange, repositioning and up sizing of now 12 French cholecystostomy tube.   EBL: None Complications: None immediate  PLAN:  - The patient's cholecystostomy tube was reconnected to a gravity bag.   - Routine fluoroscopic guided cholecystostomy tube exchange in 8 weeks.   Ronny Bacon, MD Pager #: [phone]

## 2021-01-20 NOTE — Progress Notes (Signed)
Chief Complaint: Acute cholecystitis. Patient presents for cholecystomy tube exchange  Referring Physician(s): Stanley Marks  Supervising Physician: Stanley Marks  Patient Status: [facility] - Out-pt  History of Present Illness: Stanley Marks is a 80 y.o. male History of HTN, SSC, COPD, PVD, CHF, A fib (on xarelto) . Patient presented to [facility] ED for generalized weakness with visit in ED 2 weeks prior for abdominal pain nausea and vomiting. Unequivocal [place] was performed and it the patient was deemed to have acute cholecystitis. The patient was deemed not to be a surgical candidate and IR placed a cholecystostomy tube on 8.13.21.  Patient previously failed a capping trial on 9.9.21. The cholecystostomy tube was last exchanged on 12.10.21. Cholangiogram performed on 2.4.22 show the cystic and common bile duct patent and the drain was capped.  Mr. Stanley Marks is being followed by [place].   Currently without any significant complaints. Patient alert and laying in bed, calm and comfortable. Denies any fevers, headache, chest pain, SOB, cough, abdominal pain, nausea, vomiting or bleeding. Mr. Stanley Marks states that he was capped until 2.10.22 when he had persistent and worsening RUQ pain that did not resolved with OTC pain relievers. He ultimately un-capped the cholecystomy tube and placed it to a bag and the pain resolved.   Return precautions and treatment recommendations and follow-up discussed with the patient who is agreeable with the plan.    Past Medical History:  Diagnosis Date  . Atrial fibrillation (Stanley Marks)    on Xarelto  . Basal cell carcinoma 08/24/2014   chin, L nose ant alar crease  . Cancer (Stanley Marks)    skin  . CHF (congestive heart failure) (Stanley Marks)   . Emphysema of lung (Stanley Marks)   . Hypertension   . Obesity (BMI 30-39.9)   . Obstructive sleep apnea   . Pneumonia due to COVID-19 virus    November 2021  . PVD (peripheral vascular disease) (Stanley Marks)   . 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 APPENDECTOMY    . arm surgery    . ELECTROPHYSIOLOGIC STUDY N/A 11/20/2016   Procedure: CARDIOVERSION;  Surgeon: Dionisio David, MD;  Location: [facility] ORS;  Service: Cardiovascular;  Laterality: N/A;  . IR CHOLANGIOGRAM EXISTING TUBE  08/18/2020  . IR PERC CHOLECYSTOSTOMY  07/22/2020    Allergies: Lipitor [atorvastatin calcium]  Medications: Prior to Admission medications   Medication Sig Start Date End Date Taking? Authorizing Provider  ADVAIR HFA 230-21 MCG/ACT inhaler Inhale 1 puff into the lungs 2 (two) times daily as needed.  12/17/19   [provider]  albuterol (VENTOLIN HFA) 108 (90 Base) MCG/ACT inhaler Inhale 2 puffs into the lungs every 6 (six) hours as needed for wheezing or shortness of breath. 12/21/19   Vanessa [place], MD  amiodarone (PACERONE) 200 MG tablet Take 2 tablets (400 mg total) by mouth 2 (two) times daily. 01/28/20   Loletha Grayer, MD  digoxin (LANOXIN) 0.125 MG tablet Take 1 tablet (0.125 mg total) by mouth every other day. 01/28/20   Loletha Grayer, MD  ipratropium-albuterol (DUONEB) 0.5-2.5 (3) MG/3ML SOLN SMARTSIG:3 Milliliter(s) Via Nebulizer Every 6 Hours 08/19/20   [provider]  lactulose (CHRONULAC) 10 GM/15ML solution Take 30 mLs (20 g total) by mouth daily as needed for mild constipation. Patient not taking: Reported on 09/16/2020 07/29/20  Stanley Blanch, MD  metoprolol succinate (TOPROL-XL) 50 MG 24 hr tablet Take 1 tablet (50 mg total) by mouth in the morning and at bedtime. Take with or immediately following a meal. 01/28/20   Loletha Grayer, MD  ondansetron (ZOFRAN ODT) 4 MG disintegrating tablet Take 1 tablet (4 mg total) by mouth every 8 (eight) hours as needed for nausea or vomiting. 07/17/20   Stanley Blanch, MD  Rivaroxaban (XARELTO) 15 MG TABS tablet Take  15 mg by mouth daily with supper.    [provider]  sacubitril-valsartan (ENTRESTO) 49-51 MG Take 1 tablet by mouth 2 (two) times daily.    [provider]  torsemide (DEMADEX) 20 MG tablet Take 20 mg by mouth daily.    [provider]  traMADol (ULTRAM) 50 MG tablet Take 1 tablet (50 mg total) by mouth every 6 (six) hours as needed. 07/17/20   Stanley Blanch, MD     No family history on file.  Social History   Socioeconomic History  . Marital status: Widowed    Spouse name: Not on file  . Number of children: Not on file  . Years of education: Not on file  . Highest education level: Not on file  Occupational History  . Not on file  Tobacco Use  . Smoking status: Current Every Day Smoker    Packs/day: 1.00    Types: Cigarettes    Start date: 12/10/1956  . Smokeless tobacco: Never Used  Vaping Use  . Vaping Use: Never used  Substance and Sexual Activity  . 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 Drug use: Never  . Sexual activity: Not on file  Other Topics Concern  . Not on file  Social History Narrative  . Not on file   Social Determinants of Health   Financial Resource Strain: Not on file  Food Insecurity: Not on file  Transportation Needs: Not on file  Physical Activity: Not on file  Stress: Not on file  Social Connections: Not on file     Review of Systems: A 12 point ROS discussed and pertinent positives are indicated in the HPI above.  All other systems are negative.  Review of Systems  Constitutional: Negative for fever.  HENT: Negative for congestion.   Respiratory: Negative for cough and shortness of breath.   Cardiovascular: Negative for chest pain.  Gastrointestinal: Negative for abdominal pain.  Neurological: Negative for headaches.  Psychiatric/Behavioral: Negative for behavioral problems and confusion.    Vital Signs: There were no vitals taken for this visit.  Physical Exam Vitals  and nursing note reviewed.  Constitutional:      Appearance: He is well-developed and well-nourished.  HENT:     Head: Normocephalic.  Pulmonary:     Effort: Pulmonary effort is normal.  Abdominal:     Comments: Positive RUQ drain to  gravity bag.  Dressing is clean dry and intact. 5 ml of  dark brown colored fluid noted in gravity bag.    Musculoskeletal:        General: Normal range of motion.     Cervical back: Normal range of motion.  Skin:    General: Skin is dry.  Neurological:     Mental Status: He is alert and oriented to person, place, and time.  Psychiatric:        Mood and Affect: Mood and affect normal.     Imaging: DG CHOLANGIOGRAM  EXISTING TUBE  Result Date: 01/13/2021 INDICATION: 80 year old gentleman status post cholecystostomy drain placement on 07/22/2020 for treatment of acute cholecystitis given the patient has a very poor surgical candidate. Capping trial aside attempted on 08/18/2020, however unfortunately the patient failed capping trial at that time. Catheters were exchanged on 09/22/2020 and 11/18/2020. He returns again for drain check and possible exchange. EXAM: Cholecystostomy drain check MEDICATIONS: None ANESTHESIA/SEDATION: None COMPLICATIONS: None immediate. PROCEDURE: Scout image demonstrates a right upper quadrant drain in unchanged position. Cholangiogram showed the cystic and common bile duct to be patent with free flow of contrast into the duodenum. Drain was flushed and capped. IMPRESSION: Cholangiogram shows patent cystic and common bile ducts with flow of contrast from the gallbladder to the duodenum. Patient previously failed capping trial on 08/18/2020. Case was again discussed with surgical PA Loel Dubonnet who reiterated that the patient is not a surgical candidate. PLAN: - the patient's cholecystostomy tube was capped for a trial - the patient was given an extra gravity bag and instructed to reconnect the cholecystostomy tube to the gravity bag  if he experiences recurrent right upper quadrant obstructive symptoms. The patient demonstrated fair understanding of this discussion. - the patient will return to [facility] next week for repeat cholangiogram. As patient is a poor operative candidate, if the patient passes his trial of internalization, the cholecystostomy tube may be removed. -plan was discussed with Loel Dubonnet PA. Electronically Signed   By: Miachel Roux M.D.   On: 01/13/2021 13:10    Labs:  CBC: Recent Labs    07/22/20 0548 07/23/20 0718 07/28/20 1659 09/16/20 1018  WBC 13.3* 10.3 14.3* 9.3  HGB 13.1 14.1 16.1 15.8  HCT 41.2 43.9 48.4 49.0  PLT 221 232 321 213    COAGS: Recent Labs    07/22/20 0548  INR 1.2    BMP: Recent Labs    07/22/20 0548 07/23/20 0718 07/24/20 0535 07/28/20 1659 09/16/20 1018  NA 139 141 143 136 140  K 4.1 4.3 4.0 4.3 4.2  CL 105 106 106 101 106  CO2 24 28 26 25 25   GLUCOSE 113* 116* 108* 142* 107*  BUN 18 17 17 15 13   CALCIUM 8.6* 8.7* 8.8* 8.6* 9.1  CREATININE 0.92 0.96 0.92 1.10 1.12  GFRNONAA >60 >60 >60 >60 >60  GFRAA >60 >60 >60 >60  --     LIVER FUNCTION TESTS: Recent Labs    07/23/20 0718 07/24/20 0535 07/28/20 1659 09/16/20 1018  BILITOT 2.0* 1.2 1.2 0.7  AST 17 12* 20 13*  ALT 27 22 21 9   ALKPHOS 120 106 122 69  PROT 6.2* 6.4* 7.0 7.3  ALBUMIN 2.3* 2.4* 2.9* 3.6    Assessment and Plan:  80 y.o. male outpatient. History of HTN, SSC, COPD, PVD, CHF, A fib (on xarelto) . Patient presented to [facility] ED for generalized weakness with visit in ED 2 weeks prior for abdominal pain nausea and vomiting. Unequivocal [place] was performed and it the patient was deemed to have acute cholecystitis. The patient was deemed not to be a surgical candidate and IR placed a cholecystostomy tube on 8.13.21.  Patient previously failed a capping trial on 9.9.21. The cholecystostomy tube was last exchanged on 12.10.21. Cholangiogram performed on 2.4.22 show  the cystic and common bile duct patent and the drain was capped. Dower is being followed by [place]. After discussion with Mr. Deveny it appears he has failed the capping trial and he arrived to IR  with the cholecystomy tube to a gravity bag. Due  to this the tube will not be removed at this time. Patient [resetns for cholecystomy tube exchange..   Risks and benefits discussed with the patient including bleeding, infection, damage to adjacent structures, bowel perforation/fistula connection, and sepsis.  All of the patient's questions were answered, patient is agreeable to proceed. Consent signed and in chart.   Thank you for this interesting consult.  I greatly enjoyed meeting Avish Buntyn and look forward to participating in their care.  A copy of this report was sent to the requesting provider on this date.  Electronically Signed: Jacqualine Mau, NP 01/20/2021, 8:41 AM   I spent a total of  30 Minutes   in face to face in clinical consultation, greater than 50% of which was counseling/coordinating care for cholecystotomy tube exchange

## 2021-01-26 ENCOUNTER — Other Ambulatory Visit: Payer: Self-pay | Admitting: Interventional Radiology

## 2021-01-26 DIAGNOSIS — K819 Cholecystitis, unspecified: Secondary | ICD-10-CM

## 2021-03-15 ENCOUNTER — Other Ambulatory Visit: Admission: RE | Admit: 2021-03-15 | Payer: Medicare Other | Source: Ambulatory Visit

## 2021-03-15 ENCOUNTER — Other Ambulatory Visit: Payer: Self-pay | Admitting: Radiology

## 2021-03-17 ENCOUNTER — Ambulatory Visit: Admission: RE | Admit: 2021-03-17 | Payer: Medicare Other | Source: Ambulatory Visit

## 2021-05-05 ENCOUNTER — Ambulatory Visit
Admission: RE | Admit: 2021-05-05 | Discharge: 2021-05-05 | Disposition: A | Discharge status: Home / Self Care | Payer: Medicare Other | Source: Ambulatory Visit | Attending: Interventional Radiology | Admitting: Interventional Radiology

## 2021-05-05 ENCOUNTER — Other Ambulatory Visit: Payer: Self-pay

## 2021-05-05 DIAGNOSIS — Z434 Encounter for attention to other artificial openings of digestive tract: Secondary | ICD-10-CM | POA: Diagnosis present

## 2021-05-05 DIAGNOSIS — K819 Cholecystitis, unspecified: Secondary | ICD-10-CM | POA: Diagnosis not present

## 2021-05-05 HISTORY — PX: IR CATHETER TUBE CHANGE: IMG717

## 2021-05-18 ENCOUNTER — Telehealth: Payer: Self-pay

## 2021-05-18 ENCOUNTER — Telehealth: Payer: Self-pay | Admitting: General Surgery

## 2021-05-18 ENCOUNTER — Ambulatory Visit (INDEPENDENT_AMBULATORY_CARE_PROVIDER_SITE_OTHER): Payer: Medicare Other | Admitting: General Surgery

## 2021-05-18 ENCOUNTER — Encounter: Payer: Self-pay | Admitting: General Surgery

## 2021-05-18 ENCOUNTER — Other Ambulatory Visit: Payer: Self-pay

## 2021-05-18 VITALS — BP 119/72 | HR 73 | Temp 98.2°F | Ht 73.0 in | Wt 294.8 lb

## 2021-05-18 DIAGNOSIS — K8000 Calculus of gallbladder with acute cholecystitis without obstruction: Secondary | ICD-10-CM | POA: Diagnosis not present

## 2021-05-18 NOTE — Telephone Encounter (Signed)
Trying to contact patient to get surgery scheduled.  Unable to leave message, his voice mail box is not set up.  Will continue trying.

## 2021-05-18 NOTE — Telephone Encounter (Signed)
Cardiac /Anticoagulant clearance received from [facility] need to bridge hold Xarelto 3 days prior to surgery.Patient optimized at low risk.    Hold Xarelto 3 days prior to having surgery.   Patient notified to hold Xeralto 3 days prior to surgery.

## 2021-05-18 NOTE — Progress Notes (Signed)
Patient ID: Stanley Marks, male   DOB: 08-15-1941, 80 y.o.   MRN: [id number]  Chief Complaint  Patient presents with   Follow-up    cholecystitis    HPI Stanley Marks is a 80 y.o. male.   I last saw him in September 2021.  My HPI from that visit is copied here:  "80 y.o. male who initially presented to [facility] ED on 08/08 with reports of 24 hours of upper abdominal pain associated with nausea, emesis, and diarrhea. Work up at that time was concerning for mild leukocytosis and RUQ US showed cholelithiasis without evidence of cholecystitis. After discussion with Dr Hampton Abbot, he was sent home on Augmentin and close follow up in our office. Unfortunately, he never made it to that appointment. He presented again to [facility] ED yesterday (08/11) afternoon with new complaints of weakness. This has been progressively worsening over the course of the last few days, and ultimately resulted in what he described as a mechanical fall. He reportedly "sat down on the floor in the bathroom" but did not hit his head. No LOC. He is on Xarelto secondary to history of Afib with RVR. He reports that his abdominal pain from his previous presentation has resolved along with the N/V/D. Only major associated symptoms is decreased PO intake. No fever, chills, cough, CP, SOB, bowel changes, or urinary changes. No juandice, orange colored urine, or acholic stools. Only previous abdominal surgery was an appendectomy. Work up in the ED yesterday revealed worsening leukocytosis to 20.7K (which is improved to 15K this morning), AKI with sCr - 1.31 (which is also improved to 1.27 this morning), and he was found to have new hyperbilirubinemia to 2.9 this morning on labs which was not seen on 08/07. RUQ [place] was again equivocal for cholecystitis.   Due to his extensive medical comorbidities, he was felt to represent a prohibitive surgical candidate.  A percutaneous cholecystostomy tube was placed.  He followed up with interventional radiology on  September 9 for a follow-up cholangiogram.  Per the radiologist who performed the study, the patient was tolerating the tube well, without complaints.  Cholangiogram showed patency of the cystic duct and the tube was capped for a trial of internalization.  Apparently, he tolerated this poorly and was returned to drainage.  Follow-up cholangiogram on 15 September demonstrated, once again, patency of the cystic duct and common bile ducts.  Due to his failure at internalization, he has been scheduled for follow-up in interventional radiology on October 1.  He was referred back to general surgery for further discussion.   Today, he states that he does have some pain at the drain site, but no other abdominal pain.  He is not experiencing any fevers or chills.  Bowel function is regular.  He expresses frustration with the fact that he has to continue having the tube.  He states that he does not go anywhere because he finds the bag embarrassing and he is unable to lie down flat at night secondary to the drain pulling on his skin.  He states that he wants his gallbladder out."  At that visit, I felt that Stanley Marks still presented a prohibitive operative risk and I did not feel comfortable performing surgery on him.  He has had several tube interrogations and subsequent capping trials, but he has had pain and nausea each time.  He recently saw his cardiologist, Dr. Chancy Milroy, and provides a note from that visit deeming him low risk for gallbladder surgery.  He would like  to have the operation.  He states that he continues to manage the drain, which he still finds cumbersome.  He occasionally has nausea but this is alleviated with Zofran.  He endorses a good appetite, normal bowel function and denies any fevers or chills.  He continues to have some discomfort at the drain entry site.     Past Medical History:  Diagnosis Date   Atrial fibrillation ([place])    on Xarelto   Basal cell carcinoma 08/24/2014   chin, L nose ant  alar crease   Cancer ([facility])    skin   CHF (congestive heart failure) ([facility])    Emphysema of lung ([facility])    Hypertension    Obesity (BMI 30-39.9)    Obstructive sleep apnea    Pneumonia due to COVID-19 virus    November 2021   PVD (peripheral vascular disease) ([place])    Squamous cell carcinoma of skin 09/07/2014, 03/23/2020   L dorsal hand   Squamous cell carcinoma of skin 01/17/2015   R prox lateral dorsum of hand    Past Surgical History:  Procedure Laterality Date   APPENDECTOMY     arm surgery     ELECTROPHYSIOLOGIC STUDY N/A 11/20/2016   Procedure: CARDIOVERSION;  Surgeon: Dionisio David, MD;  Location: [facility] ORS;  Service: Cardiovascular;  Laterality: N/A;   IR CATHETER TUBE CHANGE  05/05/2021   IR CHOLANGIOGRAM EXISTING TUBE  08/18/2020   IR PERC CHOLECYSTOSTOMY  07/22/2020   IR REMOVAL BILIARY DRAIN  01/20/2021    History reviewed. No pertinent family history.  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      amiodarone (PACERONE) 200 MG tablet Take 2 tablets (400 mg total) by mouth 2 (two) times daily. 120 tablet 0   metoprolol succinate (TOPROL-XL) 50 MG 24 hr tablet Take 1 tablet (50 mg total) by mouth in the morning and at bedtime. Take with or immediately following a meal. 60 tablet 0   ondansetron (ZOFRAN ODT) 4 MG disintegrating tablet Take 1 tablet (4 mg total) by mouth every 8 (eight)  hours as needed for nausea or vomiting. 30 tablet 0   Rivaroxaban (XARELTO) 15 MG TABS tablet Take 15 mg by mouth daily with supper.     sacubitril-valsartan (ENTRESTO) 49-51 MG Take 1 tablet by mouth 2 (two) times daily.     torsemide (DEMADEX) 20 MG tablet Take 20 mg by mouth daily.     No current facility-administered medications for this visit.    Review of Systems Review of Systems  All other systems reviewed and are negative. Or as discussed above.  Blood pressure 119/72, pulse 73, temperature 98.2 F (36.8 C), temperature source Oral, height 6\' 1"  (1.854 m), weight 294 lb 12.8 oz (133.7 kg), SpO2 92 %. Body mass  index is 38.89 kg/m.   Physical Exam Physical Exam Constitutional:      General: He is not in acute distress.    Appearance: He is obese.  HENT:     Head: Normocephalic and atraumatic.     Nose:     Comments: Covered with a mask    Mouth/Throat:     Comments: Covered with a mask Eyes:     General: No scleral icterus.       Right eye: No discharge.        Left eye: No discharge.  Neck:     Comments: No palpable cervical or supraclavicular lymphadenopathy.  The trachea is midline.  No thyromegaly or dominant thyroid masses appreciated.  The gland moves freely with deglutition. Cardiovascular:     Rate and Rhythm: Normal rate.  Pulmonary:     Effort: Pulmonary effort is normal. No respiratory distress.  Abdominal:     Palpations: Abdomen is soft.     Comments: Protuberant, consistent with his level of obesity.  There is a percutaneous biliary drain in his right upper quadrant.  There is clear amber bile in the drainage bag.  Genitourinary:    Comments: Deferred Musculoskeletal:     Right lower leg: Edema present.     Left lower leg: Edema present.     Comments: Extensive venous stasis changes in the bilateral lower extremities  Skin:    Comments: Multiple scaly lesions on the bilateral upper extremities  Neurological:     General: No focal deficit  present.     Mental Status: He is alert and oriented to person, place, and time.  Psychiatric:        Mood and Affect: Mood normal.        Behavior: Behavior normal.    Data Reviewed I reviewed the printed note provided from Dr. Marella Bile office.  It does state that he is low risk for cholecystectomy and recommends that he undergo the surgery.  I also reviewed the multiple drain interrogations that have been performed, demonstrating patency of the cystic duct as well as the subsequent failures of capping trials.  Assessment This is a 80 year old man who has had a percutaneous cholecystostomy tube in place since August 2021.  He has multiple medical comorbidities that have raised questions in the past regarding his fitness to undergo surgery.  He recently saw his cardiologist and has been given clearance to proceed with an operation.  Plan I have offered him a robot-assisted laparoscopic cholecystectomy.  The risks of the operation were discussed with him.  These include, but are not limited to, bleeding, infection, damage to surrounding tissues or structures, bile leak, retained stones, need for additional interventions or procedures, as well as the risks of general anesthesia.  Due to his medical comorbidities, we will request formal documentation of the clearance as well as recommendations regarding his anticoagulation.  I will also place a referral to anesthesiology for a formal evaluation prior to his operation.  I plan to observe him overnight rather than send him home the same day, again, secondary to his multiple health issues.    Fredirick Maudlin 05/18/2021, 11:03 AM

## 2021-05-18 NOTE — H&P (View-Only) (Signed)
Patient ID: Stanley Marks, male   DOB: May 12, 1941, 80 y.o.   MRN: [id number]  Chief Complaint  Patient presents with   Follow-up    cholecystitis    HPI Stanley Marks is a 80 y.o. male.   I last saw him in September 2021.  My HPI from that visit is copied here:  "80 y.o. male who initially presented to [facility] ED on 08/08 with reports of 24 hours of upper abdominal pain associated with nausea, emesis, and diarrhea. Work up at that time was concerning for mild leukocytosis and RUQ US showed cholelithiasis without evidence of cholecystitis. After discussion with Dr Hampton Abbot, he was sent home on Augmentin and close follow up in our office. Unfortunately, he never made it to that appointment. He presented again to [facility] ED yesterday (08/11) afternoon with new complaints of weakness. This has been progressively worsening over the course of the last few days, and ultimately resulted in what he described as a mechanical fall. He reportedly "sat down on the floor in the bathroom" but did not hit his head. No LOC. He is on Xarelto secondary to history of Afib with RVR. He reports that his abdominal pain from his previous presentation has resolved along with the N/V/D. Only major associated symptoms is decreased PO intake. No fever, chills, cough, CP, SOB, bowel changes, or urinary changes. No juandice, orange colored urine, or acholic stools. Only previous abdominal surgery was an appendectomy. Work up in the ED yesterday revealed worsening leukocytosis to 20.7K (which is improved to 15K this morning), AKI with sCr - 1.31 (which is also improved to 1.27 this morning), and he was found to have new hyperbilirubinemia to 2.9 this morning on labs which was not seen on 08/07. RUQ [place] was again equivocal for cholecystitis.   Due to his extensive medical comorbidities, he was felt to represent a prohibitive surgical candidate.  A percutaneous cholecystostomy tube was placed.  He followed up with interventional radiology on  September 9 for a follow-up cholangiogram.  Per the radiologist who performed the study, the patient was tolerating the tube well, without complaints.  Cholangiogram showed patency of the cystic duct and the tube was capped for a trial of internalization.  Apparently, he tolerated this poorly and was returned to drainage.  Follow-up cholangiogram on 15 September demonstrated, once again, patency of the cystic duct and common bile ducts.  Due to his failure at internalization, he has been scheduled for follow-up in interventional radiology on October 1.  He was referred back to general surgery for further discussion.   Today, he states that he does have some pain at the drain site, but no other abdominal pain.  He is not experiencing any fevers or chills.  Bowel function is regular.  He expresses frustration with the fact that he has to continue having the tube.  He states that he does not go anywhere because he finds the bag embarrassing and he is unable to lie down flat at night secondary to the drain pulling on his skin.  He states that he wants his gallbladder out."  At that visit, I felt that Stanley Marks still presented a prohibitive operative risk and I did not feel comfortable performing surgery on him.  He has had several tube interrogations and subsequent capping trials, but he has had pain and nausea each time.  He recently saw his cardiologist, Dr. Chancy Milroy, and provides a note from that visit deeming him low risk for gallbladder surgery.  He would like  to have the operation.  He states that he continues to manage the drain, which he still finds cumbersome.  He occasionally has nausea but this is alleviated with Zofran.  He endorses a good appetite, normal bowel function and denies any fevers or chills.  He continues to have some discomfort at the drain entry site.     Past Medical History:  Diagnosis Date   Atrial fibrillation ([place])    on Xarelto   Basal cell carcinoma 08/24/2014   chin, L nose ant  alar crease   Cancer ([facility])    skin   CHF (congestive heart failure) (H[facility])    Emphysema of lung (H[facility])    Hypertension    Obesity (BMI 30-39.9)    Obstructive sleep apnea    Pneumonia due to COVID-19 virus    November 2021   PVD (peripheral vascular disease) ([place])    Squamous cell carcinoma of skin 09/07/2014, 03/23/2020   L dorsal hand   Squamous cell carcinoma of skin 01/17/2015   R prox lateral dorsum of hand    Past Surgical History:  Procedure Laterality Date   APPENDECTOMY     arm surgery     ELECTROPHYSIOLOGIC STUDY N/A 11/20/2016   Procedure: CARDIOVERSION;  Surgeon: Dionisio David, MD;  Location: [facility] ORS;  Service: Cardiovascular;  Laterality: N/A;   IR CATHETER TUBE CHANGE  05/05/2021   IR CHOLANGIOGRAM EXISTING TUBE  08/18/2020   IR PERC CHOLECYSTOSTOMY  07/22/2020   IR REMOVAL BILIARY DRAIN  01/20/2021    History reviewed. No pertinent family history.  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      amiodarone (PACERONE) 200 MG tablet Take 2 tablets (400 mg total) by mouth 2 (two) times daily. 120 tablet 0   metoprolol succinate (TOPROL-XL) 50 MG 24 hr tablet Take 1 tablet (50 mg total) by mouth in the morning and at bedtime. Take with or immediately following a meal. 60 tablet 0   ondansetron (ZOFRAN ODT) 4 MG disintegrating tablet Take 1 tablet (4 mg total) by mouth every 8 (eight)  hours as needed for nausea or vomiting. 30 tablet 0   Rivaroxaban (XARELTO) 15 MG TABS tablet Take 15 mg by mouth daily with supper.     sacubitril-valsartan (ENTRESTO) 49-51 MG Take 1 tablet by mouth 2 (two) times daily.     torsemide (DEMADEX) 20 MG tablet Take 20 mg by mouth daily.     No current facility-administered medications for this visit.    Review of Systems Review of Systems  All other systems reviewed and are negative. Or as discussed above.  Blood pressure 119/72, pulse 73, temperature 98.2 F (36.8 C), temperature source Oral, height 6\' 1"  (1.854 m), weight 294 lb 12.8 oz (133.7 kg), SpO2 92 %. Body mass  index is 38.89 kg/m.   Physical Exam Physical Exam Constitutional:      General: He is not in acute distress.    Appearance: He is obese.  HENT:     Head: Normocephalic and atraumatic.     Nose:     Comments: Covered with a mask    Mouth/Throat:     Comments: Covered with a mask Eyes:     General: No scleral icterus.       Right eye: No discharge.        Left eye: No discharge.  Neck:     Comments: No palpable cervical or supraclavicular lymphadenopathy.  The trachea is midline.  No thyromegaly or dominant thyroid masses appreciated.  The gland moves freely with deglutition. Cardiovascular:     Rate and Rhythm: Normal rate.  Pulmonary:     Effort: Pulmonary effort is normal. No respiratory distress.  Abdominal:     Palpations: Abdomen is soft.     Comments: Protuberant, consistent with his level of obesity.  There is a percutaneous biliary drain in his right upper quadrant.  There is clear amber bile in the drainage bag.  Genitourinary:    Comments: Deferred Musculoskeletal:     Right lower leg: Edema present.     Left lower leg: Edema present.     Comments: Extensive venous stasis changes in the bilateral lower extremities  Skin:    Comments: Multiple scaly lesions on the bilateral upper extremities  Neurological:     General: No focal deficit  present.     Mental Status: He is alert and oriented to person, place, and time.  Psychiatric:        Mood and Affect: Mood normal.        Behavior: Behavior normal.    Data Reviewed I reviewed the printed note provided from Dr. Marella Bile office.  It does state that he is low risk for cholecystectomy and recommends that he undergo the surgery.  I also reviewed the multiple drain interrogations that have been performed, demonstrating patency of the cystic duct as well as the subsequent failures of capping trials.  Assessment This is a 80 year old man who has had a percutaneous cholecystostomy tube in place since August 2021.  He has multiple medical comorbidities that have raised questions in the past regarding his fitness to undergo surgery.  He recently saw his cardiologist and has been given clearance to proceed with an operation.  Plan I have offered him a robot-assisted laparoscopic cholecystectomy.  The risks of the operation were discussed with him.  These include, but are not limited to, bleeding, infection, damage to surrounding tissues or structures, bile leak, retained stones, need for additional interventions or procedures, as well as the risks of general anesthesia.  Due to his medical comorbidities, we will request formal documentation of the clearance as well as recommendations regarding his anticoagulation.  I will also place a referral to anesthesiology for a formal evaluation prior to his operation.  I plan to observe him overnight rather than send him home the same day, again, secondary to his multiple health issues.    Fredirick Maudlin 05/18/2021, 11:03 AM

## 2021-05-18 NOTE — Telephone Encounter (Signed)
Aicoagulant/Cardiac Clearance faxed to [facility].

## 2021-05-18 NOTE — Patient Instructions (Signed)
Our surgery scheduler will call you within 24-48 hours. Please have the [facility] surgery sheet available when speaking with her.   We will send Cardiac Clearance to [facility].     Minimally Invasive Cholecystectomy, Care After This sheet gives you information about how to care for yourself after your procedure. Your doctor may also give you more specific instructions. If you have problems or questions, contact your doctor. What can I expect after the procedure? After the procedure, it is common: To have pain at the areas of surgery. You will be given medicines for pain. To vomit or feel like you may vomit. To feel fullness in the belly (bloating) or to have pain in the shoulder. This comes from the gas that was used during the surgery. Follow these instructions at home: Medicines Take over-the-counter and prescription medicines only as told by your doctor. If you were prescribed an antibiotic medicine, take it as told by your doctor. Do not stop using the antibiotic even if you start to feel better. Ask your doctor if the medicine prescribed to you: Requires you to avoid driving or using machinery. Can cause trouble pooping (constipation). You may need to take these actions to prevent or treat trouble pooping: Drink enough fluid to keep your pee (urine) pale yellow. Take over-the-counter or prescription medicines. Eat foods that are high in fiber. These include beans, whole grains, and fresh fruits and vegetables. Limit foods that are high in fat and sugar. These include fried or sweet foods. Incision care Follow instructions from your doctor about how to take care of your cuts from surgery (incisions). Make sure you: Wash your hands with soap and water for at least 20 seconds before and after you change your bandage (dressing). If you cannot use soap and water, use hand sanitizer. Change your bandage as told by your doctor. Leave stitches (sutures), skin glue, or skin tape (adhesive)  strips in place. They may need to stay in place for 2 weeks or longer. If tape strips get loose and curl up, you may trim the loose edges. Do not remove tape strips completely unless your doctor says it is okay. Do not take baths, swim, or use a hot tub until your doctor approves. Ask your doctor if you may take showers. You may only be allowed to take sponge baths. Check your surgery area every day for signs of infection. Check for: More redness, swelling, or pain. Fluid or blood. Warmth. Pus or a bad smell.   Activity Rest as told by your doctor. Do not sit for a long time without moving. Get up to take short walks every 1-2 hours. This is important. Ask for help if you feel weak or unsteady. Do not lift anything that is heavier than 10 lb (4.5 kg), or the limit that you are told, until your doctor says that it is safe. Do not play contact sports until your doctor says it is okay. Do not return to work or school until your doctor says it is okay. Return to your normal activities as told by your doctor. Ask your doctor what activities are safe for you. General instructions If you were given a medicine to help you relax (sedative) during your procedure, it can affect you for many hours. Do not drive or use machinery until your doctor says that it is safe. Keep all follow-up visits as told by your doctor. This is important. Contact a doctor if: You get a rash. You have more redness, swelling, or  pain around your cuts from surgery. You have fluid or blood coming from your cuts from surgery. Your cuts from surgery feel warm to the touch. You have pus or a bad smell coming from your cuts from surgery. You have a fever. One or more of your cuts from surgery breaks open. Get help right away if: You have trouble breathing. You have chest pain. You have pain that is getting worse in your shoulders. You faint or feel dizzy when you stand. You have very bad pain in your belly (abdomen). You feel  like you may vomit or you vomit, and this lasts for more than one day. You have leg pain. Summary After your surgery, it is common to have pain at the areas of surgery. You may also have vomiting or fullness in the belly. Follow your doctor's instructions about medicine, activity restrictions, and caring for your surgery areas. Do not do activities that require a lot of effort. Contact a doctor if you have a fever or other signs of infection, such as more redness, swelling, or pain around the cuts from surgery. Get help right away if you have chest pain, increasing pain in the shoulders, or trouble breathing. This information is not intended to replace advice given to you by your health care provider. Make sure you discuss any questions you have with your health care provider. Document Revised: 11/10/2019 Document Reviewed: 11/10/2019 Elsevier Patient Education  2021 [employer].

## 2021-05-22 ENCOUNTER — Telehealth: Payer: Self-pay | Admitting: General Surgery

## 2021-05-22 NOTE — Telephone Encounter (Signed)
Patient has been advised of Pre-Admission date/time, COVID Testing date and Surgery date.  Surgery Date: 06/09/21 Preadmission Testing Date: 06/02/21 (phone 8a-1p) Covid Testing Date: 06/07/21 @ 8:20 am  - patient advised to go to the [facility]   Patient has been made aware to call [phone], between 1-3:00pm the day before surgery, to find out what time to arrive for surgery.

## 2021-06-02 ENCOUNTER — Encounter
Admission: RE | Admit: 2021-06-02 | Discharge: 2021-06-02 | Disposition: A | Discharge status: Home / Self Care | Payer: Medicare Other | Source: Ambulatory Visit | Attending: General Surgery | Admitting: General Surgery

## 2021-06-02 ENCOUNTER — Other Ambulatory Visit: Payer: Self-pay

## 2021-06-02 DIAGNOSIS — Z01812 Encounter for preprocedural laboratory examination: Secondary | ICD-10-CM | POA: Insufficient documentation

## 2021-06-02 DIAGNOSIS — R600 Localized edema: Secondary | ICD-10-CM | POA: Insufficient documentation

## 2021-06-02 NOTE — Patient Instructions (Signed)
Your procedure is scheduled on: 06/09/21 Report to [facility]. To find out your arrival time please call [phone] between 1PM - 3PM on 06/08/21.  Remember: Instructions that are not followed completely may result in serious medical risk, up to and including death, or upon the discretion of your surgeon and anesthesiologist your surgery may need to be rescheduled.     _X__ 1. Do not eat food or drink liquids after midnight the night before your procedure.                 No gum chewing or hard candies. You may have sips of water with your medication.  __X__2.  On the morning of surgery brush your teeth with toothpaste and water, you                 may rinse your mouth with mouthwash if you wish.  Do not swallow any              toothpaste of mouthwash.     _X__ 3.  No Alcohol for 24 hours before or after surgery.   _X__ 4.  Do Not Smoke or use e-cigarettes For 24 Hours Prior to Your Surgery.                 Do not use any chewable tobacco products for at least 6 hours prior to                 surgery.  ____  5.  Bring all medications with you on the day of surgery if instructed.   __X__  6.  Notify your doctor if there is any change in your medical condition      (cold, fever, infections).     Do not wear jewelry, make-up, hairpins, clips or nail polish. Do not wear lotions, powders, or perfumes or deodorant. Do not shave body hair 48 hours prior to surgery. Men may shave face and neck. Do not bring valuables to the hospital.    [facility] is not responsible for any belongings or valuables.  Contacts, dentures/partials or body piercings may not be worn into surgery. Bring a case for your contacts, glasses or hearing aids, a denture cup will be supplied. Leave your suitcase in the car. After surgery it may be brought to your room. For patients admitted to the hospital, discharge time is determined by your treatment  team.   Patients discharged the day of surgery will not be allowed to drive home.   Please read over the following fact sheets that you were given:   SAGE WIPES   __X__ Take these medicines the morning of surgery with A SIP OF WATER:    1. amiodarone (PACERONE) 200 MG tablet  2. metoprolol succinate (TOPROL-XL) 50 MG 24 hr tablet  3.   4.  5.  6.  ____ Fleet Enema (as directed)   __X__ Use CHG Soap/SAGE wipes as directed  __X__ Use inhalers on the day of surgery  ____ Stop metformin/Janumet/Farxiga 2 days prior to surgery    ____ Take 1/2 of usual insulin dose the night before surgery. No insulin the morning          of surgery.   __X__ Angela Cox FOR 3 DAYS AS INSTRUCTED BY DR Neoma Laming  __X__ Stop Anti-inflammatories 7 days before surgery such as Advil, Ibuprofen, Motrin,  BC or Goodies Powder, Naprosyn, Naproxen, Aleve, Aspirin    __X__  Stop all herbal supplements, fish oil or vitamin E until after surgery.    ____ Bring C-Pap to the hospital.

## 2021-06-05 ENCOUNTER — Encounter: Payer: Self-pay | Admitting: General Surgery

## 2021-06-05 ENCOUNTER — Telehealth: Payer: Self-pay

## 2021-06-05 NOTE — Telephone Encounter (Signed)
Cardiac Clearance, ekg, labs and office notes faxed to pre-admit testing at this time.

## 2021-06-05 NOTE — Consult Note (Signed)
Perioperative Services  Pre-Admission/Anesthesia Testing Clinical Consult  Date: 06/08/21  Patient Demographics:  Name: Stanley Marks DOB:   [birth year] MRN:   [id number]  Planned Surgical Procedure(s):    Case: [id number] Date/Time: 06/09/21 0956   Procedure: XI ROBOTIC ASSISTED LAPAROSCOPIC CHOLECYSTECTOMY - Provider requesting 3 hours / 180 minutes for procedure.   Anesthesia type: General   Pre-op diagnosis: cholecystitis, drain in place   Location: [facility]   Surgeons: Fredirick Maudlin, MD     NOTE: Available PAT nursing documentation and vital signs have been reviewed. Clinical nursing staff has updated patient's PMH/PSHx, current medication list, and drug allergies/intolerances to ensure comprehensive history available to assist in medical decision making as it pertains to the aforementioned surgical procedure and anticipated anesthetic course. Extensive review of available clinical information performed. [place] PMH and PSHx updated with any diagnoses/procedures that  may have been inadvertently omitted during his intake with the pre-admission testing department's nursing staff.  Clinical Discussion:  Stanley Marks is a 80 y.o. male who is submitted for pre-surgical anesthesia review and clearance prior to him undergoing the above procedure. Patient is a Current Smoker (64 pack years). Pertinent PMH includes: CAD, atrial fibrillation, cardiomegaly, CHF, infrarenal AAA, aortic atherosclerosis, PVD, HTN, HLD, CKD, OSAH (requires nocturnal PAP therapy), COPD, obesity (last documented BMI 38.26 kg/m).  This patient initially presented for further evaluation of his acute abdominal pain back in 07/2020.  Evaluation at that time revealed cholelithiasis without evidence of cholecystitis.  Patient was treated with a course of oral antibiotics and discharged home with plans for outpatient follow-up.  He presented back to the ED 3 days later with  complaints of weakness leading to a mechanical fall at home. This was seemingly precipitated by nausea, vomiting, diarrhea in the setting of already decreased oral intake.  Patient found to have an elevated WBC of 20.7 K/uL and clinical signs of dehydration (creatinine 1.31 mg/dL).  Patient was admitted to [facility] for ongoing management.  During admission, general surgery deemed to be a poor surgical candidate due to extensive comorbidities.  Patient ultimately underwent percutaneous cholecystostomy tube placement, which remains in place; last exchanged on 05/05/2021.  Patient with follow-up appointment with general surgeon Celine Ahr, MD) on 05/18/2021.  At the time of his office visit, patient still felt to be a poor surgical candidate.  With that being said, patient's primary cardiologist Humphrey Rolls, MD) has provided a statement of medical clearance deemed patient at low risk for planned surgical intervention.  Patient stating that he understands the risk associated with the procedure, however he wants the gallbladder removed.  Cholecystostomy tube and drainage bag are affecting patient's quality of life.  Additionally, there are dignity issues to consider as well as patient notes that tube and drainage bag are "embarrassing".  Given the aforementioned, general surgery has agreed to proceed with robot-assisted cholecystectomy, however given patient's multiple comorbidities, they have asked for general anesthesia consult prior to the procedure.  It is noted the patient is followed by cardiology Neoma Laming, MD).  Patient was last seen in the cardiology clinic on 05/16/2021; notes reviewed.  At the time of his clinic visit, patient doing well overall from a cardiovascular perspective.  He denied any chest pain, however had chronic shortness of breath related to his underlying COPD and HFrEF diagnoses.  He denied any PND, orthopnea, palpitations, significant peripheral edema, vertiginous  symptoms, or presyncope/syncope.  Patient with a PMH significant for cardiovascular diagnoses,  including CAD, HFrEF, and atrial fibrillation.   Patient underwent DCCV procedure on 11/20/2016 which resulted in a postprocedural sinus bradycardia.   CT imaging of the chest performed on 03/05/2019 noted an incidental finding of a 3.6 cm infrarenal abdominal aortic aneurysm with no evidence of dissection.  Last TTE performed on 05/12/2021 revealed reduced left ventricular systolic function with an EF of 44%, mild LVH, biatrial enlargement, and evidence of mild valvular insufficiency.  Subsequent myocardial perfusion imaging study revealed no evidence of stress-induced myocardial ischemia or arrhythmia; LVEF 52% (see full interpretation of cardiovascular testing below).  CHA2DS2-VASc Score = 5 (age x 2, CHF, HTN, vascular disease).  Patient chronically anticoagulated using daily rivaroxaban; compliant with therapy with no evidence of GI bleeding.  Rate and rhythm currently controlled on amiodarone and metoprolol.Patient on GDMT for his HTN and HLD diagnoses.  HFrEF symptoms and blood pressure moderately controlled on currently prescribed beta-blocker, diuretic, and ARB/ANRI therapies; blood pressure 140/96.  Patient is on a statin for his HLD. Functional capacity, as defined by DASI, is documented as being >/= 4 METS.  No changes were made to patient's medication regimen.  Patient to follow-up with outpatient cardiology in 6 months or sooner if needed.  Patient is scheduled for the aforementioned procedure on 06/09/2021 with Dr. Fredirick Maudlin. Given patient's past medical history significant for cardiovascular diagnoses and multiple other comorbidities, presurgical cardiac clearance was sought by the performing surgeon's office and PAT team. Additionally, I reached out for input/recommendations/clearance from PCP Lamonte Sakai, MD), however despite multiple attempts, no return communication was received.    Per cardiology, "this patient is optimized for surgery and may proceed with the planned procedural course with overall LOW risk of significant perioperative cardiovascular complications".  Again, this patient is on daily anticoagulation therapy.  He has been instructed on recommendations from his cardiologist for holding his rivaroxaban for 3 days prior to his procedure with plans to restart as soon as postoperatively respectively minimized by his primary attending surgeon.  Cardiology indicates that patient will not require enoxaparin bridging prior to his procedure.  The patient is aware that his last dose of rivaroxaban should be on 06/05/2021.  Patient denies previous perioperative complications with anesthesia in the past. In review of the available records, it is noted that patient underwent a general anesthetic course here (ASA III) in 11/2016 without documented complications.   Vitals with BMI 06/02/2021 05/18/2021 05/05/2021  Height 6\' 1"  6\' 1"  -  Weight 290 lbs 294 lbs 13 oz -  BMI 12.75 17.0 -  Systolic - 017 494  Diastolic - 72 94  Pulse - 73 93    Providers/Specialists:   NOTE: Primary physician provider listed below. Patient may have been seen by APP or partner within same practice.   PROVIDER ROLE / SPECIALTY LAST Lucy Antigua, MD  General Surgery 05/18/2021  Perrin Maltese, MD  Primary Care Provider ???  Neoma Laming, MD  Cardiology 05/16/2021   Allergies:  Atorvastatin and Lipitor [atorvastatin calcium]  Current Home Medications:   No current facility-administered medications for this encounter.    ADVAIR HFA 230-21 MCG/ACT inhaler   amiodarone (PACERONE) 200 MG tablet   metoprolol succinate (TOPROL-XL) 50 MG 24 hr tablet   ondansetron (ZOFRAN ODT) 4 MG disintegrating tablet   OXYGEN   Rivaroxaban (XARELTO) 15 MG TABS tablet   sacubitril-valsartan (ENTRESTO) 49-51 MG   spironolactone (ALDACTONE) 25 MG tablet   torsemide (DEMADEX) 20 MG tablet    History:  Past Medical History:  Diagnosis Date   Aneurysm of infrarenal abdominal aorta (HCC)    a.) measured 3.6 cm on 03/05/2019 CT; b.) measured 3.2 cm on 07/21/2020 MRI   Aortic atherosclerosis (HCC)    Atrial fibrillation ([place])    on Xarelto   Basal cell carcinoma 08/24/2014   chin, L nose ant alar crease   Bilateral renal cysts    CAD (coronary artery disease)    Cardiomegaly    CHF (congestive heart failure) (HCC)    Chronic anticoagulation    Rivaroxaban   CKD (chronic kidney disease)    COPD (chronic obstructive pulmonary disease) ([place])    Diverticulosis    Emphysema of lung (HCC)    Hepatic steatosis    History of 2019 novel coronavirus disease (COVID-19) 12/21/2019   Repeat testing on 01/22/2020 remained (+)   HLD (hyperlipidemia)    Hypertension    Obesity (BMI 30-39.9)    Obstructive sleep apnea    Pneumonia due to COVID-19 virus 01/22/2020   PVD (peripheral vascular disease) ([place])    Squamous cell carcinoma of skin 09/07/2014, 03/23/2020   L dorsal hand   Squamous cell carcinoma of skin 01/17/2015   R prox lateral dorsum of hand   Past Surgical History:  Procedure Laterality Date   APPENDECTOMY     arm surgery     ELECTROPHYSIOLOGIC STUDY N/A 11/20/2016   Procedure: CARDIOVERSION;  Surgeon: Dionisio David, MD;  Location: [facility] ORS;  Service: Cardiovascular;  Laterality: N/A;   IR CATHETER TUBE CHANGE  05/05/2021   IR CHOLANGIOGRAM EXISTING TUBE  08/18/2020   IR PERC CHOLECYSTOSTOMY  07/22/2020   IR REMOVAL BILIARY DRAIN  01/20/2021   Family History  Problem Relation Age of Onset   Heart disease Mother    Heart attack Father    Social History   Tobacco Use   Smoking status: Every Day    Packs/day: 1.00    Years: 64.00    Pack years: 64.00    Types: Cigarettes    Start date: 12/10/1956   Smokeless tobacco: Never  Vaping Use   Vaping Use: Never used  Substance Use Topics   Alcohol use: Not Currently    Comment: quit 1979; used to drink quart a  day   Drug use: Never    Pertinent Clinical Results:  LABS: Labs reviewed: Acceptable for surgery.  Hospital Outpatient Visit on 06/07/2021  Component Date Value Ref Range Status   Sodium 06/07/2021 140  135 - 145 mmol/L Final   Potassium 06/07/2021 3.6  3.5 - 5.1 mmol/L Final   Chloride 06/07/2021 104  98 - 111 mmol/L Final   CO2 06/07/2021 28  22 - 32 mmol/L Final   Glucose, Bld 06/07/2021 92  70 - 99 mg/dL Final   Glucose reference range applies only to samples taken after fasting for at least 8 hours.   BUN 06/07/2021 14  8 - 23 mg/dL Final   Creatinine, Ser 06/07/2021 1.19  0.61 - 1.24 mg/dL Final   Calcium 06/07/2021 9.1  8.9 - 10.3 mg/dL Final   Total Protein 06/07/2021 7.3  6.5 - 8.1 g/dL Final   Albumin 06/07/2021 3.6  3.5 - 5.0 g/dL Final   AST 06/07/2021 23  15 - 41 U/L Final   ALT 06/07/2021 37  0 - 44 U/L Final   Alkaline Phosphatase 06/07/2021 91  38 - 126 U/L Final   Total Bilirubin 06/07/2021 1.1  0.3 - 1.2 mg/dL Final   GFR, Estimated 06/07/2021 >  60  >60 mL/min Final   Comment: (NOTE) Calculated using the CKD-EPI Creatinine Equation (2021)    Anion gap 06/07/2021 8  5 - 15 Final   Performed at [facility], [address] [place], [place] [address]   Prothrombin Time 06/07/2021 14.5  11.4 - 15.2 seconds Final   INR 06/07/2021 1.1  0.8 - 1.2 Final   Comment: (NOTE) INR goal varies based on device and disease states. Performed at [facility], [address] [place], [place] [address]    aPTT 06/07/2021 34  24 - 36 seconds Final   Performed at [facility], [address] [place], [place] [address]   WBC 06/07/2021 9.0  4.0 - 10.5 K/uL Final   RBC 06/07/2021 5.41  4.22 - 5.81 MIL/uL Final   Hemoglobin 06/07/2021 16.4  13.0 - 17.0 g/dL Final   HCT 06/07/2021 49.6  39.0 - 52.0 % Final   MCV 06/07/2021 91.7  80.0 - 100.0 fL Final   MCH 06/07/2021 30.3  26.0 - 34.0 pg Final   MCHC 06/07/2021 33.1  30.0 - 36.0 g/dL Final   RDW  06/07/2021 15.4  11.5 - 15.5 % Final   Platelets 06/07/2021 169  150 - 400 K/uL Final   nRBC 06/07/2021 0.0  0.0 - 0.2 % Final   Performed at [facility], [address] [place], [place] [address]   SARS Coronavirus 2 06/07/2021 NEGATIVE  NEGATIVE Final   Comment: (NOTE) SARS-CoV-2 target nucleic acids are NOT DETECTED.  The SARS-CoV-2 RNA is generally detectable in upper and lower respiratory specimens during the acute phase of infection. Negative results do not preclude SARS-CoV-2 infection, do not rule out co-infections with other pathogens, and should not be used as the sole basis for treatment or other patient management decisions. Negative results must be combined with clinical observations, patient history, and epidemiological information. The expected result is Negative.  Fact Sheet for Patients: SugarRoll.be  Fact Sheet for Healthcare Providers: https://www.woods-mathews.com/  This test is not yet approved or cleared by the [place] FDA and  has been authorized for detection and/or diagnosis of SARS-CoV-2 by FDA under an Emergency Use Authorization (EUA). This EUA will remain  in effect (meaning this test can be used) for the duration of the COVID-19 declaration under Se                          ction 564(b)(1) of the Act, 21 U.S.C. section 360bbb-3(b)(1), unless the authorization is terminated or revoked sooner.  Performed at [facility], [place] [address], [place],  [address]     ECG: Date: 06/07/2021 Time ECG obtained: 0827 AM Rate: 96 bpm Rhythm: atrial fibrillation Axis (leads I and aVF): Normal Intervals: QRS 92 ms. QTc 485 ms. ST segment and T wave changes: Nonspecific anterolateral ST abnormality; prolonged QTC  Comparison: Similar to previous tracing obtained on 07/20/2020   IMAGING / PROCEDURES: LEXISCAN performed on 05/12/2021 LVEF 52% Nonspecific ST-T wave changes; no acute  changes with adenosine. No evidence of perfusion defects Mildly dilated left ventricle Mild anteroseptal wall hypokinesis Normal stress test with borderline left ventricular dysfunction  TRANSTHORACIC ECHOCARDIOGRAM performed on 05/12/2021 Technically difficult study due to body habitus LVEF 44% left ventricular cavity size normal Left ventricular diastolic dysfunction; Z3GU relaxation abnormality Mild LVH Moderately dilated right ventricle with normal function Normal right ventricular wall motion Severely dilated left atrium Mild to moderate dilation of the right atrium  Mild calcification of the aortic valve cusp with no stenosis or regurgitation No pulmonic valve stenosis or regurgitation Mild mitral valve regurgitation Trace tricuspid valve regurgitation Trivial pericardial effusion  CTA CHEST/ABDOMEN/PELVIS performed on 03/05/2019 No aortic dissection or acute aortic abnormality.  No acute abnormality or explanation for chest pain.  Infrarenal aortic aneurysm with maximum diameter 3.6 cm.  Recommend follow-up ultrasound in 2 years. Diffuse aortic atherosclerosis with calcified and irregular noncalcified plaque Cardiomegaly Coronary artery calcifications Mild emphysema incidental findings in the abdomen pelvis include hepatic steatosis, multiple bilateral renal cysts, and colonic diverticulosis without diverticulitis  Impression and Plan:  Stanley Marks has been referred for pre-anesthesia review and clearance prior to him undergoing the planned anesthetic and procedural courses. Available labs, pertinent testing, and imaging results were personally reviewed by me. This patient has been appropriately cleared by cardiology with an overall LOW risk of significant perioperative cardiovascular complications.  Based on clinical review performed today (06/08/21), barring any significant acute changes in the patient's overall condition, it is anticipated that he will be able to proceed with  the planned surgical intervention. Given his overall clinical status, patient is at an increased risk for the planned procedure. Risks, as well as alternatives, have been discussed at length with patient by his surgeon and he wishes to proceed. Note, any acute changes in clinical condition may necessitate his procedure being postponed and/or cancelled. Patient will meet with anesthesia team (MD and/or CRNA) on the day of his procedure for preoperative evaluation/assessment. Questions regarding anesthetic course will be fielded at that time.   Pre-surgical instructions were reviewed with the patient during his PAT appointment and questions were fielded by PAT clinical staff. Patient was advised that if any questions or concerns arise prior to his procedure then he should return a call to PAT and/or his surgeon's office to discuss.  Honor Loh, MSN, APRN, FNP-C, CEN [facility]  Peri-operative Services Nurse Practitioner Phone: [phone] Fax: [phone] 06/08/21 3:42 PM  NOTE: This note has been prepared using Dragon dictation software. Despite my best ability to proofread, there is always the potential that unintentional transcriptional errors may still occur from this process.

## 2021-06-07 ENCOUNTER — Other Ambulatory Visit: Payer: Medicare Other

## 2021-06-07 ENCOUNTER — Encounter: Payer: Self-pay | Admitting: General Surgery

## 2021-06-07 ENCOUNTER — Encounter
Admission: RE | Admit: 2021-06-07 | Discharge: 2021-06-07 | Disposition: A | Discharge status: Home / Self Care | Payer: Medicare Other | Source: Ambulatory Visit | Attending: General Surgery | Admitting: General Surgery

## 2021-06-07 ENCOUNTER — Other Ambulatory Visit: Payer: Self-pay

## 2021-06-07 DIAGNOSIS — Z20822 Contact with and (suspected) exposure to covid-19: Secondary | ICD-10-CM | POA: Insufficient documentation

## 2021-06-07 DIAGNOSIS — Z01818 Encounter for other preprocedural examination: Secondary | ICD-10-CM | POA: Insufficient documentation

## 2021-06-07 DIAGNOSIS — Z01810 Encounter for preprocedural cardiovascular examination: Secondary | ICD-10-CM

## 2021-06-07 LAB — CBC
HCT: 49.6 % (ref 39.0–52.0)
Hemoglobin: 16.4 g/dL (ref 13.0–17.0)
MCH: 30.3 pg (ref 26.0–34.0)
MCHC: 33.1 g/dL (ref 30.0–36.0)
MCV: 91.7 fL (ref 80.0–100.0)
Platelets: 169 10*3/uL (ref 150–400)
RBC: 5.41 MIL/uL (ref 4.22–5.81)
RDW: 15.4 % (ref 11.5–15.5)
WBC: 9 10*3/uL (ref 4.0–10.5)
nRBC: 0 % (ref 0.0–0.2)

## 2021-06-07 LAB — COMPREHENSIVE METABOLIC PANEL
ALT: 37 U/L (ref 0–44)
AST: 23 U/L (ref 15–41)
Albumin: 3.6 g/dL (ref 3.5–5.0)
Alkaline Phosphatase: 91 U/L (ref 38–126)
Anion gap: 8 (ref 5–15)
BUN: 14 mg/dL (ref 8–23)
CO2: 28 mmol/L (ref 22–32)
Calcium: 9.1 mg/dL (ref 8.9–10.3)
Chloride: 104 mmol/L (ref 98–111)
Creatinine, Ser: 1.19 mg/dL (ref 0.61–1.24)
GFR, Estimated: >60 mL/min (ref >60)
Glucose, Bld: 92 mg/dL (ref 70–99)
Potassium: 3.6 mmol/L (ref 3.5–5.1)
Sodium: 140 mmol/L (ref 135–145)
Total Bilirubin: 1.1 mg/dL (ref 0.3–1.2)
Total Protein: 7.3 g/dL (ref 6.5–8.1)

## 2021-06-07 LAB — APTT: aPTT: 34 seconds (ref 24–36)

## 2021-06-07 LAB — PROTIME-INR
INR: 1.1 (ref 0.8–1.2)
Prothrombin Time: 14.5 seconds (ref 11.4–15.2)

## 2021-06-07 LAB — SARS CORONAVIRUS 2 (TAT 6-24 HRS): SARS Coronavirus 2: NEGATIVE

## 2021-06-09 ENCOUNTER — Encounter
Admission: RE | Disposition: A | Discharge status: Home / Self Care | Payer: Self-pay | Source: Home / Self Care | Attending: General Surgery

## 2021-06-09 ENCOUNTER — Ambulatory Visit: Payer: Medicare Other | Admitting: Urgent Care

## 2021-06-09 ENCOUNTER — Ambulatory Visit
Admission: RE | Admit: 2021-06-09 | Discharge: 2021-06-09 | Disposition: A | Discharge status: Home / Self Care | Payer: Medicare Other | Attending: General Surgery | Admitting: General Surgery

## 2021-06-09 ENCOUNTER — Encounter: Payer: Self-pay | Admitting: General Surgery

## 2021-06-09 ENCOUNTER — Other Ambulatory Visit: Payer: Self-pay

## 2021-06-09 DIAGNOSIS — I130 Hypertensive heart and chronic kidney disease with heart failure and stage 1 through stage 4 chronic kidney disease, or unspecified chronic kidney disease: Secondary | ICD-10-CM | POA: Diagnosis not present

## 2021-06-09 DIAGNOSIS — Z888 Allergy status to other drugs, medicaments and biological substances status: Secondary | ICD-10-CM | POA: Insufficient documentation

## 2021-06-09 DIAGNOSIS — K8000 Calculus of gallbladder with acute cholecystitis without obstruction: Secondary | ICD-10-CM

## 2021-06-09 DIAGNOSIS — I5020 Unspecified systolic (congestive) heart failure: Secondary | ICD-10-CM | POA: Diagnosis not present

## 2021-06-09 DIAGNOSIS — F17210 Nicotine dependence, cigarettes, uncomplicated: Secondary | ICD-10-CM | POA: Diagnosis not present

## 2021-06-09 DIAGNOSIS — I4891 Unspecified atrial fibrillation: Secondary | ICD-10-CM | POA: Diagnosis not present

## 2021-06-09 DIAGNOSIS — Z6838 Body mass index (BMI) 38.0-38.9, adult: Secondary | ICD-10-CM | POA: Diagnosis not present

## 2021-06-09 DIAGNOSIS — J439 Emphysema, unspecified: Secondary | ICD-10-CM | POA: Diagnosis not present

## 2021-06-09 DIAGNOSIS — K8010 Calculus of gallbladder with chronic cholecystitis without obstruction: Secondary | ICD-10-CM | POA: Diagnosis present

## 2021-06-09 DIAGNOSIS — Z8616 Personal history of COVID-19: Secondary | ICD-10-CM | POA: Insufficient documentation

## 2021-06-09 DIAGNOSIS — Z79899 Other long term (current) drug therapy: Secondary | ICD-10-CM | POA: Insufficient documentation

## 2021-06-09 DIAGNOSIS — W19XXXA Unspecified fall, initial encounter: Secondary | ICD-10-CM | POA: Insufficient documentation

## 2021-06-09 DIAGNOSIS — I2510 Atherosclerotic heart disease of native coronary artery without angina pectoris: Secondary | ICD-10-CM | POA: Insufficient documentation

## 2021-06-09 DIAGNOSIS — Z7901 Long term (current) use of anticoagulants: Secondary | ICD-10-CM | POA: Insufficient documentation

## 2021-06-09 DIAGNOSIS — N189 Chronic kidney disease, unspecified: Secondary | ICD-10-CM | POA: Diagnosis not present

## 2021-06-09 DIAGNOSIS — E669 Obesity, unspecified: Secondary | ICD-10-CM | POA: Insufficient documentation

## 2021-06-09 HISTORY — DX: Chronic obstructive pulmonary disease, unspecified: J44.9

## 2021-06-09 HISTORY — DX: Infrarenal abdominal aortic aneurysm, without rupture: I71.43

## 2021-06-09 HISTORY — DX: Hyperlipidemia, unspecified: E78.5

## 2021-06-09 HISTORY — DX: Atherosclerosis of aorta: I70.0

## 2021-06-09 HISTORY — DX: Cardiomegaly: I51.7

## 2021-06-09 HISTORY — DX: Atherosclerotic heart disease of native coronary artery without angina pectoris: I25.10

## 2021-06-09 HISTORY — DX: Chronic kidney disease, unspecified: N18.9

## 2021-06-09 HISTORY — DX: Fatty (change of) liver, not elsewhere classified: K76.0

## 2021-06-09 HISTORY — DX: Abdominal aortic aneurysm, without rupture: I71.4

## 2021-06-09 HISTORY — DX: Diverticulosis of intestine, part unspecified, without perforation or abscess without bleeding: K57.90

## 2021-06-09 HISTORY — DX: Long term (current) use of anticoagulants: Z79.01

## 2021-06-09 HISTORY — DX: Cyst of kidney, acquired: N28.1

## 2021-06-09 SURGERY — CHOLECYSTECTOMY, ROBOT-ASSISTED, LAPAROSCOPIC
Anesthesia: General

## 2021-06-09 MED ORDER — METOPROLOL TARTRATE 5 MG/5ML IV SOLN
INTRAVENOUS | Status: AC
  Filled 2021-06-09: qty 5

## 2021-06-09 MED ORDER — METOPROLOL TARTRATE 5 MG/5ML IV SOLN
INTRAVENOUS | Status: DC | PRN
  Administered 2021-06-09: 2.5 mg via INTRAVENOUS

## 2021-06-09 MED ORDER — SODIUM CHLORIDE 0.9 % IV SOLN
2.0000 g | INTRAVENOUS | Status: AC
  Administered 2021-06-09: 2 g via INTRAVENOUS

## 2021-06-09 MED ORDER — CHLORHEXIDINE GLUCONATE 0.12 % MT SOLN: 15.0000 mL | Freq: Once | OROMUCOSAL | Status: AC

## 2021-06-09 MED ORDER — 0.9 % SODIUM CHLORIDE (POUR BTL) OPTIME
TOPICAL | Status: DC | PRN
  Administered 2021-06-09: 200 mL

## 2021-06-09 MED ORDER — CHLORHEXIDINE GLUCONATE CLOTH 2 % EX PADS: 6.0000 | MEDICATED_PAD | Freq: Once | CUTANEOUS | Status: DC

## 2021-06-09 MED ORDER — FAMOTIDINE 20 MG PO TABS
ORAL_TABLET | ORAL | Status: AC
  Administered 2021-06-09: 20 mg via ORAL
  Filled 2021-06-09: qty 1

## 2021-06-09 MED ORDER — LIDOCAINE HCL (PF) 2 % IJ SOLN
INTRAMUSCULAR | Status: AC
  Filled 2021-06-09: qty 5

## 2021-06-09 MED ORDER — OXYCODONE HCL 5 MG PO TABS
ORAL_TABLET | ORAL | Status: AC
  Filled 2021-06-09: qty 1

## 2021-06-09 MED ORDER — FENTANYL CITRATE (PF) 100 MCG/2ML IJ SOLN
INTRAMUSCULAR | Status: AC
  Filled 2021-06-09: qty 2

## 2021-06-09 MED ORDER — ORAL CARE MOUTH RINSE: 15.0000 mL | Freq: Once | OROMUCOSAL | Status: AC

## 2021-06-09 MED ORDER — OXYCODONE HCL 5 MG PO TABS: 5.0000 mg | ORAL_TABLET | Freq: Four times a day (QID) | ORAL | 0 refills | Status: DC | PRN

## 2021-06-09 MED ORDER — CHLORHEXIDINE GLUCONATE CLOTH 2 % EX PADS
6.0000 | MEDICATED_PAD | Freq: Once | CUTANEOUS | Status: AC
  Administered 2021-06-09: 6 via TOPICAL

## 2021-06-09 MED ORDER — ROCURONIUM BROMIDE 10 MG/ML (PF) SYRINGE
PREFILLED_SYRINGE | INTRAVENOUS | Status: AC
  Filled 2021-06-09: qty 10

## 2021-06-09 MED ORDER — SODIUM CHLORIDE 0.9 % IV SOLN
INTRAVENOUS | Status: AC
  Filled 2021-06-09: qty 2

## 2021-06-09 MED ORDER — DEXAMETHASONE SODIUM PHOSPHATE 10 MG/ML IJ SOLN
INTRAMUSCULAR | Status: AC
  Filled 2021-06-09: qty 1

## 2021-06-09 MED ORDER — LIDOCAINE-EPINEPHRINE 1 %-1:100000 IJ SOLN
INTRAMUSCULAR | Status: DC | PRN
  Administered 2021-06-09: 19 mL

## 2021-06-09 MED ORDER — LIDOCAINE-EPINEPHRINE 1 %-1:100000 IJ SOLN
INTRAMUSCULAR | Status: AC
  Filled 2021-06-09: qty 1

## 2021-06-09 MED ORDER — ONDANSETRON HCL 4 MG/2ML IJ SOLN
INTRAMUSCULAR | Status: AC
  Filled 2021-06-09: qty 2

## 2021-06-09 MED ORDER — SODIUM CHLORIDE 0.9 % IV SOLN: 2.0000 g | INTRAVENOUS | Status: DC

## 2021-06-09 MED ORDER — FENTANYL CITRATE (PF) 100 MCG/2ML IJ SOLN
INTRAMUSCULAR | Status: AC
  Administered 2021-06-09: 25 ug via INTRAVENOUS
  Filled 2021-06-09: qty 2

## 2021-06-09 MED ORDER — PHENYLEPHRINE HCL (PRESSORS) 10 MG/ML IV SOLN
INTRAVENOUS | Status: DC | PRN
  Administered 2021-06-09 (×3): 100 ug via INTRAVENOUS

## 2021-06-09 MED ORDER — MIDAZOLAM HCL 2 MG/2ML IJ SOLN
INTRAMUSCULAR | Status: DC | PRN
  Administered 2021-06-09: 2 mg via INTRAVENOUS

## 2021-06-09 MED ORDER — SUGAMMADEX SODIUM 200 MG/2ML IV SOLN
INTRAVENOUS | Status: DC | PRN
  Administered 2021-06-09: 200 mg via INTRAVENOUS

## 2021-06-09 MED ORDER — LACTATED RINGERS IV SOLN: INTRAVENOUS | Status: DC

## 2021-06-09 MED ORDER — PROPOFOL 10 MG/ML IV BOLUS
INTRAVENOUS | Status: AC
  Filled 2021-06-09: qty 20

## 2021-06-09 MED ORDER — MIDAZOLAM HCL 2 MG/2ML IJ SOLN
INTRAMUSCULAR | Status: AC
  Filled 2021-06-09: qty 2

## 2021-06-09 MED ORDER — FENTANYL CITRATE (PF) 100 MCG/2ML IJ SOLN
INTRAMUSCULAR | Status: DC | PRN
  Administered 2021-06-09 (×2): 50 ug via INTRAVENOUS

## 2021-06-09 MED ORDER — IPRATROPIUM-ALBUTEROL 0.5-2.5 (3) MG/3ML IN SOLN
3.0000 mL | RESPIRATORY_TRACT | Status: AC
  Administered 2021-06-09: 3 mL via RESPIRATORY_TRACT

## 2021-06-09 MED ORDER — MEPERIDINE HCL 25 MG/ML IJ SOLN: 6.2500 mg | INTRAMUSCULAR | Status: DC | PRN

## 2021-06-09 MED ORDER — ONDANSETRON HCL 4 MG/2ML IJ SOLN
INTRAMUSCULAR | Status: DC | PRN
  Administered 2021-06-09: 4 mg via INTRAVENOUS

## 2021-06-09 MED ORDER — DEXAMETHASONE SODIUM PHOSPHATE 10 MG/ML IJ SOLN
INTRAMUSCULAR | Status: DC | PRN
  Administered 2021-06-09: 10 mg via INTRAVENOUS

## 2021-06-09 MED ORDER — CHLORHEXIDINE GLUCONATE 0.12 % MT SOLN
OROMUCOSAL | Status: AC
  Administered 2021-06-09: 15 mL via OROMUCOSAL
  Filled 2021-06-09: qty 15

## 2021-06-09 MED ORDER — OXYCODONE HCL 5 MG PO TABS
5.0000 mg | ORAL_TABLET | Freq: Once | ORAL | Status: AC | PRN
Start: 2021-06-09 — End: 2021-06-09
  Administered 2021-06-09: 5 mg via ORAL

## 2021-06-09 MED ORDER — BUPIVACAINE HCL (PF) 0.25 % IJ SOLN
INTRAMUSCULAR | Status: AC
  Filled 2021-06-09: qty 30

## 2021-06-09 MED ORDER — ACETAMINOPHEN 500 MG PO TABS: 1000.0000 mg | ORAL_TABLET | ORAL | Status: AC

## 2021-06-09 MED ORDER — SODIUM CHLORIDE 0.9 % IR SOLN
Status: DC | PRN
  Administered 2021-06-09: 200 mL

## 2021-06-09 MED ORDER — IBUPROFEN 800 MG PO TABS: 800.0000 mg | ORAL_TABLET | Freq: Three times a day (TID) | ORAL | 0 refills | Status: AC | PRN

## 2021-06-09 MED ORDER — ACETAMINOPHEN 500 MG PO TABS: 1000.0000 mg | ORAL_TABLET | Freq: Four times a day (QID) | ORAL | 0 refills | Status: AC | PRN

## 2021-06-09 MED ORDER — FENTANYL CITRATE (PF) 100 MCG/2ML IJ SOLN
25.0000 ug | INTRAMUSCULAR | Status: DC | PRN
  Administered 2021-06-09 (×3): 25 ug via INTRAVENOUS

## 2021-06-09 MED ORDER — ROCURONIUM BROMIDE 100 MG/10ML IV SOLN
INTRAVENOUS | Status: DC | PRN
  Administered 2021-06-09: 20 mg via INTRAVENOUS
  Administered 2021-06-09: 60 mg via INTRAVENOUS

## 2021-06-09 MED ORDER — INDOCYANINE GREEN 25 MG IV SOLR
7.5000 mg | Freq: Once | INTRAVENOUS | Status: AC
  Administered 2021-06-09: 7.5 mg via INTRAVENOUS
  Filled 2021-06-09: qty 10
  Filled 2021-06-09: qty 3

## 2021-06-09 MED ORDER — FAMOTIDINE 20 MG PO TABS: 20.0000 mg | ORAL_TABLET | Freq: Once | ORAL | Status: AC

## 2021-06-09 MED ORDER — PROPOFOL 10 MG/ML IV BOLUS
INTRAVENOUS | Status: DC | PRN
  Administered 2021-06-09: 150 mg via INTRAVENOUS
  Administered 2021-06-09: 50 mg via INTRAVENOUS

## 2021-06-09 MED ORDER — OXYCODONE HCL 5 MG/5ML PO SOLN
5.0000 mg | Freq: Once | ORAL | Status: AC | PRN
Start: 2021-06-09 — End: 2021-06-09

## 2021-06-09 MED ORDER — PROMETHAZINE HCL 25 MG/ML IJ SOLN: 6.2500 mg | INTRAMUSCULAR | Status: DC | PRN

## 2021-06-09 MED ORDER — IPRATROPIUM-ALBUTEROL 0.5-2.5 (3) MG/3ML IN SOLN
RESPIRATORY_TRACT | Status: AC
  Filled 2021-06-09: qty 3

## 2021-06-09 MED ORDER — LIDOCAINE HCL (CARDIAC) PF 100 MG/5ML IV SOSY
PREFILLED_SYRINGE | INTRAVENOUS | Status: DC | PRN
  Administered 2021-06-09: 100 mg via INTRAVENOUS

## 2021-06-09 MED ORDER — ACETAMINOPHEN 500 MG PO TABS
ORAL_TABLET | ORAL | Status: AC
  Administered 2021-06-09: 1000 mg via ORAL
  Filled 2021-06-09: qty 2

## 2021-06-09 SURGICAL SUPPLY — 52 items
BAG INFUSER PRESSURE 100CC (MISCELLANEOUS) ×2 IMPLANT
BLADE SURG SZ11 CARB STEEL (BLADE) ×2 IMPLANT
CANISTER SUCT 1200ML W/VALVE (MISCELLANEOUS) ×2 IMPLANT
CANNULA REDUC XI 12-8 STAPL (CANNULA) ×1
CANNULA REDUCER 12-8 DVNC XI (CANNULA) ×1 IMPLANT
CHLORAPREP W/TINT 26 (MISCELLANEOUS) ×2 IMPLANT
CLIP VESOLOCK MED LG 6/CT (CLIP) ×4 IMPLANT
COVER TIP SHEARS 8 DVNC (MISCELLANEOUS) ×1 IMPLANT
COVER TIP SHEARS 8MM DA VINCI (MISCELLANEOUS) ×1
DECANTER SPIKE VIAL GLASS SM (MISCELLANEOUS) ×4 IMPLANT
DEFOGGER SCOPE WARMER CLEARIFY (MISCELLANEOUS) ×2 IMPLANT
DERMABOND ADVANCED (GAUZE/BANDAGES/DRESSINGS) ×1
DERMABOND ADVANCED .7 DNX12 (GAUZE/BANDAGES/DRESSINGS) ×1 IMPLANT
DRAPE ARM DVNC X/XI (DISPOSABLE) ×4 IMPLANT
DRAPE COLUMN DVNC XI (DISPOSABLE) ×1 IMPLANT
DRAPE DA VINCI XI ARM (DISPOSABLE) ×4
DRAPE DA VINCI XI COLUMN (DISPOSABLE) ×1
ELECT CAUTERY BLADE TIP 2.5 (TIP) ×2
ELECT REM PT RETURN 9FT ADLT (ELECTROSURGICAL) ×2
ELECTRODE CAUTERY BLDE TIP 2.5 (TIP) ×1 IMPLANT
ELECTRODE REM PT RTRN 9FT ADLT (ELECTROSURGICAL) ×1 IMPLANT
GAUZE 4X4 16PLY ~~LOC~~+RFID DBL (SPONGE) ×2 IMPLANT
GLOVE SURG SYN 6.5 ES PF (GLOVE) ×2 IMPLANT
GLOVE SURG UNDER LTX SZ7 (GLOVE) ×4 IMPLANT
GOWN STRL REUS W/ TWL LRG LVL3 (GOWN DISPOSABLE) ×3 IMPLANT
GOWN STRL REUS W/TWL LRG LVL3 (GOWN DISPOSABLE) ×3
IRRIGATOR SUCT 8 DISP DVNC XI (IRRIGATION / IRRIGATOR) ×1 IMPLANT
IRRIGATOR SUCTION 8MM XI DISP (IRRIGATION / IRRIGATOR) ×1
IV NS 1000ML (IV SOLUTION) ×1
IV NS 1000ML BAXH (IV SOLUTION) ×1 IMPLANT
KIT PINK PAD W/HEAD ARE REST (MISCELLANEOUS) ×2
KIT PINK PAD W/HEAD ARM REST (MISCELLANEOUS) ×1 IMPLANT
LABEL OR SOLS (LABEL) ×2 IMPLANT
MANIFOLD NEPTUNE II (INSTRUMENTS) ×2 IMPLANT
NEEDLE HYPO 22GX1.5 SAFETY (NEEDLE) ×2 IMPLANT
NS IRRIG 500ML POUR BTL (IV SOLUTION) ×2 IMPLANT
OBTURATOR OPTICAL STANDARD 8MM (TROCAR) ×1
OBTURATOR OPTICAL STND 8 DVNC (TROCAR) ×1
OBTURATOR OPTICALSTD 8 DVNC (TROCAR) ×1 IMPLANT
PACK LAP CHOLECYSTECTOMY (MISCELLANEOUS) ×2 IMPLANT
PENCIL ELECTRO HAND CTR (MISCELLANEOUS) ×2 IMPLANT
POUCH SPECIMEN RETRIEVAL 10MM (ENDOMECHANICALS) ×2 IMPLANT
SEAL CANN UNIV 5-8 DVNC XI (MISCELLANEOUS) ×3 IMPLANT
SEAL XI 5MM-8MM UNIVERSAL (MISCELLANEOUS) ×3
SET TUBE SMOKE EVAC HIGH FLOW (TUBING) ×2 IMPLANT
SOLUTION ELECTROLUBE (MISCELLANEOUS) ×2 IMPLANT
STAPLER CANNULA SEAL DVNC XI (STAPLE) ×1 IMPLANT
STAPLER CANNULA SEAL XI (STAPLE) ×1
STRIP CLOSURE SKIN 1/2X4 (GAUZE/BANDAGES/DRESSINGS) ×2 IMPLANT
SUT MNCRL 4-0 (SUTURE) ×1
SUT MNCRL 4-0 27XMFL (SUTURE) ×1
SUTURE MNCRL 4-0 27XMF (SUTURE) ×1 IMPLANT

## 2021-06-09 NOTE — Transfer of Care (Signed)
Immediate Anesthesia Transfer of Care Note  Patient: Stanley Marks  Procedure(s) Performed: XI ROBOTIC ASSISTED LAPAROSCOPIC CHOLECYSTECTOMY  Patient Location: PACU  Anesthesia Type:General  Level of Consciousness: alert  and pateint uncooperative  Airway & Oxygen Therapy: Patient Spontanous Breathing and Patient connected to face mask oxygen  Post-op Assessment: Report given to RN and Post -op Vital signs reviewed and stable  Post vital signs: Reviewed and stable  Last Vitals:  Vitals Value Taken Time  BP 151/100 06/09/21 1247  Temp    Pulse 87 06/09/21 1255  Resp 29 06/09/21 1253  SpO2 92 % 06/09/21 1255  Vitals shown include unvalidated device data.  Last Pain:  Vitals:   06/09/21 0752  TempSrc: Oral  PainSc: 0-No pain         Complications: No notable events documented.

## 2021-06-09 NOTE — Anesthesia Preprocedure Evaluation (Addendum)
Anesthesia Evaluation  Patient identified by MRN, date of birth, ID band Patient awake    Reviewed: Allergy & Precautions, NPO status , Patient's Chart, lab work & pertinent test results  History of Anesthesia Complications Negative for: history of anesthetic complications  Airway Mallampati: II  TM Distance: >3 FB Neck ROM: Full    Dental  (+) Poor Dentition, Missing   Pulmonary sleep apnea , COPD (occasional O2 use), Current Smoker and Patient abstained from smoking.,    breath sounds clear to auscultation- rhonchi (-) wheezing      Cardiovascular hypertension, Pt. on medications + CAD, + Past MI, + Peripheral Vascular Disease and +CHF  (-) Cardiac Stents and (-) CABG + dysrhythmias Atrial Fibrillation  Rhythm:Regular Rate:Normal - Systolic murmurs and - Diastolic murmurs Echo 0/27/74: 1. Left ventricular ejection fraction, by estimation, is 60 to 65%. The  left ventricle has normal function. The left ventricle has no regional  wall motion abnormalities. There is mild concentric left ventricular  hypertrophy. Left ventricular diastolic  parameters are indeterminate.  2. Right ventricular systolic function is normal. The right ventricular  size is normal.  3. The mitral valve is normal in structure and function. No evidence of  mitral valve regurgitation. No evidence of mitral stenosis.  4. The aortic valve is normal in structure and function. Aortic valve  regurgitation is not visualized. No aortic stenosis is present.  5. The inferior vena cava is normal in size with greater than 50%  respiratory variability, suggesting right atrial pressure of 3 mmHg.    Neuro/Psych neg Seizures negative neurological ROS  negative psych ROS   GI/Hepatic negative GI ROS, Neg liver ROS,   Endo/Other  negative endocrine ROSneg diabetes  Renal/GU negative Renal ROS     Musculoskeletal negative musculoskeletal ROS (+)    Abdominal (+) + obese,   Peds  Hematology negative hematology ROS (+)   Anesthesia Other Findings Past Medical History: No date: Aneurysm of infrarenal abdominal aorta (HCC)     Comment:  a.) measured 3.6 cm on 03/05/2019 CT; b.) measured 3.2               cm on 07/21/2020 MRI No date: Aortic atherosclerosis (HCC) No date: Atrial fibrillation [facility])     Comment:  on Xarelto 08/24/2014: Basal cell carcinoma     Comment:  chin, L nose ant alar crease No date: Bilateral renal cysts No date: CAD (coronary artery disease) No date: Cardiomegaly No date: CHF (congestive heart failure) (HCC) No date: Chronic anticoagulation     Comment:  Rivaroxaban No date: CKD (chronic kidney disease) No date: COPD (chronic obstructive pulmonary disease) (HCC) No date: Diverticulosis No date: Emphysema of lung ([place]) No date: Hepatic steatosis 12/21/2019: History of 2019 novel coronavirus disease (COVID-19)     Comment:  Repeat testing on 01/22/2020 remained (+) No date: HLD (hyperlipidemia) No date: Hypertension No date: Obesity (BMI 30-39.9) No date: Obstructive sleep apnea 01/22/2020: Pneumonia due to COVID-19 virus No date: PVD (peripheral vascular disease) ([place]) 09/07/2014, 03/23/2020: Squamous cell carcinoma of skin     Comment:  L dorsal hand 01/17/2015: Squamous cell carcinoma of skin     Comment:  R prox lateral dorsum of hand   Reproductive/Obstetrics                            Anesthesia Physical Anesthesia Plan  ASA: 3  Anesthesia Plan: General   Post-op Pain Management:  Induction: Intravenous  PONV Risk Score and Plan: 0 and Ondansetron  Airway Management Planned: Oral ETT  Additional Equipment:   Intra-op Plan:   Post-operative Plan: Extubation in OR  Informed Consent: I have reviewed the patients History and Physical, chart, labs and discussed the procedure including the risks, benefits and alternatives for the proposed anesthesia with  the patient or authorized representative who has indicated his/her understanding and acceptance.     Dental advisory given  Plan Discussed with: CRNA and Anesthesiologist  Anesthesia Plan Comments:         Anesthesia Quick Evaluation

## 2021-06-09 NOTE — Op Note (Signed)
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  Normal biliary anatomy.  Findings consistent with chronic cholecystitis.  Estimated Blood Loss: Less than 5 cc       Specimens: Gallbladder           Complications: none immediately apparent  Procedure In Detail: The patient was seen again in the holding room. The benefits, complications, treatment options, and expected outcomes were discussed with the patient. The risks of bleeding, infection, recurrence of symptoms, failure to resolve symptoms, bile duct damage, bile duct leak, retained common bile duct stone, bowel injury, any of which could require further surgery and/or ERCP, stent, or papillotomy were reviewed with the patient. The likelihood of improving the patient's symptoms with return to their baseline status is good.  The patient and/or family concurred with the proposed plan, giving informed consent.  The patient was taken to operating room, identified and the procedure verified as laparoscopic cholecystectomy.  A time out was held and the above information confirmed.  Prior to the induction of general anesthesia, antibiotic prophylaxis was administered. VTE prophylaxis was in place. General endotracheal anesthesia was then administered and tolerated well. After the induction, the percutaneous cholecystostomy tube was removed.  The abdomen was prepped with Chloraprep and draped in the usual sterile fashion. The patient was positioned in the supine position.  Optiview technique was used to enter the abdomen in the left upper quadrant at Palmer's point via a standard 5 mm laparoscopic trocar.  Pneumoperitoneum was then created with CO2 and tolerated well  without any adverse changes in the patient's vital signs.  A 12 mm robotic trocar along with three 8-mm robotic trochars were placed in the lower abdomen under direct vision.  All skin incisions were infiltrated with a local anesthetic agent before making the incision and placing the trocars.   The patient was positioned in 15 degrees of reverse Trendelenburg and tilted 10 degrees to the left.  The robot was brought to the surgical field and docked in the standard fashion.  We made sure all the instrumentation was kept in direct view at all times and that there were no collision between the arms.  I scrubbed out and went to the console.  The gallbladder was identified, the fundus grasped and retracted cephalad.  The scar creating the sinus tract was divided in order to facilitate this.  It was carefully cauterized with bipolar and monopolar electrocautery.  Adhesions were lysed bluntly and with electrocautery. The infundibulum was grasped and retracted laterally, exposing the peritoneum overlying the triangle of Calot. This was then divided and exposed in a blunt fashion. An extended critical view of the cystic duct and cystic artery was obtained.  The cystic duct was clearly identified and bluntly dissected away from the surrounding tissues, as was the cystic artery.  Using ICG cholangiography we visualized the cystic duct and confirmed that there was no aberrant biliary ductal anatomy nor any evidence of bile duct injury.  Both cystic duct and cystic artery were clipped and divided. The gallbladder was taken from the gallbladder fossa in a retrograde fashion with the electrocautery. Hemostasis was achieved with the electrocautery. Inspection of the right upper quadrant was performed. No bleeding, bile duct injury or leak, or bowel injury was noted.  The gallbladder was then placed in an Endopouch  bag. The robotic instruments were removed and robotic arms were undocked in the standard fashion.    I  scrubbed back in.  The gallbladder was removed via the 12 mm trocar site.  Using the PMI and a Carter-Thomason cone, the 12 mm port site was then closed with interrupted 0 Vicryl sutures.  The remaining 8 mm ports were removed and pneumoperitoneum was released.  Each port site was closed with deep dermal 3-0 Vicryl.  4-0 subcuticular Monocryl was used to close the skin. Dermabond was applied, followed by Steri-Strips.  The patient was then awakened, extubated, and taken to the postanesthesia recovery unit in stable condition.   Sponge, lap, and needle counts were reported to be correct number at closure and at the conclusion of the case.          Fredirick Maudlin, MD FACS

## 2021-06-09 NOTE — Anesthesia Postprocedure Evaluation (Signed)
Anesthesia Post Note  Patient: Stanley Marks  Procedure(s) Performed: XI ROBOTIC ASSISTED LAPAROSCOPIC CHOLECYSTECTOMY  Patient location during evaluation: PACU Anesthesia Type: General Level of consciousness: awake and alert and oriented Pain management: pain level controlled Vital Signs Assessment: post-procedure vital signs reviewed and stable Respiratory status: spontaneous breathing, nonlabored ventilation and respiratory function stable Cardiovascular status: blood pressure returned to baseline and stable Postop Assessment: no signs of nausea or vomiting Anesthetic complications: no   No notable events documented.   Last Vitals:  Vitals:   06/09/21 1330 06/09/21 1345  BP: (!) 160/108 (!) 150/101  Pulse: 78 91  Resp: 19 19  Temp:    SpO2: 93% 94%    Last Pain:  Vitals:   06/09/21 1345  TempSrc:   PainSc: 5                  Brinson Tozzi

## 2021-06-09 NOTE — Discharge Instructions (Signed)

## 2021-06-09 NOTE — Anesthesia Procedure Notes (Signed)
Procedure Name: Intubation Date/Time: 06/09/2021 10:38 AM Performed by: Esaw Grandchild, CRNA Pre-anesthesia Checklist: Patient identified, Emergency Drugs available, Suction available and Patient being monitored Patient Re-evaluated:Patient Re-evaluated prior to induction Oxygen Delivery Method: Circle system utilized Preoxygenation: Pre-oxygenation with 100% oxygen Induction Type: IV induction Ventilation: Mask ventilation without difficulty Laryngoscope Size: Miller and 2 Grade View: Grade I Tube type: Oral Tube size: 7.5 mm Number of attempts: 1 Airway Equipment and Method: Stylet and Oral airway Placement Confirmation: ETT inserted through vocal cords under direct vision, positive ETCO2 and breath sounds checked- equal and bilateral Secured at: 23 cm Tube secured with: Tape Dental Injury: Teeth and Oropharynx as per pre-operative assessment

## 2021-06-09 NOTE — Interval H&P Note (Signed)
History and Physical Interval Note:  06/09/2021 10:19 AM  Stanley Marks  has presented today for surgery, with the diagnosis of cholecystitis, drain in place.  The various methods of treatment have been discussed with the patient and family. After consideration of risks, benefits and other options for treatment, the patient has consented to  Procedure(s) with comments: XI ROBOTIC ASSISTED LAPAROSCOPIC CHOLECYSTECTOMY (N/A) - Provider requesting 3 hours / 180 minutes for procedure. as a surgical intervention.  The patient's history has been reviewed, patient examined, no change in status, stable for surgery.  I have reviewed the patient's chart and labs.  Questions were answered to the patient's satisfaction.     Fredirick Maudlin

## 2021-06-13 LAB — SURGICAL PATHOLOGY

## 2021-06-22 ENCOUNTER — Other Ambulatory Visit: Payer: Self-pay

## 2021-06-22 ENCOUNTER — Ambulatory Visit (INDEPENDENT_AMBULATORY_CARE_PROVIDER_SITE_OTHER): Payer: Medicare Other | Admitting: Physician Assistant

## 2021-06-22 ENCOUNTER — Encounter: Payer: Self-pay | Admitting: Physician Assistant

## 2021-06-22 VITALS — BP 138/85 | HR 103 | Temp 98.4°F | Ht 73.0 in | Wt 295.0 lb

## 2021-06-22 DIAGNOSIS — Z09 Encounter for follow-up examination after completed treatment for conditions other than malignant neoplasm: Secondary | ICD-10-CM

## 2021-06-22 DIAGNOSIS — K8000 Calculus of gallbladder with acute cholecystitis without obstruction: Secondary | ICD-10-CM

## 2021-06-22 MED ORDER — ACETAMINOPHEN 500 MG PO TABS: 1000.0000 mg | ORAL_TABLET | Freq: Four times a day (QID) | ORAL | 2 refills | Status: AC | PRN

## 2021-06-22 NOTE — Patient Instructions (Signed)

## 2021-06-22 NOTE — Progress Notes (Signed)
[facility] POST-OP OFFICE VISIT  06/22/2021  HPI: Stanley Marks is a 80 y.o. male 13 days s/p robotic assisted laparoscopic cholecystectomy for chronic cholecystitis with Dr Celine Ahr  He has been doing well since surgery Only taking tylenol for pain Some nausea but plans on getting Zofran Rx from PCP No fever, chills, emesis He is ambulating at his baseline No issues with incisions  Vital signs: BP 138/85   Pulse (!) 103   Temp 98.4 F (36.9 C) (Oral)   Ht 6\' 1"  (1.854 m)   Wt 295 lb (133.8 kg)   SpO2 95%   BMI 38.92 kg/m    Physical Exam: Constitutional: Well appearing gentleman, NAD Abdomen: Obese, soft, non-tender, non-distended, no rebound/guarding Skin: Laparoscopic incisions are healing well, no erythema or drainage   Assessment/Plan: This is a 80 y.o. male 13 days s/p robotic assisted laparoscopic cholecystectomy for chronic cholecystitis   - Pain control prn; refilled his Tylenol as he states this is cheaper through his insurance  - Reviewed wound care  - Reviewed surgical pathology: CCC, negative for malignancy   - He can follow up with surgery clinic on as needed basis   -- Edison Simon, PA-C [facility] 06/22/2021, 10:59 AM [phone] M-F: 7am - 4pm

## 2021-08-17 ENCOUNTER — Emergency Department: Payer: Medicare Other

## 2021-08-17 ENCOUNTER — Other Ambulatory Visit: Payer: Self-pay

## 2021-08-17 ENCOUNTER — Inpatient Hospital Stay
Admission: EM | Admit: 2021-08-17 | Discharge: 2021-08-18 | DRG: 641 | Discharge status: Against Medical Advice | Payer: Medicare Other | Attending: Internal Medicine | Admitting: Internal Medicine

## 2021-08-17 ENCOUNTER — Encounter: Payer: Self-pay | Admitting: Emergency Medicine

## 2021-08-17 DIAGNOSIS — R079 Chest pain, unspecified: Secondary | ICD-10-CM

## 2021-08-17 DIAGNOSIS — I130 Hypertensive heart and chronic kidney disease with heart failure and stage 1 through stage 4 chronic kidney disease, or unspecified chronic kidney disease: Secondary | ICD-10-CM | POA: Diagnosis present

## 2021-08-17 DIAGNOSIS — Z85828 Personal history of other malignant neoplasm of skin: Secondary | ICD-10-CM

## 2021-08-17 DIAGNOSIS — R109 Unspecified abdominal pain: Secondary | ICD-10-CM

## 2021-08-17 DIAGNOSIS — Z8616 Personal history of COVID-19: Secondary | ICD-10-CM

## 2021-08-17 DIAGNOSIS — K760 Fatty (change of) liver, not elsewhere classified: Secondary | ICD-10-CM | POA: Diagnosis present

## 2021-08-17 DIAGNOSIS — I4891 Unspecified atrial fibrillation: Secondary | ICD-10-CM | POA: Diagnosis present

## 2021-08-17 DIAGNOSIS — I5022 Chronic systolic (congestive) heart failure: Secondary | ICD-10-CM | POA: Diagnosis present

## 2021-08-17 DIAGNOSIS — Z79899 Other long term (current) drug therapy: Secondary | ICD-10-CM

## 2021-08-17 DIAGNOSIS — G4733 Obstructive sleep apnea (adult) (pediatric): Secondary | ICD-10-CM | POA: Diagnosis present

## 2021-08-17 DIAGNOSIS — I480 Paroxysmal atrial fibrillation: Secondary | ICD-10-CM | POA: Diagnosis present

## 2021-08-17 DIAGNOSIS — Z9981 Dependence on supplemental oxygen: Secondary | ICD-10-CM

## 2021-08-17 DIAGNOSIS — Z6838 Body mass index (BMI) 38.0-38.9, adult: Secondary | ICD-10-CM

## 2021-08-17 DIAGNOSIS — I739 Peripheral vascular disease, unspecified: Secondary | ICD-10-CM | POA: Diagnosis present

## 2021-08-17 DIAGNOSIS — F17210 Nicotine dependence, cigarettes, uncomplicated: Secondary | ICD-10-CM | POA: Diagnosis present

## 2021-08-17 DIAGNOSIS — Z8249 Family history of ischemic heart disease and other diseases of the circulatory system: Secondary | ICD-10-CM

## 2021-08-17 DIAGNOSIS — I700 Atherosclerosis of aorta: Secondary | ICD-10-CM | POA: Diagnosis present

## 2021-08-17 DIAGNOSIS — J9610 Chronic respiratory failure, unspecified whether with hypoxia or hypercapnia: Secondary | ICD-10-CM

## 2021-08-17 DIAGNOSIS — N189 Chronic kidney disease, unspecified: Secondary | ICD-10-CM | POA: Diagnosis present

## 2021-08-17 DIAGNOSIS — E785 Hyperlipidemia, unspecified: Secondary | ICD-10-CM | POA: Diagnosis present

## 2021-08-17 DIAGNOSIS — Z5329 Procedure and treatment not carried out because of patient's decision for other reasons: Secondary | ICD-10-CM | POA: Diagnosis present

## 2021-08-17 DIAGNOSIS — I2510 Atherosclerotic heart disease of native coronary artery without angina pectoris: Secondary | ICD-10-CM | POA: Diagnosis present

## 2021-08-17 DIAGNOSIS — I714 Abdominal aortic aneurysm, without rupture: Secondary | ICD-10-CM | POA: Diagnosis present

## 2021-08-17 DIAGNOSIS — J439 Emphysema, unspecified: Secondary | ICD-10-CM | POA: Diagnosis present

## 2021-08-17 DIAGNOSIS — Z7901 Long term (current) use of anticoagulants: Secondary | ICD-10-CM

## 2021-08-17 DIAGNOSIS — M545 Low back pain, unspecified: Secondary | ICD-10-CM | POA: Diagnosis present

## 2021-08-17 LAB — BASIC METABOLIC PANEL
Anion gap: 9 (ref 5–15)
BUN: 11 mg/dL (ref 8–23)
CO2: 28 mmol/L (ref 22–32)
Calcium: 9.3 mg/dL (ref 8.9–10.3)
Chloride: 101 mmol/L (ref 98–111)
Creatinine, Ser: 1.05 mg/dL (ref 0.61–1.24)
GFR, Estimated: >60 mL/min (ref >60)
Glucose, Bld: 120 mg/dL — ABNORMAL HIGH (ref 70–99)
Potassium: 3.6 mmol/L (ref 3.5–5.1)
Sodium: 138 mmol/L (ref 135–145)

## 2021-08-17 LAB — CBC
HCT: 54.8 % — ABNORMAL HIGH (ref 39.0–52.0)
Hemoglobin: 18.1 g/dL — ABNORMAL HIGH (ref 13.0–17.0)
MCH: 30.1 pg (ref 26.0–34.0)
MCHC: 33 g/dL (ref 30.0–36.0)
MCV: 91.2 fL (ref 80.0–100.0)
Platelets: 242 10*3/uL (ref 150–400)
RBC: 6.01 MIL/uL — ABNORMAL HIGH (ref 4.22–5.81)
RDW: 14.9 % (ref 11.5–15.5)
WBC: 10.4 10*3/uL (ref 4.0–10.5)
nRBC: 0 % (ref 0.0–0.2)

## 2021-08-17 LAB — HEPATIC FUNCTION PANEL
ALT: 15 U/L (ref 0–44)
AST: 15 U/L (ref 15–41)
Albumin: 4 g/dL (ref 3.5–5.0)
Alkaline Phosphatase: 76 U/L (ref 38–126)
Bilirubin, Direct: 0.2 mg/dL (ref 0.0–0.2)
Indirect Bilirubin: 0.8 mg/dL (ref 0.3–0.9)
Total Bilirubin: 1 mg/dL (ref 0.3–1.2)
Total Protein: 8 g/dL (ref 6.5–8.1)

## 2021-08-17 LAB — PROTIME-INR
INR: 1.1 (ref 0.8–1.2)
Prothrombin Time: 14.3 seconds (ref 11.4–15.2)

## 2021-08-17 LAB — LIPASE, BLOOD: Lipase: 32 U/L (ref 11–51)

## 2021-08-17 LAB — TROPONIN I (HIGH SENSITIVITY): Troponin I (High Sensitivity): 7 ng/L (ref <18)

## 2021-08-17 MED ORDER — SODIUM CHLORIDE 0.9 % IV BOLUS
500.0000 mL | Freq: Once | INTRAVENOUS | Status: AC
  Administered 2021-08-17: 500 mL via INTRAVENOUS

## 2021-08-17 MED ORDER — IOHEXOL 350 MG/ML SOLN
100.0000 mL | Freq: Once | INTRAVENOUS | Status: AC | PRN
  Administered 2021-08-17: 100 mL via INTRAVENOUS

## 2021-08-17 NOTE — ED Triage Notes (Signed)
Pt reports that he developed chest pain yesterday, Mid Sternal along with epigastric pain. He is nauseated , denies diaphoresis and SHOB.

## 2021-08-17 NOTE — ED Notes (Signed)
First nurse-pt brought in via ems from home with abd pain and chest pain  hx afib.  Bp 170/90 p150 per ems  pt alert.

## 2021-08-18 DIAGNOSIS — I5022 Chronic systolic (congestive) heart failure: Secondary | ICD-10-CM

## 2021-08-18 DIAGNOSIS — I480 Paroxysmal atrial fibrillation: Secondary | ICD-10-CM | POA: Diagnosis present

## 2021-08-18 DIAGNOSIS — Z85828 Personal history of other malignant neoplasm of skin: Secondary | ICD-10-CM | POA: Diagnosis not present

## 2021-08-18 DIAGNOSIS — I700 Atherosclerosis of aorta: Secondary | ICD-10-CM | POA: Diagnosis present

## 2021-08-18 DIAGNOSIS — I4891 Unspecified atrial fibrillation: Secondary | ICD-10-CM | POA: Diagnosis not present

## 2021-08-18 DIAGNOSIS — N189 Chronic kidney disease, unspecified: Secondary | ICD-10-CM | POA: Diagnosis present

## 2021-08-18 DIAGNOSIS — I10 Essential (primary) hypertension: Secondary | ICD-10-CM | POA: Diagnosis not present

## 2021-08-18 DIAGNOSIS — K760 Fatty (change of) liver, not elsewhere classified: Secondary | ICD-10-CM | POA: Diagnosis present

## 2021-08-18 DIAGNOSIS — I714 Abdominal aortic aneurysm, without rupture: Secondary | ICD-10-CM | POA: Diagnosis present

## 2021-08-18 DIAGNOSIS — I2510 Atherosclerotic heart disease of native coronary artery without angina pectoris: Secondary | ICD-10-CM | POA: Diagnosis present

## 2021-08-18 DIAGNOSIS — Z8616 Personal history of COVID-19: Secondary | ICD-10-CM | POA: Diagnosis not present

## 2021-08-18 DIAGNOSIS — R079 Chest pain, unspecified: Secondary | ICD-10-CM | POA: Diagnosis present

## 2021-08-18 DIAGNOSIS — F17210 Nicotine dependence, cigarettes, uncomplicated: Secondary | ICD-10-CM | POA: Diagnosis present

## 2021-08-18 DIAGNOSIS — Z8249 Family history of ischemic heart disease and other diseases of the circulatory system: Secondary | ICD-10-CM | POA: Diagnosis not present

## 2021-08-18 DIAGNOSIS — E785 Hyperlipidemia, unspecified: Secondary | ICD-10-CM | POA: Diagnosis present

## 2021-08-18 DIAGNOSIS — M545 Low back pain, unspecified: Secondary | ICD-10-CM | POA: Diagnosis present

## 2021-08-18 DIAGNOSIS — I739 Peripheral vascular disease, unspecified: Secondary | ICD-10-CM | POA: Diagnosis present

## 2021-08-18 DIAGNOSIS — I130 Hypertensive heart and chronic kidney disease with heart failure and stage 1 through stage 4 chronic kidney disease, or unspecified chronic kidney disease: Secondary | ICD-10-CM | POA: Diagnosis present

## 2021-08-18 DIAGNOSIS — G4733 Obstructive sleep apnea (adult) (pediatric): Secondary | ICD-10-CM | POA: Diagnosis present

## 2021-08-18 DIAGNOSIS — J439 Emphysema, unspecified: Secondary | ICD-10-CM | POA: Diagnosis present

## 2021-08-18 DIAGNOSIS — Z9981 Dependence on supplemental oxygen: Secondary | ICD-10-CM | POA: Diagnosis not present

## 2021-08-18 DIAGNOSIS — Z5329 Procedure and treatment not carried out because of patient's decision for other reasons: Secondary | ICD-10-CM | POA: Diagnosis present

## 2021-08-18 DIAGNOSIS — Z6838 Body mass index (BMI) 38.0-38.9, adult: Secondary | ICD-10-CM | POA: Diagnosis not present

## 2021-08-18 DIAGNOSIS — J449 Chronic obstructive pulmonary disease, unspecified: Secondary | ICD-10-CM | POA: Diagnosis not present

## 2021-08-18 DIAGNOSIS — Z79899 Other long term (current) drug therapy: Secondary | ICD-10-CM | POA: Diagnosis not present

## 2021-08-18 DIAGNOSIS — Z7901 Long term (current) use of anticoagulants: Secondary | ICD-10-CM | POA: Diagnosis not present

## 2021-08-18 LAB — CBC
HCT: 53.6 % — ABNORMAL HIGH (ref 39.0–52.0)
Hemoglobin: 17.3 g/dL — ABNORMAL HIGH (ref 13.0–17.0)
MCH: 29.5 pg (ref 26.0–34.0)
MCHC: 32.3 g/dL (ref 30.0–36.0)
MCV: 91.5 fL (ref 80.0–100.0)
Platelets: 231 10*3/uL (ref 150–400)
RBC: 5.86 MIL/uL — ABNORMAL HIGH (ref 4.22–5.81)
RDW: 15.1 % (ref 11.5–15.5)
WBC: 10.6 10*3/uL — ABNORMAL HIGH (ref 4.0–10.5)
nRBC: 0 % (ref 0.0–0.2)

## 2021-08-18 LAB — BASIC METABOLIC PANEL
Anion gap: 10 (ref 5–15)
BUN: 12 mg/dL (ref 8–23)
CO2: 25 mmol/L (ref 22–32)
Calcium: 9.4 mg/dL (ref 8.9–10.3)
Chloride: 101 mmol/L (ref 98–111)
Creatinine, Ser: 1.08 mg/dL (ref 0.61–1.24)
GFR, Estimated: >60 mL/min (ref >60)
Glucose, Bld: 103 mg/dL — ABNORMAL HIGH (ref 70–99)
Potassium: 4.4 mmol/L (ref 3.5–5.1)
Sodium: 136 mmol/L (ref 135–145)

## 2021-08-18 LAB — TSH: TSH: 1.116 u[IU]/mL (ref 0.350–4.500)

## 2021-08-18 LAB — RESP PANEL BY RT-PCR (FLU A&B, COVID) ARPGX2
Influenza A by PCR: NEGATIVE
Influenza B by PCR: NEGATIVE
SARS Coronavirus 2 by RT PCR: NEGATIVE

## 2021-08-18 LAB — TROPONIN I (HIGH SENSITIVITY): Troponin I (High Sensitivity): 7 ng/L (ref <18)

## 2021-08-18 MED ORDER — AMIODARONE HCL 200 MG PO TABS
200.0000 mg | ORAL_TABLET | Freq: Every day | ORAL | Status: DC
  Filled 2021-08-18: qty 1

## 2021-08-18 MED ORDER — MAGNESIUM HYDROXIDE 400 MG/5ML PO SUSP: 30.0000 mL | Freq: Every day | ORAL | Status: DC | PRN

## 2021-08-18 MED ORDER — ACETAMINOPHEN 325 MG PO TABS: 650.0000 mg | ORAL_TABLET | Freq: Four times a day (QID) | ORAL | Status: DC | PRN

## 2021-08-18 MED ORDER — OXYCODONE HCL 5 MG PO TABS
5.0000 mg | ORAL_TABLET | Freq: Four times a day (QID) | ORAL | Status: DC | PRN
  Administered 2021-08-18: 5 mg via ORAL
  Filled 2021-08-18: qty 1

## 2021-08-18 MED ORDER — SACUBITRIL-VALSARTAN 49-51 MG PO TABS
1.0000 | ORAL_TABLET | Freq: Two times a day (BID) | ORAL | Status: DC
  Filled 2021-08-18 (×2): qty 1

## 2021-08-18 MED ORDER — DILTIAZEM HCL-DEXTROSE 125-5 MG/125ML-% IV SOLN (PREMIX)
5.0000 mg/h | INTRAVENOUS | Status: DC
  Administered 2021-08-18: 5 mg/h via INTRAVENOUS
  Filled 2021-08-18: qty 125

## 2021-08-18 MED ORDER — TRAZODONE HCL 50 MG PO TABS: 25.0000 mg | ORAL_TABLET | Freq: Every evening | ORAL | Status: DC | PRN

## 2021-08-18 MED ORDER — OXYCODONE-ACETAMINOPHEN 5-325 MG PO TABS
2.0000 | ORAL_TABLET | Freq: Once | ORAL | Status: AC
  Administered 2021-08-18: 2 via ORAL
  Filled 2021-08-18: qty 2

## 2021-08-18 MED ORDER — METOPROLOL SUCCINATE ER 50 MG PO TB24: 100.0000 mg | ORAL_TABLET | Freq: Every day | ORAL | Status: DC

## 2021-08-18 MED ORDER — AMIODARONE HCL 200 MG PO TABS
200.0000 mg | ORAL_TABLET | ORAL | Status: AC
  Administered 2021-08-18: 200 mg via ORAL
  Filled 2021-08-18: qty 1

## 2021-08-18 MED ORDER — POTASSIUM CHLORIDE IN NACL 20-0.9 MEQ/L-% IV SOLN
INTRAVENOUS | Status: DC
  Filled 2021-08-18: qty 1000

## 2021-08-18 MED ORDER — ACETAMINOPHEN 650 MG RE SUPP: 650.0000 mg | Freq: Four times a day (QID) | RECTAL | Status: DC | PRN

## 2021-08-18 MED ORDER — SODIUM CHLORIDE 0.9 % IV SOLN: Freq: Once | INTRAVENOUS | Status: DC

## 2021-08-18 MED ORDER — ONDANSETRON HCL 4 MG/2ML IJ SOLN: 4.0000 mg | Freq: Four times a day (QID) | INTRAMUSCULAR | Status: DC | PRN

## 2021-08-18 MED ORDER — TORSEMIDE 20 MG PO TABS: 20.0000 mg | ORAL_TABLET | Freq: Every day | ORAL | Status: DC

## 2021-08-18 MED ORDER — SPIRONOLACTONE 25 MG PO TABS: 25.0000 mg | ORAL_TABLET | Freq: Every day | ORAL | Status: DC

## 2021-08-18 MED ORDER — RIVAROXABAN 15 MG PO TABS: 15.0000 mg | ORAL_TABLET | Freq: Every day | ORAL | Status: DC

## 2021-08-18 MED ORDER — ONDANSETRON HCL 4 MG PO TABS: 4.0000 mg | ORAL_TABLET | Freq: Four times a day (QID) | ORAL | Status: DC | PRN

## 2021-08-18 MED ORDER — DILTIAZEM HCL 25 MG/5ML IV SOLN
15.0000 mg | INTRAVENOUS | Status: AC
  Administered 2021-08-18: 15 mg via INTRAVENOUS
  Filled 2021-08-18: qty 5

## 2021-08-18 NOTE — ED Notes (Signed)
MD Jimmye Norman spoke with this pt and informed them of the dangers of leaving AMA. Pt verbalizes understanding with no questions, states they're still leaving. PT, MD, and this RN signed AMA form and form placed in chart at this time. Pt called family to pick them up.

## 2021-08-18 NOTE — ED Provider Notes (Signed)
[facility] Emergency Department Provider Note  ____________________________________________   Event Date/Time   First MD Initiated Contact with Patient 08/17/21 2300     (approximate)  I have reviewed the triage vital signs and the nursing notes.   HISTORY  Chief Complaint Chest Pain and Abdominal Pain    HPI Stanley Marks is a 80 y.o. 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 fib on Xarelto, and about a year of having a percutaneous drain in his gallbladder followed by robotic assisted cholecystectomy about 2 months ago.  The patient presents for several days of episodic pain in his abdomen that he describes as being in the center of his abdomen, sharp and stabbing, and severe.  He is also having central chest pressure that seems to be related to the abdominal pain.  He has had decreased oral intake recently and nausea with one episode of vomiting a couple of days ago but no persistent vomiting.  He also reports he has not had a bowel movement for several days but he is also been eating less than usual.  The patient denies fever, sore throat, and dysuria.  Nothing in particular makes his symptoms better or worse.  He says he feels "pretty good" right now, was sleeping comfortably, and denies any pain or nausea at this time.     Past Medical History:  Diagnosis Date   Aneurysm of infrarenal abdominal aorta (HCC)    a.) measured 3.6 cm on 03/05/2019 CT; b.) measured 3.2 cm on 07/21/2020 MRI   Aortic atherosclerosis (HCC)    Atrial fibrillation ([place])    on Xarelto   Basal cell carcinoma 08/24/2014   chin, L nose ant alar crease   Bilateral renal cysts    CAD (coronary artery disease)    Cardiomegaly    CHF (congestive heart failure) (HCC)    Chronic anticoagulation    Rivaroxaban   CKD (chronic kidney disease)    COPD (chronic obstructive  pulmonary disease) ([place])    Diverticulosis    Emphysema of lung (HCC)    Hepatic steatosis    History of 2019 novel coronavirus disease (COVID-19) 12/21/2019   Repeat testing on 01/22/2020 remained (+)   HLD (hyperlipidemia)    Hypertension    Obesity (BMI 30-39.9)    Obstructive sleep apnea    Pneumonia due to COVID-19 virus 01/22/2020   PVD (peripheral vascular disease) ([place])    Squamous cell carcinoma of skin 09/07/2014, 03/23/2020   L dorsal hand   Squamous cell carcinoma of skin 01/17/2015   R prox lateral dorsum of hand    Patient Active Problem List   Diagnosis Date Noted   Acute calculous cholecystitis 07/20/2020   Obesity (BMI 35.0-39.9 without comorbidity)    PVD (peripheral vascular disease) (HCC)    Wound of left leg    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 ([place]) 01/08/2020   Venous insufficiency (chronic) (peripheral) 01/08/2020   Acute respiratory failure due to COVID-19 ([place]) 12/22/2019   COPD with chronic bronchitis ([place]) 12/22/2019   Atrial fibrillation, chronic ([place]) 12/22/2019   Acute respiratory failure with hypoxia ([place]) 12/22/2019   Pneumonia due to COVID-19 virus 12/22/2019   Atrial fibrillation with RVR ([place])    Essential hypertension    Athscl heart disease of native coronary artery w/o ang pctrs 05/11/2019   Heart failure,  unspecified ([place]) 05/11/2019   Long term (current) use of anticoagulants 05/11/2019   Morbid (severe) obesity due to excess calories ([place]) 05/11/2019   Chronic kidney disease, unspecified 05/11/2019   Mixed hyperlipidemia 05/11/2019   Influenza A 01/28/2017   Pneumonia 01/28/2017   COPD exacerbation ([place]) 01/28/2017   Tobacco abuse counseling 01/28/2017   Patient's noncompliance with other medical treatment and regimen 01/28/2017   Emphysema of lung ([place]) 01/28/2017   Leukocytosis [id number]   Chronic systolic CHF (congestive heart failure) ([place]) 01/28/2017   Bradycardia 01/28/2017   Acute and chronic  respiratory failure with hypoxia ([place]) 01/26/2017   Obstructive sleep apnea syndrome 03/01/2016   Personal history of other malignant neoplasm of skin 12/08/2014    Past Surgical History:  Procedure Laterality Date   APPENDECTOMY     arm surgery     ELECTROPHYSIOLOGIC STUDY N/A 11/20/2016   Procedure: CARDIOVERSION;  Surgeon: Dionisio David, MD;  Location: [facility] ORS;  Service: Cardiovascular;  Laterality: N/A;   IR CATHETER TUBE CHANGE  05/05/2021   IR CHOLANGIOGRAM EXISTING TUBE  08/18/2020   IR PERC CHOLECYSTOSTOMY  07/22/2020   IR REMOVAL BILIARY DRAIN  01/20/2021    Prior to Admission medications   Medication Sig Start Date End Date Taking? Authorizing Provider  acetaminophen (TYLENOL) 500 MG tablet Take 2 tablets (1,000 mg total) by mouth every 6 (six) hours as needed. 06/22/21 06/22/22  Tylene Fantasia, PA-C  ADVAIR HFA [phone] MCG/ACT inhaler Inhale 1 puff into the lungs 2 (two) times daily as needed.  12/17/19   [provider]  amiodarone (PACERONE) 200 MG tablet Take 2 tablets (400 mg total) by mouth 2 (two) times daily. Patient taking differently: Take 200 mg by mouth daily. 01/28/20   Loletha Grayer, MD  ibuprofen (ADVIL) 800 MG tablet Take 1 tablet (800 mg total) by mouth every 8 (eight) hours as needed. 06/09/21   Fredirick Maudlin, MD  metoprolol succinate (TOPROL-XL) 100 MG 24 hr tablet Take 100 mg by mouth daily. 05/11/21   [provider]  ondansetron (ZOFRAN ODT) 4 MG disintegrating tablet Take 1 tablet (4 mg total) by mouth every 8 (eight) hours as needed for nausea or vomiting. 07/17/20   Paulette Blanch, MD  oxyCODONE (OXY IR/ROXICODONE) 5 MG immediate release tablet Take 1 tablet (5 mg total) by mouth every 6 (six) hours as needed for severe pain. 06/09/21   Fredirick Maudlin, MD  OXYGEN Inhale 3 L into the lungs daily as needed.    [provider]  Rivaroxaban (XARELTO) 15 MG TABS tablet Take 15 mg by mouth daily with supper.    [provider]   sacubitril-valsartan (ENTRESTO) 49-51 MG Take 1 tablet by mouth 2 (two) times daily.    [provider]  spironolactone (ALDACTONE) 25 MG tablet Take 25 mg by mouth daily.    [provider]  torsemide (DEMADEX) 20 MG tablet Take 20 mg by mouth daily.    [provider]  traMADol (ULTRAM) 50 MG tablet Take 50 mg by mouth 2 (two) times daily as needed. 06/06/21   [provider]    Allergies Atorvastatin and Lipitor [atorvastatin calcium]  Family History  Problem Relation Age of Onset   Heart disease Mother    Heart attack Father     Social History Social History   Tobacco Use   Smoking status: Every Day    Packs/day: 1.00    Years: 64.00    Pack years: 64.00    Types:  Cigarettes    Start date: 12/10/1956   Smokeless tobacco: Never  Vaping Use   Vaping Use: Never used  Substance Use Topics   Alcohol use: Not Currently    Comment: quit 1979; 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   Drug use: Never    Review of Systems Constitutional: No fever/chills Eyes: No visual changes. ENT: No sore throat. Cardiovascular: Episodic central chest pressure. Respiratory: Denies shortness of breath. Gastrointestinal: Episodic central sharp abdominal pain, currently absent. Genitourinary: Negative for dysuria. Musculoskeletal: Negative for neck pain.  Negative for back pain. Integumentary: Negative for rash. Neurological: Negative for headaches, focal weakness or numbness.   ____________________________________________   PHYSICAL EXAM:  VITAL SIGNS: ED Triage Vitals  Enc Vitals Group     BP 08/17/21 1919 (!) 140/109     Pulse Rate 08/17/21 1919 (!) 116     Resp 08/17/21 1919 18     Temp 08/17/21 1919 97.9 F (36.6 C)     Temp Source 08/17/21 1919 Oral     SpO2 08/17/21 1919 93 %     Weight 08/17/21 1916 131.5 kg (290 lb)     Height 08/17/21 1916 1.854 m ('6\' 1"'$ )     Head Circumference --      Peak Flow --      Pain Score 08/17/21 1916 4     Pain Loc  --      Pain Edu? --      Excl. in [place]? --     Constitutional: Alert and oriented.  Eyes: Conjunctivae are normal.  Head: Atraumatic. Nose: No congestion/rhinnorhea. Mouth/Throat: Patient is wearing a mask. Neck: No stridor.  No meningeal signs.   Cardiovascular: Normal rate, regular rhythm. Good peripheral circulation. Respiratory: Normal respiratory effort.  No retractions.  Patient on his chronic 2-3L O2 by [place]. Gastrointestinal: Obese. Soft and nondistended. Mild tenderness to palpation but not localized with no rebound or guarding. Musculoskeletal: No lower extremity tenderness nor edema. No gross deformities of extremities. Neurologic:  Normal speech and language. No gross focal neurologic deficits are appreciated.  Skin:  Skin is warm, dry and intact. Psychiatric: Mood and affect are normal. Speech and behavior are normal.  ____________________________________________   LABS (all labs ordered are listed, but only abnormal results are displayed)  Labs Reviewed  BASIC METABOLIC PANEL - Abnormal; Notable for the following components:      Result Value   Glucose, Bld 120 (*)    All other components within normal limits  CBC - Abnormal; Notable for the following components:   RBC 6.01 (*)    Hemoglobin 18.1 (*)    HCT 54.8 (*)    All other components within normal limits  RESP PANEL BY RT-PCR (FLU A&B, COVID) ARPGX2  PROTIME-INR  HEPATIC FUNCTION PANEL  LIPASE, BLOOD  TROPONIN I (HIGH SENSITIVITY)  TROPONIN I (HIGH SENSITIVITY)   ____________________________________________  EKG  ED ECG REPORT I, Hinda Kehr, the attending physician, personally viewed and interpreted this ECG.  Date: 08/17/2021 EKG Time: 19:16 Rate: 136 Rhythm: atrial fibrillation w/ RVR QRS Axis: normal Intervals: normal ST/T Wave abnormalities: Non-specific ST segment / T-wave changes, but no clear evidence of acute ischemia. Narrative Interpretation: no definitive evidence of acute ischemia;  does not meet STEMI criteria.  ____________________________________________  RADIOLOGY I, Hinda Kehr, personally viewed and evaluated these images (plain radiographs) as part of my medical decision making, as well as reviewing the written report by the radiologist.  ED MD interpretation:  No acute abnormalities  Official  radiology report(s): DG Chest 2 View  Result Date: 08/17/2021 CLINICAL DATA:  Chest pain EXAM: CHEST - 2 VIEW COMPARISON:  07/17/2020 FINDINGS: Stable chronic interstitial thickening. No confluent pulmonary infiltrate. No pneumothorax or pleural effusion. Cardiac size is mildly enlarged, unchanged. Osseous structures are age-appropriate. IMPRESSION: No active cardiopulmonary disease.  Stable cardiomegaly. Electronically Signed   By: Fidela Salisbury M.D.   On: 08/17/2021 20:06   CT Angio Chest PE W and/or Wo Contrast  Result Date: 08/18/2021 CLINICAL DATA:  Chest pain EXAM: CT ANGIOGRAPHY CHEST WITH CONTRAST TECHNIQUE: Multidetector CT imaging of the chest was performed using the standard protocol during bolus administration of intravenous contrast. Multiplanar CT image reconstructions and MIPs were obtained to evaluate the vascular anatomy. CONTRAST:  115m OMNIPAQUE IOHEXOL 350 MG/ML SOLN COMPARISON:  None. FINDINGS: Cardiovascular: No filling defects in the pulmonary arteries to suggest pulmonary emboli. Mild cardiomegaly. Diffuse coronary artery calcifications and aortic calcifications. No aneurysm. Mediastinum/Nodes: No mediastinal, hilar, or axillary adenopathy. Trachea and esophagus are unremarkable. Lungs/Pleura: Linear scarring or atelectasis in the lung bases. No effusions. No confluent airspace opacities. Calcified granuloma in the right lower lobe. Upper Abdomen: Imaging into the upper abdomen demonstrates no acute findings. Musculoskeletal: Chest wall soft tissues are unremarkable. No acute bony abnormality. Review of the MIP images confirms the above findings. IMPRESSION:  No evidence of pulmonary embolus. Cardiomegaly, diffuse coronary artery disease. No acute cardiopulmonary disease. Aortic Atherosclerosis (ICD10-I70.0). Electronically Signed   By: KRolm BaptiseM.D.   On: 08/18/2021 00:05   CT ABDOMEN PELVIS W CONTRAST  Result Date: 08/18/2021 CLINICAL DATA:  Epigastric pain, nausea, vomiting EXAM: CT ABDOMEN AND PELVIS WITH CONTRAST TECHNIQUE: Multidetector CT imaging of the abdomen and pelvis was performed using the standard protocol following bolus administration of intravenous contrast. CONTRAST:  1032mOMNIPAQUE IOHEXOL 350 MG/ML SOLN COMPARISON:  03/05/2019 FINDINGS: Lower chest: Scarring or atelectasis in the lung bases. Coronary artery and aortic calcifications. Hepatobiliary: No focal liver abnormality is seen. Status post cholecystectomy. No biliary dilatation. Pancreas: No focal abnormality or ductal dilatation. Spleen: No focal abnormality.  Normal size. Adrenals/Urinary Tract: 2.5 cm rim calcified cyst in the upper pole of the left kidney. Other scattered cysts, the largest on the right measure up to 4.9 cm. No hydronephrosis. Adrenal glands and urinary bladder unremarkable. Stomach/Bowel: Diffuse colonic diverticulosis. No active diverticulitis. Stomach and small bowel decompressed, unremarkable. Vascular/Lymphatic: 3.5 cm infrarenal abdominal aortic aneurysm. Diffuse aortic atherosclerosis. No adenopathy. Reproductive: No visible focal abnormality. Other: No free fluid or free air. Musculoskeletal: No acute bony abnormality. IMPRESSION: Diffuse colonic diverticulosis.  No active diverticulitis. No acute findings in the abdomen or pelvis. 3.5 cm infrarenal abdominal aortic aneurysm. Recommend follow-up ultrasound every 2 years. This recommendation follows ACR consensus guidelines: White Paper of the ACR Incidental Findings Committee II on Vascular Findings. J Am Coll Radiol 2013; 10:789-794. Electronically Signed   By: KeRolm Baptise.D.   On: 08/18/2021 00:09     ____________________________________________   PROCEDURES   Procedure(s) performed (including Critical Care):  .1-3 Lead EKG Interpretation Performed by: FoHinda KehrMD Authorized by: FoHinda KehrMD     Interpretation: abnormal     ECG rate:  115   ECG rate assessment: tachycardic     Rhythm: atrial fibrillation     Ectopy: none     Conduction: normal   .Critical Care Performed by: FoHinda KehrMD Authorized by: FoHinda KehrMD   Critical care provider statement:    Critical care time (minutes):  30  Critical care time was exclusive of:  Separately billable procedures and treating other patients   Critical care was necessary to treat or prevent imminent or life-threatening deterioration of the following conditions:  Circulatory failure   Critical care was time spent personally by me on the following activities:  Development of treatment plan with patient or surrogate, discussions with consultants, evaluation of patient's response to treatment, examination of patient, obtaining history from patient or surrogate, ordering and performing treatments and interventions, ordering and review of laboratory studies, ordering and review of radiographic studies, pulse oximetry, re-evaluation of patient's condition and review of old charts   ____________________________________________   [place] / MDM / [place] / ED COURSE  As part of my medical decision making, I reviewed the following data within the [facility] notes reviewed and incorporated, Labs reviewed , EKG interpreted , Old chart reviewed, Radiograph reviewed , Discussed with admitting physician , and Notes from prior ED visits   Differential diagnosis includes, but is not limited to, post-operative complication, PE, pneumonia, viral infection, SBO/ileus, constipation/obstipation.  The patient is on the cardiac monitor to evaluate for evidence of arrhythmia and/or  significant heart rate changes.  The patient's vital signs are notable for persistent tachycardia with known A. fib.  When I entered the room he was fast asleep but his heart rate was about 115 which is of some concern.  He is on Xarelto but has poor mobilization at baseline and has been recovering from his surgery.  He is on chronic oxygen and is not reporting any worsening shortness of breath but is having some chest pain in addition to the abdominal pain.  BMP and CBC are essentially normal although his hemoglobin is somewhat concentrated and I wonder about volume depletion.  Given that, his report of decreased oral intake, and his tachycardia, I am providing a small fluid bolus (he also has a history of CHF) of 500 mL normal saline.  His hepatic function panel and lipase are normal.  I am evaluating with CTA chest to rule out PE and CT abdomen/pelvis with IV contrast to look for any postsurgical infection, rule out SBO/ileus, etc.     Clinical Course as of 08/18/21 0333  Fri Aug 18, 2021  0019 CT Angio Chest PE W and/or Wo Contrast No acute abnormalities identified on CTA chest. [CF]  0019 CT ABDOMEN PELVIS W CONTRAST No acute abnormalities identified in the CT abdomen/pelvis.  He has a known infrarenal aortic aneurysm but it seems stable from prior.  There is no clear indication of why the patient has been having discomfort.  I will continue with the plan for rehydration and then reassess. [CF]  [id number] Patient's scans are reassuring.  Have the patient is increasingly tachycardic rather than improving after the normal saline.  At rest he is now about 130, when he gets up and moves around he jumps up to the 160s.  I see that he takes amiodarone regularly and he did not have his evening dose.  I am going to treat with diltiazem 15 mg IV to try and control his rate and I am going to give him his regular dose of amiodarone p.o. so that can be working as well.  The patient continues to complain of pain  in his back that he says is caused by the bed, so I will give 2 Percocet so that they will last longer.  I told the patient that at this point he will likely  need admission to the hospital and he agrees with the plan. [CF]  [id number] Patient's heart rate has improved down to about 106 at rest.  Consulting hospitalist for admission. [CF]  [id number] Also starting normal saline infusion at 100 mils per hour. [CF]  0331 Discussed case by phone with Dr. Sidney Ace with the hospitalist service and he will admit [CF]    Clinical Course User Index [CF] Hinda Kehr, MD     ____________________________________________  FINAL CLINICAL IMPRESSION(S) / ED DIAGNOSES  Final diagnoses:  Atrial fibrillation with RVR ([place])  Chest pain, unspecified type  Abdominal pain, unspecified abdominal location  Acute low back pain without sciatica, unspecified back pain laterality  Chronic respiratory failure, unspecified whether with hypoxia or hypercapnia ([place])     MEDICATIONS GIVEN DURING THIS VISIT:  Medications  amiodarone (PACERONE) tablet 200 mg (has no administration in time range)  0.9 %  sodium chloride infusion (has no administration in time range)  sodium chloride 0.9 % bolus 500 mL (0 mLs Intravenous Stopped 08/18/21 0306)  iohexol (OMNIPAQUE) 350 MG/ML injection 100 mL (100 mLs Intravenous Contrast Given 08/17/21 2353)  diltiazem (CARDIZEM) injection 15 mg (15 mg Intravenous Given 08/18/21 0303)  oxyCODONE-acetaminophen (PERCOCET/ROXICET) 5-325 MG per tablet 2 tablet (2 tablets Oral Given 08/18/21 0255)     ED Discharge Orders     None        Note:  This document was prepared using Dragon voice recognition software and may include unintentional dictation errors.   Hinda Kehr, MD 08/18/21 [phone]

## 2021-08-18 NOTE — ED Notes (Signed)
Pt given breakfast tray at this time. 

## 2021-08-18 NOTE — ED Notes (Signed)
This RN spoke with med rec about medication reconciliation for morning medications

## 2021-08-18 NOTE — Discharge Summary (Addendum)
Physician Discharge Summary  Stanley Marks [id number] DOB: Apr 20, 1941 DOA: 08/17/2021  PCP: Perrin Maltese, MD  Admit date: 08/17/2021 Discharge date: 08/18/2021  Admitted From: home  Disposition:  Pt left AMA   Recommendations for Outpatient Follow-up:  Pt left AMA   Home Health: no  Equipment/Devices  Discharge Condition: guarded CODE STATUS: full  Diet recommendation: Heart Healthy   Brief/Interim Summary: HPI was taken from Dr. Sidney Marks: Stanley Marks is a 80 y.o. Caucasian male with medical history significant for coronary artery disease, CHF, COPD, CKD, dyslipidemia, hypertension, atrial fibrillation on Xarelto, obstructive sleep apnea and peripheral vascular disease, who presented to the ER with acute onset of intermittent mid abdominal pain occasionally sharp and stabbing, severe in intensity as well as central chest pain felt as pressure and graded 9/10 in severity with radiation to his back without dyspnea or palpitations.  The patient has been having diminished p.o. intake and associated nausea with one vomiting episode a couple of days ago with no recurrence.  No orthopnea or paroxysmal nocturnal dyspnea.  No fever or chills no melena or bright red blood per rectum or diarrhea.  No dysuria, oliguria or hematuria or flank pain.  No other bleeding diathesis.  He continues to smoke a pack of cigarettes per day.  ED Course: Upon presentation to the emergency room blood pressure was 140/109 and heart rate was 116 and later 140 and the respiratory rate was up to 25.  Labs revealed unremarkable BMP and LFTs.  Lipase was 32.  High-sensitivity troponin I was 7 twice.  CBC showed hemoconcentration.  Influenza antigens and COVID-19 PCR were sent and are currently pending.     EKG as reviewed by me : Showed atrial fibrillation with rapid ventricular sponsor of 136. Imaging: Chest x-ray showed stable cardiomegaly with no acute cardiopulmonary disease.     The patient was given 20 mg of p.o.  amiodarone and 50 mg of IV diltiazem and rate was down to 106 and later up to 120.  The patient was also given 2 p.o. Percocet and IV normal saline bolus of 500 mL followed by 100 mill per hour.  He will be admitted to progressive unit bed for further evaluation and management.   Pt unfortunately decided he wanted to leave AMA despite my advisement not to. Pt was AA&Ox4. Pt signed [place] paperwork and left the hospital.   Discharge Diagnoses:  Active Problems:   Atrial fibrillation with RVR (HCC) A. fib: w/ RVR. Likely PAF. Continue on amiodarone and IV cardizem drip. Continue on tele. Cardio consulted.  HTN: continue on home dose of metoprolol, entresto   Chronic systolic CHF: continue on home dose of entresto, aldactone, metoprolol, & torsemide    COPD: w/o exacerbation. Continue on bronchodilators   Tobacco abuse: smoking cessation counseling   Morbid obesity: BMI 38.2. Complicates overall care and prognosis     Discharge Instructions  Discharge Instructions     Amb referral to [facility]   Complete by: As directed         Allergies  Allergen Reactions   Atorvastatin    Lipitor [Atorvastatin Calcium] Other (See Comments)    arthralgia    Consultations: cardio   Procedures/Studies: DG Chest 2 View  Result Date: 08/17/2021 CLINICAL DATA:  Chest pain EXAM: CHEST - 2 VIEW COMPARISON:  07/17/2020 FINDINGS: Stable chronic interstitial thickening. No confluent pulmonary infiltrate. No pneumothorax or pleural effusion. Cardiac size is mildly enlarged, unchanged. Osseous structures are age-appropriate. IMPRESSION: No active cardiopulmonary disease.  Stable cardiomegaly. Electronically Signed   By: Fidela Salisbury M.D.   On: 08/17/2021 20:06   CT Angio Chest PE W and/or Wo Contrast  Result Date: 08/18/2021 CLINICAL DATA:  Chest pain EXAM: CT ANGIOGRAPHY CHEST WITH CONTRAST TECHNIQUE: Multidetector CT imaging of the chest was performed using the standard protocol during bolus  administration of intravenous contrast. Multiplanar CT image reconstructions and MIPs were obtained to evaluate the vascular anatomy. CONTRAST:  152m OMNIPAQUE IOHEXOL 350 MG/ML SOLN COMPARISON:  None. FINDINGS: Cardiovascular: No filling defects in the pulmonary arteries to suggest pulmonary emboli. Mild cardiomegaly. Diffuse coronary artery calcifications and aortic calcifications. No aneurysm. Mediastinum/Nodes: No mediastinal, hilar, or axillary adenopathy. Trachea and esophagus are unremarkable. Lungs/Pleura: Linear scarring or atelectasis in the lung bases. No effusions. No confluent airspace opacities. Calcified granuloma in the right lower lobe. Upper Abdomen: Imaging into the upper abdomen demonstrates no acute findings. Musculoskeletal: Chest wall soft tissues are unremarkable. No acute bony abnormality. Review of the MIP images confirms the above findings. IMPRESSION: No evidence of pulmonary embolus. Cardiomegaly, diffuse coronary artery disease. No acute cardiopulmonary disease. Aortic Atherosclerosis (ICD10-I70.0). Electronically Signed   By: KRolm BaptiseM.D.   On: 08/18/2021 00:05   CT ABDOMEN PELVIS W CONTRAST  Result Date: 08/18/2021 CLINICAL DATA:  Epigastric pain, nausea, vomiting EXAM: CT ABDOMEN AND PELVIS WITH CONTRAST TECHNIQUE: Multidetector CT imaging of the abdomen and pelvis was performed using the standard protocol following bolus administration of intravenous contrast. CONTRAST:  1069mOMNIPAQUE IOHEXOL 350 MG/ML SOLN COMPARISON:  03/05/2019 FINDINGS: Lower chest: Scarring or atelectasis in the lung bases. Coronary artery and aortic calcifications. Hepatobiliary: No focal liver abnormality is seen. Status post cholecystectomy. No biliary dilatation. Pancreas: No focal abnormality or ductal dilatation. Spleen: No focal abnormality.  Normal size. Adrenals/Urinary Tract: 2.5 cm rim calcified cyst in the upper pole of the left kidney. Other scattered cysts, the largest on the right  measure up to 4.9 cm. No hydronephrosis. Adrenal glands and urinary bladder unremarkable. Stomach/Bowel: Diffuse colonic diverticulosis. No active diverticulitis. Stomach and small bowel decompressed, unremarkable. Vascular/Lymphatic: 3.5 cm infrarenal abdominal aortic aneurysm. Diffuse aortic atherosclerosis. No adenopathy. Reproductive: No visible focal abnormality. Other: No free fluid or free air. Musculoskeletal: No acute bony abnormality. IMPRESSION: Diffuse colonic diverticulosis.  No active diverticulitis. No acute findings in the abdomen or pelvis. 3.5 cm infrarenal abdominal aortic aneurysm. Recommend follow-up ultrasound every 2 years. This recommendation follows ACR consensus guidelines: White Paper of the ACR Incidental Findings Committee II on Vascular Findings. J Am Coll Radiol 2013; 10:789-794. Electronically Signed   By: KeRolm Baptise.D.   On: 08/18/2021 00:09   (Echo, Carotid, EGD, Colonoscopy, ERCP)    Subjective: pt c/o pain and malaise    Discharge Exam: Vitals:   08/18/21 1102 08/18/21 1121  BP: (!) 146/88   Pulse:  (!) 101  Resp: (!) 23 18  Temp:    SpO2: 93% 96%   Vitals:   08/18/21 0930 08/18/21 1000 08/18/21 1102 08/18/21 1121  BP: (!) 126/92  (!) 146/88   Pulse:  82  (!) 101  Resp: 17 15 (!) 23 18  Temp:      TempSrc:      SpO2: 93% 95% 93% 96%  Weight:      Height:        General: Pt is alert, awake, not in acute distress Cardiovascular: irregularly irregular, no rubs, no gallops Respiratory: diminished breath sounds b/l Abdominal: Soft, NT, obese, bowel sounds + Extremities: b/l LE  edema, no cyanosis    The results of significant diagnostics from this hospitalization (including imaging, microbiology, ancillary and laboratory) are listed below for reference.     Microbiology: Recent Results (from the past 240 hour(s))  Resp Panel by RT-PCR (Flu A&B, Covid) Nasopharyngeal Swab     Status: None   Collection Time: 08/18/21  3:00 AM   Specimen:  Nasopharyngeal Swab; Nasopharyngeal(NP) swabs in vial transport medium  Result Value Ref Range Status   SARS Coronavirus 2 by RT PCR NEGATIVE NEGATIVE Final    Comment: (NOTE) SARS-CoV-2 target nucleic acids are NOT DETECTED.  The SARS-CoV-2 RNA is generally detectable in upper respiratory specimens during the acute phase of infection. The lowest concentration of SARS-CoV-2 viral copies this assay can detect is 138 copies/mL. A negative result does not preclude SARS-Cov-2 infection and should not be used as the sole basis for treatment or other patient management decisions. A negative result may occur with  improper specimen collection/handling, submission of specimen other than nasopharyngeal swab, presence of viral mutation(s) within the areas targeted by this assay, and inadequate number of viral copies(<138 copies/mL). A negative result must be combined with clinical observations, patient history, and epidemiological information. The expected result is Negative.  Fact Sheet for Patients:  EntrepreneurPulse.com.au  Fact Sheet for Healthcare Providers:  IncredibleEmployment.be  This test is no t yet approved or cleared by the [place] FDA and  has been authorized for detection and/or diagnosis of SARS-CoV-2 by FDA under an Emergency Use Authorization (EUA). This EUA will remain  in effect (meaning this test can be used) for the duration of the COVID-19 declaration under Section 564(b)(1) of the Act, 21 U.S.C.section 360bbb-3(b)(1), unless the authorization is terminated  or revoked sooner.       Influenza A by PCR NEGATIVE NEGATIVE Final   Influenza B by PCR NEGATIVE NEGATIVE Final    Comment: (NOTE) The Xpert Xpress SARS-CoV-2/FLU/RSV plus assay is intended as an aid in the diagnosis of influenza from Nasopharyngeal swab specimens and should not be used as a sole basis for treatment. Nasal washings and aspirates are unacceptable for  Xpert Xpress SARS-CoV-2/FLU/RSV testing.  Fact Sheet for Patients: EntrepreneurPulse.com.au  Fact Sheet for Healthcare Providers: IncredibleEmployment.be  This test is not yet approved or cleared by the [place] FDA and has been authorized for detection and/or diagnosis of SARS-CoV-2 by FDA under an Emergency Use Authorization (EUA). This EUA will remain in effect (meaning this test can be used) for the duration of the COVID-19 declaration under Section 564(b)(1) of the Act, 21 U.S.C. section 360bbb-3(b)(1), unless the authorization is terminated or revoked.  Performed at [facility], [address] [place], [place] [address]      Labs: BNP (last 3 results) No results for input(s): BNP in the last 8760 hours. Basic Metabolic Panel: Recent Labs  Lab 08/17/21 1924 08/18/21 0704  NA 138 136  K 3.6 4.4  CL 101 101  CO2 28 25  GLUCOSE 120* 103*  BUN 11 12  CREATININE 1.05 1.08  CALCIUM 9.3 9.4   Liver Function Tests: Recent Labs  Lab 08/17/21 1921  AST 15  ALT 15  ALKPHOS 76  BILITOT 1.0  PROT 8.0  ALBUMIN 4.0   Recent Labs  Lab 08/17/21 1921  LIPASE 32   No results for input(s): AMMONIA in the last 168 hours. CBC: Recent Labs  Lab 08/17/21 1924 08/18/21 0704  WBC 10.4 10.6*  HGB 18.1* 17.3*  HCT 54.8* 53.6*  MCV  91.2 91.5  PLT 242 231   Cardiac Enzymes: No results for input(s): CKTOTAL, CKMB, CKMBINDEX, TROPONINI in the last 168 hours. BNP: Invalid input(s): POCBNP CBG: No results for input(s): GLUCAP in the last 168 hours. D-Dimer No results for input(s): DDIMER in the last 72 hours. Hgb A1c No results for input(s): HGBA1C in the last 72 hours. Lipid Profile No results for input(s): CHOL, HDL, LDLCALC, TRIG, CHOLHDL, LDLDIRECT in the last 72 hours. Thyroid function studies Recent Labs    08/18/21 0704  TSH 1.116   Anemia work up No results for input(s): VITAMINB12, FOLATE, FERRITIN,  TIBC, IRON, RETICCTPCT in the last 72 hours. Urinalysis    Component Value Date/Time   COLORURINE YELLOW (A) 01/24/2020 1214   APPEARANCEUR CLEAR (A) 01/24/2020 1214   LABSPEC 1.008 01/24/2020 1214   PHURINE 5.0 01/24/2020 1214   GLUCOSEU NEGATIVE 01/24/2020 1214   HGBUR NEGATIVE 01/24/2020 1214   BILIRUBINUR NEGATIVE 01/24/2020 1214   KETONESUR NEGATIVE 01/24/2020 1214   PROTEINUR NEGATIVE 01/24/2020 1214   NITRITE NEGATIVE 01/24/2020 1214   LEUKOCYTESUR NEGATIVE 01/24/2020 1214   Sepsis Labs Invalid input(s): PROCALCITONIN,  WBC,  LACTICIDVEN Microbiology Recent Results (from the past 240 hour(s))  Resp Panel by RT-PCR (Flu A&B, Covid) Nasopharyngeal Swab     Status: None   Collection Time: 08/18/21  3:00 AM   Specimen: Nasopharyngeal Swab; Nasopharyngeal(NP) swabs in vial transport medium  Result Value Ref Range Status   SARS Coronavirus 2 by RT PCR NEGATIVE NEGATIVE Final    Comment: (NOTE) SARS-CoV-2 target nucleic acids are NOT DETECTED.  The SARS-CoV-2 RNA is generally detectable in upper respiratory specimens during the acute phase of infection. The lowest concentration of SARS-CoV-2 viral copies this assay can detect is 138 copies/mL. A negative result does not preclude SARS-Cov-2 infection and should not be used as the sole basis for treatment or other patient management decisions. A negative result may occur with  improper specimen collection/handling, submission of specimen other than nasopharyngeal swab, presence of viral mutation(s) within the areas targeted by this assay, and inadequate number of viral copies(<138 copies/mL). A negative result must be combined with clinical observations, patient history, and epidemiological information. The expected result is Negative.  Fact Sheet for Patients:  EntrepreneurPulse.com.au  Fact Sheet for Healthcare Providers:  IncredibleEmployment.be  This test is no t yet approved or  cleared by the [place] FDA and  has been authorized for detection and/or diagnosis of SARS-CoV-2 by FDA under an Emergency Use Authorization (EUA). This EUA will remain  in effect (meaning this test can be used) for the duration of the COVID-19 declaration under Section 564(b)(1) of the Act, 21 U.S.C.section 360bbb-3(b)(1), unless the authorization is terminated  or revoked sooner.       Influenza A by PCR NEGATIVE NEGATIVE Final   Influenza B by PCR NEGATIVE NEGATIVE Final    Comment: (NOTE) The Xpert Xpress SARS-CoV-2/FLU/RSV plus assay is intended as an aid in the diagnosis of influenza from Nasopharyngeal swab specimens and should not be used as a sole basis for treatment. Nasal washings and aspirates are unacceptable for Xpert Xpress SARS-CoV-2/FLU/RSV testing.  Fact Sheet for Patients: EntrepreneurPulse.com.au  Fact Sheet for Healthcare Providers: IncredibleEmployment.be  This test is not yet approved or cleared by the [place] FDA and has been authorized for detection and/or diagnosis of SARS-CoV-2 by FDA under an Emergency Use Authorization (EUA). This EUA will remain in effect (meaning this test can be used) for the duration of the COVID-19 declaration  under Section 564(b)(1) of the Act, 21 U.S.C. section 360bbb-3(b)(1), unless the authorization is terminated or revoked.  Performed at [facility], [address], [place], [place] [address]      Time coordinating discharge: Over 30 minutes  SIGNED:   Wyvonnia Dusky, MD  [facility] 08/18/2021, 3:31 PM Pager   If 7PM-7AM, please contact night-coverage

## 2021-08-18 NOTE — ED Notes (Signed)
This RN messaged MD Jimmye Norman at this time regarding this pt's request to speak with them and this pt's report of not wanting to stay in the hospital.

## 2021-08-18 NOTE — H&P (Addendum)
[place]   PATIENT NAME: Stanley Marks    MR#:  [id number]  DATE OF BIRTH:  02-Aug-1941  DATE OF ADMISSION:  08/17/2021  PRIMARY CARE PHYSICIAN: Perrin Maltese, MD   Patient is coming from: Home  REQUESTING/REFERRING PHYSICIAN: Hinda Kehr, MD  CHIEF COMPLAINT:   Chief Complaint  Patient presents with   Chest Pain   Abdominal Pain    HISTORY OF PRESENT ILLNESS:  Stanley Marks is a 80 y.o. Caucasian male with medical history significant for coronary artery disease, CHF, COPD, CKD, dyslipidemia, hypertension, atrial fibrillation on Xarelto, obstructive sleep apnea and peripheral vascular disease, who presented to the ER with acute onset of intermittent mid abdominal pain occasionally sharp and stabbing, severe in intensity as well as central chest pain felt as pressure and graded 9/10 in severity with radiation to his back without dyspnea or palpitations.  The patient has been having diminished p.o. intake and associated nausea with one vomiting episode a couple of days ago with no recurrence.  No orthopnea or paroxysmal nocturnal dyspnea.  No fever or chills no melena or bright red blood per rectum or diarrhea.  No dysuria, oliguria or hematuria or flank pain.  No other bleeding diathesis.  He continues to smoke a pack of cigarettes per day.  ED Course: Upon presentation to the emergency room blood pressure was 140/109 and heart rate was 116 and later 140 and the respiratory rate was up to 25.  Labs revealed unremarkable BMP and LFTs.  Lipase was 32.  High-sensitivity troponin I was 7 twice.  CBC showed hemoconcentration.  Influenza antigens and COVID-19 PCR were sent and are currently pending.    EKG as reviewed by me : Showed atrial fibrillation with rapid ventricular sponsor of 136. Imaging: Chest x-ray showed stable cardiomegaly with no acute cardiopulmonary disease.    The patient was given 20 mg of p.o. amiodarone and 50 mg of IV diltiazem and rate was down to 106 and  later up to 120.  The patient was also given 2 p.o. Percocet and IV normal saline bolus of 500 mL followed by 100 mill per hour.  He will be admitted to progressive unit bed for further evaluation and management. PAST MEDICAL HISTORY:   Past Medical History:  Diagnosis Date   Aneurysm of infrarenal abdominal aorta ([place])    a.) measured 3.6 cm on 03/05/2019 CT; b.) measured 3.2 cm on 07/21/2020 MRI   Aortic atherosclerosis (HCC)    Atrial fibrillation (HCC)    on Xarelto   Basal cell carcinoma 08/24/2014   chin, L nose ant alar crease   Bilateral renal cysts    CAD (coronary artery disease)    Cardiomegaly    CHF (congestive heart failure) (HCC)    Chronic anticoagulation    Rivaroxaban   CKD (chronic kidney disease)    COPD (chronic obstructive pulmonary disease) ([place])    Diverticulosis    Emphysema of lung (HCC)    Hepatic steatosis    History of 2019 novel coronavirus disease (COVID-19) 12/21/2019   Repeat testing on 01/22/2020 remained (+)   HLD (hyperlipidemia)    Hypertension    Obesity (BMI 30-39.9)    Obstructive sleep apnea    Pneumonia due to COVID-19 virus 01/22/2020   PVD (peripheral vascular disease) ([place])    Squamous cell carcinoma of skin 09/07/2014, 03/23/2020   L dorsal hand   Squamous cell carcinoma of skin 01/17/2015   R prox lateral dorsum of hand  PAST SURGICAL HISTORY:   Past Surgical History:  Procedure Laterality Date   APPENDECTOMY     arm surgery     ELECTROPHYSIOLOGIC STUDY N/A 11/20/2016   Procedure: CARDIOVERSION;  Surgeon: Dionisio David, MD;  Location: [facility] ORS;  Service: Cardiovascular;  Laterality: N/A;   IR CATHETER TUBE CHANGE  05/05/2021   IR CHOLANGIOGRAM EXISTING TUBE  08/18/2020   IR PERC CHOLECYSTOSTOMY  07/22/2020   IR REMOVAL BILIARY DRAIN  01/20/2021    SOCIAL HISTORY:   Social History   Tobacco Use   Smoking status: Every Day    Packs/day: 1.00    Years: 64.00    Pack years: 64.00    Types: Cigarettes    Start date:  12/10/1956   Smokeless tobacco: Never  Substance Use Topics   Alcohol use: Not Currently    Comment: quit 1979; 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    FAMILY HISTORY:   Family History  Problem Relation Age of Onset   Heart disease Mother    Heart attack Father     DRUG ALLERGIES:   Allergies  Allergen Reactions   Atorvastatin    Lipitor [Atorvastatin Calcium] Other (See Comments)    arthralgia    REVIEW OF SYSTEMS:   ROS As per history of present illness. All pertinent systems were reviewed above. Constitutional, HEENT, cardiovascular, respiratory, GI, GU, musculoskeletal, neuro, psychiatric, endocrine, integumentary and hematologic systems were reviewed and are otherwise negative/unremarkable except for positive findings mentioned above in the HPI.   MEDICATIONS AT HOME:   Prior to Admission medications   Medication Sig Start Date End Date Taking? Authorizing Provider  acetaminophen (TYLENOL) 500 MG tablet Take 2 tablets (1,000 mg total) by mouth every 6 (six) hours as needed. 06/22/21 06/22/22  Tylene Fantasia, PA-C  ADVAIR HFA [phone] MCG/ACT inhaler Inhale 1 puff into the lungs 2 (two) times daily as needed.  12/17/19   [provider]  amiodarone (PACERONE) 200 MG tablet Take 2 tablets (400 mg total) by mouth 2 (two) times daily. Patient taking differently: Take 200 mg by mouth daily. 01/28/20   Loletha Grayer, MD  ibuprofen (ADVIL) 800 MG tablet Take 1 tablet (800 mg total) by mouth every 8 (eight) hours as needed. 06/09/21   Fredirick Maudlin, MD  metoprolol succinate (TOPROL-XL) 100 MG 24 hr tablet Take 100 mg by mouth daily. 05/11/21   [provider]  ondansetron (ZOFRAN ODT) 4 MG disintegrating tablet Take 1 tablet (4 mg total) by mouth every 8 (eight) hours as needed for nausea or vomiting. 07/17/20   Paulette Blanch, MD  oxyCODONE (OXY IR/ROXICODONE) 5 MG immediate release tablet Take 1 tablet (5 mg total) by mouth every 6 (six) hours as needed for severe pain. 06/09/21    Fredirick Maudlin, MD  OXYGEN Inhale 3 L into the lungs daily as needed.    [provider]  Rivaroxaban (XARELTO) 15 MG TABS tablet Take 15 mg by mouth daily with supper.    [provider]  sacubitril-valsartan (ENTRESTO) 49-51 MG Take 1 tablet by mouth 2 (two) times daily.    [provider]  spironolactone (ALDACTONE) 25 MG tablet Take 25 mg by mouth daily.    [provider]  torsemide (DEMADEX) 20 MG tablet Take 20 mg by mouth daily.    [provider]  traMADol (ULTRAM) 50 MG tablet Take 50 mg by mouth 2 (two) times daily as needed. 06/06/21   [provider]  VITAL SIGNS:  Blood pressure (!) 149/118, pulse 77, temperature 97.9 F (36.6 C), temperature source Oral, resp. rate (!) 25, height '6\' 1"'$  (1.854 m), weight 131.5 kg, SpO2 97 %.  PHYSICAL EXAMINATION:  Physical Exam  GENERAL:  80 y.o.-year-old Caucasian male patient lying in the bed with no acute distress.  EYES: Pupils equal, round, reactive to light and accommodation. No scleral icterus. Extraocular muscles intact.  HEENT: Head atraumatic, normocephalic. Oropharynx and nasopharynx clear.  NECK:  Supple, no jugular venous distention. No thyroid enlargement, no tenderness.  LUNGS: Normal breath sounds bilaterally, no wheezing, rales,rhonchi or crepitation. No use of accessory muscles of respiration.  CARDIOVASCULAR: Irregularly irregular tachycardic, S1, S2 normal. No murmurs, rubs, or gallops.  ABDOMEN: Soft, nondistended, nontender. Bowel sounds present. No organomegaly or mass.  EXTREMITIES: No pedal edema, cyanosis, or clubbing.  NEUROLOGIC: Cranial nerves II through XII are intact. Muscle strength 5/5 in all extremities. Sensation intact. Gait not checked.  PSYCHIATRIC: The patient is alert and oriented x 3.  Normal affect and good eye contact. SKIN: No obvious rash, lesion, or ulcer.   LABORATORY PANEL:   CBC Recent Labs  Lab 08/17/21 1924  WBC 10.4  HGB  18.1*  HCT 54.8*  PLT 242   ------------------------------------------------------------------------------------------------------------------  Chemistries  Recent Labs  Lab 08/17/21 1921 08/17/21 1924  NA  --  138  K  --  3.6  CL  --  101  CO2  --  28  GLUCOSE  --  120*  BUN  --  11  CREATININE  --  1.05  CALCIUM  --  9.3  AST 15  --   ALT 15  --   ALKPHOS 76  --   BILITOT 1.0  --    ------------------------------------------------------------------------------------------------------------------  Cardiac Enzymes No results for input(s): TROPONINI in the last 168 hours. ------------------------------------------------------------------------------------------------------------------  RADIOLOGY:  DG Chest 2 View  Result Date: 08/17/2021 CLINICAL DATA:  Chest pain EXAM: CHEST - 2 VIEW COMPARISON:  07/17/2020 FINDINGS: Stable chronic interstitial thickening. No confluent pulmonary infiltrate. No pneumothorax or pleural effusion. Cardiac size is mildly enlarged, unchanged. Osseous structures are age-appropriate. IMPRESSION: No active cardiopulmonary disease.  Stable cardiomegaly. Electronically Signed   By: Fidela Salisbury M.D.   On: 08/17/2021 20:06   CT Angio Chest PE W and/or Wo Contrast  Result Date: 08/18/2021 CLINICAL DATA:  Chest pain EXAM: CT ANGIOGRAPHY CHEST WITH CONTRAST TECHNIQUE: Multidetector CT imaging of the chest was performed using the standard protocol during bolus administration of intravenous contrast. Multiplanar CT image reconstructions and MIPs were obtained to evaluate the vascular anatomy. CONTRAST:  149m OMNIPAQUE IOHEXOL 350 MG/ML SOLN COMPARISON:  None. FINDINGS: Cardiovascular: No filling defects in the pulmonary arteries to suggest pulmonary emboli. Mild cardiomegaly. Diffuse coronary artery calcifications and aortic calcifications. No aneurysm. Mediastinum/Nodes: No mediastinal, hilar, or axillary adenopathy. Trachea and esophagus are unremarkable.  Lungs/Pleura: Linear scarring or atelectasis in the lung bases. No effusions. No confluent airspace opacities. Calcified granuloma in the right lower lobe. Upper Abdomen: Imaging into the upper abdomen demonstrates no acute findings. Musculoskeletal: Chest wall soft tissues are unremarkable. No acute bony abnormality. Review of the MIP images confirms the above findings. IMPRESSION: No evidence of pulmonary embolus. Cardiomegaly, diffuse coronary artery disease. No acute cardiopulmonary disease. Aortic Atherosclerosis (ICD10-I70.0). Electronically Signed   By: KRolm BaptiseM.D.   On: 08/18/2021 00:05   CT ABDOMEN PELVIS W CONTRAST  Result Date: 08/18/2021 CLINICAL DATA:  Epigastric pain, nausea, vomiting EXAM: CT ABDOMEN AND PELVIS WITH  CONTRAST TECHNIQUE: Multidetector CT imaging of the abdomen and pelvis was performed using the standard protocol following bolus administration of intravenous contrast. CONTRAST:  187m OMNIPAQUE IOHEXOL 350 MG/ML SOLN COMPARISON:  03/05/2019 FINDINGS: Lower chest: Scarring or atelectasis in the lung bases. Coronary artery and aortic calcifications. Hepatobiliary: No focal liver abnormality is seen. Status post cholecystectomy. No biliary dilatation. Pancreas: No focal abnormality or ductal dilatation. Spleen: No focal abnormality.  Normal size. Adrenals/Urinary Tract: 2.5 cm rim calcified cyst in the upper pole of the left kidney. Other scattered cysts, the largest on the right measure up to 4.9 cm. No hydronephrosis. Adrenal glands and urinary bladder unremarkable. Stomach/Bowel: Diffuse colonic diverticulosis. No active diverticulitis. Stomach and small bowel decompressed, unremarkable. Vascular/Lymphatic: 3.5 cm infrarenal abdominal aortic aneurysm. Diffuse aortic atherosclerosis. No adenopathy. Reproductive: No visible focal abnormality. Other: No free fluid or free air. Musculoskeletal: No acute bony abnormality. IMPRESSION: Diffuse colonic diverticulosis.  No active  diverticulitis. No acute findings in the abdomen or pelvis. 3.5 cm infrarenal abdominal aortic aneurysm. Recommend follow-up ultrasound every 2 years. This recommendation follows ACR consensus guidelines: White Paper of the ACR Incidental Findings Committee II on Vascular Findings. J Am Coll Radiol 2013; 10:789-794. Electronically Signed   By: KRolm BaptiseM.D.   On: 08/18/2021 00:09      IMPRESSION AND PLAN:  Active Problems:   Atrial fibrillation with RVR (HCC)  1.  Atrial fibrillation with rapid ventricular response. - The patient will be admitted to a progressive unit bed. - We will continue him on IV Cardizem drip. - We will continue p.o. amiodarone. - We will obtain a cardiology consult in AM. - Last 2D echo in February 2021 showed an EF of 60 to 65% with indeterminate diastolic function. - I notified Dr. CClayborn Bignessabout the patient.  2.  Essential hypertension. - We will continue Toprol-XL and Entresto.  3.  Compensated systolic CHF. - We will continue Entresto and Aldactone as well as beta-blocker as mentioned above as well as Demadex.  4.  COPD without exacerbation. - We will place the patient on as needed DuoNebs. - We will hold off Advair Diskus.  5.  Ongoing tobacco abuse. - I counseled him for smoking cessation and he will receive further counseling here.  DVT prophylaxis: Xarelto. Code Status: full code. Family Communication:  The plan of care was discussed in details with the patient (and family). I answered all questions. The patient agreed to proceed with the above mentioned plan. Further management will depend upon hospital course. Disposition Plan: Back to previous home environment Consults called: none. All the records are reviewed and case discussed with ED provider.  Status is: Inpatient  Remains inpatient appropriate because:Ongoing diagnostic testing needed not appropriate for outpatient work up, Unsafe d/c plan, IV treatments appropriate due to intensity  of illness or inability to take PO, and Inpatient level of care appropriate due to severity of illness  Dispo: The patient is from: Home              Anticipated d/c is to: Home              Patient currently is not medically stable to d/c.   Difficult to place patient No   TOTAL TIME TAKING CARE OF THIS PATIENT: 55 minutes.    JChristel MormonM.D on 08/18/2021 at 3:41 AM  [facility]   From 7 PM-7 AM, contact night-coverage [email or web address]  CC: Primary care physician; KPerrin Maltese MD

## 2021-09-29 ENCOUNTER — Encounter: Payer: Self-pay | Admitting: General Surgery

## 2022-03-26 IMAGING — XA IR CHOLANGIOGRAM VIA EXIST CATHETER
5 series · 5 of 5 positions shown · non-contrast
Comparison: Image guided cholecystostomy tube placement-07/22/2020;

INDICATION: History of acute cholecystitis, post ultrasound fluoroscopic guided
cholecystostomy tube placement on 07/22/2020 (Dr. Yesenia).

Patient presents today for cholangiogram via the existing
cholecystostomy tube.
Patient is tolerated the cholecystostomy tube well and is without
complaint.
EXAM:
FLUOROSCOPIC GUIDED CHOLECYSTOSTOMY TUBE INJECTION

[Series 1: fl (-) angio · 1 of 1 slices shown (1 of 5)]
[im 1/1]
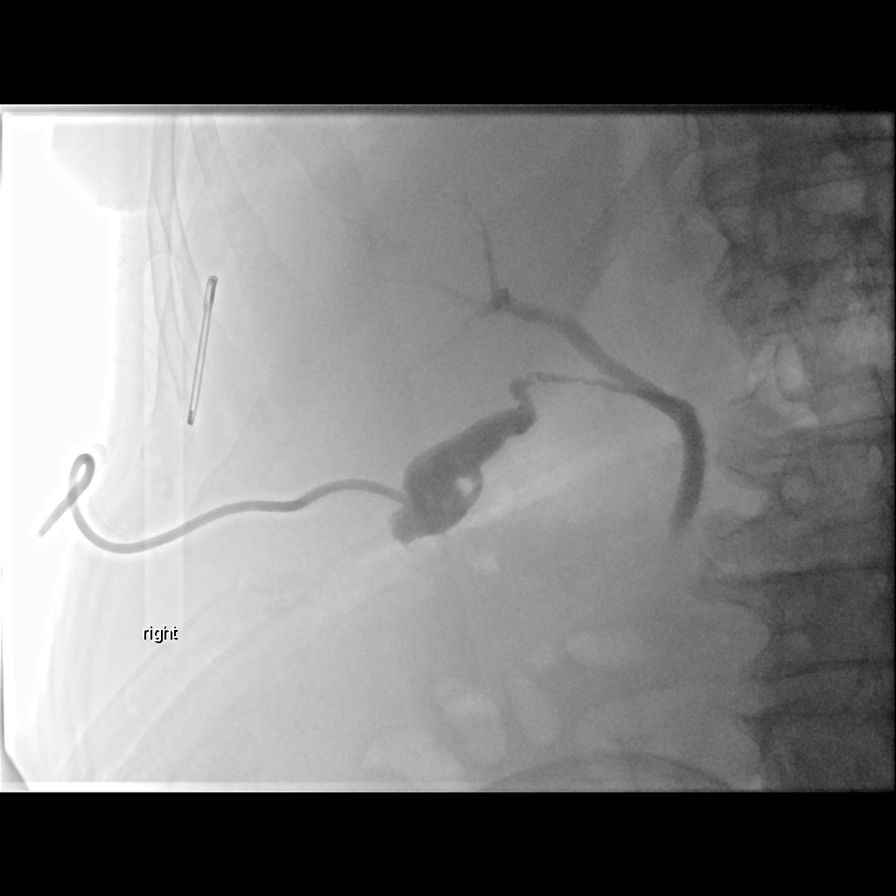

[Series 2: fl (-) angio · 1 of 1 slices shown (2 of 5)]
[im 1/1]
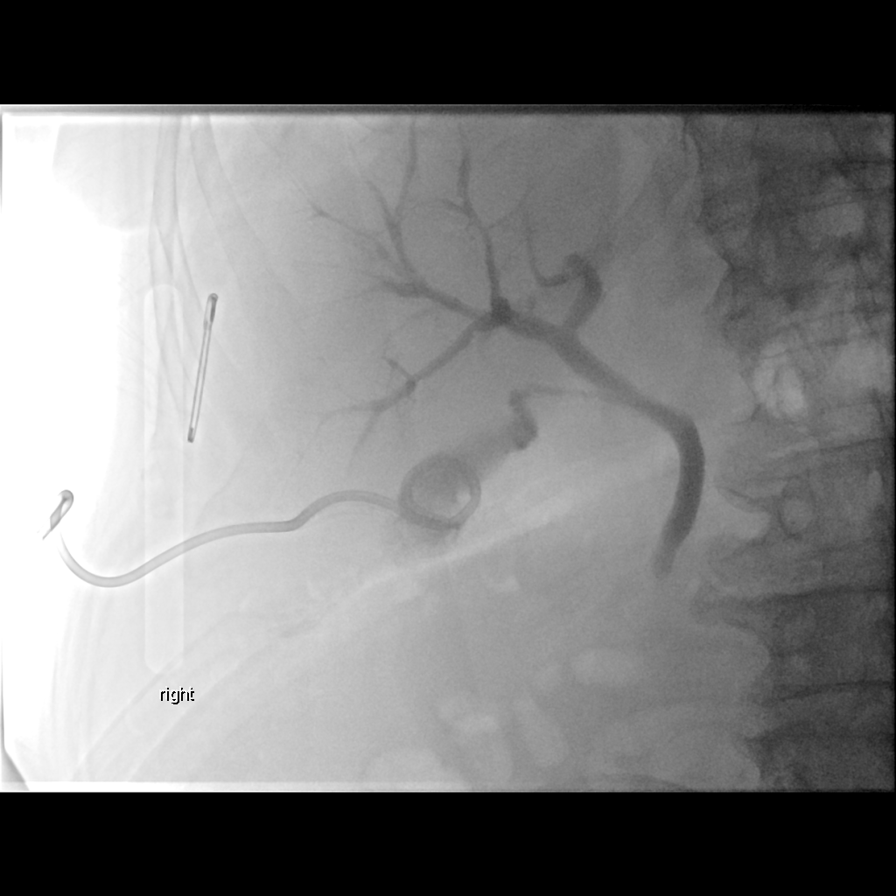

[Series 3: fl (-) angio · 1 of 1 slices shown (3 of 5)]
[im 1/1]
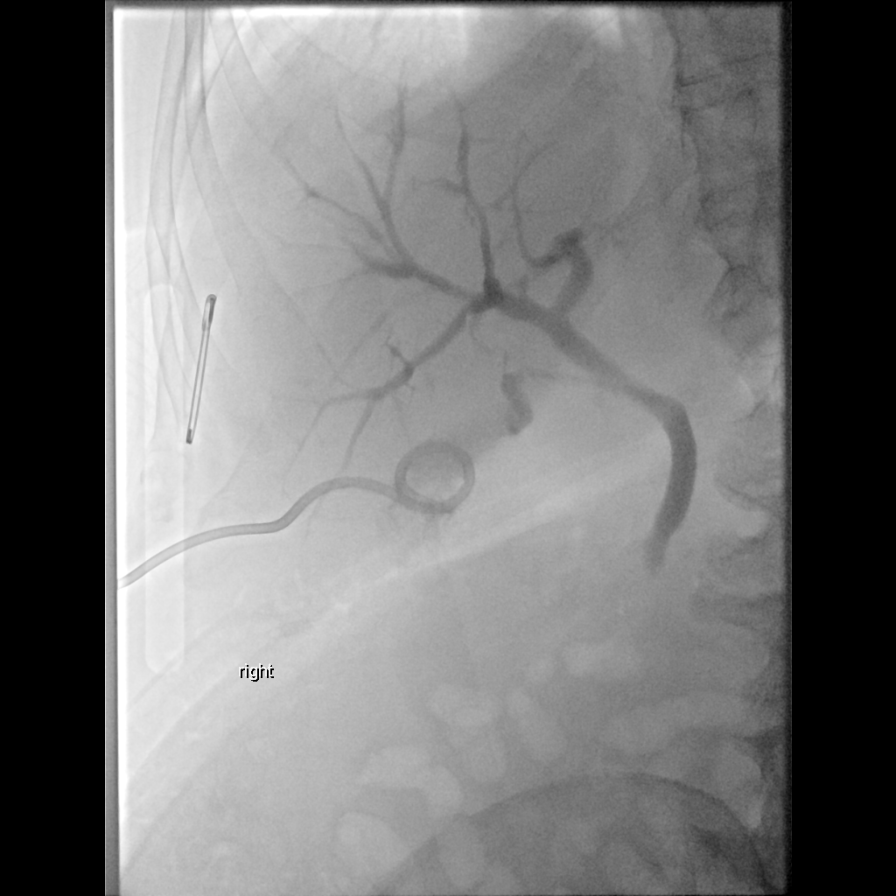

[Series 4: fl (-) angio · 1 of 1 slices shown (4 of 5)]
[im 1/1]
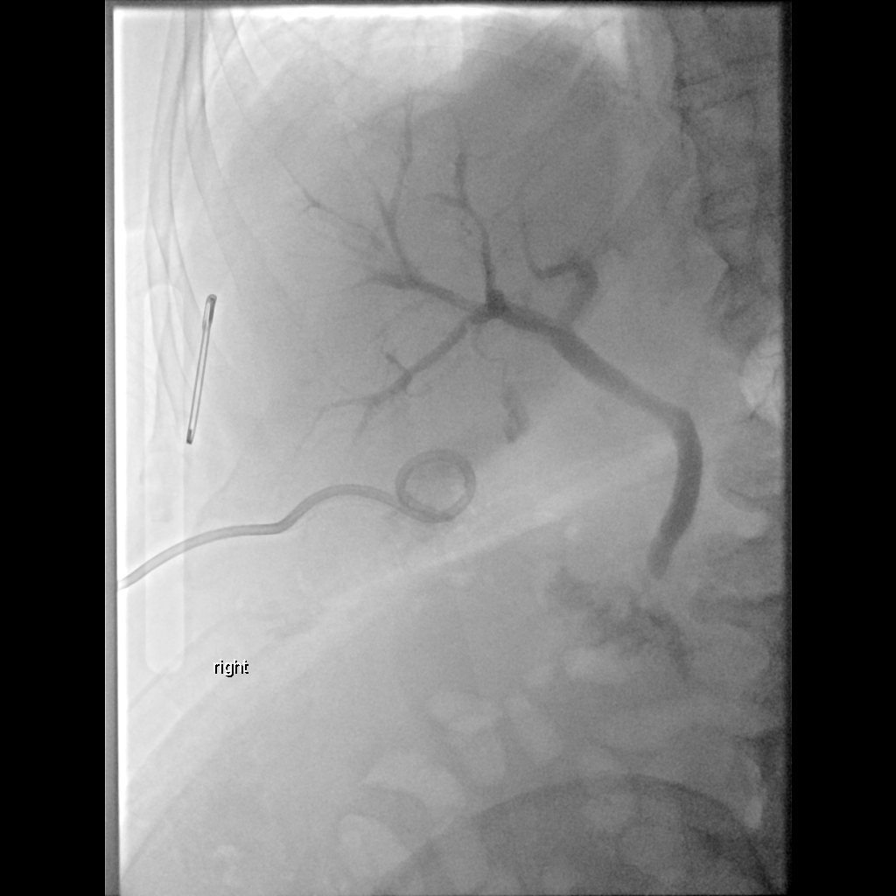

[Series 5: fl (-) angio · 1 of 1 slices shown (5 of 5)]
[im 1/1]
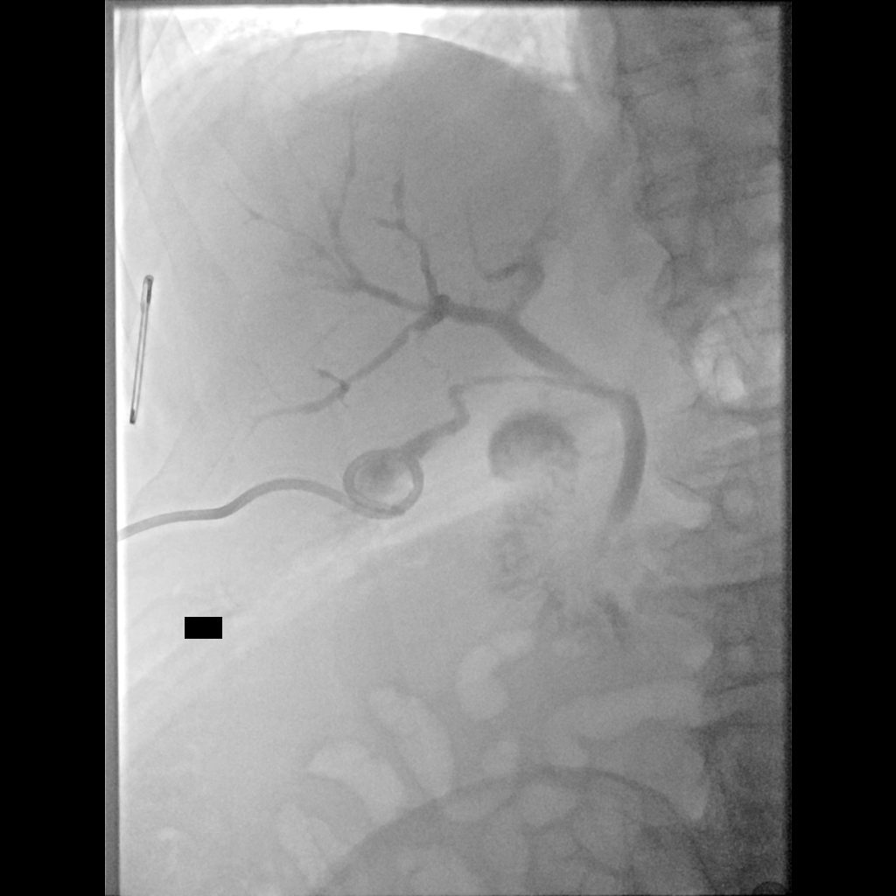

[5 of 5 positions shown; findings below may reference images not displayed]

MRCP-07/21/2020

MEDICATIONS:
None

ANESTHESIA/SEDATION:
None

CONTRAST:  10mL OMNIPAQUE IOHEXOL 300 MG/ML SOLN - administered into
the gallbladder lumen.

FLUOROSCOPY TIME:  36 seconds (12 mGy)

COMPLICATIONS:
None immediate.

PROCEDURE:
The patient was positioned supine on the fluoroscopy table.

A preprocedural spot fluoroscopic image was obtained of the right
upper abdominal quadrant existing cholecystostomy tube.

Multiple spot fluoroscopic radiographic images were obtained of the
right upper abdominal quadrant an existing cholecystostomy tube
following injection of a small amount of contrast. Images were
reviewed and discussed with the patient.

The cholecystostomy tube was flushed with a small amount of saline
and capped.

As the retention suture had been retracted, the skin surrounding the
exit site of the cholecystostomy tube was prepped and draped in
usual sterile fashion. After the overlying soft tissues were
anesthetized with 1% lidocaine, a single retention suture was
reapplied. A dressing was applied. The patient tolerated the
procedure well without immediate postprocedural complication.
FINDINGS: Preprocedural spot fluoroscopic image of the right upper abdominal
quadrant demonstrates grossly unchanged positioning of the
cholecystostomy tube with end coiled and locked overlying the
expected location of the gallbladder fundus.

Subsequent contrast injection demonstrates appropriate functionality
of the cholecystostomy tube with brisk opacification of the
gallbladder. There is passage of contrast from the gallbladder
through the cystic and common bile ducts the level duodenum.

There is a persistent filling defect within the gallbladder lumen
compatible with known cholelithiasis. There are no discrete
intraluminal filling defect in the pacified portion of the cystic or
common bile ducts to suggest the presence of choledocholithiasis.
IMPRESSION: 1. Appropriately positioned and functioning cholecystostomy tube. No
exchange performed.
2. Cholelithiasis without evidence of choledocholithiasis.

PLAN:
- the patient's cholecystostomy tube was capped for a trial of
internalization. The patient was instructed to no longer flush the
cholecystostomy tube.

- the patient was given an extra gravity bag and instructed to
reconnect the cholecystostomy tube to the gravity bag if she were to
experience recurrent right upper quadrant obstructive symptoms. The
patient demonstrated fair understanding of this discussion.

- the patient will return to [HOSPITAL] next
week for repeat cholangiogram. As patient is a poor operative
candidate, per my discussion with referring surgeon, Dr. Julien, if
the patient passes his trial of internalization, the cholecystostomy
tube may be removed.

The patient was instructed to call the [REDACTED] with any interval questions or concerns.

## 2023-02-13 ENCOUNTER — Other Ambulatory Visit: Payer: Self-pay | Admitting: Family

## 2023-03-15 ENCOUNTER — Ambulatory Visit (INDEPENDENT_AMBULATORY_CARE_PROVIDER_SITE_OTHER): Payer: Medicare Other | Admitting: Cardiovascular Disease

## 2023-03-15 ENCOUNTER — Encounter: Payer: Self-pay | Admitting: Cardiovascular Disease

## 2023-03-15 VITALS — BP 140/88 | HR 79 | Ht 73.0 in | Wt 266.6 lb

## 2023-03-15 DIAGNOSIS — I482 Chronic atrial fibrillation, unspecified: Secondary | ICD-10-CM

## 2023-03-15 DIAGNOSIS — I5022 Chronic systolic (congestive) heart failure: Secondary | ICD-10-CM | POA: Diagnosis not present

## 2023-03-15 DIAGNOSIS — E782 Mixed hyperlipidemia: Secondary | ICD-10-CM

## 2023-03-15 DIAGNOSIS — I2510 Atherosclerotic heart disease of native coronary artery without angina pectoris: Secondary | ICD-10-CM

## 2023-03-15 DIAGNOSIS — I10 Essential (primary) hypertension: Secondary | ICD-10-CM | POA: Diagnosis not present

## 2023-03-15 NOTE — Assessment & Plan Note (Signed)
Patient doing well. Denies chest pain. Continues to smoke, no desire to quit. Shortness of breath unchanged.

## 2023-03-15 NOTE — Progress Notes (Signed)
Cardiology Office Note   Date:  03/15/2023   ID:  Stanley Marks, DOB 12/11/1940, MRN [id number]  PCP:  Margaretann Loveless, MD  Cardiologist:  Adrian Blackwater, MD      History of Present Illness: Stanley Marks is a 82 y.o. male who presents for  Chief Complaint  Patient presents with   Follow-up    3 month follow up    Patient in office for routine cardiac exam. Denies chest pain, shortness of breath, edema, palpitations.     Past Medical History:  Diagnosis Date   Aneurysm of infrarenal abdominal aorta    a.) measured 3.6 cm on 03/05/2019 CT; b.) measured 3.2 cm on 07/21/2020 MRI   Aortic atherosclerosis    Atrial fibrillation    on Xarelto   Basal cell carcinoma 08/24/2014   chin, L nose ant alar crease   Bilateral renal cysts    CAD (coronary artery disease)    Cardiomegaly    CHF (congestive heart failure)    Chronic anticoagulation    Rivaroxaban   CKD (chronic kidney disease)    COPD (chronic obstructive pulmonary disease)    Diverticulosis    Emphysema of lung    Hepatic steatosis    History of 2019 novel coronavirus disease (COVID-19) 12/21/2019   Repeat testing on 01/22/2020 remained (+)   HLD (hyperlipidemia)    Hypertension    Obesity (BMI 30-39.9)    Obstructive sleep apnea    Pneumonia due to COVID-19 virus 01/22/2020   PVD (peripheral vascular disease)    Squamous cell carcinoma of skin 09/07/2014, 03/23/2020   L dorsal hand   Squamous cell carcinoma of skin 01/17/2015   R prox lateral dorsum of hand     Past Surgical History:  Procedure Laterality Date   APPENDECTOMY     arm surgery     ELECTROPHYSIOLOGIC STUDY N/A 11/20/2016   Procedure: CARDIOVERSION;  Surgeon: Laurier Nancy, MD;  Location: [facility] ORS;  Service: Cardiovascular;  Laterality: N/A;   IR CATHETER TUBE CHANGE  05/05/2021   IR CHOLANGIOGRAM EXISTING TUBE  08/18/2020   IR PERC CHOLECYSTOSTOMY  07/22/2020   IR REMOVAL BILIARY DRAIN  01/20/2021     Current Outpatient Medications   Medication Sig Dispense Refill   amiodarone (PACERONE) 200 MG tablet Take 2 tablets (400 mg total) by mouth 2 (two) times daily. (Patient taking differently: Take 200 mg by mouth daily.) 120 tablet 0   ibuprofen (ADVIL) 800 MG tablet Take 1 tablet (800 mg total) by mouth every 8 (eight) hours as needed. 30 tablet 0   metoprolol succinate (TOPROL-XL) 100 MG 24 hr tablet Take 100 mg by mouth daily.     ondansetron (ZOFRAN-ODT) 4 MG disintegrating tablet DISSOLVE 1 TABLET UNDER THE TONGUE THREE TIMES DAILY AS NEEDED FOR NAUSEA 90 tablet 1   Rivaroxaban (XARELTO) 15 MG TABS tablet Take 15 mg by mouth daily with supper.     sacubitril-valsartan (ENTRESTO) 49-51 MG Take 1 tablet by mouth 2 (two) times daily.     spironolactone (ALDACTONE) 25 MG tablet Take 25 mg by mouth daily as needed (fluid).     torsemide (DEMADEX) 20 MG tablet Take 20 mg by mouth daily.     traMADol (ULTRAM) 50 MG tablet Take 50 mg by mouth 2 (two) times daily as needed for moderate pain.     No current facility-administered medications for this visit.    Allergies:   Atorvastatin and Lipitor [atorvastatin calcium]    Social History:  reports that he has been smoking cigarettes. He started smoking about 66 years ago. He has a 64.00 pack-year smoking history. He has never used smokeless tobacco. He reports that he does not currently use alcohol. He reports that he does not use drugs.   Family History:  family history includes Heart attack in his father; Heart disease in his mother.    ROS:     Review of Systems  Constitutional: Negative.   HENT: Negative.    Eyes: Negative.   Respiratory: Negative.    Cardiovascular: Negative.   Gastrointestinal: Negative.   Genitourinary: Negative.   Musculoskeletal: Negative.   Skin: Negative.   Neurological: Negative.   Endo/Heme/Allergies: Negative.   Psychiatric/Behavioral: Negative.    All other systems reviewed and are negative.   All other systems are reviewed and  negative.   PHYSICAL EXAM: VS:  BP (!) 140/88   Pulse 79   Ht 6\' 1"  (1.854 m)   Wt 266 lb 9.6 oz (120.9 kg)   SpO2 95%   BMI 35.17 kg/m  , BMI Body mass index is 35.17 kg/m. Last weight:  Wt Readings from Last 3 Encounters:  03/15/23 266 lb 9.6 oz (120.9 kg)  08/17/21 290 lb (131.5 kg)  06/22/21 295 lb (133.8 kg)   Physical Exam Vitals reviewed.  Constitutional:      Appearance: Normal appearance. He is normal weight.  HENT:     Head: Normocephalic.     Nose: Nose normal.     Mouth/Throat:     Mouth: Mucous membranes are moist.  Eyes:     Pupils: Pupils are equal, round, and reactive to light.  Cardiovascular:     Rate and Rhythm: Normal rate. Rhythm irregular.     Pulses: Normal pulses.     Heart sounds: Normal heart sounds.  Pulmonary:     Effort: Pulmonary effort is normal.  Abdominal:     General: Abdomen is flat. Bowel sounds are normal.  Musculoskeletal:        General: Normal range of motion.     Cervical back: Normal range of motion.  Skin:    General: Skin is warm.  Neurological:     General: No focal deficit present.     Mental Status: He is alert.  Psychiatric:        Mood and Affect: Mood normal.     EKG: none today  Recent Labs: No results found for requested labs within last 365 days.    Lipid Panel    Component Value Date/Time   TRIG 53 12/22/2019 0151      Other studies Reviewed: Patient: [id number] - Stanley Marks DOB:  29-Nov-1941  Date:  10/02/2022 10:30 Provider: Adrian Blackwater MD Encounter: ECHO   Page 2 REASON FOR VISIT  Visit for: Echocardiogram/I48.91  Sex:   Male          wt=264    lbs.  BP=114/68  Height= 73   inches.   TESTS  Imaging: Echocardiogram:  An echocardiogram in (2-d) mode was performed and in Doppler mode with color flow velocity mapping was performed. The aortic valve cusps are abnormal 2.1   cm, flow velocity 1.32  m/s, and systolic calculated mean flow gradient 5  mmHg. Aortic root diameter 3.6   cm. The LVOT internal diameter 3.7 cm and flow velocity was abnormal 1.12  m/s. LV systolic dimension 2.95   cm, diastolic 4.34   cm, posterior wall thickness 1.52   cm, fractional shortening 32 %, and  EF 60.4  %. IVS thickness 1.32  cm. LA dimension 6.5 cm. Mitral Valve has Mild to Moderate Regurgitation. Tricuspid Valve has Trace  Regurgitation.     ASSESSMENT  Technically difficult study due to body habitus.  Normal left ventricular systolic function.  Indertermind left ventricular hypertrophy with GRADE 1 2 3  (relaxation abnormality psuedonormalization restrictive physiology) diastolic dysfunction.  Normal right ventricular systolic function.  Normal right ventricular diastolic function.  Normal left ventricular wall motion.  Normal right ventricular wall motion.  Mild tricuspid regurgitation.  Mild pulmonary hypertension.  Mild to Moderate mitral regurgitation.  No pericardial effusion.  Severely dilated Left atrium  Moderately dilated Right atrium  Moderately dilated Left and Right ventricle  Mild LVH.   THERAPY   Referring physician: Laurier NancyShaukat A. Ryna Beckstrom  Sonographer: Adrian BlackwaterShaukat Dosia Yodice.   Adrian BlackwaterShaukat Emilie Carp MD  Electronically signed by: Adrian BlackwaterSHAUKAT Clemons Salvucci     Date: 10/03/2022 11:24  Patient: [id number] - Stanley F. Funk DOB:  08/22/1941  Date:  05/12/2021 07:30 Provider: Adrian BlackwaterKhan, Ellis Mehaffey MD Encounter: NUCLEAR STRESS TEST   Page 1 TESTS    [facility] [address], [address] [place], [place] [id number] [phone] STUDY:  Gated Stress / Rest Myocardial Perfusion Imaging Tomographic (SPECT) Including attenuation correction Wall Motion, Left Ventricular Ejection Fraction By Gated Technique. Adenosine Stress Test. SEX:  Male   WEIGHT: 290 lbs   HEIGHT: 73 in       ARMS UP: YES/NO                                                                                                                                                                                REFERRING PHYSICIAN:  Dr.Harrie Cazarez Welton FlakesKhan  INDICATION FOR STUDY: CP                                                                                                                                                                                                                    TECHNIQUE:  Approximately 20 minutes following the intravenous administration of 9.9 mCi of Tc-59m Sestamibi after stress testing in a reclined supine position with arms above their head if able to do so, gated SPECT imaging of the heart was performed. After about a 2hr break, the patient was injected intravenously with 31.6 mCi of Tc-36m Sestamibi.  Approximately 45 minutes later in the same position as stress imaging SPECT rest imaging of the heart was performed.  STRESS BY:  Adrian Blackwater, MD PROTOCOL:  Adenosine   DOSE ADMIN: 20 ml    ROUTE OF ADMINISTRATION: IV                                                                            MAX PRED HR: 143                     85%: 122               75%: 107                                                                                                                   RESTING BP: 122/70   RESTING HR: 73  PEAK BP: 120/74  PEAK HR: 89  EXERCISE DURATION:    4 min injection                                            REASON FOR TEST TERMINATION:    Protocol end                                                                                                                              SYMPTOMS:   None                                                                                                                                                                                                          EKG RESULTS: A-Fib. 73/min. Non specific  ST/T wave changes, no acute changes with adenosine.                                                             IMAGE QUALITY:   Good  PERFUSION/WALL MOTION FINDINGS: EF = 52%. No perfusion defects, mildly dilated LV. Mild anteroseptal wall hypokinesis.                                                                           IMPRESSION: Normal stress test with borderline LV dysfunction.                                                                                                                                                                                                                                                                                        Adrian BlackwaterShaukat Cassi Jenne, MD Stress Interpreting Physician / Nuclear Interpreting Physician        Adrian BlackwaterShaukat Gladine Plude MD  Electronically signed by: Adrian BlackwaterSHAUKAT Oney Folz     Date: 05/15/2021 10:54  Patient: [id number] - Stanley F. Myren DOB:  [birth year]  Date:  07/08/2019 09:30 Provider: Adrian BlackwaterKhan, Edgar Corrigan MD Encounter: ALL ANGIOGRAMS (CTA BRAIN, CAROTIDS, RENAL ARTERIES, PE)   Page 2 REASON FOR VISIT  Referred by Dr.Donnamaria Shands Welton FlakesKhan.    TESTS  Imaging: Computed Tomographic Angiography:  Cardiac multidetector CT was performed paying particular attention to the coronary arteries for the diagnosis of: CAD. Diagnostic Drugs:  Administered iohexol (Omnipaque) through an antecubital vein and images from the examination were analyzed for the presence and extent of coronary artery disease, using 3D image processing software. 100 mL of non-ionic contrast (Omnipaque) was used.    ASSESSMENT   The proximal right coronary artery (RCA) was abnormal:2  The mid right  coronary artery (RCA) was abnormal:2  The distal right coronary artery (RCA) was abnormal:2  The posterior descending coronary arteries were abnormal:2   TEST CONCLUSIONS  Quality of study: Fair.  1-Calcium score: 4697.2  2-Right dominant system.  3-Severely calcified RCA with no obstructive disease. Severely calcified LAD with moderate proximal disease. Osteal LCX 60% disease with severe clacification.   Malyssa Maris Welton FlakesKhan  MD  Electronically signed by: Adrian Blackwater     Date: 07/10/2019 09:17   ASSESSMENT AND PLAN:    ICD-10-CM   1. Athscl heart disease of native coronary artery w/o ang pctrs  I25.10     2. Atrial fibrillation, chronic  I48.20     3. Chronic systolic CHF (congestive heart failure)  I50.22     4. Essential hypertension  I10     5. Mixed hyperlipidemia  E78.2        Problem List Items Addressed This Visit       Cardiovascular and Mediastinum   Chronic systolic CHF (congestive heart failure)   Atrial fibrillation, chronic   Essential hypertension   Athscl heart disease of native coronary artery w/o ang pctrs - Primary    Patient doing well. Denies chest pain. Continues to smoke, no desire to quit. Shortness of breath unchanged.         Other   Mixed hyperlipidemia       Disposition:   Return in about 4 months (around 07/15/2023).    Total time spent: 30 minutes  Signed,  Adrian Blackwater, MD  03/15/2023 9:38 AM    [facility]

## 2023-06-24 ENCOUNTER — Other Ambulatory Visit: Payer: Self-pay | Admitting: Family

## 2023-06-24 ENCOUNTER — Other Ambulatory Visit: Payer: Self-pay | Admitting: Cardiovascular Disease

## 2023-07-02 ENCOUNTER — Other Ambulatory Visit: Payer: Self-pay | Admitting: Cardiovascular Disease

## 2023-07-15 ENCOUNTER — Ambulatory Visit (INDEPENDENT_AMBULATORY_CARE_PROVIDER_SITE_OTHER): Payer: Medicare Other | Admitting: Cardiovascular Disease

## 2023-07-15 ENCOUNTER — Encounter: Payer: Self-pay | Admitting: Cardiovascular Disease

## 2023-07-15 VITALS — BP 116/68 | HR 69 | Ht 73.0 in | Wt 270.0 lb

## 2023-07-15 DIAGNOSIS — I739 Peripheral vascular disease, unspecified: Secondary | ICD-10-CM

## 2023-07-15 DIAGNOSIS — F17209 Nicotine dependence, unspecified, with unspecified nicotine-induced disorders: Secondary | ICD-10-CM | POA: Diagnosis not present

## 2023-07-15 DIAGNOSIS — I5022 Chronic systolic (congestive) heart failure: Secondary | ICD-10-CM | POA: Diagnosis not present

## 2023-07-15 DIAGNOSIS — I10 Essential (primary) hypertension: Secondary | ICD-10-CM | POA: Diagnosis not present

## 2023-07-15 DIAGNOSIS — I2510 Atherosclerotic heart disease of native coronary artery without angina pectoris: Secondary | ICD-10-CM | POA: Diagnosis not present

## 2023-07-15 DIAGNOSIS — I482 Chronic atrial fibrillation, unspecified: Secondary | ICD-10-CM | POA: Diagnosis not present

## 2023-07-15 MED ORDER — SACUBITRIL-VALSARTAN 49-51 MG PO TABS: 1.0000 | ORAL_TABLET | Freq: Two times a day (BID) | ORAL | 2 refills | Status: DC

## 2023-07-15 NOTE — Progress Notes (Signed)
Cardiology Office Note   Date:  07/15/2023   ID:  Stanley Marks, DOB 02-28-1941, MRN [id number]  PCP:  Margaretann Loveless, MD  Cardiologist:  Adrian Blackwater, MD      History of Present Illness: Stanley Marks is a 82 y.o. male who presents for  Chief Complaint  Patient presents with   Follow-up    4 Months Follow up    Feeling fine  Shortness of Breath This is a chronic problem. The current episode started more than 1 year ago. The problem has been unchanged.      Past Medical History:  Diagnosis Date   Aneurysm of infrarenal abdominal aorta (HCC)    a.) measured 3.6 cm on 03/05/2019 CT; b.) measured 3.2 cm on 07/21/2020 MRI   Aortic atherosclerosis (HCC)    Atrial fibrillation (HCC)    on Xarelto   Basal cell carcinoma 08/24/2014   chin, L nose ant alar crease   Bilateral renal cysts    CAD (coronary artery disease)    Cardiomegaly    CHF (congestive heart failure) (HCC)    Chronic anticoagulation    Rivaroxaban   CKD (chronic kidney disease)    COPD (chronic obstructive pulmonary disease) (HCC)    Diverticulosis    Emphysema of lung (HCC)    Hepatic steatosis    History of 2019 novel coronavirus disease (COVID-19) 12/21/2019   Repeat testing on 01/22/2020 remained (+)   HLD (hyperlipidemia)    Hypertension    Obesity (BMI 30-39.9)    Obstructive sleep apnea    Pneumonia due to COVID-19 virus 01/22/2020   PVD (peripheral vascular disease) (HCC)    Squamous cell carcinoma of skin 09/07/2014, 03/23/2020   L dorsal hand   Squamous cell carcinoma of skin 01/17/2015   R prox lateral dorsum of hand     Past Surgical History:  Procedure Laterality Date   APPENDECTOMY     arm surgery     ELECTROPHYSIOLOGIC STUDY N/A 11/20/2016   Procedure: CARDIOVERSION;  Surgeon: Laurier Nancy, MD;  Location: [facility] ORS;  Service: Cardiovascular;  Laterality: N/A;   IR CATHETER TUBE CHANGE  05/05/2021   IR CHOLANGIOGRAM EXISTING TUBE  08/18/2020   IR PERC CHOLECYSTOSTOMY   07/22/2020   IR REMOVAL BILIARY DRAIN  01/20/2021     Current Outpatient Medications  Medication Sig Dispense Refill   amiodarone (PACERONE) 200 MG tablet Take 1 tablet (200 mg total) by mouth daily. 30 tablet 2   ibuprofen (ADVIL) 800 MG tablet Take 1 tablet (800 mg total) by mouth every 8 (eight) hours as needed. 30 tablet 0   metoprolol succinate (TOPROL-XL) 100 MG 24 hr tablet TAKE 1 TABLET BY MOUTH EVERY DAY 90 tablet 0   ondansetron (ZOFRAN-ODT) 4 MG disintegrating tablet DISSOLVE 1 TABLET UNDER THE TONGUE THREE TIMES DAILY AS NEEDED FOR NAUSEA 90 tablet 1   Rivaroxaban (XARELTO) 15 MG TABS tablet Take 15 mg by mouth daily with supper.     sacubitril-valsartan (ENTRESTO) 49-51 MG Take 1 tablet by mouth 2 (two) times daily. 180 tablet 2   spironolactone (ALDACTONE) 25 MG tablet Take 25 mg by mouth daily as needed (fluid).     torsemide (DEMADEX) 20 MG tablet Take 20 mg by mouth daily.     traMADol (ULTRAM) 50 MG tablet Take 50 mg by mouth 2 (two) times daily as needed for moderate pain.     No current facility-administered medications for this visit.    Allergies:   Atorvastatin  and Lipitor [atorvastatin calcium]    Social History:   reports that he has been smoking cigarettes. He started smoking about 66 years ago. He has a 66.6 pack-year smoking history. He has never used smokeless tobacco. He reports that he does not currently use alcohol. He reports that he does not use drugs.   Family History:  family history includes Heart attack in his father; Heart disease in his mother.    ROS:     Review of Systems  Constitutional: Negative.   HENT: Negative.    Eyes: Negative.   Respiratory:  Positive for shortness of breath.   Gastrointestinal: Negative.   Genitourinary: Negative.   Musculoskeletal: Negative.   Skin: Negative.   Neurological: Negative.   Endo/Heme/Allergies: Negative.   Psychiatric/Behavioral: Negative.    All other systems reviewed and are negative.      All other systems are reviewed and negative.    PHYSICAL EXAM: VS:  BP 116/68   Pulse 69   Ht 6\' 1"  (1.854 m)   Wt 270 lb (122.5 kg)   SpO2 94%   BMI 35.62 kg/m  , BMI Body mass index is 35.62 kg/m. Last weight:  Wt Readings from Last 3 Encounters:  07/15/23 270 lb (122.5 kg)  03/15/23 266 lb 9.6 oz (120.9 kg)  08/17/21 290 lb (131.5 kg)     Physical Exam Vitals reviewed.  Constitutional:      Appearance: Normal appearance. He is normal weight.  HENT:     Head: Normocephalic.     Nose: Nose normal.     Mouth/Throat:     Mouth: Mucous membranes are moist.  Eyes:     Pupils: Pupils are equal, round, and reactive to light.  Cardiovascular:     Rate and Rhythm: Normal rate and regular rhythm.     Pulses: Normal pulses.     Heart sounds: Normal heart sounds.  Pulmonary:     Effort: Pulmonary effort is normal.  Abdominal:     General: Abdomen is flat. Bowel sounds are normal.  Musculoskeletal:        General: Normal range of motion.     Cervical back: Normal range of motion.  Skin:    General: Skin is warm.  Neurological:     General: No focal deficit present.     Mental Status: He is alert.  Psychiatric:        Mood and Affect: Mood normal.       EKG:   Recent Labs: No results found for requested labs within last 365 days.    Lipid Panel    Component Value Date/Time   TRIG 53 12/22/2019 0151      Other studies Reviewed: Additional studies/ records that were reviewed today include:  Review of the above records demonstrates:       No data to display            ASSESSMENT AND PLAN:    ICD-10-CM   1. Athscl heart disease of native coronary artery w/o ang pctrs  I25.10 sacubitril-valsartan (ENTRESTO) 49-51 MG    2. Atrial fibrillation, chronic (HCC)  I48.20 sacubitril-valsartan (ENTRESTO) 49-51 MG    3. Chronic systolic CHF (congestive heart failure) (HCC)  I50.22 sacubitril-valsartan (ENTRESTO) 49-51 MG    4. Essential hypertension   I10 sacubitril-valsartan (ENTRESTO) 49-51 MG    5. PVD (peripheral vascular disease) (HCC)  I73.9 sacubitril-valsartan (ENTRESTO) 49-51 MG       Problem List Items Addressed This Visit  Cardiovascular and Mediastinum   Chronic systolic CHF (congestive heart failure) (HCC)   Relevant Medications   sacubitril-valsartan (ENTRESTO) 49-51 MG   Atrial fibrillation, chronic (HCC)   Relevant Medications   sacubitril-valsartan (ENTRESTO) 49-51 MG   Essential hypertension   Relevant Medications   sacubitril-valsartan (ENTRESTO) 49-51 MG   PVD (peripheral vascular disease) (HCC)   Relevant Medications   sacubitril-valsartan (ENTRESTO) 49-51 MG   Athscl heart disease of native coronary artery w/o ang pctrs - Primary   Relevant Medications   sacubitril-valsartan (ENTRESTO) 49-51 MG       Disposition:   Return in about 3 months (around 10/15/2023).    Total time spent: 30 minutes  Signed,  Adrian Blackwater, MD  07/15/2023 9:39 AM    [facility]

## 2023-10-03 ENCOUNTER — Telehealth: Payer: Self-pay

## 2023-10-04 ENCOUNTER — Other Ambulatory Visit: Payer: Self-pay | Admitting: Cardiology

## 2023-10-04 MED ORDER — CIPROFLOXACIN HCL 500 MG PO TABS: 500.0000 mg | ORAL_TABLET | Freq: Two times a day (BID) | ORAL | 0 refills | Status: AC

## 2023-10-04 NOTE — Telephone Encounter (Signed)
Patient left VM requesting a call back. Did not state what he is needing.

## 2023-10-07 ENCOUNTER — Ambulatory Visit: Payer: Medicare Other | Admitting: Cardiology

## 2023-10-07 ENCOUNTER — Other Ambulatory Visit: Payer: Self-pay | Admitting: Family

## 2023-10-15 ENCOUNTER — Ambulatory Visit: Payer: Medicare Other | Admitting: Cardiovascular Disease

## 2023-10-30 ENCOUNTER — Telehealth: Payer: Self-pay | Admitting: Family

## 2023-10-30 NOTE — Telephone Encounter (Signed)
Patient stopped by the office asking for a letter for his apartment complex stating he can have his dog. He said we did this for him last year also.

## 2023-11-05 ENCOUNTER — Ambulatory Visit (INDEPENDENT_AMBULATORY_CARE_PROVIDER_SITE_OTHER): Payer: Medicare Other | Admitting: Cardiovascular Disease

## 2023-11-05 ENCOUNTER — Encounter: Payer: Self-pay | Admitting: Cardiovascular Disease

## 2023-11-05 VITALS — BP 142/88 | HR 56 | Ht 73.0 in | Wt 272.0 lb

## 2023-11-05 DIAGNOSIS — I5022 Chronic systolic (congestive) heart failure: Secondary | ICD-10-CM | POA: Diagnosis not present

## 2023-11-05 DIAGNOSIS — I482 Chronic atrial fibrillation, unspecified: Secondary | ICD-10-CM

## 2023-11-05 DIAGNOSIS — I739 Peripheral vascular disease, unspecified: Secondary | ICD-10-CM

## 2023-11-05 DIAGNOSIS — E782 Mixed hyperlipidemia: Secondary | ICD-10-CM

## 2023-11-05 DIAGNOSIS — I2510 Atherosclerotic heart disease of native coronary artery without angina pectoris: Secondary | ICD-10-CM

## 2023-11-05 DIAGNOSIS — I10 Essential (primary) hypertension: Secondary | ICD-10-CM

## 2023-11-05 MED ORDER — APIXABAN 5 MG PO TABS: 5.0000 mg | ORAL_TABLET | Freq: Two times a day (BID) | ORAL | 2 refills | Status: DC

## 2023-11-05 NOTE — Progress Notes (Signed)
Cardiology Office Note   Date:  11/05/2023   ID:  Stanley Marks, DOB 08-11-1941, MRN [id number]  PCP:  Miki Kins, FNP  Cardiologist:  Adrian Blackwater, MD      History of Present Illness: Stanley Marks is a 82 y.o. male who presents for  Chief Complaint  Patient presents with   Follow-up    3 month follow up    Shortness of Breath This is a chronic problem. The current episode started more than 1 year ago. The problem has been waxing and waning. Pertinent negatives include no chest pain, leg pain, orthopnea or PND.      Past Medical History:  Diagnosis Date   Aneurysm of infrarenal abdominal aorta (HCC)    a.) measured 3.6 cm on 03/05/2019 CT; b.) measured 3.2 cm on 07/21/2020 MRI   Aortic atherosclerosis (HCC)    Atrial fibrillation (HCC)    on Xarelto   Basal cell carcinoma 08/24/2014   chin, L nose ant alar crease   Bilateral renal cysts    CAD (coronary artery disease)    Cardiomegaly    CHF (congestive heart failure) (HCC)    Chronic anticoagulation    Rivaroxaban   CKD (chronic kidney disease)    COPD (chronic obstructive pulmonary disease) (HCC)    Diverticulosis    Emphysema of lung (HCC)    Hepatic steatosis    History of 2019 novel coronavirus disease (COVID-19) 12/21/2019   Repeat testing on 01/22/2020 remained (+)   HLD (hyperlipidemia)    Hypertension    Obesity (BMI 30-39.9)    Obstructive sleep apnea    Pneumonia due to COVID-19 virus 01/22/2020   PVD (peripheral vascular disease) (HCC)    Squamous cell carcinoma of skin 09/07/2014, 03/23/2020   L dorsal hand   Squamous cell carcinoma of skin 01/17/2015   R prox lateral dorsum of hand     Past Surgical History:  Procedure Laterality Date   APPENDECTOMY     arm surgery     ELECTROPHYSIOLOGIC STUDY N/A 11/20/2016   Procedure: CARDIOVERSION;  Surgeon: Laurier Nancy, MD;  Location: [facility] ORS;  Service: Cardiovascular;  Laterality: N/A;   IR CATHETER TUBE CHANGE  05/05/2021   IR  CHOLANGIOGRAM EXISTING TUBE  08/18/2020   IR PERC CHOLECYSTOSTOMY  07/22/2020   IR REMOVAL BILIARY DRAIN  01/20/2021     Current Outpatient Medications  Medication Sig Dispense Refill   amiodarone (PACERONE) 200 MG tablet Take 1 tablet (200 mg total) by mouth daily. 30 tablet 2   apixaban (ELIQUIS) 5 MG TABS tablet Take 1 tablet (5 mg total) by mouth 2 (two) times daily. 60 tablet 2   ibuprofen (ADVIL) 800 MG tablet Take 1 tablet (800 mg total) by mouth every 8 (eight) hours as needed. 30 tablet 0   metoprolol succinate (TOPROL-XL) 100 MG 24 hr tablet TAKE 1 TABLET BY MOUTH EVERY DAY 90 tablet 0   ondansetron (ZOFRAN-ODT) 4 MG disintegrating tablet DISSOLVE 1 TABLET UNDER THE TONGUE THREE TIMES DAILY AS NEEDED FOR NAUSEA 90 tablet 1   sacubitril-valsartan (ENTRESTO) 49-51 MG Take 1 tablet by mouth 2 (two) times daily. 180 tablet 2   spironolactone (ALDACTONE) 25 MG tablet Take 25 mg by mouth daily as needed (fluid).     torsemide (DEMADEX) 20 MG tablet Take 20 mg by mouth daily.     traMADol (ULTRAM) 50 MG tablet Take 50 mg by mouth 2 (two) times daily as needed for moderate pain.  No current facility-administered medications for this visit.    Allergies:   Atorvastatin and Lipitor [atorvastatin calcium]    Social History:   reports that he has been smoking cigarettes. He started smoking about 66 years ago. He has a 66.9 pack-year smoking history. He has never used smokeless tobacco. He reports that he does not currently use alcohol. He reports that he does not use drugs.   Family History:  family history includes Heart attack in his father; Heart disease in his mother.    ROS:     Review of Systems  Constitutional: Negative.   HENT: Negative.    Eyes: Negative.   Respiratory:  Positive for shortness of breath.   Cardiovascular:  Negative for chest pain, orthopnea and PND.  Gastrointestinal: Negative.   Genitourinary: Negative.   Musculoskeletal: Negative.   Skin: Negative.    Neurological: Negative.   Endo/Heme/Allergies: Negative.   Psychiatric/Behavioral: Negative.    All other systems reviewed and are negative.     All other systems are reviewed and negative.    PHYSICAL EXAM: VS:  BP (!) 142/88   Pulse (!) 56   Ht 6\' 1"  (1.854 m)   Wt 272 lb (123.4 kg)   SpO2 95%   BMI 35.89 kg/m  , BMI Body mass index is 35.89 kg/m. Last weight:  Wt Readings from Last 3 Encounters:  11/05/23 272 lb (123.4 kg)  07/15/23 270 lb (122.5 kg)  03/15/23 266 lb 9.6 oz (120.9 kg)     Physical Exam Vitals reviewed.  Constitutional:      Appearance: Normal appearance. He is normal weight.  HENT:     Head: Normocephalic.     Nose: Nose normal.     Mouth/Throat:     Mouth: Mucous membranes are moist.  Eyes:     Pupils: Pupils are equal, round, and reactive to light.  Cardiovascular:     Rate and Rhythm: Normal rate and regular rhythm.     Pulses: Normal pulses.     Heart sounds: Normal heart sounds.  Pulmonary:     Effort: Pulmonary effort is normal.  Abdominal:     General: Abdomen is flat. Bowel sounds are normal.  Musculoskeletal:        General: Normal range of motion.     Cervical back: Normal range of motion.  Skin:    General: Skin is warm.  Neurological:     General: No focal deficit present.     Mental Status: He is alert.  Psychiatric:        Mood and Affect: Mood normal.       EKG:   Recent Labs: No results found for requested labs within last 365 days.    Lipid Panel    Component Value Date/Time   TRIG 53 12/22/2019 0151      Other studies Reviewed: Additional studies/ records that were reviewed today include:  Review of the above records demonstrates:       No data to display            ASSESSMENT AND PLAN:    ICD-10-CM   1. Atrial fibrillation, chronic (HCC)  I48.20 apixaban (ELIQUIS) 5 MG TABS tablet    PCV ECHOCARDIOGRAM COMPLETE   H as not take xarelto as cannot afford it, gave samples of eliquis and 30  day trial.. Will get echo    2. Athscl heart disease of native coronary artery w/o ang pctrs  I25.10 apixaban (ELIQUIS) 5 MG TABS tablet  PCV ECHOCARDIOGRAM COMPLETE    3. Chronic systolic CHF (congestive heart failure) (HCC)  I50.22 apixaban (ELIQUIS) 5 MG TABS tablet    PCV ECHOCARDIOGRAM COMPLETE    4. Essential hypertension  I10 apixaban (ELIQUIS) 5 MG TABS tablet    PCV ECHOCARDIOGRAM COMPLETE    5. PVD (peripheral vascular disease) (HCC)  I73.9 apixaban (ELIQUIS) 5 MG TABS tablet    PCV ECHOCARDIOGRAM COMPLETE    6. Mixed hyperlipidemia  E78.2 apixaban (ELIQUIS) 5 MG TABS tablet    PCV ECHOCARDIOGRAM COMPLETE       Problem List Items Addressed This Visit       Cardiovascular and Mediastinum   Chronic systolic CHF (congestive heart failure) (HCC)   Relevant Medications   apixaban (ELIQUIS) 5 MG TABS tablet   Other Relevant Orders   PCV ECHOCARDIOGRAM COMPLETE   Atrial fibrillation, chronic (HCC) - Primary   Relevant Medications   apixaban (ELIQUIS) 5 MG TABS tablet   Other Relevant Orders   PCV ECHOCARDIOGRAM COMPLETE   Essential hypertension   Relevant Medications   apixaban (ELIQUIS) 5 MG TABS tablet   Other Relevant Orders   PCV ECHOCARDIOGRAM COMPLETE   PVD (peripheral vascular disease) (HCC)   Relevant Medications   apixaban (ELIQUIS) 5 MG TABS tablet   Other Relevant Orders   PCV ECHOCARDIOGRAM COMPLETE   Athscl heart disease of native coronary artery w/o ang pctrs   Relevant Medications   apixaban (ELIQUIS) 5 MG TABS tablet   Other Relevant Orders   PCV ECHOCARDIOGRAM COMPLETE     Other   Mixed hyperlipidemia   Relevant Medications   apixaban (ELIQUIS) 5 MG TABS tablet   Other Relevant Orders   PCV ECHOCARDIOGRAM COMPLETE       Disposition:   Return in about 4 weeks (around 12/03/2023).    Total time spent: 30 minutes  Signed,  Adrian Blackwater, MD  11/05/2023 10:18 AM    [facility]

## 2023-11-27 ENCOUNTER — Ambulatory Visit: Payer: Medicare Other

## 2023-11-27 DIAGNOSIS — I2510 Atherosclerotic heart disease of native coronary artery without angina pectoris: Secondary | ICD-10-CM

## 2023-11-27 DIAGNOSIS — I361 Nonrheumatic tricuspid (valve) insufficiency: Secondary | ICD-10-CM | POA: Diagnosis not present

## 2023-11-27 DIAGNOSIS — I739 Peripheral vascular disease, unspecified: Secondary | ICD-10-CM

## 2023-11-27 DIAGNOSIS — I482 Chronic atrial fibrillation, unspecified: Secondary | ICD-10-CM

## 2023-11-27 DIAGNOSIS — I10 Essential (primary) hypertension: Secondary | ICD-10-CM

## 2023-11-27 DIAGNOSIS — I351 Nonrheumatic aortic (valve) insufficiency: Secondary | ICD-10-CM

## 2023-11-27 DIAGNOSIS — I5022 Chronic systolic (congestive) heart failure: Secondary | ICD-10-CM

## 2023-11-27 DIAGNOSIS — E782 Mixed hyperlipidemia: Secondary | ICD-10-CM

## 2023-12-02 ENCOUNTER — Ambulatory Visit: Payer: Medicare Other | Admitting: Cardiovascular Disease

## 2023-12-13 ENCOUNTER — Other Ambulatory Visit: Payer: Self-pay

## 2023-12-13 ENCOUNTER — Encounter: Payer: Self-pay | Admitting: Cardiovascular Disease

## 2023-12-13 ENCOUNTER — Ambulatory Visit (INDEPENDENT_AMBULATORY_CARE_PROVIDER_SITE_OTHER): Payer: Medicare Other | Admitting: Cardiovascular Disease

## 2023-12-13 VITALS — BP 122/82 | HR 81 | Ht 73.0 in | Wt 266.4 lb

## 2023-12-13 DIAGNOSIS — I10 Essential (primary) hypertension: Secondary | ICD-10-CM | POA: Diagnosis not present

## 2023-12-13 DIAGNOSIS — E782 Mixed hyperlipidemia: Secondary | ICD-10-CM

## 2023-12-13 DIAGNOSIS — I482 Chronic atrial fibrillation, unspecified: Secondary | ICD-10-CM | POA: Diagnosis not present

## 2023-12-13 DIAGNOSIS — I5022 Chronic systolic (congestive) heart failure: Secondary | ICD-10-CM

## 2023-12-13 DIAGNOSIS — I739 Peripheral vascular disease, unspecified: Secondary | ICD-10-CM

## 2023-12-13 DIAGNOSIS — I2510 Atherosclerotic heart disease of native coronary artery without angina pectoris: Secondary | ICD-10-CM

## 2023-12-13 MED ORDER — SACUBITRIL-VALSARTAN 49-51 MG PO TABS: 1.0000 | ORAL_TABLET | Freq: Two times a day (BID) | ORAL | 2 refills | Status: AC

## 2023-12-13 MED ORDER — APIXABAN 5 MG PO TABS: 5.0000 mg | ORAL_TABLET | Freq: Two times a day (BID) | ORAL | 2 refills | Status: DC

## 2023-12-13 NOTE — Progress Notes (Signed)
 Cardiology Office Note   Date:  12/13/2023   ID:  Stanley Marks, DOB May 22, 1941, MRN [id number]  PCP:  Orlean Alan HERO, FNP  Cardiologist:  Denyse Bathe, MD      History of Present Illness: Stanley Marks is a 83 y.o. male who presents for  Chief Complaint  Patient presents with   Follow-up    4 week follow up, Echo results.    Feeling good      Past Medical History:  Diagnosis Date   Aneurysm of infrarenal abdominal aorta (HCC)    a.) measured 3.6 cm on 03/05/2019 CT; b.) measured 3.2 cm on 07/21/2020 MRI   Aortic atherosclerosis (HCC)    Atrial fibrillation (HCC)    on Xarelto    Basal cell carcinoma 08/24/2014   chin, L nose ant alar crease   Bilateral renal cysts    CAD (coronary artery disease)    Cardiomegaly    CHF (congestive heart failure) (HCC)    Chronic anticoagulation    Rivaroxaban    CKD (chronic kidney disease)    COPD (chronic obstructive pulmonary disease) (HCC)    Diverticulosis    Emphysema of lung (HCC)    Hepatic steatosis    History of 2019 novel coronavirus disease (COVID-19) 12/21/2019   Repeat testing on 01/22/2020 remained (+)   HLD (hyperlipidemia)    Hypertension    Obesity (BMI 30-39.9)    Obstructive sleep apnea    Pneumonia due to COVID-19 virus 01/22/2020   PVD (peripheral vascular disease) (HCC)    Squamous cell carcinoma of skin 09/07/2014, 03/23/2020   L dorsal hand   Squamous cell carcinoma of skin 01/17/2015   R prox lateral dorsum of hand     Past Surgical History:  Procedure Laterality Date   APPENDECTOMY     arm surgery     ELECTROPHYSIOLOGIC STUDY N/A 11/20/2016   Procedure: CARDIOVERSION;  Surgeon: Denyse DELENA Bathe, MD;  Location: [facility] ORS;  Service: Cardiovascular;  Laterality: N/A;   IR CATHETER TUBE CHANGE  05/05/2021   IR CHOLANGIOGRAM EXISTING TUBE  08/18/2020   IR PERC CHOLECYSTOSTOMY  07/22/2020   IR REMOVAL BILIARY DRAIN  01/20/2021     Current Outpatient Medications  Medication Sig Dispense Refill    amiodarone  (PACERONE ) 200 MG tablet Take 1 tablet (200 mg total) by mouth daily. 30 tablet 2   apixaban  (ELIQUIS ) 5 MG TABS tablet Take 1 tablet (5 mg total) by mouth 2 (two) times daily. 60 tablet 2   ibuprofen  (ADVIL ) 800 MG tablet Take 1 tablet (800 mg total) by mouth every 8 (eight) hours as needed. 30 tablet 0   metoprolol  succinate (TOPROL -XL) 100 MG 24 hr tablet TAKE 1 TABLET BY MOUTH EVERY DAY 90 tablet 0   ondansetron  (ZOFRAN -ODT) 4 MG disintegrating tablet DISSOLVE 1 TABLET UNDER THE TONGUE THREE TIMES DAILY AS NEEDED FOR NAUSEA 90 tablet 1   sacubitril -valsartan  (ENTRESTO ) 49-51 MG Take 1 tablet by mouth 2 (two) times daily. 180 tablet 2   spironolactone  (ALDACTONE ) 25 MG tablet Take 25 mg by mouth daily as needed (fluid).     torsemide  (DEMADEX ) 20 MG tablet Take 20 mg by mouth daily.     traMADol  (ULTRAM ) 50 MG tablet Take 50 mg by mouth 2 (two) times daily as needed for moderate pain.     No current facility-administered medications for this visit.    Allergies:   Atorvastatin and Lipitor [atorvastatin calcium ]    Social History:   reports that he has been  smoking cigarettes. He started smoking about 67 years ago. He has a 67 pack-year smoking history. He has never used smokeless tobacco. He reports that he does not currently use alcohol. He reports that he does not use drugs.   Family History:  family history includes Heart attack in his father; Heart disease in his mother.    ROS:     Review of Systems  Constitutional: Negative.   HENT: Negative.    Eyes: Negative.   Respiratory: Negative.    Gastrointestinal: Negative.   Genitourinary: Negative.   Musculoskeletal: Negative.   Skin: Negative.   Neurological: Negative.   Endo/Heme/Allergies: Negative.   Psychiatric/Behavioral: Negative.    All other systems reviewed and are negative.     All other systems are reviewed and negative.    PHYSICAL EXAM: VS:  BP 122/82   Pulse 81   Ht 6' 1 (1.854 m)   Wt 266  lb 6.4 oz (120.8 kg)   SpO2 94%   BMI 35.15 kg/m  , BMI Body mass index is 35.15 kg/m. Last weight:  Wt Readings from Last 3 Encounters:  12/13/23 266 lb 6.4 oz (120.8 kg)  11/05/23 272 lb (123.4 kg)  07/15/23 270 lb (122.5 kg)     Physical Exam Vitals reviewed.  Constitutional:      Appearance: Normal appearance. He is normal weight.  HENT:     Head: Normocephalic.     Nose: Nose normal.     Mouth/Throat:     Mouth: Mucous membranes are moist.  Eyes:     Pupils: Pupils are equal, round, and reactive to light.  Cardiovascular:     Rate and Rhythm: Normal rate and regular rhythm.     Pulses: Normal pulses.     Heart sounds: Normal heart sounds.  Pulmonary:     Effort: Pulmonary effort is normal.  Abdominal:     General: Abdomen is flat. Bowel sounds are normal.  Musculoskeletal:        General: Normal range of motion.     Cervical back: Normal range of motion.  Skin:    General: Skin is warm.  Neurological:     General: No focal deficit present.     Mental Status: He is alert.  Psychiatric:        Mood and Affect: Mood normal.       EKG:   Recent Labs: No results found for requested labs within last 365 days.    Lipid Panel    Component Value Date/Time   TRIG 53 12/22/2019 0151      Other studies Reviewed: Additional studies/ records that were reviewed today include:  Review of the above records demonstrates:       No data to display            ASSESSMENT AND PLAN:    ICD-10-CM   1. Athscl heart disease of native coronary artery w/o ang pctrs  I25.10     2. Atrial fibrillation, chronic (HCC)  I48.20     3. Chronic systolic CHF (congestive heart failure) (HCC)  I50.22    On entresto  49 mg BP is better, as was low on last visit. ECHO showed 45-50 %, improving from 30 %    4. Essential hypertension  I10     5. PVD (peripheral vascular disease) (HCC)  I73.9     6. Mixed hyperlipidemia  E78.2        Problem List Items Addressed This  Visit  Cardiovascular and Mediastinum   Chronic systolic CHF (congestive heart failure) (HCC)   Atrial fibrillation, chronic (HCC)   Essential hypertension   PVD (peripheral vascular disease) (HCC)   Athscl heart disease of native coronary artery w/o ang pctrs - Primary     Other   Mixed hyperlipidemia       Disposition:   Return in about 2 months (around 02/10/2024).    Total time spent: 30 minutes  Signed,  Denyse Bathe, MD  12/13/2023 10:21 AM    [facility]

## 2023-12-29 ENCOUNTER — Other Ambulatory Visit: Payer: Self-pay | Admitting: Cardiovascular Disease

## 2023-12-29 ENCOUNTER — Other Ambulatory Visit: Payer: Self-pay | Admitting: Family

## 2023-12-30 ENCOUNTER — Telehealth: Payer: Self-pay | Admitting: Family

## 2023-12-30 NOTE — Telephone Encounter (Signed)
Patient left VM that he would like a call back so we can send in his refills.

## 2023-12-31 NOTE — Telephone Encounter (Signed)
Refill that was in [facility] box was sent, will call pt to make sure nothing else needs to be sent

## 2024-02-10 ENCOUNTER — Ambulatory Visit: Payer: Medicare Other | Admitting: Cardiovascular Disease

## 2024-02-17 ENCOUNTER — Ambulatory Visit (INDEPENDENT_AMBULATORY_CARE_PROVIDER_SITE_OTHER): Payer: Medicare Other | Admitting: Cardiovascular Disease

## 2024-02-17 ENCOUNTER — Ambulatory Visit (INDEPENDENT_AMBULATORY_CARE_PROVIDER_SITE_OTHER): Payer: Medicare Other | Admitting: Podiatry

## 2024-02-17 ENCOUNTER — Encounter: Payer: Self-pay | Admitting: Podiatry

## 2024-02-17 ENCOUNTER — Encounter: Payer: Self-pay | Admitting: Cardiovascular Disease

## 2024-02-17 VITALS — BP 128/87 | HR 78 | Ht 73.0 in | Wt 270.0 lb

## 2024-02-17 DIAGNOSIS — B351 Tinea unguium: Secondary | ICD-10-CM

## 2024-02-17 DIAGNOSIS — Z0130 Encounter for examination of blood pressure without abnormal findings: Secondary | ICD-10-CM

## 2024-02-17 DIAGNOSIS — I739 Peripheral vascular disease, unspecified: Secondary | ICD-10-CM

## 2024-02-17 DIAGNOSIS — R0602 Shortness of breath: Secondary | ICD-10-CM

## 2024-02-17 DIAGNOSIS — D689 Coagulation defect, unspecified: Secondary | ICD-10-CM | POA: Diagnosis not present

## 2024-02-17 DIAGNOSIS — F17210 Nicotine dependence, cigarettes, uncomplicated: Secondary | ICD-10-CM | POA: Diagnosis not present

## 2024-02-17 DIAGNOSIS — I482 Chronic atrial fibrillation, unspecified: Secondary | ICD-10-CM

## 2024-02-17 DIAGNOSIS — I10 Essential (primary) hypertension: Secondary | ICD-10-CM

## 2024-02-17 DIAGNOSIS — I2510 Atherosclerotic heart disease of native coronary artery without angina pectoris: Secondary | ICD-10-CM | POA: Diagnosis not present

## 2024-02-17 DIAGNOSIS — I5022 Chronic systolic (congestive) heart failure: Secondary | ICD-10-CM | POA: Diagnosis not present

## 2024-02-17 DIAGNOSIS — M79609 Pain in unspecified limb: Secondary | ICD-10-CM

## 2024-02-17 DIAGNOSIS — E782 Mixed hyperlipidemia: Secondary | ICD-10-CM

## 2024-02-17 MED ORDER — TORSEMIDE 20 MG PO TABS: 20.0000 mg | ORAL_TABLET | Freq: Two times a day (BID) | ORAL | 2 refills | Status: AC

## 2024-02-17 NOTE — Addendum Note (Signed)
 Addended by: Adrian Blackwater A on: 02/17/2024 10:34 AM   Modules accepted: Orders

## 2024-02-17 NOTE — Progress Notes (Addendum)
 Cardiology Office Note   Date:  02/17/2024   ID:  Stanley Marks, DOB 1941-05-28, MRN [id number]  PCP:  Miki Kins, FNP  Cardiologist:  Adrian Blackwater, MD      History of Present Illness: Stanley Marks is a 83 y.o. male who presents for  Chief Complaint  Patient presents with   Follow-up    Trouble breathing     Feeling SOB      Past Medical History:  Diagnosis Date   Aneurysm of infrarenal abdominal aorta (HCC)    a.) measured 3.6 cm on 03/05/2019 CT; b.) measured 3.2 cm on 07/21/2020 MRI   Aortic atherosclerosis (HCC)    Atrial fibrillation (HCC)    on Xarelto   Basal cell carcinoma 08/24/2014   chin, L nose ant alar crease   Bilateral renal cysts    CAD (coronary artery disease)    Cardiomegaly    CHF (congestive heart failure) (HCC)    Chronic anticoagulation    Rivaroxaban   CKD (chronic kidney disease)    COPD (chronic obstructive pulmonary disease) (HCC)    Diverticulosis    Emphysema of lung (HCC)    Hepatic steatosis    History of 2019 novel coronavirus disease (COVID-19) 12/21/2019   Repeat testing on 01/22/2020 remained (+)   HLD (hyperlipidemia)    Hypertension    Obesity (BMI 30-39.9)    Obstructive sleep apnea    Pneumonia due to COVID-19 virus 01/22/2020   PVD (peripheral vascular disease) (HCC)    Squamous cell carcinoma of skin 09/07/2014, 03/23/2020   L dorsal hand   Squamous cell carcinoma of skin 01/17/2015   R prox lateral dorsum of hand     Past Surgical History:  Procedure Laterality Date   APPENDECTOMY     arm surgery     ELECTROPHYSIOLOGIC STUDY N/A 11/20/2016   Procedure: CARDIOVERSION;  Surgeon: Laurier Nancy, MD;  Location: [facility] ORS;  Service: Cardiovascular;  Laterality: N/A;   IR CATHETER TUBE CHANGE  05/05/2021   IR CHOLANGIOGRAM EXISTING TUBE  08/18/2020   IR PERC CHOLECYSTOSTOMY  07/22/2020   IR REMOVAL BILIARY DRAIN  01/20/2021     Current Outpatient Medications  Medication Sig Dispense Refill   amiodarone  (PACERONE) 200 MG tablet TAKE 1 TABLET(200 MG) BY MOUTH DAILY 30 tablet 2   apixaban (ELIQUIS) 5 MG TABS tablet Take 1 tablet (5 mg total) by mouth 2 (two) times daily. 180 tablet 2   ibuprofen (ADVIL) 800 MG tablet Take 1 tablet (800 mg total) by mouth every 8 (eight) hours as needed. 30 tablet 0   metoprolol succinate (TOPROL-XL) 100 MG 24 hr tablet TAKE 1 TABLET BY MOUTH EVERY DAY 90 tablet 0   ondansetron (ZOFRAN-ODT) 4 MG disintegrating tablet DISSOLVE 1 TABLET UNDER THE TONGUE THREE TIMES DAILY AS NEEDED FOR NAUSEA 90 tablet 1   sacubitril-valsartan (ENTRESTO) 49-51 MG Take 1 tablet by mouth 2 (two) times daily. 180 tablet 2   spironolactone (ALDACTONE) 25 MG tablet Take 25 mg by mouth daily as needed (fluid).     torsemide (DEMADEX) 20 MG tablet Take 1 tablet (20 mg total) by mouth 2 (two) times daily. 60 tablet 2   traMADol (ULTRAM) 50 MG tablet Take 50 mg by mouth 2 (two) times daily as needed for moderate pain.     No current facility-administered medications for this visit.    Allergies:   Atorvastatin and Lipitor [atorvastatin calcium]    Social History:   reports that he  has been smoking cigarettes. He started smoking about 67 years ago. He has a 67.2 pack-year smoking history. He has never used smokeless tobacco. He reports that he does not currently use alcohol. He reports that he does not use drugs.   Family History:  family history includes Heart attack in his father; Heart disease in his mother.    ROS:     Review of Systems  Constitutional: Negative.   HENT: Negative.    Eyes: Negative.   Respiratory: Negative.    Gastrointestinal: Negative.   Genitourinary: Negative.   Musculoskeletal: Negative.   Skin: Negative.   Neurological: Negative.   Endo/Heme/Allergies: Negative.   Psychiatric/Behavioral: Negative.    All other systems reviewed and are negative.     All other systems are reviewed and negative.    PHYSICAL EXAM: VS:  BP 128/87   Pulse 78   Ht 6'  1" (1.854 m)   Wt 270 lb (122.5 kg)   SpO2 94%   BMI 35.62 kg/m  , BMI Body mass index is 35.62 kg/m. Last weight:  Wt Readings from Last 3 Encounters:  02/17/24 270 lb (122.5 kg)  12/13/23 266 lb 6.4 oz (120.8 kg)  11/05/23 272 lb (123.4 kg)     Physical Exam Vitals reviewed.  Constitutional:      Appearance: Normal appearance. He is normal weight.  HENT:     Head: Normocephalic.     Nose: Nose normal.     Mouth/Throat:     Mouth: Mucous membranes are moist.  Eyes:     Pupils: Pupils are equal, round, and reactive to light.  Cardiovascular:     Rate and Rhythm: Normal rate and regular rhythm.     Pulses: Normal pulses.     Heart sounds: Normal heart sounds.  Pulmonary:     Effort: Pulmonary effort is normal.  Abdominal:     General: Abdomen is flat. Bowel sounds are normal.  Musculoskeletal:        General: Normal range of motion.     Cervical back: Normal range of motion.  Skin:    General: Skin is warm.  Neurological:     General: No focal deficit present.     Mental Status: He is alert.  Psychiatric:        Mood and Affect: Mood normal.       EKG:   Recent Labs: No results found for requested labs within last 365 days.    Lipid Panel    Component Value Date/Time   TRIG 53 12/22/2019 0151      Other studies Reviewed: Additional studies/ records that were reviewed today include:  Review of the above records demonstrates:       No data to display            ASSESSMENT AND PLAN:    ICD-10-CM   1. SOB (shortness of breath)  R06.02 PCV ECHOCARDIOGRAM COMPLETE    MYOCARDIAL PERFUSION IMAGING    torsemide (DEMADEX) 20 MG tablet   Needs to quit smoking, has COPD, and probaly needs inhalers. Will do echo, and stress test. change toresemide to 20 bid as has crepitations at bases of lungs    2. Athscl heart disease of native coronary artery w/o ang pctrs  I25.10 PCV ECHOCARDIOGRAM COMPLETE    MYOCARDIAL PERFUSION IMAGING    torsemide  (DEMADEX) 20 MG tablet    3. Atrial fibrillation, chronic (HCC)  I48.20 PCV ECHOCARDIOGRAM COMPLETE    MYOCARDIAL PERFUSION IMAGING  torsemide (DEMADEX) 20 MG tablet    4. Chronic systolic CHF (congestive heart failure) (HCC)  I50.22 PCV ECHOCARDIOGRAM COMPLETE    MYOCARDIAL PERFUSION IMAGING    torsemide (DEMADEX) 20 MG tablet   increase toresemide to bid    5. Essential hypertension  I10 PCV ECHOCARDIOGRAM COMPLETE    MYOCARDIAL PERFUSION IMAGING    torsemide (DEMADEX) 20 MG tablet    6. PVD (peripheral vascular disease) (HCC)  I73.9 PCV ECHOCARDIOGRAM COMPLETE    MYOCARDIAL PERFUSION IMAGING    torsemide (DEMADEX) 20 MG tablet    7. Mixed hyperlipidemia  E78.2 PCV ECHOCARDIOGRAM COMPLETE    MYOCARDIAL PERFUSION IMAGING    torsemide (DEMADEX) 20 MG tablet       Problem List Items Addressed This Visit       Cardiovascular and Mediastinum   Chronic systolic CHF (congestive heart failure) (HCC)   Relevant Medications   torsemide (DEMADEX) 20 MG tablet   Other Relevant Orders   PCV ECHOCARDIOGRAM COMPLETE   MYOCARDIAL PERFUSION IMAGING   Atrial fibrillation, chronic (HCC)   Relevant Medications   torsemide (DEMADEX) 20 MG tablet   Other Relevant Orders   PCV ECHOCARDIOGRAM COMPLETE   MYOCARDIAL PERFUSION IMAGING   Essential hypertension   Relevant Medications   torsemide (DEMADEX) 20 MG tablet   Other Relevant Orders   PCV ECHOCARDIOGRAM COMPLETE   MYOCARDIAL PERFUSION IMAGING   PVD (peripheral vascular disease) (HCC)   Relevant Medications   torsemide (DEMADEX) 20 MG tablet   Other Relevant Orders   PCV ECHOCARDIOGRAM COMPLETE   MYOCARDIAL PERFUSION IMAGING   Athscl heart disease of native coronary artery w/o ang pctrs   Relevant Medications   torsemide (DEMADEX) 20 MG tablet   Other Relevant Orders   PCV ECHOCARDIOGRAM COMPLETE   MYOCARDIAL PERFUSION IMAGING     Other   Mixed hyperlipidemia   Relevant Medications   torsemide (DEMADEX) 20 MG tablet    Other Relevant Orders   PCV ECHOCARDIOGRAM COMPLETE   MYOCARDIAL PERFUSION IMAGING   Other Visit Diagnoses       SOB (shortness of breath)    -  Primary   Needs to quit smoking, has COPD, and probaly needs inhalers. Will do echo, and stress test. change toresemide to 20 bid as has crepitations at bases of lungs   Relevant Medications   torsemide (DEMADEX) 20 MG tablet   Other Relevant Orders   PCV ECHOCARDIOGRAM COMPLETE   MYOCARDIAL PERFUSION IMAGING          Disposition:   Return in about 4 weeks (around 03/16/2024) for echo, stress test and f/u, set up to see Marchelle Folks also.    Total time spent: 30 minutes  Signed,  Adrian Blackwater, MD  02/17/2024 10:33 AM    [facility]

## 2024-02-18 NOTE — Progress Notes (Signed)
 Subjective:  Patient ID: Stanley Marks, male    DOB: June 18, 1941,  MRN: [id number] HPI Chief Complaint  Patient presents with   Debridement    Toenails - long and thick, unable to cut them himself   New Patient (Initial Visit)    Est pt 2020    83 y.o. male presents with the above complaint.   ROS: Denies fever chills nausea vomiting muscle aches pains calf pain back pain chest pain shortness of breath  Past Medical History:  Diagnosis Date   Aneurysm of infrarenal abdominal aorta (HCC)    a.) measured 3.6 cm on 03/05/2019 CT; b.) measured 3.2 cm on 07/21/2020 MRI   Aortic atherosclerosis (HCC)    Atrial fibrillation (HCC)    on Xarelto   Basal cell carcinoma 08/24/2014   chin, L nose ant alar crease   Bilateral renal cysts    CAD (coronary artery disease)    Cardiomegaly    CHF (congestive heart failure) (HCC)    Chronic anticoagulation    Rivaroxaban   CKD (chronic kidney disease)    COPD (chronic obstructive pulmonary disease) (HCC)    Diverticulosis    Emphysema of lung (HCC)    Hepatic steatosis    History of 2019 novel coronavirus disease (COVID-19) 12/21/2019   Repeat testing on 01/22/2020 remained (+)   HLD (hyperlipidemia)    Hypertension    Obesity (BMI 30-39.9)    Obstructive sleep apnea    Pneumonia due to COVID-19 virus 01/22/2020   PVD (peripheral vascular disease) (HCC)    Squamous cell carcinoma of skin 09/07/2014, 03/23/2020   L dorsal hand   Squamous cell carcinoma of skin 01/17/2015   R prox lateral dorsum of hand   Past Surgical History:  Procedure Laterality Date   APPENDECTOMY     arm surgery     ELECTROPHYSIOLOGIC STUDY N/A 11/20/2016   Procedure: CARDIOVERSION;  Surgeon: Laurier Nancy, MD;  Location: [facility] ORS;  Service: Cardiovascular;  Laterality: N/A;   IR CATHETER TUBE CHANGE  05/05/2021   IR CHOLANGIOGRAM EXISTING TUBE  08/18/2020   IR PERC CHOLECYSTOSTOMY  07/22/2020   IR REMOVAL BILIARY DRAIN  01/20/2021    Current Outpatient  Medications:    amiodarone (PACERONE) 200 MG tablet, TAKE 1 TABLET(200 MG) BY MOUTH DAILY, Disp: 30 tablet, Rfl: 2   apixaban (ELIQUIS) 5 MG TABS tablet, Take 1 tablet (5 mg total) by mouth 2 (two) times daily., Disp: 180 tablet, Rfl: 2   ibuprofen (ADVIL) 800 MG tablet, Take 1 tablet (800 mg total) by mouth every 8 (eight) hours as needed., Disp: 30 tablet, Rfl: 0   metoprolol succinate (TOPROL-XL) 100 MG 24 hr tablet, TAKE 1 TABLET BY MOUTH EVERY DAY, Disp: 90 tablet, Rfl: 0   ondansetron (ZOFRAN-ODT) 4 MG disintegrating tablet, DISSOLVE 1 TABLET UNDER THE TONGUE THREE TIMES DAILY AS NEEDED FOR NAUSEA, Disp: 90 tablet, Rfl: 1   sacubitril-valsartan (ENTRESTO) 49-51 MG, Take 1 tablet by mouth 2 (two) times daily., Disp: 180 tablet, Rfl: 2   spironolactone (ALDACTONE) 25 MG tablet, Take 25 mg by mouth daily as needed (fluid)., Disp: , Rfl:    torsemide (DEMADEX) 20 MG tablet, Take 1 tablet (20 mg total) by mouth 2 (two) times daily., Disp: 60 tablet, Rfl: 2   traMADol (ULTRAM) 50 MG tablet, Take 50 mg by mouth 2 (two) times daily as needed for moderate pain., Disp: , Rfl:   Allergies  Allergen Reactions   Atorvastatin    Lipitor [Atorvastatin Calcium] Other (  See Comments)    arthralgia   Review of Systems Objective:  There were no vitals filed for this visit.  General: Well developed, nourished, in no acute distress, alert and oriented x3   Dermatological: Skin is warm, dry xerotic and scaly bilateral. Nails x 10 are poorly kept thick long discolored subungual debris with dystrophy and mycosis; remaining integument appears unremarkable at this time. There are no open sores, no preulcerative lesions, no rash or signs of infection present.  Vascular: Dorsalis Pedis artery and Posterior Tibial artery pedal pulses are 2/4 bilateral with immedate capillary fill time. Pedal hair growth present. No varicosities and no lower extremity edema present bilateral.   Neruologic: Grossly intact via light  touch bilateral. Vibratory intact via tuning fork bilateral. Protective threshold with Semmes Wienstein monofilament intact to all pedal sites bilateral. Patellar and Achilles deep tendon reflexes 2+ bilateral. No Babinski or clonus noted bilateral.   Musculoskeletal: No gross boney pedal deformities bilateral. No pain, crepitus, or limitation noted with foot and ankle range of motion bilateral. Muscular strength 5/5 in all groups tested bilateral.  Gait: Unassisted, Nonantalgic.    Radiographs:  None taken  Assessment & Plan:   Assessment: Pain limb secondary to onychomycosis benign skin lesions.  Plan: Debridement of toenails 1 through 5 bilateral.     Iyah Laguna T. [place], [place]

## 2024-02-25 ENCOUNTER — Encounter: Payer: Self-pay | Admitting: Family

## 2024-02-25 ENCOUNTER — Ambulatory Visit (INDEPENDENT_AMBULATORY_CARE_PROVIDER_SITE_OTHER): Admitting: Family

## 2024-02-25 VITALS — BP 122/78 | HR 68 | Ht 73.0 in | Wt 274.4 lb

## 2024-02-25 DIAGNOSIS — J4489 Other specified chronic obstructive pulmonary disease: Secondary | ICD-10-CM

## 2024-02-25 DIAGNOSIS — N1831 Chronic kidney disease, stage 3a: Secondary | ICD-10-CM | POA: Diagnosis not present

## 2024-02-25 DIAGNOSIS — E782 Mixed hyperlipidemia: Secondary | ICD-10-CM | POA: Diagnosis not present

## 2024-02-25 DIAGNOSIS — J9610 Chronic respiratory failure, unspecified whether with hypoxia or hypercapnia: Secondary | ICD-10-CM

## 2024-02-25 DIAGNOSIS — I5022 Chronic systolic (congestive) heart failure: Secondary | ICD-10-CM

## 2024-02-25 MED ORDER — BREZTRI AEROSPHERE 160-9-4.8 MCG/ACT IN AERO: 2.0000 | INHALATION_SPRAY | Freq: Two times a day (BID) | RESPIRATORY_TRACT | 3 refills | Status: AC

## 2024-02-25 NOTE — Progress Notes (Signed)
 Established Patient Office Visit  Subjective:  Patient ID: Stanley Marks, male    DOB: 09-Aug-1941  Age: 83 y.o. MRN: [id number]  Chief Complaint  Patient presents with   Follow-up    Patient is here today for his  follow up.  He has been feeling poorly since last appointment.   He does have additional concerns to discuss today.  Coughing has gotten worse, asks if we can start him on something else for his COPD.  He also has a rash on his chest that he says is very itchy.   Labs are not due today. He needs refills.   I have reviewed his active problem list, medication list, allergies, notes from last encounter, lab results for his appointment today.       No other concerns at this time.   Past Medical History:  Diagnosis Date   Acute calculous cholecystitis 07/20/2020   Acute respiratory failure due to COVID-19 [facility]) 12/22/2019   Acute respiratory failure with hypoxia (HCC) 12/22/2019   Aneurysm of infrarenal abdominal aorta (HCC)    a.) measured 3.6 cm on 03/05/2019 CT; b.) measured 3.2 cm on 07/21/2020 MRI   Aortic atherosclerosis (HCC)    Athscl heart disease of native coronary artery w/o ang pctrs 05/11/2019   Atrial fibrillation (HCC)    on Xarelto    Basal cell carcinoma 08/24/2014   chin, L nose ant alar crease   Bilateral renal cysts    CAD (coronary artery disease)    Cardiomegaly    CHF (congestive heart failure) (HCC)    Chronic anticoagulation    Rivaroxaban    CKD (chronic kidney disease)    COPD (chronic obstructive pulmonary disease) (HCC)    COPD exacerbation (HCC) 01/28/2017   Diverticulosis    Emphysema of lung (HCC)    Hepatic steatosis    History of 2019 novel coronavirus disease (COVID-19) 12/21/2019   Repeat testing on 01/22/2020 remained (+)   HLD (hyperlipidemia)    Hypertension    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   Obesity (BMI 30-39.9)    Obstructive sleep apnea    Pneumonia 01/28/2017    Pneumonia due to COVID-19 virus 01/22/2020   PVD (peripheral vascular disease) (HCC)    Squamous cell carcinoma of skin 09/07/2014, 03/23/2020   L dorsal hand   Squamous cell carcinoma of skin 01/17/2015   R prox lateral dorsum of hand   Wound of left leg     Past Surgical History:  Procedure Laterality Date   APPENDECTOMY     arm surgery     ELECTROPHYSIOLOGIC STUDY N/A 11/20/2016   Procedure: CARDIOVERSION;  Surgeon: Cherrie Cornwall, MD;  Location: [facility] ORS;  Service: Cardiovascular;  Laterality: N/A;   IR CATHETER TUBE CHANGE  05/05/2021   IR CHOLANGIOGRAM EXISTING TUBE  08/18/2020   IR PERC CHOLECYSTOSTOMY  07/22/2020   IR REMOVAL BILIARY DRAIN  01/20/2021    Social History   Socioeconomic History   Marital status: Widowed    Spouse name: Not on file   Number of children: 4   Years of education: Not on file   Highest education level: Not on file  Occupational History   Not on file  Tobacco Use   Smoking status: Every Day    Current packs/day: 1.00    Average packs/day: 1 pack/day for 67.4 years (67.4 ttl pk-yrs)    Types: Cigarettes    Start date: 12/10/1956   Smokeless tobacco: Never  Vaping Use   Vaping status: Never Used  Substance and Sexual Activity   Alcohol use: Not Currently    Comment: quit 1979; 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   Drug use: Never   Sexual activity: Not on file  Other Topics Concern   Not on file  Social History Narrative   Son and daughter in law lives with pt. And 3 grandchildren; also 2 dogs . Lives in apt.    Social Drivers of Corporate investment banker Strain: Not on file  Food Insecurity: Not on file  Transportation Needs: Not on file  Physical Activity: Not on file  Stress: Not on file  Social Connections: Not on file  Intimate Partner Violence: Not on file    Family History  Problem Relation Age of Onset   Heart disease Mother    Heart attack Father     Allergies  Allergen Reactions   Atorvastatin    Lipitor [Atorvastatin  Calcium ] Other (See Comments)    arthralgia    Review of Systems  Respiratory:  Positive for cough, shortness of breath and wheezing.   Skin:  Positive for itching and rash.  All other systems reviewed and are negative.      Objective:   BP 122/78   Pulse 68   Ht 6\' 1"  (1.854 m)   Wt 274 lb 6.4 oz (124.5 kg)   SpO2 96%   BMI 36.20 kg/m   Vitals:   02/25/24 0953  BP: 122/78  Pulse: 68  Height: 6\' 1"  (1.854 m)  Weight: 274 lb 6.4 oz (124.5 kg)  SpO2: 96%  BMI (Calculated): 36.21    Physical Exam Constitutional:      General: He is not in acute distress.    Appearance: Normal appearance. He is obese. He is ill-appearing.  HENT:     Head: Normocephalic and atraumatic.  Pulmonary:     Effort: No respiratory distress.     Breath sounds: Wheezing and rhonchi present.  Neurological:     General: No focal deficit present.     Mental Status: He is alert and oriented to person, place, and time. Mental status is at baseline.  Psychiatric:        Mood and Affect: Mood normal.        Behavior: Behavior normal.        Thought Content: Thought content normal.        Judgment: Judgment normal.      No results found for any visits on 02/25/24.  No results found for this or any previous visit (from the past 2160 hours).     Assessment & Plan:   Problem List Items Addressed This Visit       Cardiovascular and Mediastinum   Chronic systolic CHF (congestive heart failure) (HCC)   Patient is seen by Cardiology, who manage this condition.  He is well controlled with current therapy.   Will defer to them for further changes to plan of care.         Respiratory   Chronic respiratory failure (HCC)   Patient stable.  Well controlled with current therapy.   Continue current meds.        COPD (chronic obstructive pulmonary disease) with chronic bronchitis (HCC)   Adding Breztri  inhaler, to replace his trelegy.   I suspect that the BID dosing will be beneficial  for him.   Will reassess at follow up.       Relevant Medications   budeson-glycopyrrolate-formoterol  (BREZTRI   AEROSPHERE) 160-9-4.8 MCG/ACT AERO     Genitourinary   CKD (chronic kidney disease) stage 3, GFR 30-59 ml/min (HCC) - Primary   Patient stable.  Well controlled with current therapy.   Continue current meds.         Other   Morbid (severe) obesity due to excess calories (HCC)   Continue current meds.  Will adjust as needed based on results.  The patient is asked to make an attempt to improve diet and exercise patterns to aid in medical management of this problem. Addressed importance of increasing and maintaining water intake.        Mixed hyperlipidemia   Checking labs today.  Continue current therapy for lipid control. Will modify as needed based on labwork results.         Return in about 4 months (around 06/26/2024).   Total time spent: 20 minutes  Trenda Frisk, FNP  02/25/2024   This document may have been prepared by [facility] Voice Recognition software and as such may include unintentional dictation errors.

## 2024-03-02 ENCOUNTER — Ambulatory Visit (INDEPENDENT_AMBULATORY_CARE_PROVIDER_SITE_OTHER)

## 2024-03-02 DIAGNOSIS — E782 Mixed hyperlipidemia: Secondary | ICD-10-CM

## 2024-03-02 DIAGNOSIS — I5022 Chronic systolic (congestive) heart failure: Secondary | ICD-10-CM

## 2024-03-02 DIAGNOSIS — I2510 Atherosclerotic heart disease of native coronary artery without angina pectoris: Secondary | ICD-10-CM

## 2024-03-02 DIAGNOSIS — I482 Chronic atrial fibrillation, unspecified: Secondary | ICD-10-CM

## 2024-03-02 DIAGNOSIS — I340 Nonrheumatic mitral (valve) insufficiency: Secondary | ICD-10-CM | POA: Diagnosis not present

## 2024-03-02 DIAGNOSIS — R0602 Shortness of breath: Secondary | ICD-10-CM

## 2024-03-02 DIAGNOSIS — I361 Nonrheumatic tricuspid (valve) insufficiency: Secondary | ICD-10-CM | POA: Diagnosis not present

## 2024-03-02 DIAGNOSIS — I10 Essential (primary) hypertension: Secondary | ICD-10-CM

## 2024-03-02 DIAGNOSIS — I739 Peripheral vascular disease, unspecified: Secondary | ICD-10-CM

## 2024-03-11 ENCOUNTER — Other Ambulatory Visit: Payer: Self-pay

## 2024-03-11 MED ORDER — AMIODARONE HCL 200 MG PO TABS: 200.0000 mg | ORAL_TABLET | Freq: Every day | ORAL | 2 refills | Status: DC

## 2024-03-16 ENCOUNTER — Ambulatory Visit (INDEPENDENT_AMBULATORY_CARE_PROVIDER_SITE_OTHER): Admitting: Cardiovascular Disease

## 2024-03-16 ENCOUNTER — Encounter: Payer: Self-pay | Admitting: Cardiovascular Disease

## 2024-03-16 VITALS — BP 160/98 | HR 82 | Ht 73.0 in | Wt 270.0 lb

## 2024-03-16 DIAGNOSIS — E782 Mixed hyperlipidemia: Secondary | ICD-10-CM

## 2024-03-16 DIAGNOSIS — I5022 Chronic systolic (congestive) heart failure: Secondary | ICD-10-CM

## 2024-03-16 DIAGNOSIS — R0602 Shortness of breath: Secondary | ICD-10-CM | POA: Diagnosis not present

## 2024-03-16 DIAGNOSIS — I10 Essential (primary) hypertension: Secondary | ICD-10-CM | POA: Diagnosis not present

## 2024-03-16 DIAGNOSIS — I482 Chronic atrial fibrillation, unspecified: Secondary | ICD-10-CM

## 2024-03-16 DIAGNOSIS — I2510 Atherosclerotic heart disease of native coronary artery without angina pectoris: Secondary | ICD-10-CM

## 2024-03-16 DIAGNOSIS — F17210 Nicotine dependence, cigarettes, uncomplicated: Secondary | ICD-10-CM | POA: Diagnosis not present

## 2024-03-16 DIAGNOSIS — I739 Peripheral vascular disease, unspecified: Secondary | ICD-10-CM

## 2024-03-16 MED ORDER — NYSTATIN 100000 UNIT/GM EX POWD: 1.0000 | Freq: Two times a day (BID) | CUTANEOUS | 0 refills | Status: DC

## 2024-03-16 NOTE — Progress Notes (Signed)
 Cardiology Office Note   Date:  03/16/2024   ID:  Stanley Marks, DOB 22-Jul-1941, MRN [id number]  PCP:  Miki Kins, FNP  Cardiologist:  Adrian Blackwater, MD      History of Present Illness: Stanley Marks is a 83 y.o. male who presents for  Chief Complaint  Patient presents with   Follow-up    4 week follow  ECHO results    Feels SOB as air conditioning down.      Past Medical History:  Diagnosis Date   Aneurysm of infrarenal abdominal aorta (HCC)    a.) measured 3.6 cm on 03/05/2019 CT; b.) measured 3.2 cm on 07/21/2020 MRI   Aortic atherosclerosis (HCC)    Atrial fibrillation (HCC)    on Xarelto   Basal cell carcinoma 08/24/2014   chin, L nose ant alar crease   Bilateral renal cysts    CAD (coronary artery disease)    Cardiomegaly    CHF (congestive heart failure) (HCC)    Chronic anticoagulation    Rivaroxaban   CKD (chronic kidney disease)    COPD (chronic obstructive pulmonary disease) (HCC)    Diverticulosis    Emphysema of lung (HCC)    Hepatic steatosis    History of 2019 novel coronavirus disease (COVID-19) 12/21/2019   Repeat testing on 01/22/2020 remained (+)   HLD (hyperlipidemia)    Hypertension    Obesity (BMI 30-39.9)    Obstructive sleep apnea    Pneumonia due to COVID-19 virus 01/22/2020   PVD (peripheral vascular disease) (HCC)    Squamous cell carcinoma of skin 09/07/2014, 03/23/2020   L dorsal hand   Squamous cell carcinoma of skin 01/17/2015   R prox lateral dorsum of hand     Past Surgical History:  Procedure Laterality Date   APPENDECTOMY     arm surgery     ELECTROPHYSIOLOGIC STUDY N/A 11/20/2016   Procedure: CARDIOVERSION;  Surgeon: Laurier Nancy, MD;  Location: [facility] ORS;  Service: Cardiovascular;  Laterality: N/A;   IR CATHETER TUBE CHANGE  05/05/2021   IR CHOLANGIOGRAM EXISTING TUBE  08/18/2020   IR PERC CHOLECYSTOSTOMY  07/22/2020   IR REMOVAL BILIARY DRAIN  01/20/2021     Current Outpatient Medications  Medication  Sig Dispense Refill   apixaban (ELIQUIS) 5 MG TABS tablet Take 1 tablet (5 mg total) by mouth 2 (two) times daily. 180 tablet 2   amiodarone (PACERONE) 200 MG tablet Take 1 tablet (200 mg total) by mouth daily. 30 tablet 2   budeson-glycopyrrolate-formoterol (BREZTRI AEROSPHERE) 160-9-4.8 MCG/ACT AERO Inhale 2 puffs into the lungs in the morning and at bedtime. 32.1 g 3   ibuprofen (ADVIL) 800 MG tablet Take 1 tablet (800 mg total) by mouth every 8 (eight) hours as needed. 30 tablet 0   metoprolol succinate (TOPROL-XL) 100 MG 24 hr tablet TAKE 1 TABLET BY MOUTH EVERY DAY 90 tablet 0   ondansetron (ZOFRAN-ODT) 4 MG disintegrating tablet DISSOLVE 1 TABLET UNDER THE TONGUE THREE TIMES DAILY AS NEEDED FOR NAUSEA 90 tablet 1   sacubitril-valsartan (ENTRESTO) 49-51 MG Take 1 tablet by mouth 2 (two) times daily. 180 tablet 2   spironolactone (ALDACTONE) 25 MG tablet Take 25 mg by mouth daily as needed (fluid).     torsemide (DEMADEX) 20 MG tablet Take 1 tablet (20 mg total) by mouth 2 (two) times daily. 60 tablet 2   traMADol (ULTRAM) 50 MG tablet Take 50 mg by mouth 2 (two) times daily as needed for moderate pain.  No current facility-administered medications for this visit.    Allergies:   Atorvastatin and Lipitor [atorvastatin calcium]    Social History:   reports that he has been smoking cigarettes. He started smoking about 67 years ago. He has a 67.3 pack-year smoking history. He has never used smokeless tobacco. He reports that he does not currently use alcohol. He reports that he does not use drugs.   Family History:  family history includes Heart attack in his father; Heart disease in his mother.    ROS:     Review of Systems  Constitutional: Negative.   HENT: Negative.    Eyes: Negative.   Respiratory: Negative.    Gastrointestinal: Negative.   Genitourinary: Negative.   Musculoskeletal: Negative.   Skin: Negative.   Neurological: Negative.   Endo/Heme/Allergies: Negative.    Psychiatric/Behavioral: Negative.    All other systems reviewed and are negative.     All other systems are reviewed and negative.    PHYSICAL EXAM: VS:  BP (!) 160/98   Pulse 82   Ht 6\' 1"  (1.854 m)   Wt 270 lb (122.5 kg)   SpO2 98%   BMI 35.62 kg/m  , BMI Body mass index is 35.62 kg/m. Last weight:  Wt Readings from Last 3 Encounters:  03/16/24 270 lb (122.5 kg)  02/25/24 274 lb 6.4 oz (124.5 kg)  02/17/24 270 lb (122.5 kg)     Physical Exam Vitals reviewed.  Constitutional:      Appearance: Normal appearance. He is normal weight.  HENT:     Head: Normocephalic.     Nose: Nose normal.     Mouth/Throat:     Mouth: Mucous membranes are moist.  Eyes:     Pupils: Pupils are equal, round, and reactive to light.  Cardiovascular:     Rate and Rhythm: Normal rate and regular rhythm.     Pulses: Normal pulses.     Heart sounds: Normal heart sounds.  Pulmonary:     Effort: Pulmonary effort is normal.  Abdominal:     General: Abdomen is flat. Bowel sounds are normal.  Musculoskeletal:        General: Normal range of motion.     Cervical back: Normal range of motion.  Skin:    General: Skin is warm.  Neurological:     General: No focal deficit present.     Mental Status: He is alert.  Psychiatric:        Mood and Affect: Mood normal.       EKG:   Recent Labs: No results found for requested labs within last 365 days.    Lipid Panel    Component Value Date/Time   TRIG 53 12/22/2019 0151      Other studies Reviewed: Additional studies/ records that were reviewed today include:  Review of the above records demonstrates:       No data to display            ASSESSMENT AND PLAN:    ICD-10-CM   1. SOB (shortness of breath)  R06.02    Feels SOB, but felt ok while AC was working.    2. Atrial fibrillation, chronic (HCC)  I48.20     3. Chronic systolic CHF (congestive heart failure) (HCC)  I50.22     4. Essential hypertension  I10     5.  PVD (peripheral vascular disease) (HCC)  I73.9     6. Mixed hyperlipidemia  E78.2     7. Athscl heart  disease of native coronary artery w/o ang pctrs  I25.10        Problem List Items Addressed This Visit       Cardiovascular and Mediastinum   Chronic systolic CHF (congestive heart failure) (HCC)   Atrial fibrillation, chronic (HCC)   Essential hypertension   PVD (peripheral vascular disease) (HCC)   Athscl heart disease of native coronary artery w/o ang pctrs     Other   Mixed hyperlipidemia   Other Visit Diagnoses       SOB (shortness of breath)    -  Primary   Feels SOB, but felt ok while AC was working.          Disposition:   Return in about 2 months (around 05/16/2024).    Total time spent: 30 minutes  Signed,  Adrian Blackwater, MD  03/16/2024 9:46 AM    [facility]

## 2024-05-03 ENCOUNTER — Encounter: Payer: Self-pay | Admitting: Family

## 2024-05-03 NOTE — Assessment & Plan Note (Signed)
 Checking labs today.  Continue current therapy for lipid control. Will modify as needed based on labwork results.

## 2024-05-03 NOTE — Assessment & Plan Note (Signed)
 Continue current meds.  Will adjust as needed based on results.  The patient is asked to make an attempt to improve diet and exercise patterns to aid in medical management of this problem. Addressed importance of increasing and maintaining water intake.

## 2024-05-03 NOTE — Assessment & Plan Note (Signed)
Patient is seen by Cardiology, who manage this condition.  He is well controlled with current therapy.   Will defer to them for further changes to plan of care.  

## 2024-05-03 NOTE — Assessment & Plan Note (Signed)
 Adding Breztri  inhaler, to replace his trelegy.   I suspect that the BID dosing will be beneficial for him.   Will reassess at follow up.

## 2024-05-03 NOTE — Assessment & Plan Note (Signed)
 Patient stable.  Well controlled with current therapy.   Continue current meds.

## 2024-05-18 ENCOUNTER — Ambulatory Visit: Admitting: Cardiovascular Disease

## 2024-05-21 ENCOUNTER — Emergency Department

## 2024-05-21 ENCOUNTER — Other Ambulatory Visit: Payer: Self-pay

## 2024-05-21 ENCOUNTER — Emergency Department
Admission: EM | Admit: 2024-05-21 | Discharge: 2024-05-21 | Disposition: A | Discharge status: Home / Self Care | Attending: Emergency Medicine | Admitting: Emergency Medicine

## 2024-05-21 ENCOUNTER — Encounter: Payer: Self-pay | Admitting: Emergency Medicine

## 2024-05-21 DIAGNOSIS — I509 Heart failure, unspecified: Secondary | ICD-10-CM | POA: Insufficient documentation

## 2024-05-21 DIAGNOSIS — I2510 Atherosclerotic heart disease of native coronary artery without angina pectoris: Secondary | ICD-10-CM | POA: Insufficient documentation

## 2024-05-21 DIAGNOSIS — Z7901 Long term (current) use of anticoagulants: Secondary | ICD-10-CM | POA: Insufficient documentation

## 2024-05-21 DIAGNOSIS — I130 Hypertensive heart and chronic kidney disease with heart failure and stage 1 through stage 4 chronic kidney disease, or unspecified chronic kidney disease: Secondary | ICD-10-CM | POA: Diagnosis not present

## 2024-05-21 DIAGNOSIS — J441 Chronic obstructive pulmonary disease with (acute) exacerbation: Secondary | ICD-10-CM | POA: Insufficient documentation

## 2024-05-21 DIAGNOSIS — R079 Chest pain, unspecified: Secondary | ICD-10-CM

## 2024-05-21 DIAGNOSIS — R0602 Shortness of breath: Secondary | ICD-10-CM | POA: Diagnosis present

## 2024-05-21 DIAGNOSIS — N189 Chronic kidney disease, unspecified: Secondary | ICD-10-CM | POA: Diagnosis not present

## 2024-05-21 LAB — CBC WITH DIFFERENTIAL/PLATELET
Abs Immature Granulocytes: 0.05 10*3/uL (ref 0.00–0.07)
Basophils Absolute: 0.1 10*3/uL (ref 0.0–0.1)
Basophils Relative: 0 %
Eosinophils Absolute: 0.1 10*3/uL (ref 0.0–0.5)
Eosinophils Relative: 1 %
HCT: 50.3 % (ref 39.0–52.0)
Hemoglobin: 15.9 g/dL (ref 13.0–17.0)
Immature Granulocytes: 0 %
Lymphocytes Relative: 6 %
Lymphs Abs: 0.8 10*3/uL (ref 0.7–4.0)
MCH: 29.6 pg (ref 26.0–34.0)
MCHC: 31.6 g/dL (ref 30.0–36.0)
MCV: 93.5 fL (ref 80.0–100.0)
Monocytes Absolute: 0.6 10*3/uL (ref 0.1–1.0)
Monocytes Relative: 4 %
Neutro Abs: 11.5 10*3/uL — ABNORMAL HIGH (ref 1.7–7.7)
Neutrophils Relative %: 89 %
Platelets: 191 10*3/uL (ref 150–400)
RBC: 5.38 MIL/uL (ref 4.22–5.81)
RDW: 15 % (ref 11.5–15.5)
WBC: 13.1 10*3/uL — ABNORMAL HIGH (ref 4.0–10.5)
nRBC: 0 % (ref 0.0–0.2)

## 2024-05-21 LAB — COMPREHENSIVE METABOLIC PANEL WITH GFR
ALT: 16 U/L (ref 0–44)
AST: 16 U/L (ref 15–41)
Albumin: 3.4 g/dL — ABNORMAL LOW (ref 3.5–5.0)
Alkaline Phosphatase: 96 U/L (ref 38–126)
Anion gap: 10 (ref 5–15)
BUN: 17 mg/dL (ref 8–23)
CO2: 25 mmol/L (ref 22–32)
Calcium: 8.7 mg/dL — ABNORMAL LOW (ref 8.9–10.3)
Chloride: 105 mmol/L (ref 98–111)
Creatinine, Ser: 1.29 mg/dL — ABNORMAL HIGH (ref 0.61–1.24)
GFR, Estimated: 56 mL/min — ABNORMAL LOW (ref >60)
Glucose, Bld: 107 mg/dL — ABNORMAL HIGH (ref 70–99)
Potassium: 4.1 mmol/L (ref 3.5–5.1)
Sodium: 140 mmol/L (ref 135–145)
Total Bilirubin: 1.5 mg/dL — ABNORMAL HIGH (ref 0.0–1.2)
Total Protein: 7.5 g/dL (ref 6.5–8.1)

## 2024-05-21 LAB — TROPONIN I (HIGH SENSITIVITY): Troponin I (High Sensitivity): 5 ng/L (ref <18)

## 2024-05-21 LAB — BRAIN NATRIURETIC PEPTIDE: B Natriuretic Peptide: 83.7 pg/mL (ref 0.0–100.0)

## 2024-05-21 LAB — BLOOD GAS, VENOUS

## 2024-05-21 MED ORDER — MORPHINE SULFATE (PF) 4 MG/ML IV SOLN: 4.0000 mg | Freq: Once | INTRAVENOUS | Status: DC

## 2024-05-21 MED ORDER — IPRATROPIUM-ALBUTEROL 0.5-2.5 (3) MG/3ML IN SOLN
6.0000 mL | Freq: Once | RESPIRATORY_TRACT | Status: AC
  Administered 2024-05-21: 6 mL via RESPIRATORY_TRACT
  Filled 2024-05-21: qty 6

## 2024-05-21 MED ORDER — PREDNISONE 50 MG PO TABS: 50.0000 mg | ORAL_TABLET | Freq: Every day | ORAL | 0 refills | Status: AC

## 2024-05-21 MED ORDER — FUROSEMIDE 10 MG/ML IJ SOLN
40.0000 mg | Freq: Once | INTRAMUSCULAR | Status: DC
  Filled 2024-05-21: qty 4

## 2024-05-21 MED ORDER — METHYLPREDNISOLONE SODIUM SUCC 125 MG IJ SOLR
125.0000 mg | Freq: Once | INTRAMUSCULAR | Status: DC
  Filled 2024-05-21: qty 2

## 2024-05-21 MED ORDER — AMOXICILLIN-POT CLAVULANATE 875-125 MG PO TABS: 1.0000 | ORAL_TABLET | Freq: Two times a day (BID) | ORAL | 0 refills | Status: AC

## 2024-05-21 MED ORDER — NITROGLYCERIN 0.4 MG SL SUBL
0.4000 mg | SUBLINGUAL_TABLET | SUBLINGUAL | Status: DC | PRN
  Administered 2024-05-21: 0.4 mg via SUBLINGUAL
  Filled 2024-05-21: qty 1

## 2024-05-21 MED ORDER — ALBUTEROL SULFATE HFA 108 (90 BASE) MCG/ACT IN AERS: 2.0000 | INHALATION_SPRAY | Freq: Four times a day (QID) | RESPIRATORY_TRACT | 2 refills | Status: AC | PRN

## 2024-05-21 NOTE — ED Notes (Signed)
 Dr. Dodson Freestone at bedside stating he does not want to stay at this the hospital. Pt also stated to this RN he does not want to stay, reports he needs to take care of his adult son who is at home and he is worried about him. Pt agrees to leaving AMA

## 2024-05-21 NOTE — Discharge Instructions (Addendum)
 You are seen in the emergency department for chest pain and shortness of breath.  He did not have any findings of pneumonia but it was concerning for extra fluid on your lungs.  Concern for heart failure exacerbation and COPD exacerbation.  Your initial troponin was negative.  It was recommended that you be admitted to the hospital since you were requiring BiPAP and retaining carbon dioxide.  You were adamant that you would not stay and you wanted to go home.  You decided to sign out AGAINST MEDICAL ADVICE.  It is importantly follow-up closely with your pulmonologist, cardiologist and primary care provider.  You are given a prescription for antibiotic, steroid.  Increase your torsemide  and take 40 mg twice a day for the next 2 days or until you follow-up with cardiology/your primary care provider.  Return to the emergency department at any time.  Prednisone  -you are given a prescription for a steroid.  It is important that you take this medication with food.  This medication can cause an upset stomach.  It also can increase your glucose if you have a history of diabetes, so it is important that you check your glucose frequently while you are on this medication.

## 2024-05-21 NOTE — ED Triage Notes (Signed)
 Pt via [facility] from home. Pt c/o centralized chest pain that started this AM, reports it radiates down to his upper stomach. Pt has a hx of COPD and wears 4L [place] chronically. Denies any new SOB. EMS reported rhonchi in the pt's lower lobes. Denies any sick contacts. Pt does take Eliquis . Pt is A&Ox4 and NAD

## 2024-05-21 NOTE — ED Notes (Signed)
 Pt taken off of O2 at this time, pt maintaining 97% on RA. Dr. Dodson Freestone at bedside for evaluation

## 2024-05-21 NOTE — ED Notes (Signed)
 Signature pad not working in the room, pt signed a hard copy of the AMA paperwork.

## 2024-05-21 NOTE — ED Provider Notes (Signed)
 [facility] Provider Note    Event Date/Time   First MD Initiated Contact with Patient 05/21/24 [phone]     (approximate)   History   Chest Pain and Shortness of Breath   HPI  Stanley Marks is a 83 y.o. male  PHM atrial fibrillation on Xarelto , CAD, CHF, CKD, COPD, hypertension, hyperlipidemia, presents to the emergency department with shortness of breath and chest pain.  Patient states that he was fine this morning when he woke up and then after waking up developed chest pain.  Describes substernal chest pressure down to his abdomen.  Associated with shortness of breath.  Denies nausea vomiting or diaphoresis.  Asking for morphine  on arrival.  Denies any significant cough.  States he felt fine yesterday.  Takes furosemide as needed for swelling.  Wears oxygen intermittently as needed.  Denies any recent stress testing or cardiac catheterization.  Followed by Dr. Meredeth Stallion with cardiology.      Physical Exam   Triage Vital Signs: ED Triage Vitals [05/21/24 0833]  Encounter Vitals Group     BP      Girls Systolic BP Percentile      Girls Diastolic BP Percentile      Boys Systolic BP Percentile      Boys Diastolic BP Percentile      Pulse      Resp      Temp      Temp src      SpO2      Weight 250 lb (113.4 kg)     Height 6' 1 (1.854 m)     Head Circumference      Peak Flow      Pain Score 8     Pain Loc      Pain Education      Exclude from Growth Chart     Most recent vital signs: Vitals:   05/21/24 1028 05/21/24 1100  BP:  (!) 161/103  Pulse: 98 97  Resp: (!) 22 19  Temp:    SpO2: 100% 98%    Physical Exam Constitutional:      Appearance: He is well-developed. He is obese.  HENT:     Head: Atraumatic.   Eyes:     Conjunctiva/sclera: Conjunctivae normal.    Cardiovascular:     Rate and Rhythm: Regular rhythm.  Pulmonary:     Effort: Tachypnea and respiratory distress present.     Breath sounds: Examination of the right-lower  field reveals rales. Examination of the left-lower field reveals rales. Wheezing (Diffuse expiratory) and rales present.   Musculoskeletal:        General: Normal range of motion.     Cervical back: Normal range of motion.     Right lower leg: Edema present.     Left lower leg: Edema present.     Comments: 2+ pitting edema to bilateral lower extremities   Skin:    General: Skin is warm.     Capillary Refill: Capillary refill takes less than 2 seconds.   Neurological:     General: No focal deficit present.     Mental Status: He is alert. Mental status is at baseline.     IMPRESSION / MDM / ASSESSMENT AND PLAN / ED COURSE  I reviewed the triage vital signs and the nursing notes.  Differential diagnosis including ACS, anemia, COPD exacerbation, CHF, pneumonia  EKG  I, Viviano Ground, the attending physician, personally viewed and interpreted this ECG.  EKG with atrial flutter.  Heart rate 87.  Normal intervals.  No significant ST elevation or depression.  No findings of acute ischemia or dysrhythmia  No tachycardic or bradycardic dysrhythmias while on cardiac telemetry.  RADIOLOGY I independently reviewed imaging, my interpretation of imaging: Chest x-ray --cardiomegaly, findings concerning for pulmonary edema  LABS (all labs ordered are listed, but only abnormal results are displayed) Labs interpreted as -    Labs Reviewed  CBC WITH DIFFERENTIAL/PLATELET - Abnormal; Notable for the following components:      Result Value   WBC 13.1 (*)    Neutro Abs 11.5 (*)    All other components within normal limits  COMPREHENSIVE METABOLIC PANEL WITH GFR - Abnormal; Notable for the following components:   Glucose, Bld 107 (*)    Creatinine, Ser 1.29 (*)    Calcium  8.7 (*)    Albumin 3.4 (*)    Total Bilirubin 1.5 (*)    GFR, Estimated 56 (*)    All other components within normal limits  BLOOD GAS, VENOUS - Abnormal; Notable for the following components:   pCO2, Ven 66 (*)     Bicarbonate 32.5 (*)    Acid-Base Excess 4.0 (*)    All other components within normal limits  BRAIN NATRIURETIC PEPTIDE  TROPONIN I (HIGH SENSITIVITY)  TROPONIN I (HIGH SENSITIVITY)     MDM  Patient was given nitroglycerin  on arrival.  Received aspirin with EMS.  Given significant wheezing on exam we will do a DuoNeb treatment and steroid.  Clinical Course as of 05/21/24 1131  Thu May 21, 2024  1122 Patient states that he is feeling much better.  No longer with chest pain or shortness of breath.  Prior cholecystectomy.  Patient is currently on BiPAP.  States that he does not want to be admitted to the hospital and wants to go home.  States that he has to take care of his adult son who is disabled.  Discussed at length with the patient that I did not recommend him going home I recommended him staying for treatment of COPD exacerbation that is requiring BiPAP and heart failure exacerbation.  Patient understood the risk of going home.  States that he would return if his symptoms worsened.  Will start the patient on a course of antibiotics and steroids.  Discussed increasing his Lasix dose and following closely with the heart failure clinic and his cardiologist.  Encouraged follow-up with pulmonology.  Discussed at length that he could return at any time for reevaluation. [SM]    Clinical Course User Index [SM] Viviano Ground, MD     PROCEDURES:  Critical Care performed: yes  .Ultrasound ED Peripheral IV (Provider)  Date/Time: 05/21/2024 9:24 AM  Performed by: Viviano Ground, MD Authorized by: Viviano Ground, MD   Procedure details:    Indications: multiple failed IV attempts     Skin Prep: chlorhexidine  gluconate     Location:  Right AC   Angiocath:  20 G   Bedside Ultrasound Guided: Yes     Images: not archived     Patient tolerated procedure without complications: Yes     Dressing applied: Yes   .Critical Care  Performed by: Viviano Ground, MD Authorized by: Viviano Ground,  MD   Critical care provider statement:    Critical care time (minutes):  30   Critical care time was exclusive of:  Separately billable procedures and treating other patients   Critical care was necessary to treat or prevent imminent or life-threatening deterioration of the following conditions:  Respiratory failure   Critical care was time spent personally by me on the following activities:  Development of treatment plan with patient or surrogate, discussions with consultants, evaluation of patient's response to treatment, examination of patient, ordering and review of laboratory studies, ordering and review of radiographic studies, ordering and performing treatments and interventions, pulse oximetry, re-evaluation of patient's condition and review of old charts   Patient's presentation is most consistent with acute presentation with potential threat to life or bodily function.   MEDICATIONS ORDERED IN ED: Medications  nitroGLYCERIN  (NITROSTAT ) SL tablet 0.4 mg (0.4 mg Sublingual Given 05/21/24 0932)  furosemide (LASIX) injection 40 mg (has no administration in time range)  methylPREDNISolone  sodium succinate (SOLU-MEDROL ) 125 mg/2 mL injection 125 mg (has no administration in time range)  morphine  (PF) 4 MG/ML injection 4 mg (has no administration in time range)  ipratropium-albuterol  (DUONEB) 0.5-2.5 (3) MG/3ML nebulizer solution 6 mL (6 mLs Nebulization Given 05/21/24 0906)    FINAL CLINICAL IMPRESSION(S) / ED DIAGNOSES   Final diagnoses:  Chest pain, unspecified type  COPD exacerbation (HCC)  Acute on chronic congestive heart failure, unspecified heart failure type (HCC)     Rx / DC Orders   ED Discharge Orders          Ordered    predniSONE  (DELTASONE ) 50 MG tablet  Daily with breakfast        05/21/24 1126    amoxicillin -clavulanate (AUGMENTIN ) 875-125 MG tablet  2 times daily        05/21/24 1126    albuterol  (VENTOLIN  HFA) 108 (90 Base) MCG/ACT inhaler  Every 6 hours PRN         05/21/24 1126             Note:  This document was prepared using Dragon voice recognition software and may include unintentional dictation errors.   Viviano Ground, MD 05/21/24 1131

## 2024-05-22 LAB — BLOOD GAS, VENOUS
Acid-Base Excess: 4 mmol/L — ABNORMAL HIGH (ref 0.0–2.0)
Bicarbonate: 32.5 mmol/L — ABNORMAL HIGH (ref 20.0–28.0)
O2 Saturation: 28.7 %
Patient temperature: 37
pCO2, Ven: 66 mmHg — ABNORMAL HIGH (ref 44–60)
pH, Ven: 7.3 (ref 7.25–7.43)

## 2024-05-25 ENCOUNTER — Ambulatory Visit (INDEPENDENT_AMBULATORY_CARE_PROVIDER_SITE_OTHER): Admitting: Podiatry

## 2024-05-25 DIAGNOSIS — Z5982 Transportation insecurity: Secondary | ICD-10-CM

## 2024-05-25 NOTE — Progress Notes (Signed)
 1. Lack of access to transportation

## 2024-06-24 ENCOUNTER — Other Ambulatory Visit: Payer: Self-pay | Admitting: Family

## 2024-06-24 ENCOUNTER — Other Ambulatory Visit: Payer: Self-pay | Admitting: Cardiovascular Disease

## 2024-07-03 ENCOUNTER — Encounter: Payer: Self-pay | Admitting: Podiatry

## 2024-07-03 ENCOUNTER — Ambulatory Visit (INDEPENDENT_AMBULATORY_CARE_PROVIDER_SITE_OTHER): Admitting: Podiatry

## 2024-07-03 DIAGNOSIS — D689 Coagulation defect, unspecified: Secondary | ICD-10-CM

## 2024-07-03 DIAGNOSIS — B351 Tinea unguium: Secondary | ICD-10-CM

## 2024-07-03 DIAGNOSIS — L84 Corns and callosities: Secondary | ICD-10-CM | POA: Diagnosis not present

## 2024-07-03 DIAGNOSIS — M79609 Pain in unspecified limb: Secondary | ICD-10-CM | POA: Diagnosis not present

## 2024-07-06 NOTE — Progress Notes (Signed)
  Subjective:  Patient ID: Stanley Marks, male    DOB: Mar 15, 1941,  MRN: [id number]  [name] presents to clinic today for at risk foot care with h/o coagulation defect and callus(es) left great toe and painful thick toenails that are difficult to trim. Painful toenails interfere with ambulation. Aggravating factors include wearing enclosed shoe gear. Pain is relieved with periodic professional debridement. Painful calluses are aggravated when weightbearing with and without shoegear. Pain is relieved with periodic professional debridement.  Chief Complaint  Patient presents with   Nail Problem    Thick painful toenails, 3 month follow up    New problem(s): None.   PCP is Orlean Alan HERO, FNP.  Allergies  Allergen Reactions   Atorvastatin    Lipitor [Atorvastatin Calcium ] Other (See Comments)    arthralgia    Review of Systems: Negative except as noted in the HPI.  Objective: No changes noted in today's physical examination. There were no vitals filed for this visit. Stanley Marks is a pleasant 83 y.o. male obese in NAD. AAO x 3.  Vascular Examination: Capillary refill time immediate b/l. Faintly palpable pedal pulses b/l. Pedal hair absent. Trace edema noted BLE. Evidence of skin changes consistent with long term venous stasis BLE.  Neurological Examination: Sensation grossly intact b/l with 10 gram monofilament. Vibratory sensation intact b/l.   Dermatological Examination: Pedal skin with normal turgor, texture and tone b/l.  No open wounds. No interdigital macerations.   Toenails 1-5 b/l thick, discolored, elongated with subungual debris and pain on dorsal palpation.   Preulcerative lesion noted medial IPJ of left great toe. There is visible subdermal hemorrhage. There is no surrounding erythema, no edema, no drainage, no odor, no fluctuance.  Musculoskeletal Examination: Muscle strength 5/5 to all lower extremity muscle groups bilaterally. No pain, crepitus or joint  limitation noted with ROM b/l LE. No gross bony pedal deformities b/l. Patient ambulates independently without assistive aids.  Radiographs: None  Assessment/Plan: 1. Pain due to onychomycosis of nail   2. Pre-ulcerative calluses   3. 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 All patient's and/or POA's questions/concerns addressed on today's visit. Toenails 1-5 debrided in length and girth without incident. Preulcerative lesion(s) medial IPJ of left great toe pared with sharp debridement without incident. Continue foot and shoe inspections daily. Monitor blood glucose per PCP/Endocrinologist's recommendations.Continue soft, supportive shoe gear daily. Report any pedal injuries to medical professional. Call office if there are any quesitons/concerns.  Return in about 3 months (around 10/03/2024).  Delon LITTIE Merlin, DPM      [facility] LOCATION: [address] [place] [address]                   Office [phone]   [facility] LOCATION: [address] [place], [place] [address] Office [phone]

## 2024-08-31 ENCOUNTER — Ambulatory Visit: Admitting: Family

## 2024-10-02 ENCOUNTER — Ambulatory Visit: Admitting: Podiatry

## 2024-10-19 ENCOUNTER — Other Ambulatory Visit: Payer: Self-pay

## 2024-10-19 DIAGNOSIS — I2510 Atherosclerotic heart disease of native coronary artery without angina pectoris: Secondary | ICD-10-CM

## 2024-10-19 DIAGNOSIS — I10 Essential (primary) hypertension: Secondary | ICD-10-CM

## 2024-10-19 DIAGNOSIS — I5022 Chronic systolic (congestive) heart failure: Secondary | ICD-10-CM

## 2024-10-19 DIAGNOSIS — I739 Peripheral vascular disease, unspecified: Secondary | ICD-10-CM

## 2024-10-19 DIAGNOSIS — I482 Chronic atrial fibrillation, unspecified: Secondary | ICD-10-CM

## 2024-10-27 ENCOUNTER — Other Ambulatory Visit: Payer: Self-pay

## 2024-10-27 DIAGNOSIS — I482 Chronic atrial fibrillation, unspecified: Secondary | ICD-10-CM

## 2024-10-27 DIAGNOSIS — I739 Peripheral vascular disease, unspecified: Secondary | ICD-10-CM

## 2024-10-27 DIAGNOSIS — I10 Essential (primary) hypertension: Secondary | ICD-10-CM

## 2024-10-27 DIAGNOSIS — I5022 Chronic systolic (congestive) heart failure: Secondary | ICD-10-CM

## 2024-10-27 DIAGNOSIS — I2510 Atherosclerotic heart disease of native coronary artery without angina pectoris: Secondary | ICD-10-CM

## 2024-11-11 ENCOUNTER — Other Ambulatory Visit: Payer: Self-pay | Admitting: Family

## 2024-11-12 ENCOUNTER — Encounter: Payer: Self-pay | Admitting: Cardiovascular Disease

## 2024-11-12 ENCOUNTER — Ambulatory Visit: Admitting: Cardiovascular Disease

## 2024-11-12 ENCOUNTER — Other Ambulatory Visit: Payer: Self-pay

## 2024-11-12 VITALS — BP 142/83 | HR 60 | Ht 73.0 in | Wt 251.0 lb

## 2024-11-12 DIAGNOSIS — I739 Peripheral vascular disease, unspecified: Secondary | ICD-10-CM | POA: Diagnosis not present

## 2024-11-12 DIAGNOSIS — E782 Mixed hyperlipidemia: Secondary | ICD-10-CM

## 2024-11-12 DIAGNOSIS — R0602 Shortness of breath: Secondary | ICD-10-CM

## 2024-11-12 DIAGNOSIS — I482 Chronic atrial fibrillation, unspecified: Secondary | ICD-10-CM

## 2024-11-12 DIAGNOSIS — R296 Repeated falls: Secondary | ICD-10-CM | POA: Diagnosis not present

## 2024-11-12 DIAGNOSIS — I10 Essential (primary) hypertension: Secondary | ICD-10-CM

## 2024-11-12 DIAGNOSIS — I2510 Atherosclerotic heart disease of native coronary artery without angina pectoris: Secondary | ICD-10-CM

## 2024-11-12 DIAGNOSIS — I5022 Chronic systolic (congestive) heart failure: Secondary | ICD-10-CM

## 2024-11-12 MED ORDER — APIXABAN 5 MG PO TABS: 5.0000 mg | ORAL_TABLET | Freq: Two times a day (BID) | ORAL | 2 refills | Status: AC

## 2024-11-12 MED ORDER — METOPROLOL SUCCINATE ER 100 MG PO TB24: 100.0000 mg | ORAL_TABLET | Freq: Every day | ORAL | 0 refills | Status: AC

## 2024-11-12 NOTE — Progress Notes (Signed)
 Cardiology Office Note   Date:  11/12/2024   ID:  Stanley Marks, DOB December 19, 1940, MRN [id number]  PCP:  Orlean Alan HERO, FNP  Cardiologist:  Denyse Bathe, MD      History of Present Illness: Stanley Marks is a 83 y.o. male who presents for  Chief Complaint  Patient presents with   Follow-up    Follow up. Pt had 2 falls this week.     Had fallen twice and SOB.      Past Medical History:  Diagnosis Date   Acute calculous cholecystitis 07/20/2020   Acute respiratory failure due to COVID-19 [facility]) 12/22/2019   Acute respiratory failure with hypoxia (HCC) 12/22/2019   Aneurysm of infrarenal abdominal aorta    a.) measured 3.6 cm on 03/05/2019 CT; b.) measured 3.2 cm on 07/21/2020 MRI   Aortic atherosclerosis    Athscl heart disease of native coronary artery w/o ang pctrs 05/11/2019   Atrial fibrillation (HCC)    on Xarelto    Basal cell carcinoma 08/24/2014   chin, L nose ant alar crease   Bilateral renal cysts    CAD (coronary artery disease)    Cardiomegaly    CHF (congestive heart failure) (HCC)    Chronic anticoagulation    Rivaroxaban    CKD (chronic kidney disease)    COPD (chronic obstructive pulmonary disease) (HCC)    COPD exacerbation (HCC) 01/28/2017   Diverticulosis    Emphysema of lung (HCC)    Hepatic steatosis    History of 2019 novel coronavirus disease (COVID-19) 12/21/2019   Repeat testing on 01/22/2020 remained (+)   HLD (hyperlipidemia)    Hypertension    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   Obesity (BMI 30-39.9)    Obstructive sleep apnea    Pneumonia 01/28/2017   Pneumonia due to COVID-19 virus 01/22/2020   PVD (peripheral vascular disease)    Squamous cell carcinoma of skin 09/07/2014, 03/23/2020   L dorsal hand   Squamous cell carcinoma of skin 01/17/2015   R prox lateral dorsum of hand   Wound of left leg      Past Surgical History:  Procedure Laterality Date   APPENDECTOMY     arm  surgery     ELECTROPHYSIOLOGIC STUDY N/A 11/20/2016   Procedure: CARDIOVERSION;  Surgeon: Denyse DELENA Bathe, MD;  Location: [facility] ORS;  Service: Cardiovascular;  Laterality: N/A;   IR CATHETER TUBE CHANGE  05/05/2021   IR CHOLANGIOGRAM EXISTING TUBE  08/18/2020   IR PERC CHOLECYSTOSTOMY  07/22/2020   IR REMOVAL BILIARY DRAIN  01/20/2021     Current Outpatient Medications  Medication Sig Dispense Refill   albuterol  (VENTOLIN  HFA) 108 (90 Base) MCG/ACT inhaler Inhale 2 puffs into the lungs every 6 (six) hours as needed for wheezing or shortness of breath. 8 g 2   amiodarone  (PACERONE ) 200 MG tablet TAKE 1 TABLET(200 MG) BY MOUTH DAILY 30 tablet 2   apixaban  (ELIQUIS ) 5 MG TABS tablet Take 1 tablet (5 mg total) by mouth 2 (two) times daily. 180 tablet 2   budeson-glycopyrrolate-formoterol  (BREZTRI  AEROSPHERE) 160-9-4.8 MCG/ACT AERO Inhale 2 puffs into the lungs in the morning and at bedtime. 32.1 g 3   ibuprofen  (ADVIL ) 800 MG tablet Take 1 tablet (800 mg total) by mouth every 8 (eight) hours as needed. 30 tablet 0   KLAYESTA  powder APPLY 1 APPLICATION EXTERNALLY AS DIRECTED TWICE DAILY 60 g 0   ondansetron  (ZOFRAN -ODT) 4 MG disintegrating tablet DISSOLVE 1  TABLET UNDER THE TONGUE THREE TIMES DAILY AS NEEDED FOR NAUSEA 90 tablet 1   sacubitril -valsartan  (ENTRESTO ) 49-51 MG Take 1 tablet by mouth 2 (two) times daily. 180 tablet 2   spironolactone  (ALDACTONE ) 25 MG tablet Take 25 mg by mouth daily as needed (fluid).     torsemide  (DEMADEX ) 20 MG tablet Take 1 tablet (20 mg total) by mouth 2 (two) times daily. 60 tablet 2   traMADol  (ULTRAM ) 50 MG tablet Take 50 mg by mouth 2 (two) times daily as needed for moderate pain.     metoprolol  succinate (TOPROL -XL) 100 MG 24 hr tablet Take 1 tablet (100 mg total) by mouth daily. 90 tablet 0   No current facility-administered medications for this visit.    Allergies:   Atorvastatin and Lipitor [atorvastatin calcium ]    Social History:   reports that he has  been smoking cigarettes. He started smoking about 67 years ago. He has a 67.9 pack-year smoking history. He has never used smokeless tobacco. He reports that he does not currently use alcohol. He reports that he does not use drugs.   Family History:  family history includes Heart attack in his father; Heart disease in his mother.    ROS:     Review of Systems  Constitutional: Negative.   HENT: Negative.    Eyes: Negative.   Respiratory: Negative.    Gastrointestinal: Negative.   Genitourinary: Negative.   Musculoskeletal: Negative.   Skin: Negative.   Neurological: Negative.   Endo/Heme/Allergies: Negative.   Psychiatric/Behavioral: Negative.    All other systems reviewed and are negative.     All other systems are reviewed and negative.    PHYSICAL EXAM: VS:  BP (!) 142/83   Pulse 60   Ht 6' 1 (1.854 m)   Wt 251 lb (113.9 kg)   SpO2 91%   BMI 33.12 kg/m  , BMI Body mass index is 33.12 kg/m. Last weight:  Wt Readings from Last 3 Encounters:  11/12/24 251 lb (113.9 kg)  05/21/24 250 lb (113.4 kg)  03/16/24 270 lb (122.5 kg)     Physical Exam Vitals reviewed.  Constitutional:      Appearance: Normal appearance. He is normal weight.  HENT:     Head: Normocephalic.     Nose: Nose normal.     Mouth/Throat:     Mouth: Mucous membranes are moist.  Eyes:     Pupils: Pupils are equal, round, and reactive to light.  Cardiovascular:     Rate and Rhythm: Normal rate and regular rhythm.     Pulses: Normal pulses.     Heart sounds: Normal heart sounds.  Pulmonary:     Effort: Pulmonary effort is normal.  Abdominal:     General: Abdomen is flat. Bowel sounds are normal.  Musculoskeletal:        General: Normal range of motion.     Cervical back: Normal range of motion.  Skin:    General: Skin is warm.  Neurological:     General: No focal deficit present.     Mental Status: He is alert.  Psychiatric:        Mood and Affect: Mood normal.       EKG:    Recent Labs: 05/21/2024: ALT 16; B Natriuretic Peptide 83.7; BUN 17; Creatinine, Ser 1.29; Hemoglobin 15.9; Platelets 191; Potassium 4.1; Sodium 140    Lipid Panel    Component Value Date/Time   TRIG 53 12/22/2019 0151      Other  studies Reviewed: Additional studies/ records that were reviewed today include:  Review of the above records demonstrates:       No data to display            ASSESSMENT AND PLAN:    ICD-10-CM   1. Falling episodes  R29.6 PCV ECHOCARDIOGRAM COMPLETE    metoprolol  succinate (TOPROL -XL) 100 MG 24 hr tablet   stumbled and fell, adcise echo    2. PVD (peripheral vascular disease)  I73.9 PCV ECHOCARDIOGRAM COMPLETE    metoprolol  succinate (TOPROL -XL) 100 MG 24 hr tablet    3. Essential hypertension  I10 PCV ECHOCARDIOGRAM COMPLETE    metoprolol  succinate (TOPROL -XL) 100 MG 24 hr tablet    4. Chronic systolic CHF (congestive heart failure) (HCC)  I50.22 PCV ECHOCARDIOGRAM COMPLETE    metoprolol  succinate (TOPROL -XL) 100 MG 24 hr tablet    5. Atrial fibrillation, chronic (HCC)  I48.20 PCV ECHOCARDIOGRAM COMPLETE    metoprolol  succinate (TOPROL -XL) 100 MG 24 hr tablet    6. SOB (shortness of breath)  R06.02 PCV ECHOCARDIOGRAM COMPLETE    metoprolol  succinate (TOPROL -XL) 100 MG 24 hr tablet    7. Mixed hyperlipidemia  E78.2 PCV ECHOCARDIOGRAM COMPLETE    metoprolol  succinate (TOPROL -XL) 100 MG 24 hr tablet    8. Morbid (severe) obesity due to excess calories (HCC)  E66.01 PCV ECHOCARDIOGRAM COMPLETE    metoprolol  succinate (TOPROL -XL) 100 MG 24 hr tablet       Problem List Items Addressed This Visit       Cardiovascular and Mediastinum   Chronic systolic CHF (congestive heart failure) (HCC)   Relevant Medications   metoprolol  succinate (TOPROL -XL) 100 MG 24 hr tablet   Other Relevant Orders   PCV ECHOCARDIOGRAM COMPLETE   Essential hypertension   Relevant Medications   metoprolol  succinate (TOPROL -XL) 100 MG 24 hr tablet   Other  Relevant Orders   PCV ECHOCARDIOGRAM COMPLETE   PVD (peripheral vascular disease)   Relevant Medications   metoprolol  succinate (TOPROL -XL) 100 MG 24 hr tablet   Other Relevant Orders   PCV ECHOCARDIOGRAM COMPLETE     Other   Morbid (severe) obesity due to excess calories (HCC)   Relevant Medications   metoprolol  succinate (TOPROL -XL) 100 MG 24 hr tablet   Other Relevant Orders   PCV ECHOCARDIOGRAM COMPLETE   Mixed hyperlipidemia   Relevant Medications   metoprolol  succinate (TOPROL -XL) 100 MG 24 hr tablet   Other Relevant Orders   PCV ECHOCARDIOGRAM COMPLETE   Other Visit Diagnoses       Falling episodes    -  Primary   stumbled and fell, adcise echo   Relevant Medications   metoprolol  succinate (TOPROL -XL) 100 MG 24 hr tablet   Other Relevant Orders   PCV ECHOCARDIOGRAM COMPLETE     Atrial fibrillation, chronic (HCC)       Relevant Medications   metoprolol  succinate (TOPROL -XL) 100 MG 24 hr tablet   Other Relevant Orders   PCV ECHOCARDIOGRAM COMPLETE     SOB (shortness of breath)       Relevant Medications   metoprolol  succinate (TOPROL -XL) 100 MG 24 hr tablet   Other Relevant Orders   PCV ECHOCARDIOGRAM COMPLETE          Disposition:   Return in about 2 months (around 01/13/2025) for echo and f/u.    Total time spent: 40 minutes  Signed,  Denyse Bathe, MD  11/12/2024 10:40 AM    [facility]

## 2024-11-12 NOTE — Telephone Encounter (Signed)
 Sent already.

## 2024-12-20 ENCOUNTER — Other Ambulatory Visit: Payer: Self-pay | Admitting: Cardiovascular Disease

## 2025-01-14 ENCOUNTER — Other Ambulatory Visit: Payer: Self-pay

## 2025-01-14 ENCOUNTER — Other Ambulatory Visit

## 2025-01-14 DIAGNOSIS — I482 Chronic atrial fibrillation, unspecified: Secondary | ICD-10-CM

## 2025-01-14 DIAGNOSIS — R296 Repeated falls: Secondary | ICD-10-CM

## 2025-01-14 DIAGNOSIS — I5022 Chronic systolic (congestive) heart failure: Secondary | ICD-10-CM

## 2025-01-14 DIAGNOSIS — I10 Essential (primary) hypertension: Secondary | ICD-10-CM

## 2025-01-14 DIAGNOSIS — I739 Peripheral vascular disease, unspecified: Secondary | ICD-10-CM

## 2025-01-14 DIAGNOSIS — R0602 Shortness of breath: Secondary | ICD-10-CM

## 2025-01-14 DIAGNOSIS — E782 Mixed hyperlipidemia: Secondary | ICD-10-CM

## 2025-01-14 MED ORDER — AMIODARONE HCL 200 MG PO TABS: 200.0000 mg | ORAL_TABLET | Freq: Every day | ORAL | 1 refills | Status: AC

## 2025-01-21 ENCOUNTER — Ambulatory Visit: Admitting: Cardiovascular Disease
